# Patient Record
Sex: Female | Born: 1965
Health system: Southern US, Community
[De-identification: ages and names within clinical notes are randomized; demographics above are authoritative.]

## PROBLEM LIST (undated history)

## (undated) DIAGNOSIS — M1990 Unspecified osteoarthritis, unspecified site: Secondary | ICD-10-CM

## (undated) DIAGNOSIS — E119 Type 2 diabetes mellitus without complications: Secondary | ICD-10-CM

## (undated) DIAGNOSIS — R0789 Other chest pain: Secondary | ICD-10-CM

## (undated) DIAGNOSIS — I10 Essential (primary) hypertension: Secondary | ICD-10-CM

## (undated) HISTORY — DX: Unspecified osteoarthritis, unspecified site: M19.90

## (undated) HISTORY — PX: OTHER SURGICAL HISTORY: SHX169

---

## 2007-06-23 ENCOUNTER — Other Ambulatory Visit: Admission: RE | Admit: 2007-06-23 | Discharge: 2007-06-23 | Payer: Self-pay | Admitting: Obstetrics and Gynecology

## 2007-07-29 ENCOUNTER — Emergency Department (HOSPITAL_COMMUNITY): Admission: EM | Admit: 2007-07-29 | Discharge: 2007-07-29 | Payer: Self-pay | Admitting: Emergency Medicine

## 2007-10-17 ENCOUNTER — Emergency Department (HOSPITAL_COMMUNITY): Admission: EM | Admit: 2007-10-17 | Discharge: 2007-10-17 | Payer: Self-pay | Admitting: Emergency Medicine

## 2008-12-21 ENCOUNTER — Inpatient Hospital Stay (HOSPITAL_COMMUNITY): Admission: EM | Admit: 2008-12-21 | Discharge: 2008-12-22 | Payer: Self-pay | Admitting: Emergency Medicine

## 2009-02-07 ENCOUNTER — Emergency Department (HOSPITAL_COMMUNITY): Admission: EM | Admit: 2009-02-07 | Discharge: 2009-02-07 | Payer: Self-pay | Admitting: Emergency Medicine

## 2009-04-01 ENCOUNTER — Emergency Department (HOSPITAL_COMMUNITY): Admission: EM | Admit: 2009-04-01 | Discharge: 2009-04-01 | Payer: Self-pay | Admitting: Emergency Medicine

## 2009-06-06 ENCOUNTER — Ambulatory Visit (HOSPITAL_COMMUNITY): Admission: RE | Admit: 2009-06-06 | Discharge: 2009-06-06 | Payer: Self-pay | Admitting: General Surgery

## 2009-06-20 ENCOUNTER — Ambulatory Visit (HOSPITAL_COMMUNITY): Admission: RE | Admit: 2009-06-20 | Discharge: 2009-06-20 | Payer: Self-pay | Admitting: General Surgery

## 2009-09-28 ENCOUNTER — Encounter: Admission: RE | Admit: 2009-09-28 | Discharge: 2009-09-28 | Payer: Self-pay | Admitting: General Surgery

## 2009-11-14 ENCOUNTER — Ambulatory Visit (HOSPITAL_COMMUNITY): Admission: RE | Admit: 2009-11-14 | Discharge: 2009-11-14 | Payer: Self-pay | Admitting: General Surgery

## 2009-11-21 ENCOUNTER — Ambulatory Visit: Payer: Self-pay | Admitting: Family Medicine

## 2010-05-21 ENCOUNTER — Encounter: Payer: Self-pay | Admitting: General Surgery

## 2010-08-01 LAB — POCT I-STAT, CHEM 8
BUN: 6 mg/dL (ref 6–23)
Calcium, Ion: 1.11 mmol/L — ABNORMAL LOW (ref 1.12–1.32)
Chloride: 98 mEq/L (ref 96–112)
Creatinine, Ser: 0.6 mg/dL (ref 0.4–1.2)
Potassium: 3.2 mEq/L — ABNORMAL LOW (ref 3.5–5.1)
Sodium: 137 mEq/L (ref 135–145)

## 2010-08-01 LAB — POCT CARDIAC MARKERS
CKMB, poc: <1 ng/mL — ABNORMAL LOW (ref 1.0–8.0)
Myoglobin, poc: 43.2 ng/mL (ref 12–200)
Troponin i, poc: <0.05 ng/mL (ref 0.00–0.09)

## 2010-08-01 LAB — GLUCOSE, CAPILLARY: Glucose-Capillary: 383 mg/dL — ABNORMAL HIGH (ref 70–99)

## 2010-08-05 LAB — CARDIAC PANEL(CRET KIN+CKTOT+MB+TROPI)
CK, MB: 1 ng/mL (ref 0.3–4.0)
Relative Index: 0.7 (ref 0.0–2.5)
Total CK: 149 U/L (ref 7–177)
Total CK: 163 U/L (ref 7–177)
Troponin I: 0.02 ng/mL (ref 0.00–0.06)

## 2010-08-05 LAB — GLUCOSE, CAPILLARY
Glucose-Capillary: 314 mg/dL — ABNORMAL HIGH (ref 70–99)
Glucose-Capillary: 327 mg/dL — ABNORMAL HIGH (ref 70–99)

## 2010-08-05 LAB — CBC
HCT: 42.2 % (ref 36.0–46.0)
MCV: 84.6 fL (ref 78.0–100.0)
RBC: 4.98 MIL/uL (ref 3.87–5.11)
RDW: 12.7 % (ref 11.5–15.5)
WBC: 8.1 10*3/uL (ref 4.0–10.5)

## 2010-08-05 LAB — MAGNESIUM: Magnesium: 1.6 mg/dL (ref 1.5–2.5)

## 2010-08-05 LAB — DIFFERENTIAL
Basophils Absolute: 0.1 10*3/uL (ref 0.0–0.1)
Basophils Relative: 1 % (ref 0–1)
Eosinophils Absolute: 0.1 10*3/uL (ref 0.0–0.7)
Eosinophils Relative: 1 % (ref 0–5)
Lymphocytes Relative: 29 % (ref 12–46)
Monocytes Relative: 7 % (ref 3–12)
Neutro Abs: 4.9 10*3/uL (ref 1.7–7.7)
Neutrophils Relative %: 62 % (ref 43–77)

## 2010-08-05 LAB — BASIC METABOLIC PANEL
CO2: 28 mEq/L (ref 19–32)
Calcium: 8.7 mg/dL (ref 8.4–10.5)
GFR calc Af Amer: >60 mL/min (ref >60)
GFR calc non Af Amer: >60 mL/min (ref >60)
Potassium: 2.7 mEq/L — CL (ref 3.5–5.1)
Sodium: 137 mEq/L (ref 135–145)

## 2010-08-05 LAB — URINALYSIS, ROUTINE W REFLEX MICROSCOPIC
Bilirubin Urine: NEGATIVE
Nitrite: NEGATIVE
Protein, ur: NEGATIVE mg/dL
Specific Gravity, Urine: 1.031 — ABNORMAL HIGH (ref 1.005–1.030)
pH: 6 (ref 5.0–8.0)

## 2010-08-05 LAB — URINE CULTURE

## 2010-08-05 LAB — URINE MICROSCOPIC-ADD ON

## 2010-08-05 LAB — POCT I-STAT, CHEM 8
BUN: 12 mg/dL (ref 6–23)
Calcium, Ion: 1.14 mmol/L (ref 1.12–1.32)
Creatinine, Ser: 0.9 mg/dL (ref 0.4–1.2)
Glucose, Bld: 341 mg/dL — ABNORMAL HIGH (ref 70–99)
HCT: 44 % (ref 36.0–46.0)

## 2010-08-05 LAB — BRAIN NATRIURETIC PEPTIDE: Pro B Natriuretic peptide (BNP): <30 pg/mL (ref 0.0–100.0)

## 2010-08-05 LAB — HEMOGLOBIN A1C: Mean Plasma Glucose: 269 mg/dL

## 2010-08-05 LAB — POCT CARDIAC MARKERS: Myoglobin, poc: 52.7 ng/mL (ref 12–200)

## 2010-09-12 NOTE — H&P (Signed)
NAME:  Latoya Cox, Latoya Cox NO.:  [id number]   MEDICAL RECORD NO.:  [id number]          PATIENT TYPE:  EMS   LOCATION:  ED                           FACILITY:  [facility]   PHYSICIAN:  Hollice Espy, M.D.DATE OF BIRTH:  [birth year]   DATE OF ADMISSION:  12/21/2008  DATE OF DISCHARGE:                              HISTORY & PHYSICAL   CHIEF COMPLAINT:  Right-sided chest pain.   HISTORY:  Ms. Philbert is a 45 year old African American female with a  history of hypertension and diabetes.  She awakened this morning around  6 a.m. with right-sided chest pain that she described as a pressure.  She acknowledges nausea during this episode, but denies vomiting,  dizziness, shortness of breath, any radiating pain.  She does note that  she gets some relief while in the sitting position versus lying down.  After about 30 minutes of this continuous pain in her chest, her husband  and she came to the emergency room.  She did get some morphine which  gave her good relief from the pain.  She also received a nitro which did  not seem to alleviate the pain at all.  We are asked, [facility],  to admit for evaluation and treatment of chest pain and hyperglycemia.  Her blood glucose on arrival was 341.   ALLERGIES:  NO KNOWN DRUG ALLERGIES.   PAST MEDICAL HISTORY:  1. Hypertension.  2. Diabetes type 2.   PAST SURGICAL HISTORY:  She had aneurysm clipping in 2000.   FAMILY HISTORY:  Mother is deceased at 65 years old with a history of  hypertension, diabetes and stroke.  Father deceased at age 74 with a  history of diabetes, hypertension and peripheral vascular disease.  She  has 2 living sisters, both have hypertension.  One living brother who  has diabetes.   SOCIAL HISTORY:  She is negative for tobacco and ETOH.  She is married  and lives with her husband and one 52-year-old daughter.   HOME MEDICATIONS:  1. Glimepiride 4 mg one tab q.a.m.  2. Clonidine 0.2 mg one tab  q.h.s.  3. HCTZ 25 mg one tab daily.  4. Onglyza 5 mg p.o. one tab daily.  5. Klor-Con M20 p.o. 1.5 tabs daily.  6. Bystolic 10 mg p.o. one tab daily.  7. Exforge 10/320/25 mg one tab daily.   REVIEW OF SYSTEMS:  GENERAL:  No fever.  No chills.  ENT:  Positive for  sinus congestion.  Negative for sore throat.  CV:  Positive for  occasional lower extremity edema.  Negative for palpitations.  See the  HPI for the remaining.  RESPIRATORY:  Negative shortness of breath.  Negative cough.  NEURO:  Negative headache.  Negative dizziness.  Negative visual disturbances.  Negative numbness or tingling of  extremities.  GI:  Negative for abdominal pain.  Negative diarrhea.  Negative constipation.  Negative blood in stool.  Positive for currently  on her menstrual cycle.  GU:  Negative dysuria.  No blood in the urine.  No frequency.  No urgency.  MUSCULOSKELETAL:  Negative for joint  pain.  Negative for muscle weakness.  PSYCH:  Negative for depression.  Negative anxiety.  SKIN:  No rashes or lesions.   LABORATORY DATA:  CBC with a white count of 8.1, hemoglobin 14.1,  hematocrit 42.2, platelets large amount, sodium 139, potassium 2.7,  chloride 98, BUN 12, creatinine 0.9, glucose 341.  UA was positive for  greater than 1,000 glucose, trace ketones, negative for nitrites, WBC 3-  6, RBC 11-12, many bacteria.  Negative pregnancy.  Myoglobin 52.7, CK-MB  less than 1.0, troponin 0.05.   DIAGNOSTICS:  Chest x-ray shows cardiomegaly and pulmonary venous  hypertension.   PHYSICAL EXAMINATION:  VITAL SIGNS:  Temperature 98.1, blood pressure  119/82, heart rate 83, respirations 20, pulse ox 100% on room air.  GENERAL APPEARANCE:  Well-nourished, pleasant in no acute distress.  HEENT:  Head is normocephalic, atraumatic.  CV:  Rate regular.  No gallop.  No murmur.  No lower extremity edema.  RESPIRATORY:  Nonlabored.  Clear to auscultation bilaterally with no  rales.  No wheezes.  NEURO:  Speech clear.   Facial symmetry.  Alert and oriented x3.  GI:  Obese abdomen, soft, nontender.  Positive bowel sounds x4.  MS:  Moves all extremities.  Negative joint swelling.  Full range of  motion.  SKIN:  Warm and dry with no lesions or rashes.  PSYCH:  Cooperative, pleasant, alert and oriented x3.   ASSESSMENT/PLAN:  1. Atypical chest pain.  Chest pain atypical, but with risk factors      and increased pulmonary hypertension.  We will admit to telemetry.      We will cycle enzymes q.8 h. x3.  We will check a BMP and a D-dimer      to rule out PE.  Continue oxygen and provide morphine p.r.n. for      pain as well as nitro.  2. Uncontrolled diabetes of unknown etiology.  No fever.  Negative      white count.  Question compliance with her home management.  We      will hydrate.  We will monitor and treat using the glycemic control      order set.  We will check an A1c.  Start Lantus 5 units      subcutaneous at bedtime.  Check her CBGs t.i.d. with meals.  Carb      modified diet.  Consider a diabetic consult.  We are going to start      metformin 500 as well as the oral agents from home.  3. Hypokalemia, probably secondary to her hypertension medications and      increased glucose level.  We will replete.  We will check a basic      metabolic panel in the morning.  We will hold her antihypertensives      for now.  4. Pulmonary hypertension.  Chest x-ray reveals pulmonary venous      hypertension.  We will check a BMP as well as a D-dimer to rule out      PE.  Her systolic blood pressure range currently is 113-98.  Her      diastolic blood pressure range is 82-58.  We will hold her      antihypertensive for now.     ______________________________  Toya Smothers, NP      Hollice Espy, M.D.  Electronically Signed   KB/MEDQ  D:  12/21/2008  T:  12/21/2008  Job:  [id number]

## 2010-10-18 ENCOUNTER — Emergency Department (HOSPITAL_COMMUNITY): Payer: PRIVATE HEALTH INSURANCE

## 2010-10-18 ENCOUNTER — Observation Stay (HOSPITAL_COMMUNITY)
Admission: EM | Admit: 2010-10-18 | Discharge: 2010-10-19 | Disposition: A | Discharge status: Home / Self Care | Payer: PRIVATE HEALTH INSURANCE | Attending: Internal Medicine | Admitting: Internal Medicine

## 2010-10-18 DIAGNOSIS — IMO0001 Reserved for inherently not codable concepts without codable children: Secondary | ICD-10-CM | POA: Insufficient documentation

## 2010-10-18 DIAGNOSIS — Z79899 Other long term (current) drug therapy: Secondary | ICD-10-CM | POA: Insufficient documentation

## 2010-10-18 DIAGNOSIS — I10 Essential (primary) hypertension: Secondary | ICD-10-CM | POA: Insufficient documentation

## 2010-10-18 DIAGNOSIS — R0789 Other chest pain: Principal | ICD-10-CM | POA: Insufficient documentation

## 2010-10-18 DIAGNOSIS — J309 Allergic rhinitis, unspecified: Secondary | ICD-10-CM | POA: Insufficient documentation

## 2010-10-18 DIAGNOSIS — E876 Hypokalemia: Secondary | ICD-10-CM | POA: Insufficient documentation

## 2010-10-18 DIAGNOSIS — E669 Obesity, unspecified: Secondary | ICD-10-CM | POA: Insufficient documentation

## 2010-10-18 DIAGNOSIS — J329 Chronic sinusitis, unspecified: Secondary | ICD-10-CM | POA: Insufficient documentation

## 2010-10-18 DIAGNOSIS — E785 Hyperlipidemia, unspecified: Secondary | ICD-10-CM | POA: Insufficient documentation

## 2010-10-18 DIAGNOSIS — K219 Gastro-esophageal reflux disease without esophagitis: Secondary | ICD-10-CM | POA: Insufficient documentation

## 2010-10-18 LAB — URINALYSIS, ROUTINE W REFLEX MICROSCOPIC
Hgb urine dipstick: NEGATIVE
Protein, ur: NEGATIVE mg/dL
Urobilinogen, UA: 1 mg/dL (ref 0.0–1.0)

## 2010-10-18 LAB — BASIC METABOLIC PANEL
Calcium: 8.8 mg/dL (ref 8.4–10.5)
GFR calc non Af Amer: >60 mL/min (ref >60)
Glucose, Bld: 247 mg/dL — ABNORMAL HIGH (ref 70–99)
Sodium: 137 mEq/L (ref 135–145)

## 2010-10-18 LAB — CK TOTAL AND CKMB (NOT AT ARMC)
CK, MB: 2.3 ng/mL (ref 0.3–4.0)
Relative Index: 1.2 (ref 0.0–2.5)
Relative Index: 1.3 (ref 0.0–2.5)

## 2010-10-18 LAB — URINE MICROSCOPIC-ADD ON

## 2010-10-18 LAB — CBC
HCT: 37.6 % (ref 36.0–46.0)
Hemoglobin: 12.7 g/dL (ref 12.0–15.0)
RBC: 4.65 MIL/uL (ref 3.87–5.11)
WBC: 9.6 10*3/uL (ref 4.0–10.5)

## 2010-10-18 LAB — TROPONIN I: Troponin I: <0.3 ng/mL (ref <0.30)

## 2010-10-18 LAB — POCT PREGNANCY, URINE: Preg Test, Ur: NEGATIVE

## 2010-10-19 LAB — CARDIAC PANEL(CRET KIN+CKTOT+MB+TROPI)
CK, MB: 2.2 ng/mL (ref 0.3–4.0)
Troponin I: <0.3 ng/mL (ref <0.30)

## 2010-10-19 LAB — COMPREHENSIVE METABOLIC PANEL
ALT: 17 U/L (ref 0–35)
AST: 20 U/L (ref 0–37)
Albumin: 3.2 g/dL — ABNORMAL LOW (ref 3.5–5.2)
CO2: 27 mEq/L (ref 19–32)
Calcium: 8.5 mg/dL (ref 8.4–10.5)
Sodium: 139 mEq/L (ref 135–145)
Total Protein: 6.6 g/dL (ref 6.0–8.3)

## 2010-10-19 LAB — LIPID PANEL
HDL: 29 mg/dL — ABNORMAL LOW (ref >39)
LDL Cholesterol: 79 mg/dL (ref 0–99)
Triglycerides: 128 mg/dL (ref <150)
VLDL: 26 mg/dL (ref 0–40)

## 2010-10-19 LAB — CBC
MCH: 27.7 pg (ref 26.0–34.0)
MCHC: 34.1 g/dL (ref 30.0–36.0)
Platelets: 234 10*3/uL (ref 150–400)
RBC: 4.8 MIL/uL (ref 3.87–5.11)

## 2010-10-19 NOTE — H&P (Signed)
NAME:  JOSEPHENE, MARRONE NO.:  [id number]  MEDICAL RECORD NO.:  [id number]  LOCATION:  WLED                         FACILITY:  [facility]  PHYSICIAN:  Rosanna Randy, MDDATE OF BIRTH:  1966-01-23  DATE OF ADMISSION:  10/18/2010 DATE OF DISCHARGE:                             HISTORY & PHYSICAL   PRIMARY CARE PHYSICIAN:  [facility].  CHIEF COMPLAINT:  Chest pain.  HISTORY OF PRESENT ILLNESS:  Ms. Riede is a 45 year old female with a past medical history significant for hypertension, uncontrolled diabetes, hyperlipidemia and obesity who came into the hospital complaining of chest pain.  The patient reported that for the last 3 days she had been complaining of a sinusitis, postnasal drip and also cold symptoms associated with cough which have been going on for the same amount of time.  She has been in severe sudden midsternal chest pain early this morning around 9:00 a.m. which was radiated to the right side of her chest without any diaphoresis, nausea, vomiting but associated with a tachycardia.  The patient's pain improved after she received aspirin and morphine given in the emergency department.  Chest x-ray that was also done demonstrated no acute cardiopulmonary process. [facility] was called in order to admit the patient due to her risk factors in order to have ACS rule out.  ALLERGIES:  There is no known drug allergies.  PAST MEDICAL HISTORY:  Significant for hypertension, diabetes, hyperlipidemia, obesity, seasonal allergic rhinitis, insomnia.  MEDICATIONS: 1. Fish oil over-the-counter 1 tablet by mouth daily. 2. Aspirin 81 mg by mouth daily. 3. Multivitamin 1 tablet by mouth daily. 4. Potassium 20 mEq 3 tablets by mouth daily. 5. Clonazepam 1 mg by mouth daily at bedtime as needed to fall asleep.  MEDICATIONS: 1. Nebivolol 10 mg by mouth 1 tablet daily. 2. Amlodipine/valsartan 10/320 one tablet by mouth daily. 3.  Saxagliptin 5 mg 1 tablet by mouth daily. 4. Lantus 20 units subcutaneously twice a day.  SOCIAL HISTORY:  The patient denies any tobacco, any alcohol, and any recreational drugs.  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  FAMILY HISTORY:  Significant for hypertension and diabetes.  REVIEW OF SYSTEMS:  Positive for sinus congestion and mild erythema on her throat that could be secondary to postnasal drip, otherwise, as already mentioned on HPI.  PHYSICAL EXAMINATION:  VITAL SIGNS:  Temperature of 97.5, blood pressure 145/103, heart rate 18, respiratory rate 16, oxygen saturation 98% on room air. GENERAL:  In general, the patient was lying in bed, in no acute distress, well nourished, well hydrated, overweight. HEAD AND FACE:  Normocephalic.  No trauma. EYES:  Normal appearance.  Extraocular muscles intact.  PERRLA.  No icterus.  No nystagmus. EARS, NOSE AND THROAT:  Mouth and pharynx were normal.  Uvula midline. There was no erythema.  No exudates.  Good dentition. NECK:  Supple.  Full range of motion.  No thyromegaly. CARDIOVASCULAR:  Regular rate and rhythm.  No murmurs, gallops or rubs. Normal S1, S2. RESPIRATORY SYSTEM:  Normal with clear breath  sounds bilaterally.  No rales, rhonchi, wheezes or rubs. ABDOMEN:  Soft, nontender, nondistended.  Positive bowel sounds. EXTREMITIES:  Normal.  No abnormalities.  No deformity.  Full range of motion.  Pulses were normal bilaterally.  There was no tenderness.  No edema, cyanosis or clubbing. NEUROLOGIC EXAM:  The patient was alert and oriented x3.  Cranial nerves II through XII intact.  Muscle strength 5/5 bilaterally.  Normal sensation.  Normal gait.  Normal finger-to-nose and heel-to-shin. SKIN:  Color was normal.  No rash, no petechiae.  There was no lymphadenopathy. PSYCH:   Appropriate.  PERTINENT LABORATORY DATA:  The patient has a CBC without differential that showed white blood cells of 9.6, hemoglobin 12.7, platelets 228. Cardiac markers demonstrated a CK total of 185, CK-MB 2.3 with a relative index of 1.2, troponin less than 0.30.  Pregnancy in urine was negative. BMET showed a sodium of 137, potassium 2.9, chloride 100, bicarb 26, glucose 247, BUN 8, creatinine 0.59.  Urinalysis was normal. Urine microscopy normal.  Second set demonstrated a CK of 175 with CK-MB of 2.2, relative index of 1.3, troponin less than 0.30.  The patient had a chest x-ray that demonstrated no active disease.  ASSESSMENT AND PLAN.: 1. Chest pain, atypical, at this point most likely secondary to     costochondritis versus gastroesophageal reflux disease.  Since     after I was interrogating the patient, she also brought some reflux     type of symptoms into the history saying that she had sometimes a     morning sensation.  This is not the first time that she had this     type of discomfort and previously she was evaluated and was found     not to have any acute heart problems.  Due to her risk factors,     nonetheless, we are going to admit the patient for observation on     telemetry.  We are going to cycle cardiac enzymes and EKG and     depending on results, we might need to get in touch with cardiology     for further risk stratification.  We are going to check a fasting     lipid profile and TSH.  We will we will go ahead and repeat also     the patient's hemoglobin A1c as part of the stratification process.     We are going to continue the patient's medication for blood     pressure.  We are going to start her on Protonix 40 mg by mouth     daily.  We are going to continue aspirin and we are going to put     her on a heart healthy diet.  We are going to use morphine 1 to 2     mg by mouth q.4 h as needed to control her pain.  We will also go     ahead and start  treatment for her sinusitis so we can actually     avoid further coughing and by that also helping with the chest     pain.  Further treatment of this problem depending on the patient's     evolution. 2. Hypertension.  At this moment we are going to continue the use of     amlodipine, valsartan and nebivolol.  Further adjustment depending     response of her blood pressure to this medication. 3. Hypokalemia.  We are going to check a magnesium level and we are  going to replete her potassium most likely secondary to the use of     her medications for blood pressure at home.  There is no history of     nausea, vomiting or any other abnormalities. 4. Hyperlipidemia.  We are going to check a fasting lipid profile.     Depending on results, we are going to start the patient if needed     on statins for now.  We are going to continue just her fish oil. 5. Sinusitis.  We are going to start the patient on loratadine 10 mg     by mouth daily. 6. Obesity.  We are going to recommend a low-calorie diet and we are     going to involve dietitian in order to provide further     instructions on how to help her losing some weight. 7. The patient's diabetes.  We are going to check hemoglobin A1c.  We     are going to continue her Lantus and at this moment we are going to     hold off on the Onglyza and metformin while using regular insulin     with a sliding scale just in case that the patient will not require     any contrast study and at the moment if she does not need it, then     we will go ahead and restart her home medications at discharge.     She will probably benefit of being full dose of metformin 1000 mg     by mouth twice a day but it is something that we are going to let     the patient's primary care physician to decide on. 8. For DVT prophylaxis, we are going to start the patient on Lovenox.     Rosanna Randy, MD     CEM/MEDQ  D:  10/18/2010  T:  10/18/2010  Job:   [id number]  cc:   [facility]  Electronically Signed by Vassie Loll MD on 10/19/2010 11:14:28 AM

## 2010-10-31 NOTE — Discharge Summary (Signed)
NAMERUCHA, WISSINGER NO.:  [id number]  MEDICAL RECORD NO.:  [id number]  LOCATION:  1444                         FACILITY:  [facility]  PHYSICIAN:  Erick Blinks, MD     DATE OF BIRTH:  [birth year]  DATE OF ADMISSION:  10/18/2010 DATE OF DISCHARGE:  10/19/2010                              DISCHARGE SUMMARY   PRIMARY CARE PHYSICIAN:  Dr. Evalee Jefferson, internal medicine.  ENDOCRINOLOGIST:  Dr. Katrinka Blazing.  DISCHARGE DIAGNOSES: 1. Atypical chest pain likely secondary to upper respiratory tract     viral syndrome. 2. Gastroesophageal reflux disease. 3. Hypertension. 4. Uncontrolled diabetes. 5. Obesity. 6. Allergic rhinitis. 7. Hypokalemia.  DISCHARGE MEDICATIONS: 1. Protonix 40 mg p.o. daily. 2. Loratadine 10 mg p.o. daily. 3. Lantus 30 units subcutaneous b.i.d. 4. Saxagliptin 5 mg 1 tablet p.o. daily. 5. Exforge 10/320 mg 1 tablet p.o. daily. 6. Bystolic 10 mg 1 tablet p.o. daily. 7. Clonazepam 1 mg p.o. q.h.s. p.r.n. 8. Potassium chloride 20 mEq 3 tablets p.o. daily. 9. Fish oil over-the-counter 1 tablet p.o. daily. 10.Multivitamins 1 tablet p.o. daily. 11.Aspirin 81 mg p.o. daily.  ADMISSION HISTORY:  This is a 45 year old African-American female, who presents to the Emergency Room with complaints of chest pain.  Patient described the postnasal drip cough and sinusitis since Saturday.  She had noticed onset of chest pain, which occurred at rest.  She noticed some more while coughing.  She was brought to the ER for evaluation and due to her risk factors was admitted for rule out ACS.  For further details, please refer the history and physical by Dr. Gwenlyn Perking.  HOSPITAL COURSE: 1. Chest pain:  Patient has no further chest pain while here in the     hospital.  Her cardiac enzymes were negative x3.  Her EKG does not     show any new changes, although it did show diffuse T-wave     inversions.  These are unchanged since 2011.  She has had no     further pain  while here in the hospital.  Chest x-ray does not show     any acute disease.  It is felt that her pain is almost likely     secondary to the viral pleuritis.  Recent upper respiratory tract     infection.  The patient also reports having a stress test within     the last 1-2 years which was reported to be normal.  It is felt     appropriate to discharge the patient at this time and have a follow-     up with primary care physician with possible referral to cardiology     is felt appropriate i.e., if symptoms recur. 2. Uncontrolled diabetes:  Patient does report some noncompliance with     her Lantus.  She does see an endocrinologist as an outpatient.     Patient was strongly advised to be compliant with her medication.     She will be set up with outpatient diabetes education.  Her A1c was     found to be elevated at 9.8.  We will increase her Lantus from 20     b.i.d. to 30 b.i.d. and  continue her oral hypoglycemics.  She would     also monitor her sugars at home and follow up with her     endocrinologist within the week. 3. The remainder of the patient's medical issues have remained stable.  DIAGNOSTIC IMAGING:  Chest x-ray from October 18, 2010 shows no active disease in one-view.  DISCHARGE INSTRUCTIONS:  The patient should follow up with her primary care physician in the next 1-2 weeks.  She will also need to see her endocrinologist in the next 1-2 weeks.  She has been advised to schedule this appointment.  She should continue on heart-healthy low-calorie diet and conduct her activity as tolerated.  CONDITION AT TIME OF DISCHARGE:  Improved.     Erick Blinks, MD     JM/MEDQ  D:  10/19/2010  T:  10/19/2010  Job:  [id number]  Electronically Signed by Durward Mallard MEMON  on 10/31/2010 05:43:20 PM

## 2012-01-16 ENCOUNTER — Emergency Department (HOSPITAL_COMMUNITY)
Admission: EM | Admit: 2012-01-16 | Discharge: 2012-01-16 | Disposition: A | Discharge status: Home / Self Care | Payer: PRIVATE HEALTH INSURANCE | Attending: Emergency Medicine | Admitting: Emergency Medicine

## 2012-01-16 ENCOUNTER — Emergency Department (HOSPITAL_COMMUNITY): Payer: PRIVATE HEALTH INSURANCE

## 2012-01-16 DIAGNOSIS — Z79899 Other long term (current) drug therapy: Secondary | ICD-10-CM | POA: Insufficient documentation

## 2012-01-16 DIAGNOSIS — R071 Chest pain on breathing: Secondary | ICD-10-CM | POA: Insufficient documentation

## 2012-01-16 DIAGNOSIS — R0789 Other chest pain: Secondary | ICD-10-CM

## 2012-01-16 DIAGNOSIS — Z7982 Long term (current) use of aspirin: Secondary | ICD-10-CM | POA: Insufficient documentation

## 2012-01-16 LAB — BASIC METABOLIC PANEL
CO2: 27 mEq/L (ref 19–32)
Calcium: 9.8 mg/dL (ref 8.4–10.5)
Creatinine, Ser: 0.66 mg/dL (ref 0.50–1.10)
Glucose, Bld: 159 mg/dL — ABNORMAL HIGH (ref 70–99)

## 2012-01-16 LAB — CBC
Hemoglobin: 14.6 g/dL (ref 12.0–15.0)
MCH: 27.8 pg (ref 26.0–34.0)
MCV: 81 fL (ref 78.0–100.0)
RBC: 5.26 MIL/uL — ABNORMAL HIGH (ref 3.87–5.11)

## 2012-01-16 MED ORDER — METHOCARBAMOL 750 MG PO TABS: 750.0000 mg | ORAL_TABLET | Freq: Four times a day (QID) | ORAL | Status: DC

## 2012-01-16 MED ORDER — DIAZEPAM 5 MG PO TABS
5.0000 mg | ORAL_TABLET | Freq: Once | ORAL | Status: AC
  Administered 2012-01-16: 5 mg via ORAL
  Filled 2012-01-16: qty 1

## 2012-01-16 MED ORDER — KETOROLAC TROMETHAMINE 60 MG/2ML IM SOLN
60.0000 mg | Freq: Once | INTRAMUSCULAR | Status: AC
  Administered 2012-01-16: 60 mg via INTRAMUSCULAR
  Filled 2012-01-16: qty 2

## 2012-01-16 MED ORDER — IBUPROFEN 600 MG PO TABS: 600.0000 mg | ORAL_TABLET | Freq: Four times a day (QID) | ORAL | Status: DC | PRN

## 2012-01-16 NOTE — ED Provider Notes (Signed)
History     CSN: [id number]  Arrival date & time 01/16/12  1639   First MD Initiated Contact with Patient 01/16/12 1853      Chief Complaint  Patient presents with  . Chest Pain    (Consider location/radiation/quality/duration/timing/severity/associated sxs/prior treatment) Patient is a 46 y.o. female presenting with chest pain. The history is provided by the patient and the spouse.  Chest Pain    patient here with right-sided chest pain that began today which is described as sharp and worse with certain movements. Denies any fever or cough. Denies any history of trauma. No leg pain or swelling. Denies any history of PE. Pain is nonanginal. No medications taken for this prior to arrival. The pain is better without moving  No past medical history on file.  No past surgical history on file.  No family history on file.  History  Substance Use Topics  . Smoking status: Not on file  . Smokeless tobacco: Not on file  . Alcohol Use: Not on file    OB History    No data available      Review of Systems  Cardiovascular: Positive for chest pain.  All other systems reviewed and are negative.    Allergies  Review of patient's allergies indicates no known allergies.  Home Medications   Current Outpatient Rx  Name Route Sig Dispense Refill  . AMLODIPINE BESYLATE-VALSARTAN 10-320 MG PO TABS Oral Take 1 tablet by mouth daily.    . ASPIRIN 81 MG PO CHEW Oral Chew 81 mg by mouth daily.    Marland Kitchen CLONIDINE HCL 0.1 MG PO TABS Oral Take 0.1 mg by mouth at bedtime.    . OMEGA-3 FATTY ACIDS 1000 MG PO CAPS Oral Take 1 g by mouth daily.    . INSULIN GLARGINE 100 UNIT/ML [place] SOLN Subcutaneous Inject 35 Units into the skin at bedtime.    Marland Kitchen LIRAGLUTIDE 18 MG/3ML  SOLN Subcutaneous Inject 1.8 mg into the skin daily.    Marland Kitchen LORATADINE 10 MG PO TABS Oral Take 10 mg by mouth daily.    Marland Kitchen METFORMIN HCL ER 750 MG PO TB24 Oral Take 750 mg by mouth daily with breakfast.    . ADULT MULTIVITAMIN  W/MINERALS CH Oral Take 1 tablet by mouth daily.    . NEBIVOLOL HCL 10 MG PO TABS Oral Take 10 mg by mouth daily.    . NEBIVOLOL HCL 20 MG PO TABS Oral Take 20 mg by mouth daily.    Marland Kitchen POTASSIUM CHLORIDE 20 MEQ PO PACK Oral Take 80 mEq by mouth daily.      BP 134/102  Pulse 92  Temp 97.8 F (36.6 C) (Oral)  Resp 16  SpO2 97%  LMP 01/15/2012  Physical Exam  Nursing note and vitals reviewed. Constitutional: She is oriented to person, place, and time. She appears well-developed and well-nourished.  Non-toxic appearance. No distress.  HENT:  Head: Normocephalic and atraumatic.  Eyes: Conjunctivae normal, EOM and lids are normal. Pupils are equal, round, and reactive to light.  Neck: Normal range of motion. Neck supple. No tracheal deviation present. No mass present.  Cardiovascular: Normal rate, regular rhythm and normal heart sounds.  Exam reveals no gallop.   No murmur heard. Pulmonary/Chest: Effort normal and breath sounds normal. No stridor. No respiratory distress. She has no decreased breath sounds. She has no wheezes. She has no rhonchi. She has no rales.  Abdominal: Soft. Normal appearance and bowel sounds are normal. She exhibits no distension. There  is no tenderness. There is no rebound and no CVA tenderness.  Musculoskeletal: Normal range of motion. She exhibits no edema and no tenderness.  Neurological: She is alert and oriented to person, place, and time. She has normal strength. No cranial nerve deficit or sensory deficit. GCS eye subscore is 4. GCS verbal subscore is 5. GCS motor subscore is 6.  Skin: Skin is warm and dry. No abrasion and no rash noted.  Psychiatric: She has a normal mood and affect. Her speech is normal and behavior is normal.    ED Course  Procedures (including critical care time)   Labs Reviewed  POCT I-STAT TROPONIN I  CBC  BASIC METABOLIC PANEL   Dg Chest 2 View  01/16/2012  *RADIOLOGY REPORT*  Clinical Data: Chest pain  CHEST - 2 VIEW   Comparison: 10/18/2010  Findings: The heart size and mediastinal contours are within normal limits.  Both lungs are clear.  The visualized skeletal structures are unremarkable.  IMPRESSION: Negative examination.   Original Report Authenticated By: Rosealee Albee, M.D.      No diagnosis found.    MDM   Date: 01/16/2012  Rate: 85  Rhythm: normal sinus rhythm  QRS Axis: normal  Intervals: normal  ST/T Wave abnormalities: nonspecific T wave changes  Conduction Disutrbances:none  Narrative Interpretation:   Old EKG Reviewed: unchanged  Pt is PERC negative  8:01 PM Patient given medications here feels better. No concern for patient having ACS. Suspect chest wall pain and she is now stable for discharge to      Toy Baker, MD 01/16/12 2002

## 2012-01-16 NOTE — ED Notes (Signed)
Report sudden onset of chest pain started 1400 today, pin 9/10 non radiating with nausea, but no vomiting nor SOB. Pain is worsen at touch and exacerbate by movement. Pt in no acute distress, no diaphoresis noted. Sinus rhythm noted on Monitor. EKG obtained, will show to MD

## 2013-05-25 ENCOUNTER — Emergency Department (HOSPITAL_COMMUNITY): Payer: PRIVATE HEALTH INSURANCE

## 2013-05-25 ENCOUNTER — Emergency Department (HOSPITAL_COMMUNITY)
Admission: EM | Admit: 2013-05-25 | Discharge: 2013-05-25 | Disposition: A | Discharge status: Home / Self Care | Payer: PRIVATE HEALTH INSURANCE | Attending: Emergency Medicine | Admitting: Emergency Medicine

## 2013-05-25 ENCOUNTER — Encounter (HOSPITAL_COMMUNITY): Payer: Self-pay | Admitting: Emergency Medicine

## 2013-05-25 DIAGNOSIS — R0789 Other chest pain: Secondary | ICD-10-CM | POA: Insufficient documentation

## 2013-05-25 DIAGNOSIS — Z7982 Long term (current) use of aspirin: Secondary | ICD-10-CM | POA: Insufficient documentation

## 2013-05-25 DIAGNOSIS — IMO0002 Reserved for concepts with insufficient information to code with codable children: Secondary | ICD-10-CM | POA: Insufficient documentation

## 2013-05-25 DIAGNOSIS — I10 Essential (primary) hypertension: Secondary | ICD-10-CM | POA: Insufficient documentation

## 2013-05-25 DIAGNOSIS — E119 Type 2 diabetes mellitus without complications: Secondary | ICD-10-CM | POA: Insufficient documentation

## 2013-05-25 DIAGNOSIS — Z79899 Other long term (current) drug therapy: Secondary | ICD-10-CM | POA: Insufficient documentation

## 2013-05-25 DIAGNOSIS — Z794 Long term (current) use of insulin: Secondary | ICD-10-CM | POA: Insufficient documentation

## 2013-05-25 HISTORY — DX: Essential (primary) hypertension: I10

## 2013-05-25 HISTORY — DX: Other chest pain: R07.89

## 2013-05-25 HISTORY — DX: Type 2 diabetes mellitus without complications: E11.9

## 2013-05-25 LAB — CBC
HEMATOCRIT: 39.9 % (ref 36.0–46.0)
Hemoglobin: 13.4 g/dL (ref 12.0–15.0)
MCH: 27.6 pg (ref 26.0–34.0)
MCHC: 33.6 g/dL (ref 30.0–36.0)
MCV: 82.3 fL (ref 78.0–100.0)
Platelets: 208 10*3/uL (ref 150–400)
RBC: 4.85 MIL/uL (ref 3.87–5.11)
RDW: 13.4 % (ref 11.5–15.5)
WBC: 8.9 10*3/uL (ref 4.0–10.5)

## 2013-05-25 LAB — BASIC METABOLIC PANEL
BUN: 9 mg/dL (ref 6–23)
CO2: 22 mEq/L (ref 19–32)
CREATININE: 0.65 mg/dL (ref 0.50–1.10)
Calcium: 8.9 mg/dL (ref 8.4–10.5)
Chloride: 102 mEq/L (ref 96–112)
Glucose, Bld: 194 mg/dL — ABNORMAL HIGH (ref 70–99)
Potassium: 4.2 mEq/L (ref 3.7–5.3)
Sodium: 137 mEq/L (ref 137–147)

## 2013-05-25 LAB — POCT I-STAT TROPONIN I
Troponin i, poc: 0 ng/mL (ref 0.00–0.08)
Troponin i, poc: 0 ng/mL (ref 0.00–0.08)

## 2013-05-25 MED ORDER — KETOROLAC TROMETHAMINE 30 MG/ML IJ SOLN
60.0000 mg | Freq: Once | INTRAMUSCULAR | Status: AC
  Administered 2013-05-25: 60 mg via INTRAMUSCULAR
  Filled 2013-05-25: qty 2

## 2013-05-25 NOTE — ED Provider Notes (Signed)
TIME SEEN: 8:10 PM  CHIEF COMPLAINT: Chest pain  HPI: Patient is a 48 year old female with a hypertension, diabetes who presents emergency room with right-sided sharp chest pain that radiates into the central chest that started this morning at 11 AM. She states that her symptoms may be due to stress. She denies any lightheadedness, shortness of breath, diaphoresis, nausea or vomiting, fevers or cough.  She states she's had a prior stress test several years ago which was negative. She denies a prior history of PE or DVT, lower extremity swelling or pain, exogenous hormone use, tobacco use, recent prolonged immobilization such as long flight or hospitalization, fracture, surgery, trauma. She states her chest pain comes on for several seconds and then will resolve spontaneously.  ROS: See HPI Constitutional: no fever  Eyes: no drainage  ENT: no runny nose   Cardiovascular:  chest pain  Resp: no SOB  GI: no vomiting GU: no dysuria Integumentary: no rash  Allergy: no hives  Musculoskeletal: no leg swelling  Neurological: no slurred speech ROS otherwise negative  PAST MEDICAL HISTORY/PAST SURGICAL HISTORY:  Past Medical History  Diagnosis Date  . Chest wall pain   . Diabetes mellitus without complication   . Hypertension     MEDICATIONS:  Prior to Admission medications   Medication Sig Start Date End Date Taking? Authorizing Provider  amLODipine-valsartan (EXFORGE) 10-320 MG per tablet Take 1 tablet by mouth daily.   Yes Historical Provider, MD  aspirin 81 MG chewable tablet Chew 81 mg by mouth daily.   Yes Historical Provider, MD  Canagliflozin (INVOKANA) 100 MG TABS Take 100 mg by mouth daily.   Yes Historical Provider, MD  cloNIDine (CATAPRES) 0.1 MG tablet Take 0.1 mg by mouth at bedtime.   Yes Historical Provider, MD  fluticasone (FLONASE) 50 MCG/ACT nasal spray Place 1 spray into both nostrils daily.   Yes Historical Provider, MD  insulin glargine (LANTUS) 100 UNIT/ML injection  Inject 35 Units into the skin at bedtime.   Yes Historical Provider, MD  Liraglutide (VICTOZA) 18 MG/3ML SOLN Inject 1.8 mg into the skin daily.   Yes Historical Provider, MD  metFORMIN (GLUCOPHAGE) 500 MG tablet Take 750 mg by mouth 2 (two) times daily with a meal.   Yes Historical Provider, MD  nebivolol (BYSTOLIC) 10 MG tablet Take 30 mg by mouth daily.    Yes Historical Provider, MD  omeprazole (PRILOSEC) 20 MG capsule Take 20 mg by mouth daily.   Yes Historical Provider, MD  potassium chloride SA (K-DUR,KLOR-CON) 20 MEQ tablet Take 80 mEq by mouth daily.   Yes Historical Provider, MD    ALLERGIES:  Allergies  Allergen Reactions  . Hydrocodone Itching    SOCIAL HISTORY:  History  Substance Use Topics  . Smoking status: Never Smoker   . Smokeless tobacco: Never Used  . Alcohol Use: No    FAMILY HISTORY: History reviewed. No pertinent family history.  EXAM: BP 143/100  Pulse 78  Temp(Src) 98.2 F (36.8 C) (Oral)  Resp 11  SpO2 100% CONSTITUTIONAL: Alert and oriented and responds appropriately to questions. Well-appearing; well-nourished HEAD: Normocephalic EYES: Conjunctivae clear, PERRL ENT: normal nose; no rhinorrhea; moist mucous membranes; pharynx without lesions noted NECK: Supple, no meningismus, no LAD  CARD: RRR; S1 and S2 appreciated; no murmurs, no clicks, no rubs, no gallops RESP: Normal chest excursion without splinting or tachypnea; breath sounds clear and equal bilaterally; no wheezes, no rhonchi, no rales,  ABD/GI: Normal bowel sounds; non-distended; soft, non-tender, no rebound, no  guarding BACK:  The back appears normal and is non-tender to palpation, there is no CVA tenderness EXT: Normal ROM in all joints; non-tender to palpation; no edema; normal capillary refill; no cyanosis    SKIN: Normal color for age and race; warm NEURO: Moves all extremities equally PSYCH: The patient's mood and manner are appropriate. Grooming and personal hygiene are  appropriate.  MEDICAL DECISION MAKING: Patient here with very atypical chest pain. She does have some risk factors for ACS but given her chest pain only lasts for several seconds and then resolves, I am not concerned for any life-threatening illness. She has no risk factors for pulmonary embolus. She is hemodynamically stable. Labs are unremarkable. First troponin is negative. EKG shows some ischemic changes but is unchanged compared to her prior EKG in June 2012. Will repeat troponin and add on chest x-ray. Anticipate discharge home. Will give Toradol.  ED PROGRESS: Patient reports feeling better after Toradol. Suspect this is chest wall pain. Her repeat troponin is negative. We'll discharge him with return precautions, PCP followup. Patient verbalizes understanding is comfortable plan.    EKG Interpretation    Date/Time:  Monday May 25 2013 17:26:40 EST Ventricular Rate:  78 PR Interval:  148 QRS Duration: 91 QT Interval:  426 QTC Calculation: 485 R Axis:   -13 Text Interpretation:  Sinus rhythm Atrial premature complexes LVH with secondary repolarization abnormality Probable anterior infarct, age indeterminate No changed compared to June 2012 Confirmed by WARD  DO, KRISTEN [phone]) on 05/25/2013 5:31:45 PM             Layla MawKristen N Ward, DO 05/25/13 2204

## 2013-05-25 NOTE — Progress Notes (Signed)
   CARE MANAGEMENT ED NOTE 05/25/2013  [facility]  [facility]   Account Number:  [id number]  Date Initiated:  05/25/2013  Documentation initiated by:  Radford PaxFERRERO,Veretta Sabourin  Subjective/Objective Assessment:   Patient presents to Ed with right sided sharp chest pain     Subjective/Objective Assessment Detail:   Patient with pmhx of diabetes and HTN.     Action/Plan:   Action/Plan Detail:   Anticipated DC Date:  05/25/2013     Status Recommendation to Physician:   Result of Recommendation:    Other ED Services  Consult Working Plan    DC Planning Services  Other  PCP issues    Choice offered to / List presented to:            Status of service:  Completed, signed off  ED Comments:   ED Comments Detail:  Patient reports her pcp is Dr. Gwenlyn PerkingMargie Trent.  System updated.

## 2013-05-25 NOTE — Discharge Instructions (Signed)
Chest Pain (Nonspecific) °It is often hard to give a specific diagnosis for the cause of chest pain. There is always a chance that your pain could be related to something serious, such as a heart attack or a blood clot in the lungs. You need to follow up with your caregiver for further evaluation. °CAUSES  °· Heartburn. °· Pneumonia or bronchitis. °· Anxiety or stress. °· Inflammation around your heart (pericarditis) or lung (pleuritis or pleurisy). °· A blood clot in the lung. °· A collapsed lung (pneumothorax). It can develop suddenly on its own (spontaneous pneumothorax) or from injury (trauma) to the chest. °· Shingles infection (herpes zoster virus). °The chest wall is composed of bones, muscles, and cartilage. Any of these can be the source of the pain. °· The bones can be bruised by injury. °· The muscles or cartilage can be strained by coughing or overwork. °· The cartilage can be affected by inflammation and become sore (costochondritis). °DIAGNOSIS  °Lab tests or other studies, such as X-rays, electrocardiography, stress testing, or cardiac imaging, may be needed to find the cause of your pain.  °TREATMENT  °· Treatment depends on what may be causing your chest pain. Treatment may include: °· Acid blockers for heartburn. °· Anti-inflammatory medicine. °· Pain medicine for inflammatory conditions. °· Antibiotics if an infection is present. °· You may be advised to change lifestyle habits. This includes stopping smoking and avoiding alcohol, caffeine, and chocolate. °· You may be advised to keep your head raised (elevated) when sleeping. This reduces the chance of acid going backward from your stomach into your esophagus. °· Most of the time, nonspecific chest pain will improve within 2 to 3 days with rest and mild pain medicine. °HOME CARE INSTRUCTIONS  °· If antibiotics were prescribed, take your antibiotics as directed. Finish them even if you start to feel better. °· For the next few days, avoid physical  activities that bring on chest pain. Continue physical activities as directed. °· Do not smoke. °· Avoid drinking alcohol. °· Only take over-the-counter or prescription medicine for pain, discomfort, or fever as directed by your caregiver. °· Follow your caregiver's suggestions for further testing if your chest pain does not go away. °· Keep any follow-up appointments you made. If you do not go to an appointment, you could develop lasting (chronic) problems with pain. If there is any problem keeping an appointment, you must call to reschedule. °SEEK MEDICAL CARE IF:  °· You think you are having problems from the medicine you are taking. Read your medicine instructions carefully. °· Your chest pain does not go away, even after treatment. °· You develop a rash with blisters on your chest. °SEEK IMMEDIATE MEDICAL CARE IF:  °· You have increased chest pain or pain that spreads to your arm, neck, jaw, back, or abdomen. °· You develop shortness of breath, an increasing cough, or you are coughing up blood. °· You have severe back or abdominal pain, feel nauseous, or vomit. °· You develop severe weakness, fainting, or chills. °· You have a fever. °THIS IS AN EMERGENCY. Do not wait to see if the pain will go away. Get medical help at once. Call your local emergency services (911 in [place] Do not drive yourself to the hospital. °MAKE SURE YOU:  °· Understand these instructions. °· Will watch your condition. °· Will get help right away if you are not doing well or get worse. °Document Released: 01/24/2005 Document Revised: 07/09/2011 Document Reviewed: 11/20/2007 °ExitCare® Patient Information ©2014 [employer],   LLC. ° °Chest Wall Pain °Chest wall pain is pain in or around the bones and muscles of your chest. It may take up to 6 weeks to get better. It may take longer if you must stay physically active in your work and activities.  °CAUSES  °Chest wall pain may happen on its own. However, it may be caused by: °· A viral illness  like the flu. °· Injury. °· Coughing. °· Exercise. °· Arthritis. °· Fibromyalgia. °· Shingles. °HOME CARE INSTRUCTIONS  °· Avoid overtiring physical activity. Try not to strain or perform activities that cause pain. This includes any activities using your chest or your abdominal and side muscles, especially if heavy weights are used. °· Put ice on the sore area. °· Put ice in a plastic bag. °· Place a towel between your skin and the bag. °· Leave the ice on for 15-20 minutes per hour while awake for the first 2 days. °· Only take over-the-counter or prescription medicines for pain, discomfort, or fever as directed by your caregiver. °SEEK IMMEDIATE MEDICAL CARE IF:  °· Your pain increases, or you are very uncomfortable. °· You have a fever. °· Your chest pain becomes worse. °· You have new, unexplained symptoms. °· You have nausea or vomiting. °· You feel sweaty or lightheaded. °· You have a cough with phlegm (sputum), or you cough up blood. °MAKE SURE YOU:  °· Understand these instructions. °· Will watch your condition. °· Will get help right away if you are not doing well or get worse. °Document Released: 04/16/2005 Document Revised: 07/09/2011 Document Reviewed: 12/11/2010 °ExitCare® Patient Information ©2014 [employer] LLC. ° °

## 2013-05-25 NOTE — ED Notes (Addendum)
Pt c/o intermittent R side chest pain that has radiated into central chest.  Pt sts this episode started this morning.  Sts "I was stressed this weekend."  Pain score 6/10.  Hx of intermittent chest wall pain, HTN, and DM.

## 2013-11-02 ENCOUNTER — Encounter (HOSPITAL_COMMUNITY): Payer: Self-pay | Admitting: Emergency Medicine

## 2013-11-02 ENCOUNTER — Emergency Department (HOSPITAL_COMMUNITY)
Admission: EM | Admit: 2013-11-02 | Discharge: 2013-11-03 | Disposition: A | Discharge status: Home / Self Care | Payer: PRIVATE HEALTH INSURANCE | Attending: Emergency Medicine | Admitting: Emergency Medicine

## 2013-11-02 DIAGNOSIS — R1084 Generalized abdominal pain: Secondary | ICD-10-CM | POA: Insufficient documentation

## 2013-11-02 DIAGNOSIS — I10 Essential (primary) hypertension: Secondary | ICD-10-CM | POA: Insufficient documentation

## 2013-11-02 DIAGNOSIS — Z7982 Long term (current) use of aspirin: Secondary | ICD-10-CM | POA: Insufficient documentation

## 2013-11-02 DIAGNOSIS — Z3202 Encounter for pregnancy test, result negative: Secondary | ICD-10-CM | POA: Insufficient documentation

## 2013-11-02 DIAGNOSIS — IMO0002 Reserved for concepts with insufficient information to code with codable children: Secondary | ICD-10-CM | POA: Insufficient documentation

## 2013-11-02 DIAGNOSIS — Z79899 Other long term (current) drug therapy: Secondary | ICD-10-CM | POA: Insufficient documentation

## 2013-11-02 DIAGNOSIS — E119 Type 2 diabetes mellitus without complications: Secondary | ICD-10-CM | POA: Insufficient documentation

## 2013-11-02 DIAGNOSIS — R112 Nausea with vomiting, unspecified: Secondary | ICD-10-CM

## 2013-11-02 MED ORDER — ONDANSETRON HCL 4 MG/2ML IJ SOLN
4.0000 mg | Freq: Once | INTRAMUSCULAR | Status: AC
Start: 2013-11-03 — End: 2013-11-03
  Administered 2013-11-03: 4 mg via INTRAVENOUS
  Filled 2013-11-02: qty 2

## 2013-11-02 NOTE — ED Notes (Signed)
[facility]. States she began feeling ill after that. Patient states she is unable to tolerate POs and has had two episodes of emesis.

## 2013-11-03 LAB — URINALYSIS, ROUTINE W REFLEX MICROSCOPIC
BILIRUBIN URINE: NEGATIVE
Hgb urine dipstick: NEGATIVE
KETONES UR: 40 mg/dL — AB
Leukocytes, UA: NEGATIVE
Nitrite: NEGATIVE
PROTEIN: 30 mg/dL — AB
Specific Gravity, Urine: 1.04 — ABNORMAL HIGH (ref 1.005–1.030)
UROBILINOGEN UA: 0.2 mg/dL (ref 0.0–1.0)
pH: 7 (ref 5.0–8.0)

## 2013-11-03 LAB — CBC WITH DIFFERENTIAL/PLATELET
BASOS PCT: 1 % (ref 0–1)
Basophils Absolute: 0.1 10*3/uL (ref 0.0–0.1)
Eosinophils Absolute: 0.2 10*3/uL (ref 0.0–0.7)
Eosinophils Relative: 1 % (ref 0–5)
HEMATOCRIT: 45.3 % (ref 36.0–46.0)
HEMOGLOBIN: 16 g/dL — AB (ref 12.0–15.0)
LYMPHS ABS: 3.3 10*3/uL (ref 0.7–4.0)
Lymphocytes Relative: 29 % (ref 12–46)
MCH: 28.3 pg (ref 26.0–34.0)
MCHC: 35.3 g/dL (ref 30.0–36.0)
MCV: 80.2 fL (ref 78.0–100.0)
MONO ABS: 0.8 10*3/uL (ref 0.1–1.0)
MONOS PCT: 7 % (ref 3–12)
NEUTROS ABS: 7.1 10*3/uL (ref 1.7–7.7)
Neutrophils Relative %: 62 % (ref 43–77)
Platelets: 228 10*3/uL (ref 150–400)
RBC: 5.65 MIL/uL — ABNORMAL HIGH (ref 3.87–5.11)
RDW: 13.5 % (ref 11.5–15.5)
WBC: 11.4 10*3/uL — ABNORMAL HIGH (ref 4.0–10.5)

## 2013-11-03 LAB — URINE MICROSCOPIC-ADD ON

## 2013-11-03 LAB — COMPREHENSIVE METABOLIC PANEL
ALBUMIN: 4.3 g/dL (ref 3.5–5.2)
ALT: 13 U/L (ref 0–35)
ANION GAP: 16 — AB (ref 5–15)
AST: 17 U/L (ref 0–37)
Alkaline Phosphatase: 99 U/L (ref 39–117)
BILIRUBIN TOTAL: 0.9 mg/dL (ref 0.3–1.2)
BUN: 6 mg/dL (ref 6–23)
CHLORIDE: 100 meq/L (ref 96–112)
CO2: 26 mEq/L (ref 19–32)
CREATININE: 0.66 mg/dL (ref 0.50–1.10)
Calcium: 9.5 mg/dL (ref 8.4–10.5)
GLUCOSE: 192 mg/dL — AB (ref 70–99)
Potassium: 3.2 mEq/L — ABNORMAL LOW (ref 3.7–5.3)
Sodium: 142 mEq/L (ref 137–147)
Total Protein: 8 g/dL (ref 6.0–8.3)

## 2013-11-03 LAB — POC URINE PREG, ED: Preg Test, Ur: NEGATIVE

## 2013-11-03 LAB — LIPASE, BLOOD: LIPASE: 43 U/L (ref 11–59)

## 2013-11-03 MED ORDER — PROMETHAZINE HCL 25 MG PO TABS: 25.0000 mg | ORAL_TABLET | Freq: Four times a day (QID) | ORAL | Status: DC | PRN

## 2013-11-03 MED ORDER — POTASSIUM CHLORIDE CRYS ER 20 MEQ PO TBCR
40.0000 meq | EXTENDED_RELEASE_TABLET | Freq: Once | ORAL | Status: AC
Start: 2013-11-03 — End: 2013-11-03
  Administered 2013-11-03: 40 meq via ORAL
  Filled 2013-11-03: qty 2

## 2013-11-03 MED ORDER — SODIUM CHLORIDE 0.9 % IV BOLUS (SEPSIS)
1000.0000 mL | Freq: Once | INTRAVENOUS | Status: AC
  Administered 2013-11-03: 1000 mL via INTRAVENOUS

## 2013-11-03 MED ORDER — PROMETHAZINE HCL 25 MG/ML IJ SOLN
12.5000 mg | Freq: Once | INTRAMUSCULAR | Status: AC
  Administered 2013-11-03: 12.5 mg via INTRAVENOUS
  Filled 2013-11-03: qty 1

## 2013-11-03 NOTE — ED Provider Notes (Signed)
Medical screening examination/treatment/procedure(s) were performed by non-physician practitioner and as supervising physician I was immediately available for consultation/collaboration.   EKG Interpretation None        Ericberto Padget L Antoria Lanza, MD 11/03/13 0424 

## 2013-11-03 NOTE — Discharge Instructions (Signed)
Take Phenergan as needed for persistent symptoms. Refrain from eating spicy, greasy, or fatty foods as well as milk products until symptoms resolve. Followup with your primary doctor to ensure resolution of symptoms.  Nausea and Vomiting Nausea is a sick feeling that often comes before throwing up (vomiting). Vomiting is a reflex where stomach contents come out of your mouth. Vomiting can cause severe loss of body fluids (dehydration). Children and elderly adults can become dehydrated quickly, especially if they also have diarrhea. Nausea and vomiting are symptoms of a condition or disease. It is important to find the cause of your symptoms. CAUSES   Direct irritation of the stomach lining. This irritation can result from increased acid production (gastroesophageal reflux disease), infection, food poisoning, taking certain medicines (such as nonsteroidal anti-inflammatory drugs), alcohol use, or tobacco use.  Signals from the brain.These signals could be caused by a headache, heat exposure, an inner ear disturbance, increased pressure in the brain from injury, infection, a tumor, or a concussion, pain, emotional stimulus, or metabolic problems.  An obstruction in the gastrointestinal tract (bowel obstruction).  Illnesses such as diabetes, hepatitis, gallbladder problems, appendicitis, kidney problems, cancer, sepsis, atypical symptoms of a heart attack, or eating disorders.  Medical treatments such as chemotherapy and radiation.  Receiving medicine that makes you sleep (general anesthetic) during surgery. DIAGNOSIS Your caregiver may ask for tests to be done if the problems do not improve after a few days. Tests may also be done if symptoms are severe or if the reason for the nausea and vomiting is not clear. Tests may include:  Urine tests.  Blood tests.  Stool tests.  Cultures (to look for evidence of infection).  X-rays or other imaging studies. Test results can help your caregiver  make decisions about treatment or the need for additional tests. TREATMENT You need to stay well hydrated. Drink frequently but in small amounts.You may wish to drink water, sports drinks, clear broth, or eat frozen ice pops or gelatin dessert to help stay hydrated.When you eat, eating slowly may help prevent nausea.There are also some antinausea medicines that may help prevent nausea. HOME CARE INSTRUCTIONS   Take all medicine as directed by your caregiver.  If you do not have an appetite, do not force yourself to eat. However, you must continue to drink fluids.  If you have an appetite, eat a normal diet unless your caregiver tells you differently.  Eat a variety of complex carbohydrates (rice, wheat, potatoes, bread), lean meats, yogurt, fruits, and vegetables.  Avoid high-fat foods because they are more difficult to digest.  Drink enough water and fluids to keep your urine clear or pale yellow.  If you are dehydrated, ask your caregiver for specific rehydration instructions. Signs of dehydration may include:  Severe thirst.  Dry lips and mouth.  Dizziness.  Dark urine.  Decreasing urine frequency and amount.  Confusion.  Rapid breathing or pulse. SEEK IMMEDIATE MEDICAL CARE IF:   You have blood or brown flecks (like coffee grounds) in your vomit.  You have black or bloody stools.  You have a severe headache or stiff neck.  You are confused.  You have severe abdominal pain.  You have chest pain or trouble breathing.  You do not urinate at least once every 8 hours.  You develop cold or clammy skin.  You continue to vomit for longer than 24 to 48 hours.  You have a fever. MAKE SURE YOU:   Understand these instructions.  Will watch your condition.  Will get help right away if you are not doing well or get worse. Document Released: 04/16/2005 Document Revised: 07/09/2011 Document Reviewed: 09/13/2010 [facility] Patient Information 2015 [employer] [place].  This information is not intended to replace advice given to you by your health care provider. Make sure you discuss any questions you have with your health care provider.

## 2013-11-03 NOTE — ED Notes (Signed)
Pt given water for fluid challenge 

## 2013-11-03 NOTE — ED Provider Notes (Signed)
CSN: [id number]     Arrival date & time 11/02/13  2347 History   First MD Initiated Contact with Patient 11/03/13 0012     Chief Complaint  Patient presents with  . Nausea and Vomiting     (Consider location/radiation/quality/duration/timing/severity/associated sxs/prior Treatment) HPI Comments: Patient is a 48 year old female with a history of diabetes mellitus, hypertension, and chest wall pain who presents to the emergency department today for nausea and vomiting. Patient states that she ate a [employer] quarter pounder with cheese at 1500 and, approximately one hour later, began to feel nauseous. Patient has had 2 episodes of emesis since this time which have been nonbloody. Symptoms associated with generalized abdominal cramping. She endorses a bowel movement in the last 24 hours and denies history of abdominal surgeries. No associated fever, syncope, chest pain, shortness of breath, urinary symptoms, vaginal complaints, or numbness/weakness.  The history is provided by the patient. No language interpreter was used.    Past Medical History  Diagnosis Date  . Chest wall pain   . Diabetes mellitus without complication   . Hypertension    History reviewed. No pertinent past surgical history. No family history on file. History  Substance Use Topics  . Smoking status: Never Smoker   . Smokeless tobacco: Never Used  . Alcohol Use: No   OB History   Grav Para Term Preterm Abortions TAB SAB Ect Mult Living                  Review of Systems  Constitutional: Negative for fever.  Respiratory: Negative for shortness of breath.   Cardiovascular: Negative for chest pain.  Gastrointestinal: Positive for nausea, vomiting and abdominal pain (Generalized cramping).  Genitourinary: Negative for dysuria and hematuria.  Neurological: Negative for syncope.  All other systems reviewed and are negative.    Allergies  Hydrocodone  Home Medications   Prior to Admission medications    Medication Sig Start Date End Date Taking? Authorizing Provider  amLODipine-valsartan (EXFORGE) 10-320 MG per tablet Take 1 tablet by mouth daily.   Yes Historical Provider, MD  aspirin 81 MG chewable tablet Chew 81 mg by mouth daily.   Yes Historical Provider, MD  Canagliflozin (INVOKANA) 100 MG TABS Take 100 mg by mouth daily.   Yes Historical Provider, MD  cloNIDine (CATAPRES) 0.1 MG tablet Take 0.1 mg by mouth at bedtime.   Yes Historical Provider, MD  fluticasone (FLONASE) 50 MCG/ACT nasal spray Place 1 spray into both nostrils daily.   Yes Historical Provider, MD  insulin glargine (LANTUS) 100 UNIT/ML injection Inject 35 Units into the skin at bedtime.   Yes Historical Provider, MD  Liraglutide (VICTOZA) 18 MG/3ML SOLN Inject 1.8 mg into the skin every morning.    Yes Historical Provider, MD  metFORMIN (GLUCOPHAGE) 500 MG tablet Take 750 mg by mouth 2 (two) times daily with a meal.   Yes Historical Provider, MD  nebivolol (BYSTOLIC) 10 MG tablet Take 30 mg by mouth daily.    Yes Historical Provider, MD  omeprazole (PRILOSEC) 20 MG capsule Take 20 mg by mouth 2 (two) times daily.    Yes Historical Provider, MD  potassium chloride SA (K-DUR,KLOR-CON) 20 MEQ tablet Take 80 mEq by mouth daily.   Yes Historical Provider, MD  promethazine (PHENERGAN) 25 MG tablet Take 1 tablet (25 mg total) by mouth every 6 (six) hours as needed for nausea or vomiting. 11/03/13   Antony Madura, PA-C   BP 116/74  Pulse 106  Temp(Src) 97.9 F (  36.6 C) (Oral)  Resp 18  Ht 5\' 4"  (1.626 m)  Wt 180 lb (81.647 kg)  BMI 30.88 kg/m2  SpO2 97%  Physical Exam  Nursing note and vitals reviewed. Constitutional: She is oriented to person, place, and time. She appears well-developed and well-nourished. No distress.  Nontoxic/nonseptic appearing  HENT:  Head: Normocephalic and atraumatic.  Eyes: Conjunctivae and EOM are normal. No scleral icterus.  Neck: Normal range of motion.  Cardiovascular: Normal rate, regular  rhythm and normal heart sounds.   Pulmonary/Chest: Effort normal and breath sounds normal. No respiratory distress. She has no wheezes. She has no rales.  Chest expansion symmetric  Abdominal: Soft. There is no tenderness. There is no rebound and no guarding.  Soft and nontender. No masses. No peritoneal signs.  Musculoskeletal: Normal range of motion.  Neurological: She is alert and oriented to person, place, and time.   GCS 15. Patient moves extremities without ataxia.  Skin: Skin is warm and dry. No rash noted. She is not diaphoretic. No erythema. No pallor.  Psychiatric: She has a normal mood and affect. Her behavior is normal.    ED Course  Procedures (including critical care time) Labs Review Labs Reviewed  CBC WITH DIFFERENTIAL - Abnormal; Notable for the following:    WBC 11.4 (*)    RBC 5.65 (*)    Hemoglobin 16.0 (*)    All other components within normal limits  COMPREHENSIVE METABOLIC PANEL - Abnormal; Notable for the following:    Potassium 3.2 (*)    Glucose, Bld 192 (*)    Anion gap 16 (*)    All other components within normal limits  URINALYSIS, ROUTINE W REFLEX MICROSCOPIC - Abnormal; Notable for the following:    APPearance TURBID (*)    Specific Gravity, Urine 1.040 (*)    Glucose, UA >1000 (*)    Ketones, ur 40 (*)    Protein, ur 30 (*)    All other components within normal limits  URINE MICROSCOPIC-ADD ON - Abnormal; Notable for the following:    Squamous Epithelial / LPF FEW (*)    All other components within normal limits  LIPASE, BLOOD  POC URINE PREG, ED    Imaging Review No results found.   EKG Interpretation None      MDM   Final diagnoses:  Nausea and vomiting, vomiting of unspecified type    48 year old female presents to the emergency department for nausea and vomiting, onset after eating a quarter pounder with cheese at [employer]. Patient nontoxic and nonseptic appearing, hemodynamically stable, and afebrile on arrival. Abdomen soft  without focal tenderness or peritoneal signs. No masses appreciated.  Labs today significant for leukocytosis of 11.4 and hypokalemia of 3.2. Patient given oral potassium in ED for this. Symptoms managed with Zofran, Phenergan, and fluids. Patient has been able to tolerate medication and fluids by mouth without emesis. Abdominal reexaminations stable. Doubt pSBO or SBO given recent BM and lack of abdominal tenderness. Doubt cholecystitis given lack of RUQ TTP and elevated LFTs. Lipase WNL.  Suspect symptoms today to be secondary to food intake. Patient stable and appropriate for discharge with prescription for antiemetics and instruction to followup with her primary care provider to ensure a solution of symptoms. Return precautions provided and patient agreeable to plan with no unaddressed concerns.   Filed Vitals:   11/02/13 2353 11/03/13 0200  BP: 127/100 116/74  Pulse: 97 106  Temp: 98.3 F (36.8 C) 97.9 F (36.6 C)  TempSrc: Oral Oral  Resp: 18 18  Height: 5\' 4"  (1.626 m)   Weight: 180 lb (81.647 kg)   SpO2: 99% 97%      Antony MaduraKelly Cerinity Zynda, PA-C 11/03/13 0210

## 2014-09-04 ENCOUNTER — Emergency Department (HOSPITAL_COMMUNITY)
Admission: EM | Admit: 2014-09-04 | Discharge: 2014-09-04 | Disposition: A | Discharge status: Home / Self Care | Payer: No Typology Code available for payment source | Attending: Emergency Medicine | Admitting: Emergency Medicine

## 2014-09-04 ENCOUNTER — Encounter (HOSPITAL_COMMUNITY): Payer: Self-pay | Admitting: Emergency Medicine

## 2014-09-04 DIAGNOSIS — Z794 Long term (current) use of insulin: Secondary | ICD-10-CM | POA: Insufficient documentation

## 2014-09-04 DIAGNOSIS — Z7982 Long term (current) use of aspirin: Secondary | ICD-10-CM | POA: Insufficient documentation

## 2014-09-04 DIAGNOSIS — R1110 Vomiting, unspecified: Secondary | ICD-10-CM | POA: Diagnosis present

## 2014-09-04 DIAGNOSIS — I10 Essential (primary) hypertension: Secondary | ICD-10-CM | POA: Insufficient documentation

## 2014-09-04 DIAGNOSIS — Z79899 Other long term (current) drug therapy: Secondary | ICD-10-CM | POA: Insufficient documentation

## 2014-09-04 DIAGNOSIS — K529 Noninfective gastroenteritis and colitis, unspecified: Secondary | ICD-10-CM | POA: Diagnosis not present

## 2014-09-04 DIAGNOSIS — E119 Type 2 diabetes mellitus without complications: Secondary | ICD-10-CM | POA: Insufficient documentation

## 2014-09-04 DIAGNOSIS — Z7951 Long term (current) use of inhaled steroids: Secondary | ICD-10-CM | POA: Insufficient documentation

## 2014-09-04 LAB — COMPREHENSIVE METABOLIC PANEL
ALT: 21 U/L (ref 14–54)
AST: 23 U/L (ref 15–41)
Albumin: 4.3 g/dL (ref 3.5–5.0)
Alkaline Phosphatase: 78 U/L (ref 38–126)
Anion gap: 13 (ref 5–15)
BUN: 11 mg/dL (ref 6–20)
CO2: 25 mmol/L (ref 22–32)
Calcium: 9.3 mg/dL (ref 8.9–10.3)
Chloride: 101 mmol/L (ref 101–111)
Creatinine, Ser: 0.92 mg/dL (ref 0.44–1.00)
GFR calc Af Amer: >60 mL/min (ref >60)
GLUCOSE: 173 mg/dL — AB (ref 70–99)
Potassium: 3.1 mmol/L — ABNORMAL LOW (ref 3.5–5.1)
SODIUM: 139 mmol/L (ref 135–145)
Total Bilirubin: 1.7 mg/dL — ABNORMAL HIGH (ref 0.3–1.2)
Total Protein: 7.7 g/dL (ref 6.5–8.1)

## 2014-09-04 LAB — CBC WITH DIFFERENTIAL/PLATELET
BASOS PCT: 1 % (ref 0–1)
Basophils Absolute: 0.1 10*3/uL (ref 0.0–0.1)
EOS ABS: 0.1 10*3/uL (ref 0.0–0.7)
EOS PCT: 1 % (ref 0–5)
HEMATOCRIT: 47 % — AB (ref 36.0–46.0)
HEMOGLOBIN: 15.8 g/dL — AB (ref 12.0–15.0)
LYMPHS ABS: 3.1 10*3/uL (ref 0.7–4.0)
Lymphocytes Relative: 31 % (ref 12–46)
MCH: 27.2 pg (ref 26.0–34.0)
MCHC: 33.6 g/dL (ref 30.0–36.0)
MCV: 80.9 fL (ref 78.0–100.0)
MONO ABS: 0.5 10*3/uL (ref 0.1–1.0)
MONOS PCT: 5 % (ref 3–12)
Neutro Abs: 6.2 10*3/uL (ref 1.7–7.7)
Neutrophils Relative %: 62 % (ref 43–77)
Platelets: 204 10*3/uL (ref 150–400)
RBC: 5.81 MIL/uL — AB (ref 3.87–5.11)
RDW: 13.6 % (ref 11.5–15.5)
WBC: 10 10*3/uL (ref 4.0–10.5)

## 2014-09-04 LAB — URINALYSIS, ROUTINE W REFLEX MICROSCOPIC
BILIRUBIN URINE: NEGATIVE
Glucose, UA: >1000 mg/dL — AB
Hgb urine dipstick: NEGATIVE
KETONES UR: 40 mg/dL — AB
NITRITE: NEGATIVE
Protein, ur: NEGATIVE mg/dL
Specific Gravity, Urine: 1.014 (ref 1.005–1.030)
UROBILINOGEN UA: 0.2 mg/dL (ref 0.0–1.0)
pH: 6.5 (ref 5.0–8.0)

## 2014-09-04 LAB — URINE MICROSCOPIC-ADD ON

## 2014-09-04 LAB — LIPASE, BLOOD: Lipase: 37 U/L (ref 22–51)

## 2014-09-04 MED ORDER — SODIUM CHLORIDE 0.9 % IV BOLUS (SEPSIS)
1000.0000 mL | Freq: Once | INTRAVENOUS | Status: AC
  Administered 2014-09-04: 1000 mL via INTRAVENOUS

## 2014-09-04 MED ORDER — POTASSIUM CHLORIDE CRYS ER 20 MEQ PO TBCR
40.0000 meq | EXTENDED_RELEASE_TABLET | Freq: Once | ORAL | Status: AC
  Administered 2014-09-04: 40 meq via ORAL
  Filled 2014-09-04: qty 2

## 2014-09-04 MED ORDER — ONDANSETRON HCL 4 MG/2ML IJ SOLN
4.0000 mg | Freq: Once | INTRAMUSCULAR | Status: AC
  Administered 2014-09-04: 4 mg via INTRAVENOUS
  Filled 2014-09-04: qty 2

## 2014-09-04 NOTE — ED Provider Notes (Signed)
CSN: [id number]     Arrival date & time 09/04/14  1347 History   First MD Initiated Contact with Patient 09/04/14 1539     Chief Complaint  Patient presents with  . Emesis  . Abdominal Pain     (Consider location/radiation/quality/duration/timing/severity/associated sxs/prior Treatment) HPI..... Multiple episodes of vomiting since 8 PM last night. No fever, diarrhea, dysuria, dyspnea, chest pain. Patient has diabetes and has been taking her medication. She is ambulatory without neurological deficits. She feels slightly dehydrated. Severity is moderate. Nothing makes symptoms better or worse.  Past Medical History  Diagnosis Date  . Chest wall pain   . Diabetes mellitus without complication   . Hypertension    History reviewed. No pertinent past surgical history. No family history on file. History  Substance Use Topics  . Smoking status: Never Smoker   . Smokeless tobacco: Never Used  . Alcohol Use: No   OB History    No data available     Review of Systems  All other systems reviewed and are negative.     Allergies  Hydrocodone  Home Medications   Prior to Admission medications   Medication Sig Start Date End Date Taking? Authorizing Provider  amLODipine-valsartan (EXFORGE) 10-320 MG per tablet Take 1 tablet by mouth daily.   Yes Historical Provider, MD  aspirin 81 MG chewable tablet Chew 81 mg by mouth daily.   Yes Historical Provider, MD  Canagliflozin (INVOKANA) 100 MG TABS Take 100 mg by mouth daily.   Yes Historical Provider, MD  cloNIDine (CATAPRES) 0.1 MG tablet Take 0.1 mg by mouth at bedtime.   Yes Historical Provider, MD  Diclofenac 35 MG CAPS Take 35 mg by mouth 3 (three) times daily as needed (for pain).   Yes Historical Provider, MD  fluticasone (FLONASE) 50 MCG/ACT nasal spray Place 1 spray into both nostrils daily.   Yes Historical Provider, MD  insulin glargine (LANTUS) 100 UNIT/ML injection Inject 38 Units into the skin at bedtime.    Yes Historical  Provider, MD  Liraglutide (VICTOZA) 18 MG/3ML SOLN Inject 1.2-1.8 mg into the skin 2 (two) times daily. Takes 1.8mg  in am and 1.2 in pm   Yes Historical Provider, MD  metFORMIN (GLUCOPHAGE) 500 MG tablet Take 750 mg by mouth daily with breakfast.    Yes Historical Provider, MD  nebivolol (BYSTOLIC) 10 MG tablet Take 30 mg by mouth daily.    Yes Historical Provider, MD  omeprazole (PRILOSEC) 40 MG capsule Take 40 mg by mouth 2 (two) times daily. 08/30/14  Yes Historical Provider, MD  ondansetron (ZOFRAN) 8 MG tablet Take 8 mg by mouth daily as needed for nausea or vomiting.  08/30/14  Yes Historical Provider, MD  potassium chloride SA (K-DUR,KLOR-CON) 20 MEQ tablet Take 80 mEq by mouth daily.   Yes Historical Provider, MD  Vitamin D, Ergocalciferol, (DRISDOL) 50000 UNITS CAPS capsule Take 50,000 Units by mouth every Monday. 08/23/14  Yes Historical Provider, MD   BP 126/64 mmHg  Pulse 95  Temp(Src) 98.2 F (36.8 C) (Oral)  Resp 22  Ht 5\' 3"  (1.6 m)  Wt 195 lb (88.451 kg)  BMI 34.55 kg/m2  SpO2 99%  LMP 05/06/2014 Physical Exam  Constitutional: She is oriented to person, place, and time.  Slightly dehydrated  HENT:  Head: Normocephalic and atraumatic.  Eyes: Conjunctivae and EOM are normal. Pupils are equal, round, and reactive to light.  Neck: Normal range of motion. Neck supple.  Cardiovascular: Normal rate and regular rhythm.  Pulmonary/Chest: Effort normal and breath sounds normal.  Abdominal: Soft. Bowel sounds are normal.  Nontender abdomen  Musculoskeletal: Normal range of motion.  Neurological: She is alert and oriented to person, place, and time.  Skin: Skin is warm and dry.  Psychiatric: She has a normal mood and affect. Her behavior is normal.  Nursing note and vitals reviewed.   ED Course  Procedures (including critical care time) Labs Review Labs Reviewed  CBC WITH DIFFERENTIAL/PLATELET - Abnormal; Notable for the following:    RBC 5.81 (*)    Hemoglobin 15.8 (*)     HCT 47.0 (*)    All other components within normal limits  COMPREHENSIVE METABOLIC PANEL - Abnormal; Notable for the following:    Potassium 3.1 (*)    Glucose, Bld 173 (*)    Total Bilirubin 1.7 (*)    All other components within normal limits  URINALYSIS, ROUTINE W REFLEX MICROSCOPIC - Abnormal; Notable for the following:    Glucose, UA >1000 (*)    Ketones, ur 40 (*)    Leukocytes, UA MODERATE (*)    All other components within normal limits  LIPASE, BLOOD  URINE MICROSCOPIC-ADD ON    Imaging Review No results found.   EKG Interpretation None      MDM   Final diagnoses:  Gastroenteritis    Patient feels much better after 2 L of IV fluids. Glucose was 173. Potassium slightly low at 3.1. Urine culture pending. Patient is nontoxic-appearing at discharge with normal vital signs.    Donnetta HutchingBrian Adelita Hone, MD 09/04/14 [phone]

## 2014-09-04 NOTE — ED Notes (Signed)
Pt ate fish and has been throwing up since then.

## 2014-09-04 NOTE — Discharge Instructions (Signed)
Increase fluids. Take your nausea medicine. Check your sugar frequently.

## 2014-09-04 NOTE — ED Notes (Signed)
Pt. Stated, I've been vomiting since last night.  Vomited 7 times today.

## 2014-09-04 NOTE — ED Notes (Signed)
Pt tolerating cracker and ginger alsae ambulated to BR no assist

## 2015-05-09 ENCOUNTER — Emergency Department (HOSPITAL_COMMUNITY)
Admission: EM | Admit: 2015-05-09 | Discharge: 2015-05-09 | Disposition: A | Discharge status: Home / Self Care | Payer: BLUE CROSS/BLUE SHIELD | Attending: Emergency Medicine | Admitting: Emergency Medicine

## 2015-05-09 ENCOUNTER — Emergency Department (HOSPITAL_COMMUNITY): Payer: BLUE CROSS/BLUE SHIELD

## 2015-05-09 ENCOUNTER — Encounter (HOSPITAL_COMMUNITY): Payer: Self-pay | Admitting: Emergency Medicine

## 2015-05-09 DIAGNOSIS — Z794 Long term (current) use of insulin: Secondary | ICD-10-CM | POA: Insufficient documentation

## 2015-05-09 DIAGNOSIS — E119 Type 2 diabetes mellitus without complications: Secondary | ICD-10-CM | POA: Insufficient documentation

## 2015-05-09 DIAGNOSIS — Z7984 Long term (current) use of oral hypoglycemic drugs: Secondary | ICD-10-CM | POA: Diagnosis not present

## 2015-05-09 DIAGNOSIS — Z79899 Other long term (current) drug therapy: Secondary | ICD-10-CM | POA: Insufficient documentation

## 2015-05-09 DIAGNOSIS — I10 Essential (primary) hypertension: Secondary | ICD-10-CM | POA: Diagnosis not present

## 2015-05-09 DIAGNOSIS — R079 Chest pain, unspecified: Secondary | ICD-10-CM | POA: Diagnosis present

## 2015-05-09 DIAGNOSIS — E876 Hypokalemia: Secondary | ICD-10-CM | POA: Insufficient documentation

## 2015-05-09 DIAGNOSIS — Z7982 Long term (current) use of aspirin: Secondary | ICD-10-CM | POA: Insufficient documentation

## 2015-05-09 DIAGNOSIS — R1310 Dysphagia, unspecified: Secondary | ICD-10-CM | POA: Insufficient documentation

## 2015-05-09 DIAGNOSIS — R0789 Other chest pain: Secondary | ICD-10-CM | POA: Insufficient documentation

## 2015-05-09 LAB — I-STAT CHEM 8, ED
BUN: 12 mg/dL (ref 6–20)
CALCIUM ION: 1.14 mmol/L (ref 1.12–1.23)
Chloride: 104 mmol/L (ref 101–111)
Creatinine, Ser: 0.8 mg/dL (ref 0.44–1.00)
GLUCOSE: 210 mg/dL — AB (ref 65–99)
HCT: 46 % (ref 36.0–46.0)
HEMOGLOBIN: 15.6 g/dL — AB (ref 12.0–15.0)
Potassium: 3 mmol/L — ABNORMAL LOW (ref 3.5–5.1)
SODIUM: 145 mmol/L (ref 135–145)
TCO2: 26 mmol/L (ref 0–100)

## 2015-05-09 LAB — I-STAT TROPONIN, ED: Troponin i, poc: 0.01 ng/mL (ref 0.00–0.08)

## 2015-05-09 MED ORDER — POTASSIUM CHLORIDE CRYS ER 20 MEQ PO TBCR
40.0000 meq | EXTENDED_RELEASE_TABLET | Freq: Once | ORAL | Status: AC
  Administered 2015-05-09: 40 meq via ORAL
  Filled 2015-05-09: qty 2

## 2015-05-09 NOTE — ED Notes (Signed)
Pt states that her pain has resolved, she no longer feels the foreign body in her throat, and she feels that she is ready to go home. MD notified.

## 2015-05-09 NOTE — ED Notes (Signed)
Pt presents via EMS from home c/o 8/10 lower esophageal pain and nausea after accidentally swallowing a small piece of a bone during supper tonight at around 5:30pm.  Pt does not appear to be in distress and is breathing normally without difficulty.

## 2015-05-09 NOTE — ED Provider Notes (Signed)
CSN: [id number]     Arrival date & time 05/09/15  2034 History   First MD Initiated Contact with Patient 05/09/15 2145     Chief Complaint  Patient presents with  . Foreign Body     (Consider location/radiation/quality/duration/timing/severity/associated sxs/prior Treatment) HPI Patient states that around 5:00 she was eating a pork chop and thinks she may have swallowed a bone. She had a foreign body sensation in her chest. She then ate some bread. This resolved and she was able to drink water without any vomiting. She states roughly 30 minutes to an hour later she developed chest pain over the right parasternal border. This pain is not worse with swallowing. It is worse with palpation over the chest. She denies any shortness of breath. She's not had any esophageal dysmotility issues in the past. No history of coronary artery disease. No recent extended travel. No new extremity swelling or pain. Past Medical History  Diagnosis Date  . Chest wall pain   . Diabetes mellitus without complication (HCC)   . Hypertension    Past Surgical History  Procedure Laterality Date  . Aneurism repair     No family history on file. Social History  Substance Use Topics  . Smoking status: Never Smoker   . Smokeless tobacco: Never Used  . Alcohol Use: No   OB History    Gravida Para Term Preterm AB TAB SAB Ectopic Multiple Living   3    3  3         Review of Systems  Constitutional: Negative for fever and chills.  HENT: Positive for trouble swallowing. Negative for sore throat and voice change.   Respiratory: Negative for cough and shortness of breath.   Cardiovascular: Positive for chest pain. Negative for palpitations and leg swelling.  Gastrointestinal: Negative for nausea, vomiting, abdominal pain and diarrhea.  Musculoskeletal: Negative for back pain, neck pain and neck stiffness.  Skin: Negative for rash and wound.  Neurological: Negative for dizziness, weakness, light-headedness, numbness  and headaches.  All other systems reviewed and are negative.     Allergies  Hydrocodone  Home Medications   Prior to Admission medications   Medication Sig Start Date End Date Taking? Authorizing Provider  Albiglutide (TANZEUM) 50 MG PEN Inject 50 mg into the skin once a week.   Yes Historical Provider, MD  amLODipine-valsartan (EXFORGE) 10-320 MG per tablet Take 1 tablet by mouth daily.   Yes Historical Provider, MD  aspirin 81 MG chewable tablet Chew 81 mg by mouth daily.   Yes Historical Provider, MD  cloNIDine (CATAPRES) 0.1 MG tablet Take 0.1 mg by mouth at bedtime.   Yes Historical Provider, MD  Diclofenac 35 MG CAPS Take 35 mg by mouth 3 (three) times daily as needed (for pain).   Yes Historical Provider, MD  Doxylamine Succinate, Sleep, (SLEEP AID PO) Take 1 tablet by mouth at bedtime.   Yes Historical Provider, MD  empagliflozin (JARDIANCE) 25 MG TABS tablet Take 25 mg by mouth daily.   Yes Historical Provider, MD  fluticasone (FLONASE) 50 MCG/ACT nasal spray Place 1 spray into both nostrils daily as needed for allergies.    Yes Historical Provider, MD  insulin glargine (LANTUS) 100 UNIT/ML injection Inject 40 Units into the skin at bedtime.    Yes Historical Provider, MD  omeprazole (PRILOSEC) 40 MG capsule Take 40 mg by mouth 2 (two) times daily. 08/30/14  Yes Historical Provider, MD  ondansetron (ZOFRAN) 8 MG tablet Take 8 mg by mouth daily  as needed for nausea or vomiting.  08/30/14  Yes Historical Provider, MD  Vitamin D, Ergocalciferol, (DRISDOL) 50000 UNITS CAPS capsule Take 50,000 Units by mouth every Monday. 08/23/14  Yes Historical Provider, MD   BP 135/90 mmHg  Pulse 76  Temp(Src) 98.3 F (36.8 C) (Oral)  Resp 13  SpO2 100% Physical Exam  Constitutional: She is oriented to person, place, and time. She appears well-developed and well-nourished. No distress.  HENT:  Head: Normocephalic and atraumatic.  Mouth/Throat: Oropharynx is clear and moist. No oropharyngeal  exudate.  Eyes: EOM are normal. Pupils are equal, round, and reactive to light.  Neck: Normal range of motion. Neck supple.  Cardiovascular: Normal rate and regular rhythm.  Exam reveals no gallop and no friction rub.   No murmur heard. Pulmonary/Chest: Effort normal and breath sounds normal. No stridor. No respiratory distress. She has no wheezes. She has no rales. She exhibits tenderness (chest pain is reproduced with palpation over the right lower parasternal region. There is no crepitance or deformity.).  Abdominal: Soft. Bowel sounds are normal. She exhibits no distension and no mass. There is no tenderness. There is no rebound and no guarding.  Musculoskeletal: Normal range of motion. She exhibits no edema or tenderness.  No lower extremity swelling or pain.  Neurological: She is alert and oriented to person, place, and time.  Moves all extremities without deficit. Sensation is fully intact.  Skin: Skin is warm and dry. No rash noted. No erythema.  Psychiatric: She has a normal mood and affect. Her behavior is normal.  Nursing note and vitals reviewed.   ED Course  Procedures (including critical care time) Labs Review Labs Reviewed  I-STAT CHEM 8, ED - Abnormal; Notable for the following:    Potassium 3.0 (*)    Glucose, Bld 210 (*)    Hemoglobin 15.6 (*)    All other components within normal limits  Rosezena SensorI-STAT TROPOININ, ED    Imaging Review Dg Chest 2 View  05/09/2015  CLINICAL DATA:  Sudden onset of midsternal chest pain. EXAM: CHEST  2 VIEW COMPARISON:  05/25/2013 FINDINGS: The cardiomediastinal contours are normal. The lungs are clear. Pulmonary vasculature is normal. No consolidation, pleural effusion, or pneumothorax. No acute osseous abnormalities are seen. IMPRESSION: No acute pulmonary process. Electronically Signed   By: Rubye OaksMelanie  Ehinger M.D.   On: 05/09/2015 22:30   I have personally reviewed and evaluated these images and lab results as part of my medical  decision-making.   EKG Interpretation   Date/Time:  Monday May 09 2015 21:55:41 EST Ventricular Rate:  81 PR Interval:  155 QRS Duration: 117 QT Interval:  441 QTC Calculation: 512 R Axis:   -26 Text Interpretation:  Sinus rhythm Consider right atrial enlargement  Nonspecific intraventricular conduction delay Nonspecific T abnormalities,  diffuse leads Confirmed by Ranae PalmsYELVERTON  MD, Jamiah Homeyer ([id number]) on 05/09/2015  10:37:15 PM      MDM   Final diagnoses:  Dysphagia  Chest wall pain  Hypokalemia    Chest pain is reproduced with palpation. She has a EKG that shows nonspecific T-wave abnormalities that is unchanged from her previous EKG. Chest x-ray without any acute findings. Troponin 6 hours after onset of pain is normal. I have low suspicion for coronary artery disease. Patient is asking to be discharged home. Return precautions have been given.    Loren Raceravid Laylah Riga, MD 05/09/15 2240

## 2015-05-09 NOTE — ED Notes (Signed)
Bed: WA21 Expected date:  Expected time:  Means of arrival:  Comments: Foreign body, fishbone

## 2015-05-09 NOTE — Discharge Instructions (Signed)
Dysphagia Swallowing problems (dysphagia) occur when solids and liquids seem to stick in your throat on the way down to your stomach, or the food takes longer to get to the stomach. Other symptoms include regurgitating food, noises coming from the throat, chest discomfort with swallowing, and a feeling of fullness or the feeling of something being stuck in your throat when swallowing. When blockage in your throat is complete, it may be associated with drooling. CAUSES  Problems with swallowing may occur because of problems with the muscles. The food cannot be propelled in the usual manner into your stomach. You may have ulcers, scar tissue, or inflammation in the tube down which food travels from your mouth to your stomach (esophagus), which blocks food from passing normally into the stomach. Causes of inflammation include:  Acid reflux from your stomach into your esophagus.  Infection.  Radiation treatment for cancer.  Medicines taken without enough fluids to wash them down into your stomach. You may have nerve problems that prevent signals from being sent to the muscles of your esophagus to contract and move your food down to your stomach. Globus pharyngeus is a relatively common problem in which there is a sense of an obstruction or difficulty in swallowing, without any physical abnormalities of the swallowing passages being found. This problem usually improves over time with reassurance and testing to rule out other causes. DIAGNOSIS Dysphagia can be diagnosed and its cause can be determined by tests in which you swallow a white substance that helps illuminate the inside of your throat (contrast medium) while X-rays are taken. Sometimes a flexible telescope that is inserted down your throat (endoscopy) to look at your esophagus and stomach is used. TREATMENT   If the dysphagia is caused by acid reflux or infection, medicines may be used.  If the dysphagia is caused by problems with your  swallowing muscles, swallowing therapy may be used to help you strengthen your swallowing muscles.  If the dysphagia is caused by a blockage or mass, procedures to remove the blockage may be done. HOME CARE INSTRUCTIONS  Try to eat soft food that is easier to swallow and check your weight on a daily basis to be sure that it is not decreasing.  Be sure to drink liquids when sitting upright (not lying down). SEEK MEDICAL CARE IF:  You are losing weight because you are unable to swallow.  You are coughing when you drink liquids (aspiration).  You are coughing up partially digested food. SEEK IMMEDIATE MEDICAL CARE IF:  You are unable to swallow your own saliva .  You are having shortness of breath or a fever, or both.  You have a hoarse voice along with difficulty swallowing. MAKE SURE YOU:  Understand these instructions.  Will watch your condition.  Will get help right away if you are not doing well or get worse.   This information is not intended to replace advice given to you by your health care provider. Make sure you discuss any questions you have with your health care provider.   Document Released: 04/13/2000 Document Revised: 05/07/2014 Document Reviewed: 10/03/2012 Elsevier Interactive Patient Education 2016 Else[employer] Inc.  Chest Wall Pain Chest wall pain is pain in or around the bones and muscles of your chest. Sometimes, an injury causes this pain. Sometimes, the cause may not be known. This pain may take several weeks or longer to get better. HOME CARE INSTRUCTIONS  Pay attention to any changes in your symptoms. Take these actions to help with  your pain:   Rest as told by your health care provider.   Avoid activities that cause pain. These include any activities that use your chest muscles or your abdominal and side muscles to lift heavy items.   If directed, apply ice to the painful area:  Put ice in a plastic bag.  Place a towel between your skin and the  bag.  Leave the ice on for 20 minutes, 2-3 times per day.  Take over-the-counter and prescription medicines only as told by your health care provider.  Do not use tobacco products, including cigarettes, chewing tobacco, and e-cigarettes. If you need help quitting, ask your health care provider.  Keep all follow-up visits as told by your health care provider. This is important. SEEK MEDICAL CARE IF:  You have a fever.  Your chest pain becomes worse.  You have new symptoms. SEEK IMMEDIATE MEDICAL CARE IF:  You have nausea or vomiting.  You feel sweaty or light-headed.  You have a cough with phlegm (sputum) or you cough up blood.  You develop shortness of breath.   This information is not intended to replace advice given to you by your health care provider. Make sure you discuss any questions you have with your health care provider.   Document Released: 04/16/2005 Document Revised: 01/05/2015 Document Reviewed: 07/12/2014 Elsevier Interactive Patient Education 2016 [employer].  Hypokalemia Hypokalemia means that the amount of potassium in the blood is lower than normal.Potassium is a chemical, called an electrolyte, that helps regulate the amount of fluid in the body. It also stimulates muscle contraction and helps nerves function properly.Most of the body's potassium is inside of cells, and only a very small amount is in the blood. Because the amount in the blood is so small, minor changes can be life-threatening. CAUSES  Antibiotics.  Diarrhea or vomiting.  Using laxatives too much, which can cause diarrhea.  Chronic kidney disease.  Water pills (diuretics).  Eating disorders (bulimia).  Low magnesium level.  Sweating a lot. SIGNS AND SYMPTOMS  Weakness.  Constipation.  Fatigue.  Muscle cramps.  Mental confusion.  Skipped heartbeats or irregular heartbeat (palpitations).  Tingling or numbness. DIAGNOSIS  Your health care provider can diagnose  hypokalemia with blood tests. In addition to checking your potassium level, your health care provider may also check other lab tests. TREATMENT Hypokalemia can be treated with potassium supplements taken by mouth or adjustments in your current medicines. If your potassium level is very low, you may need to get potassium through a vein (IV) and be monitored in the hospital. A diet high in potassium is also helpful. Foods high in potassium are:  Nuts, such as peanuts and pistachios.  Seeds, such as sunflower seeds and pumpkin seeds.  Peas, lentils, and lima beans.  Whole grain and bran cereals and breads.  Fresh fruit and vegetables, such as apricots, avocado, bananas, cantaloupe, kiwi, oranges, tomatoes, asparagus, and potatoes.  Orange and tomato juices.  Red meats.  Fruit yogurt. HOME CARE INSTRUCTIONS  Take all medicines as prescribed by your health care provider.  Maintain a healthy diet by including nutritious food, such as fruits, vegetables, nuts, whole grains, and lean meats.  If you are taking a laxative, be sure to follow the directions on the label. SEEK MEDICAL CARE IF:  Your weakness gets worse.  You feel your heart pounding or racing.  You are vomiting or having diarrhea.  You are diabetic and having trouble keeping your blood glucose in the normal range. SEEK  IMMEDIATE MEDICAL CARE IF:  You have chest pain, shortness of breath, or dizziness.  You are vomiting or having diarrhea for more than 2 days.  You faint. MAKE SURE YOU:   Understand these instructions.  Will watch your condition.  Will get help right away if you are not doing well or get worse.   This information is not intended to replace advice given to you by your health care provider. Make sure you discuss any questions you have with your health care provider.   Document Released: 04/16/2005 Document Revised: 05/07/2014 Document Reviewed: 10/17/2012 Elsevier Interactive Patient Education  [employer] Else[employer] Inc.

## 2015-05-16 ENCOUNTER — Encounter: Payer: Self-pay | Admitting: Internal Medicine

## 2015-05-16 ENCOUNTER — Other Ambulatory Visit (INDEPENDENT_AMBULATORY_CARE_PROVIDER_SITE_OTHER): Payer: BLUE CROSS/BLUE SHIELD

## 2015-05-16 ENCOUNTER — Ambulatory Visit (INDEPENDENT_AMBULATORY_CARE_PROVIDER_SITE_OTHER): Payer: BLUE CROSS/BLUE SHIELD | Admitting: Internal Medicine

## 2015-05-16 VITALS — BP 136/84 | HR 86 | Temp 98.3°F | Resp 14 | Ht 63.0 in | Wt 188.8 lb

## 2015-05-16 DIAGNOSIS — E1169 Type 2 diabetes mellitus with other specified complication: Secondary | ICD-10-CM

## 2015-05-16 DIAGNOSIS — Z23 Encounter for immunization: Secondary | ICD-10-CM

## 2015-05-16 DIAGNOSIS — E119 Type 2 diabetes mellitus without complications: Secondary | ICD-10-CM

## 2015-05-16 DIAGNOSIS — E669 Obesity, unspecified: Secondary | ICD-10-CM

## 2015-05-16 DIAGNOSIS — I10 Essential (primary) hypertension: Secondary | ICD-10-CM

## 2015-05-16 DIAGNOSIS — E559 Vitamin D deficiency, unspecified: Secondary | ICD-10-CM

## 2015-05-16 LAB — COMPREHENSIVE METABOLIC PANEL
ALK PHOS: 98 U/L (ref 39–117)
ALT: 17 U/L (ref 0–35)
AST: 18 U/L (ref 0–37)
Albumin: 4.2 g/dL (ref 3.5–5.2)
BUN: 13 mg/dL (ref 6–23)
CHLORIDE: 104 meq/L (ref 96–112)
CO2: 25 mEq/L (ref 19–32)
CREATININE: 0.95 mg/dL (ref 0.40–1.20)
Calcium: 8.9 mg/dL (ref 8.4–10.5)
GFR: 66.74 mL/min (ref >60.00)
GLUCOSE: 318 mg/dL — AB (ref 70–99)
Potassium: 2.9 mEq/L — ABNORMAL LOW (ref 3.5–5.1)
SODIUM: 142 meq/L (ref 135–145)
Total Bilirubin: 0.5 mg/dL (ref 0.2–1.2)
Total Protein: 7.1 g/dL (ref 6.0–8.3)

## 2015-05-16 LAB — HEMOGLOBIN A1C: Hgb A1c MFr Bld: 8.1 % — ABNORMAL HIGH (ref 4.6–6.5)

## 2015-05-16 LAB — LDL CHOLESTEROL, DIRECT: LDL DIRECT: 112 mg/dL

## 2015-05-16 LAB — LIPID PANEL
Cholesterol: 179 mg/dL (ref 0–200)
HDL: 37.6 mg/dL — ABNORMAL LOW (ref >39.00)
Total CHOL/HDL Ratio: 5

## 2015-05-16 MED ORDER — ALBIGLUTIDE 50 MG ~~LOC~~ PEN: 50.0000 mg | PEN_INJECTOR | SUBCUTANEOUS | Status: DC

## 2015-05-16 MED ORDER — DULAGLUTIDE 1.5 MG/0.5ML ~~LOC~~ SOAJ: 1.5000 mg | SUBCUTANEOUS | Status: DC

## 2015-05-16 NOTE — Progress Notes (Signed)
Pre visit review using our clinic review tool, if applicable. No additional management support is needed unless otherwise documented below in the visit note. 

## 2015-05-16 NOTE — Progress Notes (Signed)
   Subjective:    Patient ID: Latoya Cox, female    DOB: 02/26/1966, 50 y.o.   MRN: [id number]  HPI The patient is a 50 YO new female coming in for her diabetes. She has been out of tanzeum for the last month and her previous doctor has not done anything about it which caused her to switch. She is taking jardiance, tanzeum, lantus for her sugars. Denies known complications but has rare episodes of pain in her ankle. Denies numbness in her feet. Knows about getting her eye exam done. Her sugars have been high since she has been off the tanzeum like 200s. Previously her weight was down and her appetite was improved.   [facility], [facility] reviewed and updated.   Review of Systems  Constitutional: Positive for appetite change and unexpected weight change. Negative for fever, chills, activity change and fatigue.  HENT: Negative.   Eyes: Negative.   Respiratory: Negative for cough, chest tightness, shortness of breath and wheezing.   Cardiovascular: Negative for chest pain, palpitations and leg swelling.  Gastrointestinal: Negative for nausea, abdominal pain, diarrhea, constipation and abdominal distention.  Musculoskeletal: Negative.   Skin: Negative.   Neurological: Negative.   Psychiatric/Behavioral: Negative.       Objective:   Physical Exam  Constitutional: She is oriented to person, place, and time. She appears well-developed and well-nourished.  Overweight  HENT:  Head: Normocephalic and atraumatic.  Eyes: EOM are normal.  Neck: Normal range of motion.  Cardiovascular: Normal rate and regular rhythm.   No murmur heard. Pulmonary/Chest: Effort normal and breath sounds normal. No respiratory distress. She has no wheezes. She has no rales.  Abdominal: Soft. Bowel sounds are normal. She exhibits no distension. There is no tenderness. There is no rebound.  Musculoskeletal: She exhibits no edema.  Neurological: She is alert and oriented to person, place, and time. Coordination  normal.  Skin: Skin is warm and dry.  Psychiatric: She has a normal mood and affect.   Filed Vitals:   05/16/15 1526  BP: 136/84  Pulse: 86  Temp: 98.3 F (36.8 C)  TempSrc: Oral  Resp: 14  Height: 5\' 3"  (1.6 m)  Weight: 188 lb 12.8 oz (85.639 kg)  SpO2: 99%      Assessment & Plan:  Flu shot given at visit.

## 2015-05-16 NOTE — Patient Instructions (Signed)
I have sent in tanzeum and trulicity to the pharmacy as well as cards for both of them to see what the cost will be.   We can start the prior authorization once you try to fill the trulicity to get it covered.   We are checking your blood work today and will call you back with the results.  Diabetes and Standards of Medical Care Diabetes is complicated. You may find that your diabetes team includes a dietitian, nurse, diabetes educator, eye doctor, and more. To help everyone know what is going on and to help you get the care you deserve, the following schedule of care was developed to help keep you on track. Below are the tests, exams, vaccines, medicines, education, and plans you will need. HbA1c test This test shows how well you have controlled your glucose over the past 2-3 months. It is used to see if your diabetes management plan needs to be adjusted.   It is performed at least 2 times a year if you are meeting treatment goals.  It is performed 4 times a year if therapy has changed or if you are not meeting treatment goals. Blood pressure test  This test is performed at every routine medical visit. The goal is less than 140/90 mm Hg for most people, but 130/80 mm Hg in some cases. Ask your health care provider about your goal. Dental exam  Follow up with the dentist regularly. Eye exam  If you are diagnosed with type 1 diabetes as a child, get an exam upon reaching the age of 50 years or older and having had diabetes for 3-5 years. Yearly eye exams are recommended after that initial eye exam.  If you are diagnosed with type 1 diabetes as an adult, get an exam within 5 years of diagnosis and then yearly.  If you are diagnosed with type 2 diabetes, get an exam as soon as possible after the diagnosis and then yearly. Foot care exam  Visual foot exams are performed at every routine medical visit. The exams check for cuts, injuries, or other problems with the feet.  You should have a  complete foot exam performed every year. This exam includes an inspection of the structure and skin of your feet, a check of the pulses in your feet, and a check of the sensation in your feet.  Type 1 diabetes: The first exam is performed 5 years after diagnosis.  Type 2 diabetes: The first exam is performed at the time of diagnosis.  Check your feet nightly for cuts, injuries, or other problems with your feet. Tell your health care provider if anything is not healing. Kidney function test (urine microalbumin)  This test is performed once a year.  Type 1 diabetes: The first test is performed 5 years after diagnosis.  Type 2 diabetes: The first test is performed at the time of diagnosis.  A serum creatinine and estimated glomerular filtration rate (eGFR) test is done once a year to assess the level of chronic kidney disease (CKD), if present. Lipid profile (cholesterol, HDL, LDL, triglycerides)  Performed every 5 years for most people.  The goal for LDL is less than 100 mg/dL. If you are at high risk, the goal is less than 70 mg/dL.  The goal for HDL is 40 mg/dL-50 mg/dL for men and 50 mg/dL-60 mg/dL for women. An HDL cholesterol of 60 mg/dL or higher gives some protection against heart disease.  The goal for triglycerides is less than 150 mg/dL. Immunizations  The flu (influenza) vaccine is recommended yearly for every person 60 months of age or older who has diabetes.  The pneumonia (pneumococcal) vaccine is recommended for every person 26 years of age or older who has diabetes. Adults 27 years of age or older may receive the pneumonia vaccine as a series of two separate shots.  The hepatitis B vaccine is recommended for adults shortly after they have been diagnosed with diabetes.  The Tdap (tetanus, diphtheria, and pertussis) vaccine should be given:  According to normal childhood vaccination schedules, for children.  Every 10 years, for adults who have diabetes. Diabetes  self-management education  Education is recommended at diagnosis and ongoing as needed. Treatment plan  Your treatment plan is reviewed at every medical visit.   This information is not intended to replace advice given to you by your health care provider. Make sure you discuss any questions you have with your health care provider.   Document Released: 02/11/2009 Document Revised: 05/07/2014 Document Reviewed: 09/16/2012 Elsevier Interactive Patient Education [employer].

## 2015-05-17 ENCOUNTER — Other Ambulatory Visit: Payer: Self-pay | Admitting: Internal Medicine

## 2015-05-17 DIAGNOSIS — E559 Vitamin D deficiency, unspecified: Secondary | ICD-10-CM | POA: Insufficient documentation

## 2015-05-17 DIAGNOSIS — E669 Obesity, unspecified: Principal | ICD-10-CM

## 2015-05-17 DIAGNOSIS — E1169 Type 2 diabetes mellitus with other specified complication: Secondary | ICD-10-CM | POA: Insufficient documentation

## 2015-05-17 DIAGNOSIS — I10 Essential (primary) hypertension: Secondary | ICD-10-CM | POA: Insufficient documentation

## 2015-05-17 MED ORDER — POTASSIUM CHLORIDE CRYS ER 20 MEQ PO TBCR: 20.0000 meq | EXTENDED_RELEASE_TABLET | Freq: Every day | ORAL | Status: DC

## 2015-05-17 NOTE — Assessment & Plan Note (Signed)
Has been taking high dose supplementation for some time. Will get records and address as appropriate. Did not check level at visit since she was not sure last time checked.

## 2015-05-17 NOTE — Assessment & Plan Note (Signed)
Checking HgA1c although suspect poor control off her meds and with her reported sugars of high 200s since being off tanzeum. Rx for tanzeum and trulicity given today with savings cards to see if they will work. She is also taking jardiance and lantus. She is taking 40 units lantus at night time. Denies any numbness or tingling and not known to have complication. Needs eye exam. Foot exam done today without findings.

## 2015-05-17 NOTE — Assessment & Plan Note (Signed)
Is on amlodipine, valsartan, clonidine for her BP and at goal today. Checking labs for any need for change and address as appropriate.

## 2015-05-25 ENCOUNTER — Telehealth: Payer: Self-pay | Admitting: Internal Medicine

## 2015-05-25 NOTE — Telephone Encounter (Signed)
Rec'd from [employer] forward 75 pages to Dr. Okey Dupre

## 2015-06-07 ENCOUNTER — Telehealth: Payer: Self-pay | Admitting: *Deleted

## 2015-06-07 MED ORDER — INSULIN GLARGINE 100 UNIT/ML SOLOSTAR PEN: 40.0000 [IU] | PEN_INJECTOR | Freq: Every day | SUBCUTANEOUS | Status: DC

## 2015-06-07 NOTE — Telephone Encounter (Signed)
Receive call pt is needing refill on her Lantus Solarstar. [facility]

## 2015-06-20 ENCOUNTER — Telehealth: Payer: Self-pay | Admitting: *Deleted

## 2015-06-20 MED ORDER — DICLOFENAC 35 MG PO CAPS: 35.0000 mg | ORAL_CAPSULE | Freq: Three times a day (TID) | ORAL | Status: DC | PRN

## 2015-06-20 MED ORDER — CLONIDINE HCL 0.1 MG PO TABS: 0.1000 mg | ORAL_TABLET | Freq: Every day | ORAL | Status: DC

## 2015-06-20 MED ORDER — AMLODIPINE BESYLATE-VALSARTAN 10-320 MG PO TABS: 1.0000 | ORAL_TABLET | Freq: Every day | ORAL | Status: DC

## 2015-06-20 NOTE — Telephone Encounter (Signed)
Receive call pt states she is needing refills on her BP med exforge & clonidine, and diclofenac. Verified [facility]

## 2015-08-16 ENCOUNTER — Ambulatory Visit: Payer: BLUE CROSS/BLUE SHIELD | Admitting: Internal Medicine

## 2015-08-26 ENCOUNTER — Other Ambulatory Visit: Payer: Self-pay

## 2015-08-26 MED ORDER — POTASSIUM CHLORIDE CRYS ER 20 MEQ PO TBCR: 20.0000 meq | EXTENDED_RELEASE_TABLET | Freq: Every day | ORAL | Status: DC

## 2015-08-30 ENCOUNTER — Other Ambulatory Visit (INDEPENDENT_AMBULATORY_CARE_PROVIDER_SITE_OTHER): Payer: BLUE CROSS/BLUE SHIELD

## 2015-08-30 ENCOUNTER — Ambulatory Visit (INDEPENDENT_AMBULATORY_CARE_PROVIDER_SITE_OTHER): Payer: BLUE CROSS/BLUE SHIELD | Admitting: Internal Medicine

## 2015-08-30 ENCOUNTER — Encounter: Payer: Self-pay | Admitting: Internal Medicine

## 2015-08-30 ENCOUNTER — Other Ambulatory Visit: Payer: Self-pay

## 2015-08-30 VITALS — BP 110/80 | HR 76 | Temp 97.7°F | Ht 63.0 in | Wt 190.0 lb

## 2015-08-30 DIAGNOSIS — Z794 Long term (current) use of insulin: Secondary | ICD-10-CM

## 2015-08-30 DIAGNOSIS — E1165 Type 2 diabetes mellitus with hyperglycemia: Secondary | ICD-10-CM

## 2015-08-30 DIAGNOSIS — E119 Type 2 diabetes mellitus without complications: Secondary | ICD-10-CM | POA: Diagnosis not present

## 2015-08-30 DIAGNOSIS — E559 Vitamin D deficiency, unspecified: Secondary | ICD-10-CM

## 2015-08-30 DIAGNOSIS — IMO0001 Reserved for inherently not codable concepts without codable children: Secondary | ICD-10-CM

## 2015-08-30 DIAGNOSIS — E1169 Type 2 diabetes mellitus with other specified complication: Secondary | ICD-10-CM

## 2015-08-30 DIAGNOSIS — E669 Obesity, unspecified: Secondary | ICD-10-CM

## 2015-08-30 LAB — BASIC METABOLIC PANEL
BUN: 14 mg/dL (ref 6–23)
CHLORIDE: 105 meq/L (ref 96–112)
CO2: 29 mEq/L (ref 19–32)
CREATININE: 0.72 mg/dL (ref 0.40–1.20)
Calcium: 9.3 mg/dL (ref 8.4–10.5)
GFR: 91.8 mL/min (ref >60.00)
GLUCOSE: 146 mg/dL — AB (ref 70–99)
POTASSIUM: 2.9 meq/L — AB (ref 3.5–5.1)
Sodium: 143 mEq/L (ref 135–145)

## 2015-08-30 LAB — HEMOGLOBIN A1C: Hgb A1c MFr Bld: 8.2 % — ABNORMAL HIGH (ref 4.6–6.5)

## 2015-08-30 LAB — VITAMIN D 25 HYDROXY (VIT D DEFICIENCY, FRACTURES): VITD: 46.67 ng/mL (ref 30.00–100.00)

## 2015-08-30 MED ORDER — CLEVER CHEK LANCETS MISC: Status: AC

## 2015-08-30 MED ORDER — GLUCOSE BLOOD VI STRP: ORAL_STRIP | Status: DC

## 2015-08-30 MED ORDER — NYSTATIN-TRIAMCINOLONE 100000-0.1 UNIT/GM-% EX OINT: 1.0000 "application " | TOPICAL_OINTMENT | Freq: Two times a day (BID) | CUTANEOUS | Status: DC

## 2015-08-30 MED ORDER — DICLOFENAC 35 MG PO CAPS: 35.0000 mg | ORAL_CAPSULE | Freq: Two times a day (BID) | ORAL | Status: DC | PRN

## 2015-08-30 MED ORDER — EMPAGLIFLOZIN 25 MG PO TABS: 25.0000 mg | ORAL_TABLET | Freq: Every day | ORAL | Status: DC

## 2015-08-30 NOTE — Assessment & Plan Note (Signed)
This is uncontrolled, checking HgA1c after restarting tanzeum. She is also taking jardiance and lantus 40 units qhs. She has tried metformin and does not want to take again due to diarrhea. On ARB. Not complicated that we know of. Needs eye exam and reminded. Referral placed for nutrition for teaching as her diet is not ideal.

## 2015-08-30 NOTE — Progress Notes (Signed)
   Subjective:    Patient ID: Latoya Cox, female    DOB: 09/22/1965, 50 y.o.   MRN: [id number]  HPI The patient is a 50 YO female coming in for follow up of her diabetes. She has gone back on tanzeum since last visit. This is helping with her appetite but her weight is up about 1 pound since last time. Not exercising and has not been checking her sugars. She has a brand new meter which she has never used but does not have strips. Thinks they may be running a little high. No new numbness in her feet. Has not been to the eye doctor. No low blood sugars.   Review of Systems  Constitutional: Positive for unexpected weight change. Negative for fever, chills, activity change and fatigue.  HENT: Negative.   Eyes: Negative.   Respiratory: Negative for cough, chest tightness, shortness of breath and wheezing.   Cardiovascular: Negative for chest pain, palpitations and leg swelling.  Gastrointestinal: Negative for nausea, abdominal pain, diarrhea, constipation and abdominal distention.  Musculoskeletal: Negative.   Skin: Negative.   Neurological: Negative.   Psychiatric/Behavioral: Negative.       Objective:   Physical Exam  Constitutional: She is oriented to person, place, and time. She appears well-developed and well-nourished.  Overweight  HENT:  Head: Normocephalic and atraumatic.  Eyes: EOM are normal.  Neck: Normal range of motion.  Cardiovascular: Normal rate and regular rhythm.   No murmur heard. Pulmonary/Chest: Effort normal and breath sounds normal. No respiratory distress. She has no wheezes. She has no rales.  Abdominal: Soft. Bowel sounds are normal. She exhibits no distension. There is no tenderness. There is no rebound.  Musculoskeletal: She exhibits no edema.  Neurological: She is alert and oriented to person, place, and time. Coordination normal.  Skin: Skin is warm and dry.  Psychiatric: She has a normal mood and affect.   Filed Vitals:   08/30/15 0821  BP: 110/80    Pulse: 76  Temp: 97.7 F (36.5 C)  TempSrc: Oral  Height: 5\' 3"  (1.6 m)  Weight: 190 lb (86.183 kg)  SpO2: 96%      Assessment & Plan:

## 2015-08-30 NOTE — Progress Notes (Signed)
Pre visit review using our clinic review tool, if applicable. No additional management support is needed unless otherwise documented below in the visit note. 

## 2015-08-30 NOTE — Patient Instructions (Signed)
We are checking the blood work today and will call you back with the results.   We will have you talk to the nutritionist for diabetes.   We are checking the vitamin D labs and likely will stop the high dose vitamin D as you are not supposed to be on that long term. It is used for a short time to help build up the levels.   Diabetes and Exercise Exercising regularly is important. It is not just about losing weight. It has many health benefits, such as:  Improving your overall fitness, flexibility, and endurance.  Increasing your bone density.  Helping with weight control.  Decreasing your body fat.  Increasing your muscle strength.  Reducing stress and tension.  Improving your overall health. People with diabetes who exercise gain additional benefits because exercise:  Reduces appetite.  Improves the body's use of blood sugar (glucose).  Helps lower or control blood glucose.  Decreases blood pressure.  Helps control blood lipids (such as cholesterol and triglycerides).  Improves the body's use of the hormone insulin by:  Increasing the body's insulin sensitivity.  Reducing the body's insulin needs.  Decreases the risk for heart disease because exercising:  Lowers cholesterol and triglycerides levels.  Increases the levels of good cholesterol (such as high-density lipoproteins [HDL]) in the body.  Lowers blood glucose levels. YOUR ACTIVITY PLAN  Choose an activity that you enjoy, and set realistic goals. To exercise safely, you should begin practicing any new physical activity slowly, and gradually increase the intensity of the exercise over time. Your health care provider or diabetes educator can help create an activity plan that works for you. General recommendations include:  Encouraging children to engage in at least 60 minutes of physical activity each day.  Stretching and performing strength training exercises, such as yoga or weight lifting, at least 2 times  per week.  Performing a total of at least 150 minutes of moderate-intensity exercise each week, such as brisk walking or water aerobics.  Exercising at least 3 days per week, making sure you allow no more than 2 consecutive days to pass without exercising.  Avoiding long periods of inactivity (90 minutes or more). When you have to spend an extended period of time sitting down, take frequent breaks to walk or stretch. RECOMMENDATIONS FOR EXERCISING WITH TYPE 1 OR TYPE 2 DIABETES   Check your blood glucose before exercising. If blood glucose levels are greater than 240 mg/dL, check for urine ketones. Do not exercise if ketones are present.  Avoid injecting insulin into areas of the body that are going to be exercised. For example, avoid injecting insulin into:  The arms when playing tennis.  The legs when jogging.  Keep a record of:  Food intake before and after you exercise.  Expected peak times of insulin action.  Blood glucose levels before and after you exercise.  The type and amount of exercise you have done.  Review your records with your health care provider. Your health care provider will help you to develop guidelines for adjusting food intake and insulin amounts before and after exercising.  If you take insulin or oral hypoglycemic agents, watch for signs and symptoms of hypoglycemia. They include:  Dizziness.  Shaking.  Sweating.  Chills.  Confusion.  Drink plenty of water while you exercise to prevent dehydration or heat stroke. Body water is lost during exercise and must be replaced.  Talk to your health care provider before starting an exercise program to make sure it  is safe for you. Remember, almost any type of activity is better than none.   This information is not intended to replace advice given to you by your health care provider. Make sure you discuss any questions you have with your health care provider.   Document Released: 07/07/2003 Document  Revised: 08/31/2014 Document Reviewed: 09/23/2012 Elsevier Interactive Patient Education [employer]

## 2015-08-30 NOTE — Assessment & Plan Note (Signed)
Check vitamin D level today and likely stop high dose replacement.

## 2015-09-02 ENCOUNTER — Other Ambulatory Visit: Payer: Self-pay | Admitting: Geriatric Medicine

## 2015-09-02 MED ORDER — DICLOFENAC 35 MG PO CAPS: 35.0000 mg | ORAL_CAPSULE | Freq: Two times a day (BID) | ORAL | Status: DC | PRN

## 2015-09-29 ENCOUNTER — Other Ambulatory Visit: Payer: Self-pay | Admitting: Internal Medicine

## 2015-10-10 ENCOUNTER — Encounter (HOSPITAL_COMMUNITY): Payer: Self-pay | Admitting: Emergency Medicine

## 2015-10-10 ENCOUNTER — Emergency Department (HOSPITAL_COMMUNITY): Payer: BLUE CROSS/BLUE SHIELD

## 2015-10-10 ENCOUNTER — Emergency Department (HOSPITAL_COMMUNITY)
Admission: EM | Admit: 2015-10-10 | Discharge: 2015-10-10 | Disposition: A | Discharge status: Home / Self Care | Payer: BLUE CROSS/BLUE SHIELD | Attending: Emergency Medicine | Admitting: Emergency Medicine

## 2015-10-10 DIAGNOSIS — Z79899 Other long term (current) drug therapy: Secondary | ICD-10-CM | POA: Insufficient documentation

## 2015-10-10 DIAGNOSIS — I10 Essential (primary) hypertension: Secondary | ICD-10-CM | POA: Diagnosis not present

## 2015-10-10 DIAGNOSIS — Z794 Long term (current) use of insulin: Secondary | ICD-10-CM | POA: Diagnosis not present

## 2015-10-10 DIAGNOSIS — R0789 Other chest pain: Secondary | ICD-10-CM | POA: Insufficient documentation

## 2015-10-10 DIAGNOSIS — Z7984 Long term (current) use of oral hypoglycemic drugs: Secondary | ICD-10-CM | POA: Diagnosis not present

## 2015-10-10 DIAGNOSIS — Z7982 Long term (current) use of aspirin: Secondary | ICD-10-CM | POA: Diagnosis not present

## 2015-10-10 DIAGNOSIS — E119 Type 2 diabetes mellitus without complications: Secondary | ICD-10-CM | POA: Diagnosis not present

## 2015-10-10 LAB — COMPREHENSIVE METABOLIC PANEL
ALT: 21 U/L (ref 14–54)
ANION GAP: 9 (ref 5–15)
AST: 25 U/L (ref 15–41)
Albumin: 3.8 g/dL (ref 3.5–5.0)
Alkaline Phosphatase: 93 U/L (ref 38–126)
BUN: 7 mg/dL (ref 6–20)
CHLORIDE: 107 mmol/L (ref 101–111)
CO2: 25 mmol/L (ref 22–32)
Calcium: 8.9 mg/dL (ref 8.9–10.3)
Creatinine, Ser: 0.66 mg/dL (ref 0.44–1.00)
GFR calc non Af Amer: >60 mL/min (ref >60)
Glucose, Bld: 200 mg/dL — ABNORMAL HIGH (ref 65–99)
POTASSIUM: 3.3 mmol/L — AB (ref 3.5–5.1)
SODIUM: 141 mmol/L (ref 135–145)
Total Bilirubin: 1 mg/dL (ref 0.3–1.2)
Total Protein: 6.6 g/dL (ref 6.5–8.1)

## 2015-10-10 LAB — CBC WITH DIFFERENTIAL/PLATELET
Basophils Absolute: 0.1 10*3/uL (ref 0.0–0.1)
Basophils Relative: 1 %
EOS ABS: 0.1 10*3/uL (ref 0.0–0.7)
EOS PCT: 1 %
HCT: 43.2 % (ref 36.0–46.0)
Hemoglobin: 14.1 g/dL (ref 12.0–15.0)
LYMPHS ABS: 2.7 10*3/uL (ref 0.7–4.0)
Lymphocytes Relative: 29 %
MCH: 27 pg (ref 26.0–34.0)
MCHC: 32.6 g/dL (ref 30.0–36.0)
MCV: 82.6 fL (ref 78.0–100.0)
Monocytes Absolute: 0.7 10*3/uL (ref 0.1–1.0)
Monocytes Relative: 7 %
Neutro Abs: 5.9 10*3/uL (ref 1.7–7.7)
Neutrophils Relative %: 62 %
PLATELETS: 202 10*3/uL (ref 150–400)
RBC: 5.23 MIL/uL — AB (ref 3.87–5.11)
RDW: 13.8 % (ref 11.5–15.5)
WBC: 9.4 10*3/uL (ref 4.0–10.5)

## 2015-10-10 LAB — I-STAT TROPONIN, ED: Troponin i, poc: 0.01 ng/mL (ref 0.00–0.08)

## 2015-10-10 LAB — TROPONIN I

## 2015-10-10 MED ORDER — TRAMADOL HCL 50 MG PO TABS
50.0000 mg | ORAL_TABLET | Freq: Once | ORAL | Status: AC
Start: 2015-10-10 — End: 2015-10-10
  Administered 2015-10-10: 50 mg via ORAL
  Filled 2015-10-10: qty 1

## 2015-10-10 MED ORDER — TRAMADOL HCL 50 MG PO TABS: 50.0000 mg | ORAL_TABLET | Freq: Four times a day (QID) | ORAL | Status: DC | PRN

## 2015-10-10 NOTE — ED Notes (Signed)
PA at bedside.

## 2015-10-10 NOTE — Discharge Instructions (Signed)

## 2015-10-10 NOTE — ED Notes (Signed)
Pt ambulates independently and with steady gait at time of discharge. Discharge instructions and follow up information reviewed with patient. No other questions or concerns voiced at this time.  

## 2015-10-10 NOTE — ED Provider Notes (Signed)
CSN: [id number]     Arrival date & time 10/10/15  1619 History   First MD Initiated Contact with Patient 10/10/15 2115     Chief Complaint  Patient presents with  . Chest Pain     (Consider location/radiation/quality/duration/timing/severity/associated sxs/prior Treatment) HPI    PCP: Myrlene Broker, MD Latoya Cox is a 50 y.o.  female  Patient has a PMH of chest wall pain, diabetes, hypertension, and arthritis. She also has a history of aneurism repair.  She is under and extensive amount of stress because her 17 year old daughter keeps running away, she woke up with right sided chest wall pain that has been on and off with some mild associated nausea. She can reproduce it by pushing on her chest. She has been seen for this same pain before in the past at [facility] ER and told it was chest wall pain. She has been referred to a cardiologist n the past but did not go because she was scared. Her husband gave her aspirin 324 mg prior to arrival to the ED. Exertion or rest does not make it better or worse. Denies LE swelling. Describes the pain as a pressure, does not radiate anywhere.  ROS: The patient denies diaphoresis, fever, headache, weakness (general or focal), confusion, change of vision,  dysphagia, aphagia, shortness of breath,  abdominal pains, vomiting, diarrhea, lower extremity swelling, rash, neck pain,   Past Medical History  Diagnosis Date  . Chest wall pain   . Diabetes mellitus without complication (HCC)   . Hypertension   . Arthritis    Past Surgical History  Procedure Laterality Date  . Aneurism repair     Family History  Problem Relation Age of Onset  . Stroke Mother   . Hypertension Mother   . Arthritis Mother   . Diabetes Mother   . Arthritis Father   . Hypertension Father   . Diabetes Father    Social History  Substance Use Topics  . Smoking status: Never Smoker   . Smokeless tobacco: Never Used  . Alcohol Use: No   OB History    Gravida  Para Term Preterm AB TAB SAB Ectopic Multiple Living   3    3  3         Review of Systems  Review of Systems All other systems negative except as documented in the HPI. All pertinent positives and negatives as reviewed in the HPI.   Allergies  Hydrocodone  Home Medications   Prior to Admission medications   Medication Sig Start Date End Date Taking? Authorizing Provider  amLODipine-valsartan (EXFORGE) 10-320 MG tablet Take 1 tablet by mouth daily. 06/20/15   Myrlene Broker, MD  aspirin 81 MG chewable tablet Chew 81 mg by mouth daily.    Historical Provider, MD  CLEVER CHEK LANCETS MISC Use daily to check sugars. 08/30/15   Myrlene Broker, MD  cloNIDine (CATAPRES) 0.1 MG tablet Take 1 tablet (0.1 mg total) by mouth at bedtime. 06/20/15   Myrlene Broker, MD  Diclofenac 35 MG CAPS Take 35 mg by mouth 2 (two) times daily as needed (for pain). 09/02/15   Myrlene Broker, MD  Doxylamine Succinate, Sleep, (SLEEP AID PO) Take 1 tablet by mouth at bedtime.    Historical Provider, MD  empagliflozin (JARDIANCE) 25 MG TABS tablet Take 25 mg by mouth daily. 08/30/15   Myrlene Broker, MD  fluticasone (FLONASE) 50 MCG/ACT nasal spray Place 1 spray into both nostrils daily as  needed for allergies.     Historical Provider, MD  glucose blood (CLEVER CHOICE MICRO TEST) test strip Use as instructed 08/30/15   Myrlene BrokerElizabeth A Crawford, MD  Insulin Glargine (LANTUS SOLOSTAR) 100 UNIT/ML Solostar Pen Inject 40 Units into the skin at bedtime. 06/07/15   Myrlene BrokerElizabeth A Crawford, MD  nystatin-triamcinolone ointment [facility](MYCOLOG) Apply 1 application topically 2 (two) times daily. 08/30/15   Myrlene BrokerElizabeth A Crawford, MD  omeprazole (PRILOSEC) 40 MG capsule Take 40 mg by mouth 2 (two) times daily. 08/30/14   Historical Provider, MD  ondansetron (ZOFRAN) 8 MG tablet Take 8 mg by mouth daily as needed for nausea or vomiting.  08/30/14   Historical Provider, MD  potassium chloride SA (K-DUR,KLOR-CON) 20 MEQ tablet Take 1  tablet (20 mEq total) by mouth daily. 08/26/15   Myrlene BrokerElizabeth A Crawford, MD  TANZEUM 50 MG PEN INJECT 50 MG INTO THE SKIN ONCE A WEEK. 09/30/15   Myrlene BrokerElizabeth A Crawford, MD  traMADol (ULTRAM) 50 MG tablet Take 1 tablet (50 mg total) by mouth every 6 (six) hours as needed. 10/10/15   Marlon Peliffany Keishawn Rajewski, PA-C  Vitamin D, Ergocalciferol, (DRISDOL) 50000 UNITS CAPS capsule Take 50,000 Units by mouth every Monday. 08/23/14   Historical Provider, MD   BP 111/91 mmHg  Pulse 88  Temp(Src) 98.1 F (36.7 C) (Oral)  Resp 12  Ht 5\' 3"  (1.6 m)  Wt 89.359 kg  BMI 34.91 kg/m2  SpO2 100% Physical Exam  Constitutional: She appears well-developed and well-nourished. No distress.  HENT:  Head: Normocephalic and atraumatic.  Right Ear: Tympanic membrane and ear canal normal.  Left Ear: Tympanic membrane and ear canal normal.  Nose: Nose normal.  Mouth/Throat: Uvula is midline, oropharynx is clear and moist and mucous membranes are normal.  Eyes: Pupils are equal, round, and reactive to light.  Neck: Normal range of motion. Neck supple.  Cardiovascular: Normal rate and regular rhythm.   Pulmonary/Chest: Effort normal and breath sounds normal. No respiratory distress. She has no decreased breath sounds. She has no wheezes. She has no rhonchi. She exhibits tenderness and bony tenderness.    Abdominal: Soft.  No signs of abdominal distention  Musculoskeletal:  No LE swelling  Neurological: She is alert.  Acting at baseline  Skin: Skin is warm and dry. No rash noted.  Nursing note and vitals reviewed.   ED Course  Procedures (including critical care time) Labs Review Labs Reviewed  CBC WITH DIFFERENTIAL/PLATELET - Abnormal; Notable for the following:    RBC 5.23 (*)    All other components within normal limits  COMPREHENSIVE METABOLIC PANEL - Abnormal; Notable for the following:    Potassium 3.3 (*)    Glucose, Bld 200 (*)    All other components within normal limits  TROPONIN I  Rosezena SensorI-STAT TROPOININ, ED     Imaging Review Dg Chest 2 View  10/10/2015  CLINICAL DATA:  Patient with right-sided chest pain since this morning. EXAM: CHEST  2 VIEW COMPARISON:  Chest radiographs 04/01/2009; 10/18/2010 and 05/09/2015. FINDINGS: Normal cardiac and mediastinal contours. No consolidative pulmonary opacities. No pleural effusion or pneumothorax. Regional skeleton is unremarkable. IMPRESSION: No active cardiopulmonary disease. Electronically Signed   By: Annia Beltrew  Davis M.D.   On: 10/10/2015 17:17   I have personally reviewed and evaluated these images and lab results as part of my medical decision-making.   EKG Interpretation   Date/Time:  Monday October 10 2015 16:25:04 EDT Ventricular Rate:  94 PR Interval:  142 QRS Duration: 88 QT Interval:  388 QTC Calculation: 485 R Axis:   -25 Text Interpretation:  Sinus rhythm with occasional Premature ventricular  complexes Possible Left atrial enlargement Left ventricular hypertrophy  Cannot rule out Septal infarct , age undetermined Abnormal ECG Confirmed  by COOK  MD, BRIAN ([id number]) on 10/10/2015 11:10:19 PM      MDM   Final diagnoses:  Chest wall pain    Patient is to be discharged with recommendation to follow up with PCP in regards to today's hospital visit. Chest pain is not likely of cardiac or pulmonary etiology d/t presentation, perc negative, VSS, no tracheal deviation, no JVD or new murmur, RRR, breath sounds equal bilaterally, EKG without acute abnormalities, negative troponin x 2, and negative CXR. Pt has been advised  return to the ED is CP becomes exertional, associated with diaphoresis or nausea, radiates to left jaw/arm, worsens or becomes concerning in any way. Pt appears reliable for follow up and is agreeable to discharge.   Case has been discussed with and seen by Dr. Adriana Simas who agrees with the above plan to discharge.    Marlon Pel, PA-C 10/10/15 2318  Donnetta Hutching, MD 10/11/15 [phone]

## 2015-10-10 NOTE — ED Notes (Signed)
PT c/o left chest pain off and on since this am.  St's has vomited x's 1.  C/O nausea.  Pt st's has been under a lot of stress lately

## 2015-11-21 ENCOUNTER — Other Ambulatory Visit: Payer: Self-pay | Admitting: Internal Medicine

## 2015-12-19 ENCOUNTER — Other Ambulatory Visit: Payer: Self-pay | Admitting: Internal Medicine

## 2016-01-04 ENCOUNTER — Telehealth: Payer: Self-pay | Admitting: *Deleted

## 2016-01-04 MED ORDER — GLUCOSE BLOOD VI STRP: 1.0000 | ORAL_STRIP | Freq: Two times a day (BID) | 3 refills | Status: DC

## 2016-01-04 MED ORDER — ONETOUCH DELICA LANCETS 33G MISC: 3 refills | Status: DC

## 2016-01-04 NOTE — Telephone Encounter (Signed)
Left msg on triage stating she is needing to pick up blood sugars monitor so she can check her BS. Called pt back no answer LMOM will leave BS monitor for pick-up [facility].../[facility]

## 2016-01-16 ENCOUNTER — Other Ambulatory Visit: Payer: Self-pay | Admitting: Internal Medicine

## 2016-01-24 ENCOUNTER — Ambulatory Visit (HOSPITAL_COMMUNITY)
Admission: EM | Admit: 2016-01-24 | Discharge: 2016-01-24 | Disposition: A | Discharge status: Home / Self Care | Payer: BLUE CROSS/BLUE SHIELD | Attending: Family Medicine | Admitting: Family Medicine

## 2016-01-24 ENCOUNTER — Ambulatory Visit (INDEPENDENT_AMBULATORY_CARE_PROVIDER_SITE_OTHER): Payer: BLUE CROSS/BLUE SHIELD

## 2016-01-24 ENCOUNTER — Encounter (HOSPITAL_COMMUNITY): Payer: Self-pay | Admitting: Emergency Medicine

## 2016-01-24 DIAGNOSIS — G44219 Episodic tension-type headache, not intractable: Secondary | ICD-10-CM

## 2016-01-24 DIAGNOSIS — R229 Localized swelling, mass and lump, unspecified: Secondary | ICD-10-CM

## 2016-01-24 MED ORDER — IBUPROFEN 800 MG PO TABS
800.0000 mg | ORAL_TABLET | Freq: Once | ORAL | Status: AC
  Administered 2016-01-24: 800 mg via ORAL

## 2016-01-24 MED ORDER — IBUPROFEN 800 MG PO TABS
ORAL_TABLET | ORAL | Status: AC
  Filled 2016-01-24: qty 1

## 2016-01-24 NOTE — Discharge Instructions (Signed)
The soft tissue neck xray does not find any acute problems  I would suggest symptomatic treatment at this time and follow up with your doctor tomorrow, for possible ultrasound.  Return to work Friday.

## 2016-01-24 NOTE — ED Triage Notes (Signed)
[facility] a [facility] with nausea and dizziness. The patient also reported swelling around her neck that started around the same time as the headache.

## 2016-01-24 NOTE — ED Provider Notes (Signed)
CSN: [id number]     Arrival date & time 01/24/16  1738 History   None    Chief Complaint  Patient presents with  . Headache   (Consider location/radiation/quality/duration/timing/severity/associated sxs/prior Treatment) HPI 50 y/o female with onset of throbbing HA this morning. Unable to work. Took tylenol without relief. Awoke this afternoon with swelling above clavicle. No pain. Sugar over 300, did not take meds yesterday. Hist of aneurysm repair. Not assoc with nausea, vision changes, weakness or other neurology. Past Medical History:  Diagnosis Date  . Arthritis   . Chest wall pain   . Diabetes mellitus without complication (HCC)   . Hypertension    Past Surgical History:  Procedure Laterality Date  . aneurism repair     Family History  Problem Relation Age of Onset  . Stroke Mother   . Hypertension Mother   . Arthritis Mother   . Diabetes Mother   . Arthritis Father   . Hypertension Father   . Diabetes Father    Social History  Substance Use Topics  . Smoking status: Never Smoker  . Smokeless tobacco: Never Used  . Alcohol use No   OB History    Gravida Para Term Preterm AB Living   3       3     SAB TAB Ectopic Multiple Live Births   3             Review of Systems  Denies:  NAUSEA, ABDOMINAL PAIN, CHEST PAIN, CONGESTION, DYSURIA, SHORTNESS OF BREATH  Allergies  Hydrocodone  Home Medications   Prior to Admission medications   Medication Sig Start Date End Date Taking? Authorizing Provider  amLODipine-valsartan (EXFORGE) 10-320 MG tablet TAKE ONE TABLET BY MOUTH EVERY DAY 01/16/16   Myrlene BrokerElizabeth A Crawford, MD  aspirin 81 MG chewable tablet Chew 81 mg by mouth daily.    Historical Provider, MD  CLEVER CHEK LANCETS MISC Use daily to check sugars. 08/30/15   Myrlene BrokerElizabeth A Crawford, MD  cloNIDine (CATAPRES) 0.1 MG tablet TAKE ONE TABLET BY MOUTH AT BEDTIME 01/16/16   Myrlene BrokerElizabeth A Crawford, MD  Diclofenac 35 MG CAPS Take 35 mg by mouth 2 (two) times daily as needed  (for pain). 09/02/15   Myrlene BrokerElizabeth A Crawford, MD  Doxylamine Succinate, Sleep, (SLEEP AID PO) Take 1 tablet by mouth at bedtime.    Historical Provider, MD  empagliflozin (JARDIANCE) 25 MG TABS tablet Take 25 mg by mouth daily. 08/30/15   Myrlene BrokerElizabeth A Crawford, MD  fluticasone (FLONASE) 50 MCG/ACT nasal spray Place 1 spray into both nostrils daily as needed for allergies.     Historical Provider, MD  glucose blood (CLEVER CHOICE MICRO TEST) test strip Use as instructed 08/30/15   Myrlene BrokerElizabeth A Crawford, MD  glucose blood (ONETOUCH VERIO) test strip 1 each by Other route 2 (two) times daily. Use to check blood sugars twice a day Dx E11.9 01/04/16   Myrlene BrokerElizabeth A Crawford, MD  Insulin Glargine (LANTUS SOLOSTAR) 100 UNIT/ML Solostar Pen Inject 40 Units into the skin at bedtime. 06/07/15   Myrlene BrokerElizabeth A Crawford, MD  nystatin-triamcinolone ointment (MYCOLOG) APPLY TO THE AFFECTED AREA TWICE DAILY 01/16/16   Myrlene BrokerElizabeth A Crawford, MD  omeprazole (PRILOSEC) 40 MG capsule Take 40 mg by mouth 2 (two) times daily. 08/30/14   Historical Provider, MD  ondansetron (ZOFRAN) 8 MG tablet Take 8 mg by mouth daily as needed for nausea or vomiting.  08/30/14   Historical Provider, MD  Dola ArgyleNETOUCH DELICA LANCETS 33G MISC Use to help  check blood sugars twice a day Dx E11.9 01/04/16   Myrlene Broker, MD  potassium chloride SA (K-DUR,KLOR-CON) 20 MEQ tablet TAKE ONE TABLET BY MOUTH EVERY DAY 01/16/16   Myrlene Broker, MD  TANZEUM 50 MG PEN INJECT 50 MG INTO THE SKIN ONCE A WEEK. 09/30/15   Myrlene Broker, MD  traMADol (ULTRAM) 50 MG tablet Take 1 tablet (50 mg total) by mouth every 6 (six) hours as needed. 10/10/15   Marlon Pel, PA-C  Vitamin D, Ergocalciferol, (DRISDOL) 50000 UNITS CAPS capsule Take 50,000 Units by mouth every Monday. 08/23/14   Historical Provider, MD   Meds Ordered and Administered this Visit   Medications  ibuprofen (ADVIL,MOTRIN) tablet 800 mg (not administered)    BP 121/82 (BP Location: Left Arm)    Pulse 95   Temp 98.4 F (36.9 C) (Oral)   Resp 14   SpO2 100%  No data found.   Physical Exam NURSES NOTES AND VITAL SIGNS REVIEWED. CONSTITUTIONAL: Well developed, well nourished, no acute distress HEENT: normocephalic, atraumatic EYES: Conjunctiva normal, fund exam visible pulsations  NECK:normal ROM, supple, no adenopathy PULMONARY:No respiratory distress, normal effort ABDOMINAL: Soft, ND, NT BS+, No CVAT MUSCULOSKELETAL: Normal ROM of all extremities,  SKIN: warm and dry without rash PSYCHIATRIC: Mood and affect, behavior are normal NEUROLOGICAL SCREENING EXAM: Constitutional:  oriented to person, place, and time.  Neurological:  .  normal strength and normal reflexes. No cranial nerve deficit or sensory deficit. negative Romberg sign. GCS eye subscore is 4. GCS verbal subscore is 5. GCS motor subscore is 6.    Urgent Care Course   Clinical Course   Soft tissue neck. tx with ibuprofen.  Suggest follow up with PCP.  Procedures (including critical care time)  Labs Review Labs Reviewed - No data to display  Imaging Review No results found.   Visual Acuity Review  Right Eye Distance:   Left Eye Distance:   Bilateral Distance:    Right Eye Near:   Left Eye Near:    Bilateral Near:        Return to work note provided.  MDM   1. Episodic tension-type headache, not intractable   2. Localized soft tissue swelling     Patient is reassured that there are no issues that require transfer to higher level of care at this time or additional tests. Patient is advised to continue home symptomatic treatment. Patient is advised that if there are new or worsening symptoms to attend the emergency department, contact primary care provider, or return to UC. Instructions of care provided discharged home in stable condition.    THIS NOTE WAS GENERATED USING A VOICE RECOGNITION SOFTWARE PROGRAM. ALL REASONABLE EFFORTS  WERE MADE TO PROOFREAD THIS DOCUMENT FOR ACCURACY.  I  have verbally reviewed the discharge instructions with the patient. A printed AVS was given to the patient.  All questions were answered prior to discharge.      Tharon Aquas, PA 01/25/16 1018

## 2016-01-26 ENCOUNTER — Ambulatory Visit (INDEPENDENT_AMBULATORY_CARE_PROVIDER_SITE_OTHER): Payer: BLUE CROSS/BLUE SHIELD | Admitting: Internal Medicine

## 2016-01-26 ENCOUNTER — Encounter: Payer: Self-pay | Admitting: Internal Medicine

## 2016-01-26 VITALS — BP 126/80 | HR 94 | Temp 98.3°F | Wt 202.0 lb

## 2016-01-26 DIAGNOSIS — R221 Localized swelling, mass and lump, neck: Secondary | ICD-10-CM

## 2016-01-26 MED ORDER — FLUCONAZOLE 150 MG PO TABS: 150.0000 mg | ORAL_TABLET | ORAL | 0 refills | Status: DC

## 2016-01-26 NOTE — Patient Instructions (Signed)
We will get the ultrasound of the neck to look at that area.   We have sent in diflucan to help the skin infection. Take 1 pill today and then take 1 pill Sunday and then take 1 pill Wednesday to get rid of it. You can also use the cream if needed too.

## 2016-01-26 NOTE — Progress Notes (Signed)
   Subjective:    Patient ID: Latoya Cox, female    DOB: 08/03/1965, 50 y.o.   MRN: [id number]  HPI The patient is a 50 YO female coming in for urgent care follow up (seen for lightheadedness and dizziness as well as left neck fullness). She is not having the dizziness anymore but still is having the fullness left neck. She was not given any medication. 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 No fevers or chills. No chest pains or SOB. No weight change.   Review of Systems  Constitutional: Negative for activity change, chills, fatigue, fever and unexpected weight change.  HENT: Positive for sinus pressure. Negative for congestion, ear discharge, ear pain, postnasal drip, rhinorrhea, sore throat, trouble swallowing and voice change.   Respiratory: Negative for cough, chest tightness, shortness of breath and wheezing.   Cardiovascular: Negative for chest pain, palpitations and leg swelling.  Gastrointestinal: Negative for abdominal distention, abdominal pain, constipation, diarrhea and nausea.  Musculoskeletal: Negative.        Neck fullness  Skin: Negative.   Neurological: Negative.   Psychiatric/Behavioral: Negative.       Objective:   Physical Exam  Constitutional: She is oriented to person, place, and time. She appears well-developed and well-nourished.  Overweight  HENT:  Head: Normocephalic and atraumatic.  Eyes: EOM are normal.  Neck: Normal range of motion.  Cardiovascular: Normal rate and regular rhythm.   No murmur heard. Pulmonary/Chest: Effort normal and breath sounds normal. No respiratory distress. She has no wheezes. She has no rales.  Some fullness to the left clavicular fossae  Abdominal: Soft. Bowel sounds are normal. She exhibits no distension. There is no tenderness. There is no rebound.  Musculoskeletal: She exhibits no  edema.  Neurological: She is alert and oriented to person, place, and time. Coordination normal.  Skin: Skin is warm and dry.  Psychiatric: She has a normal mood and affect.   Vitals:   01/26/16 1555  BP: 126/80  Pulse: 94  Temp: 98.3 F (36.8 C)  TempSrc: Oral  SpO2: 98%  Weight: 202 lb (91.6 kg)      Assessment & Plan:

## 2016-01-26 NOTE — Progress Notes (Signed)
Pre visit review using our clinic review tool, if applicable. No additional management support is needed unless otherwise documented below in the visit note. 

## 2016-01-27 DIAGNOSIS — R221 Localized swelling, mass and lump, neck: Secondary | ICD-10-CM | POA: Insufficient documentation

## 2016-01-27 NOTE — Assessment & Plan Note (Signed)
Ordered US soft tissue neck and head due to the history of the vocal cord nodules.

## 2016-02-02 ENCOUNTER — Ambulatory Visit: Payer: BLUE CROSS/BLUE SHIELD | Admitting: Internal Medicine

## 2016-02-03 ENCOUNTER — Other Ambulatory Visit: Payer: BLUE CROSS/BLUE SHIELD

## 2016-02-09 ENCOUNTER — Other Ambulatory Visit: Payer: BLUE CROSS/BLUE SHIELD

## 2016-02-13 ENCOUNTER — Other Ambulatory Visit: Payer: Self-pay | Admitting: Internal Medicine

## 2016-02-16 ENCOUNTER — Ambulatory Visit
Admission: RE | Admit: 2016-02-16 | Discharge: 2016-02-16 | Disposition: A | Discharge status: Home / Self Care | Payer: BLUE CROSS/BLUE SHIELD | Source: Ambulatory Visit | Attending: Internal Medicine | Admitting: Internal Medicine

## 2016-02-16 DIAGNOSIS — R221 Localized swelling, mass and lump, neck: Secondary | ICD-10-CM

## 2016-02-18 ENCOUNTER — Ambulatory Visit: Payer: BLUE CROSS/BLUE SHIELD

## 2016-02-20 ENCOUNTER — Other Ambulatory Visit: Payer: Self-pay | Admitting: *Deleted

## 2016-02-20 MED ORDER — VITAMIN D (ERGOCALCIFEROL) 1.25 MG (50000 UNIT) PO CAPS: 50000.0000 [IU] | ORAL_CAPSULE | ORAL | 0 refills | Status: DC

## 2016-03-12 ENCOUNTER — Other Ambulatory Visit: Payer: Self-pay | Admitting: Internal Medicine

## 2016-03-20 ENCOUNTER — Ambulatory Visit: Payer: BLUE CROSS/BLUE SHIELD

## 2016-03-21 ENCOUNTER — Other Ambulatory Visit: Payer: Self-pay | Admitting: Internal Medicine

## 2016-04-09 ENCOUNTER — Other Ambulatory Visit: Payer: Self-pay | Admitting: Internal Medicine

## 2016-04-10 ENCOUNTER — Other Ambulatory Visit: Payer: Self-pay | Admitting: General Practice

## 2016-04-27 ENCOUNTER — Encounter: Payer: Self-pay | Admitting: Internal Medicine

## 2016-04-27 ENCOUNTER — Ambulatory Visit (INDEPENDENT_AMBULATORY_CARE_PROVIDER_SITE_OTHER): Payer: BLUE CROSS/BLUE SHIELD | Admitting: Internal Medicine

## 2016-04-27 DIAGNOSIS — J069 Acute upper respiratory infection, unspecified: Secondary | ICD-10-CM | POA: Insufficient documentation

## 2016-04-27 DIAGNOSIS — B9789 Other viral agents as the cause of diseases classified elsewhere: Secondary | ICD-10-CM

## 2016-04-27 MED ORDER — HYDROCODONE-HOMATROPINE 5-1.5 MG/5ML PO SYRP: 5.0000 mL | ORAL_SOLUTION | Freq: Four times a day (QID) | ORAL | 0 refills | Status: DC | PRN

## 2016-04-27 NOTE — Progress Notes (Signed)
Pre visit review using our clinic review tool, if applicable. No additional management support is needed unless otherwise documented below in the visit note. 

## 2016-04-27 NOTE — Progress Notes (Signed)
   Subjective:    Patient ID: Latoya Cox, female    DOB: 12/28/1965, 50 y.o.   MRN: [id number]  HPI The patient is a 50 YO female coming in for symptoms of upper respiratory infection. Started a few days ago (3-4). Overall worsening. No fevers or chills. No flu shot this year. Taking mucinex sinus which is helping some. Also taking flonase which is helping some. No ear pain or discharge. Mild sinus tenderness. Husband was sick as well and she thinks she got it from him.   Review of Systems  Constitutional: Positive for activity change and appetite change. Negative for chills, fatigue, fever and unexpected weight change.  HENT: Positive for congestion, rhinorrhea and sinus pressure. Negative for ear discharge, ear pain, postnasal drip, sinus pain, sore throat and trouble swallowing.   Eyes: Negative.   Respiratory: Positive for cough. Negative for chest tightness, shortness of breath and wheezing.   Cardiovascular: Negative.   Gastrointestinal: Negative.      Objective:   Physical Exam  Constitutional: She appears well-developed and well-nourished.  HENT:  Head: Normocephalic and atraumatic.  TMs normal, oropharynx with mild erythema, nose without crusting.   Eyes: EOM are normal.  Cardiovascular: Normal rate and regular rhythm.   Pulmonary/Chest: Effort normal and breath sounds normal. No respiratory distress. She has no wheezes. She has no rales.  Abdominal: Soft. She exhibits no distension. There is no tenderness. There is no rebound.  Lymphadenopathy:    She has no cervical adenopathy.  Skin: Skin is warm and dry.   Vitals:   04/27/16 1610  BP: 120/90  Pulse: (!) 103  Resp: 12  Temp: 98 F (36.7 C)  TempSrc: Oral  SpO2: 98%  Weight: 204 lb (92.5 kg)  Height: 5\' 3"  (1.6 m)      Assessment & Plan:

## 2016-04-27 NOTE — Patient Instructions (Signed)
We have given you the cough syrup today that you can take in the evening. Do not drive after taking.   It is okay to keep using over the counter cold medicine if it helps.   Keep taking the flonase and it would help to add zyrtec (also called cetirizine) you can get the generic of this to add for help.    Upper Respiratory Infection, Adult Most upper respiratory infections (URIs) are a viral infection of the air passages leading to the lungs. A URI affects the nose, throat, and upper air passages. The most common type of URI is nasopharyngitis and is typically referred to as "the common cold." URIs run their course and usually go away on their own. Most of the time, a URI does not require medical attention, but sometimes a bacterial infection in the upper airways can follow a viral infection. This is called a secondary infection. Sinus and middle ear infections are common types of secondary upper respiratory infections. Bacterial pneumonia can also complicate a URI. A URI can worsen asthma and chronic obstructive pulmonary disease (COPD). Sometimes, these complications can require emergency medical care and may be life threatening. What are the causes? Almost all URIs are caused by viruses. A virus is a type of germ and can spread from one person to another. What increases the risk? You may be at risk for a URI if:  You smoke.  You have chronic heart or lung disease.  You have a weakened defense (immune) system.  You are very young or very old.  You have nasal allergies or asthma.  You work in crowded or poorly ventilated areas.  You work in health care facilities or schools. What are the signs or symptoms? Symptoms typically develop 2-3 days after you come in contact with a cold virus. Most viral URIs last 7-10 days. However, viral URIs from the influenza virus (flu virus) can last 14-18 days and are typically more severe. Symptoms may include:  Runny or stuffy (congested)  nose.  Sneezing.  Cough.  Sore throat.  Headache.  Fatigue.  Fever.  Loss of appetite.  Pain in your forehead, behind your eyes, and over your cheekbones (sinus pain).  Muscle aches. How is this diagnosed? Your health care provider may diagnose a URI by:  Physical exam.  Tests to check that your symptoms are not due to another condition such as:  Strep throat.  Sinusitis.  Pneumonia.  Asthma. How is this treated? A URI goes away on its own with time. It cannot be cured with medicines, but medicines may be prescribed or recommended to relieve symptoms. Medicines may help:  Reduce your fever.  Reduce your cough.  Relieve nasal congestion. Follow these instructions at home:  Take medicines only as directed by your health care provider.  Gargle warm saltwater or take cough drops to comfort your throat as directed by your health care provider.  Use a warm mist humidifier or inhale steam from a shower to increase air moisture. This may make it easier to breathe.  Drink enough fluid to keep your urine clear or pale yellow.  Eat soups and other clear broths and maintain good nutrition.  Rest as needed.  Return to work when your temperature has returned to normal or as your health care provider advises. You may need to stay home longer to avoid infecting others. You can also use a face mask and careful hand washing to prevent spread of the virus.  Increase the usage of your  inhaler if you have asthma.  Do not use any tobacco products, including cigarettes, chewing tobacco, or electronic cigarettes. If you need help quitting, ask your health care provider. How is this prevented? The best way to protect yourself from getting a cold is to practice good hygiene.  Avoid oral or hand contact with people with cold symptoms.  Wash your hands often if contact occurs. There is no clear evidence that vitamin C, vitamin E, echinacea, or exercise reduces the chance of  developing a cold. However, it is always recommended to get plenty of rest, exercise, and practice good nutrition. Contact a health care provider if:  You are getting worse rather than better.  Your symptoms are not controlled by medicine.  You have chills.  You have worsening shortness of breath.  You have brown or red mucus.  You have yellow or brown nasal discharge.  You have pain in your face, especially when you bend forward.  You have a fever.  You have swollen neck glands.  You have pain while swallowing.  You have white areas in the back of your throat. Get help right away if:  You have severe or persistent:  Headache.  Ear pain.  Sinus pain.  Chest pain.  You have chronic lung disease and any of the following:  Wheezing.  Prolonged cough.  Coughing up blood.  A change in your usual mucus.  You have a stiff neck.  You have changes in your:  Vision.  Hearing.  Thinking.  Mood. This information is not intended to replace advice given to you by your health care provider. Make sure you discuss any questions you have with your health care provider. Document Released: 10/10/2000 Document Revised: 12/18/2015 Document Reviewed: 07/22/2013 Elsevier Interactive Patient Education  2017 [employer].

## 2016-04-27 NOTE — Assessment & Plan Note (Signed)
Rx for hycodan. Confirmed with her that she has taken in the past with no side effects (with 2 oral hydrocodone at once gets itching). Reassurance given and expected course discussed. Continue flonase and add zyrtec. Can use otc cold medicine if needed.

## 2016-05-07 ENCOUNTER — Other Ambulatory Visit: Payer: Self-pay | Admitting: Internal Medicine

## 2016-05-07 NOTE — Telephone Encounter (Signed)
Sent in 1 month refills, needs visit to address blood pressure and diabetes.

## 2016-06-04 ENCOUNTER — Other Ambulatory Visit: Payer: Self-pay | Admitting: Internal Medicine

## 2016-06-06 ENCOUNTER — Telehealth: Payer: Self-pay | Admitting: Internal Medicine

## 2016-06-06 NOTE — Telephone Encounter (Signed)
Patient Name: Latoya Cox DOB: 11/06/1965 Initial Comment Caller states she's having blood in her stools. Nurse Assessment Nurse: Willeen CassBennett, RN, Lelon MastSamantha Date/Time (Eastern Time): 06/06/2016 11:34:00 AM Confirm and document reason for call. If symptomatic, describe symptoms. ---Caller states she noticed today yesterday and the other day. Since Saturday. Caller states toilet water turns light red, passes blood with bms. Denies fever. Has been having chest pains and saw cardiologist. Denies cp now. Caller has been taking aspirin 1-2 every day. When she has chest pain she takes 3. Does the patient have any new or worsening symptoms? ---Yes Will a triage be completed? ---Yes Related visit to physician within the last 2 weeks? ---No Does the PT have any chronic conditions? (i.e. diabetes, asthma, etc.) ---Yes List chronic conditions. ---htn, diabetes Is the patient pregnant or possibly pregnant? (Ask all females between the ages of 6612-55) ---No Is this a behavioral health or substance abuse call? ---No Guidelines Guideline Title Affirmed Question Affirmed Notes Rectal Bleeding MODERATE rectal bleeding (small blood clots, passing blood without stool, or toilet water turns red) Rectal Bleeding Patient sounds very sick or weak to the triager Final Disposition User Go to ED Now (or PCP triage) Willeen CassBennett, RN, Samantha Comments Caller c/o feeling tired and states bp has been low at cardiologists. Caller is going to Urgent Care near her house, does not know the name of it. Referrals GO TO FACILITY OTHER - SPECIFY Disagree/Comply: Comply Disagree/Comply: Comply

## 2016-08-02 ENCOUNTER — Other Ambulatory Visit: Payer: Self-pay | Admitting: Internal Medicine

## 2016-08-02 ENCOUNTER — Other Ambulatory Visit (INDEPENDENT_AMBULATORY_CARE_PROVIDER_SITE_OTHER): Payer: BLUE CROSS/BLUE SHIELD

## 2016-08-02 ENCOUNTER — Ambulatory Visit (INDEPENDENT_AMBULATORY_CARE_PROVIDER_SITE_OTHER): Payer: BLUE CROSS/BLUE SHIELD | Admitting: Internal Medicine

## 2016-08-02 ENCOUNTER — Encounter: Payer: Self-pay | Admitting: Internal Medicine

## 2016-08-02 VITALS — BP 130/90 | HR 94 | Temp 98.2°F | Resp 12 | Ht 63.0 in | Wt 202.0 lb

## 2016-08-02 DIAGNOSIS — Z0000 Encounter for general adult medical examination without abnormal findings: Secondary | ICD-10-CM | POA: Diagnosis not present

## 2016-08-02 DIAGNOSIS — E669 Obesity, unspecified: Secondary | ICD-10-CM

## 2016-08-02 DIAGNOSIS — E1169 Type 2 diabetes mellitus with other specified complication: Secondary | ICD-10-CM

## 2016-08-02 DIAGNOSIS — I10 Essential (primary) hypertension: Secondary | ICD-10-CM | POA: Diagnosis not present

## 2016-08-02 LAB — LIPID PANEL
CHOLESTEROL: 182 mg/dL (ref 0–200)
HDL: 43.8 mg/dL (ref >39.00)
LDL Cholesterol: 110 mg/dL — ABNORMAL HIGH (ref 0–99)
NONHDL: 138.35
Total CHOL/HDL Ratio: 4
Triglycerides: 141 mg/dL (ref 0.0–149.0)
VLDL: 28.2 mg/dL (ref 0.0–40.0)

## 2016-08-02 LAB — HEMOGLOBIN A1C: HEMOGLOBIN A1C: 9.4 % — AB (ref 4.6–6.5)

## 2016-08-02 MED ORDER — NYSTATIN-TRIAMCINOLONE 100000-0.1 UNIT/GM-% EX OINT: TOPICAL_OINTMENT | CUTANEOUS | 6 refills | Status: DC

## 2016-08-02 MED ORDER — FLUCONAZOLE 150 MG PO TABS: 150.0000 mg | ORAL_TABLET | ORAL | 0 refills | Status: DC

## 2016-08-02 NOTE — Progress Notes (Signed)
Pre visit review using our clinic review tool, if applicable. No additional management support is needed unless otherwise documented below in the visit note. 

## 2016-08-02 NOTE — Progress Notes (Signed)
   Subjective:    Patient ID: Latoya Cox, female    DOB: 09-08-1965, 51 y.o.   MRN: [id number]  HPI The patient is a 51 YO female coming in for wellness. No new concerns. Recent cath at outside facility without blockages.  PMH, [facility], social history reviewed and updated.   Review of Systems  Constitutional: Negative.   HENT: Negative.   Eyes: Negative.   Respiratory: Negative for cough, chest tightness and shortness of breath.   Cardiovascular: Negative for chest pain, palpitations and leg swelling.  Gastrointestinal: Negative for abdominal distention, abdominal pain, constipation, diarrhea, nausea and vomiting.  Musculoskeletal: Positive for arthralgias. Negative for back pain, gait problem, joint swelling and myalgias.  Skin: Negative.   Neurological: Negative.   Psychiatric/Behavioral: Negative.       Objective:   Physical Exam  Constitutional: She is oriented to person, place, and time. She appears well-developed and well-nourished.  HENT:  Head: Normocephalic and atraumatic.  Eyes: EOM are normal.  Neck: Normal range of motion.  Cardiovascular: Normal rate and regular rhythm.   Pulmonary/Chest: Effort normal and breath sounds normal. No respiratory distress. She has no wheezes. She has no rales.  Abdominal: Soft. Bowel sounds are normal. She exhibits no distension. There is no tenderness. There is no rebound.  Musculoskeletal: She exhibits no edema.  Neurological: She is alert and oriented to person, place, and time. Coordination normal.  Skin: Skin is warm and dry.  Psychiatric: She has a normal mood and affect.   Vitals:   08/02/16 0828  BP: 130/90  Pulse: 94  Resp: 12  Temp: 98.2 F (36.8 C)  TempSrc: Oral  SpO2: 98%  Weight: 202 lb (91.6 kg)  Height:  (1.6 m)      Assessment & Plan:

## 2016-08-02 NOTE — Assessment & Plan Note (Signed)
Uncontrolled and prior HgA1c about 1 year ago. Checking HgA1c, foot exam done today, reminded about being overdue for eye exam. She is currently taking lantus, jardiance, tanzeum and admits diet is less than ideal, not exercising. Encouraged her to make some dietary changes prior to gastric sleeve to help with this transition as well as to start some exercise for her health. Not complicated as of yet. On ARB, pneumonia done. Not on statin and checking lipid panel for goal LDL <100.

## 2016-08-02 NOTE — Assessment & Plan Note (Signed)
Pneumonia and flu up to date. Tetanus up to date. Pap smear and mammogram up to date. Reminded about eye exam. Counseled about diet and exercise. Given screening recommendations.

## 2016-08-02 NOTE — Assessment & Plan Note (Signed)
BP at goal on coreg, amlodipine and valsartan.

## 2016-08-02 NOTE — Patient Instructions (Signed)
Don't forget to get the eyes checked soon!  Work on [employer] and start walking to help make the gastric sleeve smooth.   Health Maintenance, Female Adopting a healthy lifestyle and getting preventive care can go a long way to promote health and wellness. Talk with your health care provider about what schedule of regular examinations is right for you. This is a good chance for you to check in with your provider about disease prevention and staying healthy. In between checkups, there are plenty of things you can do on your own. Experts have done a lot of research about which lifestyle changes and preventive measures are most likely to keep you healthy. Ask your health care provider for more information. Weight and diet Eat a healthy diet  Be sure to include plenty of vegetables, fruits, low-fat dairy products, and lean protein.  Do not eat a lot of foods high in solid fats, added sugars, or salt.  Get regular exercise. This is one of the most important things you can do for your health.  Most adults should exercise for at least 150 minutes each week. The exercise should increase your heart rate and make you sweat (moderate-intensity exercise).  Most adults should also do strengthening exercises at least twice a week. This is in addition to the moderate-intensity exercise. Maintain a healthy weight  Body mass index (BMI) is a measurement that can be used to identify possible weight problems. It estimates body fat based on height and weight. Your health care provider can help determine your BMI and help you achieve or maintain a healthy weight.  For females 82 years of age and older:  A BMI below 18.5 is considered underweight.  A BMI of 18.5 to 24.9 is normal.  A BMI of 25 to 29.9 is considered overweight.  A BMI of 30 and above is considered obese. Watch levels of cholesterol and blood lipids  You should start having your blood tested for lipids and cholesterol at 49 years of age,  then have this test every 5 years.  You may need to have your cholesterol levels checked more often if:  Your lipid or cholesterol levels are high.  You are older than 49 years of age.  You are at high risk for heart disease. Cancer screening Lung Cancer  Lung cancer screening is recommended for adults 22-44 years old who are at high risk for lung cancer because of a history of smoking.  A yearly low-dose CT scan of the lungs is recommended for people who:  Currently smoke.  Have quit within the past 15 years.  Have at least a 30-pack-year history of smoking. A pack year is smoking an average of one pack of cigarettes a day for 1 year.  Yearly screening should continue until it has been 15 years since you quit.  Yearly screening should stop if you develop a health problem that would prevent you from having lung cancer treatment. Breast Cancer  Practice breast self-awareness. This means understanding how your breasts normally appear and feel.  It also means doing regular breast self-exams. Let your health care provider know about any changes, no matter how small.  If you are in your 20s or 30s, you should have a clinical breast exam (CBE) by a health care provider every 1-3 years as part of a regular health exam.  If you are 46 or older, have a CBE every year. Also consider having a breast X-ray (mammogram) every year.  If you have a  family history of breast cancer, talk to your health care provider about genetic screening.  If you are at high risk for breast cancer, talk to your health care provider about having an MRI and a mammogram every year.  Breast cancer gene (BRCA) assessment is recommended for women who have family members with BRCA-related cancers. BRCA-related cancers include:  Breast.  Ovarian.  Tubal.  Peritoneal cancers.  Results of the assessment will determine the need for genetic counseling and BRCA1 and BRCA2 testing. Cervical Cancer  Your health  care provider may recommend that you be screened regularly for cancer of the pelvic organs (ovaries, uterus, and vagina). This screening involves a pelvic examination, including checking for microscopic changes to the surface of your cervix (Pap test). You may be encouraged to have this screening done every 3 years, beginning at age 29.  For women ages 49-65, health care providers may recommend pelvic exams and Pap testing every 3 years, or they may recommend the Pap and pelvic exam, combined with testing for human papilloma virus (HPV), every 5 years. Some types of HPV increase your risk of cervical cancer. Testing for HPV may also be done on women of any age with unclear Pap test results.  Other health care providers may not recommend any screening for nonpregnant women who are considered low risk for pelvic cancer and who do not have symptoms. Ask your health care provider if a screening pelvic exam is right for you.  If you have had past treatment for cervical cancer or a condition that could lead to cancer, you need Pap tests and screening for cancer for at least 20 years after your treatment. If Pap tests have been discontinued, your risk factors (such as having a new sexual partner) need to be reassessed to determine if screening should resume. Some women have medical problems that increase the chance of getting cervical cancer. In these cases, your health care provider may recommend more frequent screening and Pap tests. Colorectal Cancer  This type of cancer can be detected and often prevented.  Routine colorectal cancer screening usually begins at 49 years of age and continues through 49 years of age.  Your health care provider may recommend screening at an earlier age if you have risk factors for colon cancer.  Your health care provider may also recommend using home test kits to check for hidden blood in the stool.  A small camera at the end of a tube can be used to examine your colon  directly (sigmoidoscopy or colonoscopy). This is done to check for the earliest forms of colorectal cancer.  Routine screening usually begins at age 75.  Direct examination of the colon should be repeated every 5-10 years through 49 years of age. However, you may need to be screened more often if early forms of precancerous polyps or small growths are found. Skin Cancer  Check your skin from head to toe regularly.  Tell your health care provider about any new moles or changes in moles, especially if there is a change in a mole's shape or color.  Also tell your health care provider if you have a mole that is larger than the size of a pencil eraser.  Always use sunscreen. Apply sunscreen liberally and repeatedly throughout the day.  Protect yourself by wearing long sleeves, pants, a wide-brimmed hat, and sunglasses whenever you are outside. Heart disease, diabetes, and high blood pressure  High blood pressure causes heart disease and increases the risk of stroke. High blood  pressure is more likely to develop in:  People who have blood pressure in the high end of the normal range (130-139/85-89 mm Hg).  People who are overweight or obese.  People who are African American.  If you are 33-37 years of age, have your blood pressure checked every 3-5 years. If you are 63 years of age or older, have your blood pressure checked every year. You should have your blood pressure measured twice-once when you are at a hospital or clinic, and once when you are not at a hospital or clinic. Record the average of the two measurements. To check your blood pressure when you are not at a hospital or clinic, you can use:  An automated blood pressure machine at a pharmacy.  A home blood pressure monitor.  If you are between 54 years and 24 years old, ask your health care provider if you should take aspirin to prevent strokes.  Have regular diabetes screenings. This involves taking a blood sample to check  your fasting blood sugar level.  If you are at a normal weight and have a low risk for diabetes, have this test once every three years after 49 years of age.  If you are overweight and have a high risk for diabetes, consider being tested at a younger age or more often. Preventing infection Hepatitis B  If you have a higher risk for hepatitis B, you should be screened for this virus. You are considered at high risk for hepatitis B if:  You were born in a country where hepatitis B is common. Ask your health care provider which countries are considered high risk.  Your parents were born in a high-risk country, and you have not been immunized against hepatitis B (hepatitis B vaccine).  You have HIV or AIDS.  You use needles to inject street drugs.  You live with someone who has hepatitis B.  You have had sex with someone who has hepatitis B.  You get hemodialysis treatment.  You take certain medicines for conditions, including cancer, organ transplantation, and autoimmune conditions. Hepatitis C  Blood testing is recommended for:  Everyone born from [birth year] through 1963.  Anyone with known risk factors for hepatitis C. Sexually transmitted infections (STIs)  You should be screened for sexually transmitted infections (STIs) including gonorrhea and chlamydia if:  You are sexually active and are younger than 49 years of age.  You are older than 49 years of age and your health care provider tells you that you are at risk for this type of infection.  Your sexual activity has changed since you were last screened and you are at an increased risk for chlamydia or gonorrhea. Ask your health care provider if you are at risk.  If you do not have HIV, but are at risk, it may be recommended that you take a prescription medicine daily to prevent HIV infection. This is called pre-exposure prophylaxis (PrEP). You are considered at risk if:  You are sexually active and do not regularly use  condoms or know the HIV status of your partner(s).  You take drugs by injection.  You are sexually active with a partner who has HIV. Talk with your health care provider about whether you are at high risk of being infected with HIV. If you choose to begin PrEP, you should first be tested for HIV. You should then be tested every 3 months for as long as you are taking PrEP. Pregnancy  If you are premenopausal and you may  become pregnant, ask your health care provider about preconception counseling.  If you may become pregnant, take 400 to 800 micrograms (mcg) of folic acid every day.  If you want to prevent pregnancy, talk to your health care provider about birth control (contraception). Osteoporosis and menopause  Osteoporosis is a disease in which the bones lose minerals and strength with aging. This can result in serious bone fractures. Your risk for osteoporosis can be identified using a bone density scan.  If you are 65 years of age or older, or if you are at risk for osteoporosis and fractures, ask your health care provider if you should be screened.  Ask your health care provider whether you should take a calcium or vitamin D supplement to lower your risk for osteoporosis.  Menopause may have certain physical symptoms and risks.  Hormone replacement therapy may reduce some of these symptoms and risks. Talk to your health care provider about whether hormone replacement therapy is right for you. Follow these instructions at home:  Schedule regular health, dental, and eye exams.  Stay current with your immunizations.  Do not use any tobacco products including cigarettes, chewing tobacco, or electronic cigarettes.  If you are pregnant, do not drink alcohol.  If you are breastfeeding, limit how much and how often you drink alcohol.  Limit alcohol intake to no more than 1 drink per day for nonpregnant women. One drink equals 12 ounces of beer, 5 ounces of wine, or 1 ounces of  hard liquor.  Do not use street drugs.  Do not share needles.  Ask your health care provider for help if you need support or information about quitting drugs.  Tell your health care provider if you often feel depressed.  Tell your health care provider if you have ever been abused or do not feel safe at home. This information is not intended to replace advice given to you by your health care provider. Make sure you discuss any questions you have with your health care provider. Document Released: 10/30/2010 Document Revised: 09/22/2015 Document Reviewed: 01/18/2015 Elsevier Interactive Patient Education  2017 [employer].

## 2016-08-03 ENCOUNTER — Telehealth: Payer: Self-pay | Admitting: Internal Medicine

## 2016-08-03 MED ORDER — FLUCONAZOLE 150 MG PO TABS: 150.0000 mg | ORAL_TABLET | ORAL | 0 refills | Status: DC

## 2016-08-03 NOTE — Telephone Encounter (Signed)
Pt called checking on Fluconazole (DIFLUCAN) 150 MG tablet prescription that was sent in yesterday. It needs to go to [facility] on [address]. Please advise.

## 2016-08-03 NOTE — Telephone Encounter (Signed)
sent 

## 2016-08-06 ENCOUNTER — Telehealth: Payer: Self-pay

## 2016-08-06 ENCOUNTER — Ambulatory Visit (INDEPENDENT_AMBULATORY_CARE_PROVIDER_SITE_OTHER): Payer: BLUE CROSS/BLUE SHIELD

## 2016-08-06 DIAGNOSIS — Z111 Encounter for screening for respiratory tuberculosis: Secondary | ICD-10-CM | POA: Diagnosis not present

## 2016-08-06 NOTE — Telephone Encounter (Signed)
She should have medication change if she is willing.

## 2016-08-06 NOTE — Telephone Encounter (Signed)
Routing to dr crawford ---lab results given to patient---patient is going to try making diet changes---do you want patient to continue on same diabetic medications with no changes for the moment?---please advise, I will call patient back, thanks

## 2016-08-07 NOTE — Telephone Encounter (Signed)
Left message advising that dr Okey Dupre can make medication changes if patient would like to do that---patient should call back to request med changes and we can get dr crawfords advisement about meds if patient would like to try that---can talk with Elizebeth Kluesner if any questions

## 2016-08-08 LAB — TB SKIN TEST
Induration: 0 mm
TB Skin Test: NEGATIVE

## 2016-08-29 ENCOUNTER — Other Ambulatory Visit: Payer: Self-pay | Admitting: Internal Medicine

## 2016-09-10 ENCOUNTER — Encounter: Payer: Self-pay | Admitting: Internal Medicine

## 2016-09-10 ENCOUNTER — Ambulatory Visit (INDEPENDENT_AMBULATORY_CARE_PROVIDER_SITE_OTHER): Payer: BLUE CROSS/BLUE SHIELD | Admitting: Internal Medicine

## 2016-09-10 ENCOUNTER — Ambulatory Visit (INDEPENDENT_AMBULATORY_CARE_PROVIDER_SITE_OTHER)
Admission: RE | Admit: 2016-09-10 | Discharge: 2016-09-10 | Disposition: A | Discharge status: Home / Self Care | Payer: BLUE CROSS/BLUE SHIELD | Source: Ambulatory Visit | Attending: Internal Medicine | Admitting: Internal Medicine

## 2016-09-10 VITALS — BP 140/72 | HR 94 | Temp 97.6°F | Resp 14 | Ht 63.0 in | Wt 204.0 lb

## 2016-09-10 DIAGNOSIS — M25561 Pain in right knee: Secondary | ICD-10-CM

## 2016-09-10 MED ORDER — DICLOFENAC SODIUM 75 MG PO TBEC: 75.0000 mg | DELAYED_RELEASE_TABLET | Freq: Two times a day (BID) | ORAL | 0 refills | Status: DC

## 2016-09-10 NOTE — Patient Instructions (Addendum)
We will get the x-ray of the knee today and call you back with the results.   We have filled out the paper for you today.   We have sent in voltaren pills to take 1 pill twice a day as needed for pain.

## 2016-09-11 DIAGNOSIS — M25561 Pain in right knee: Secondary | ICD-10-CM | POA: Insufficient documentation

## 2016-09-11 NOTE — Assessment & Plan Note (Addendum)
Checking x-ray for progression of arthritis since last done about 8 years ago. Rx for voltaren PO for the pain. Likely strain from shoes.

## 2016-09-11 NOTE — Progress Notes (Signed)
   Subjective:    Patient ID: Latoya Cox, female    DOB: 06/05/1965, 51 y.o.   MRN: [id number]  HPI The patient is a 51 YO female coming in for right knee pain for about 1 day. Started Sunday afternoon after wearing new shoes to church. She had to take them off right after due to uncomfort. She denies injury or overuse. She has had some problems with this knee in the past. She has tried ibuprofen for the pain which helped a little initially but today has not helped. She denies instability or collapse of the knee while walking. No swelling of the knee. No rash.   Review of Systems  Constitutional: Positive for activity change. Negative for appetite change, chills, fatigue, fever and unexpected weight change.  Respiratory: Negative.   Cardiovascular: Negative.   Gastrointestinal: Negative.   Musculoskeletal: Positive for arthralgias and myalgias. Negative for back pain, gait problem, joint swelling, neck pain and neck stiffness.  Skin: Negative.   Neurological: Negative.       Objective:   Physical Exam  Constitutional: She is oriented to person, place, and time. She appears well-developed and well-nourished.  HENT:  Head: Normocephalic and atraumatic.  Eyes: EOM are normal.  Cardiovascular: Normal rate and regular rhythm.   Pulmonary/Chest: Effort normal.  Abdominal: Soft.  Musculoskeletal: She exhibits tenderness.  Some medial tenderness right knee, ACL/PCL intact, no joint effusion noted on exam, no pain on standing.   Neurological: She is alert and oriented to person, place, and time. Coordination normal.  Skin: Skin is warm and dry.   Vitals:   09/10/16 1610  BP: 140/72  Pulse: 94  Resp: 14  Temp: 97.6 F (36.4 C)  TempSrc: Oral  SpO2: 98%  Weight: 204 lb (92.5 kg)  Height: 5\' 3"  (1.6 m)      Assessment & Plan:

## 2016-10-02 ENCOUNTER — Other Ambulatory Visit: Payer: Self-pay | Admitting: Internal Medicine

## 2016-10-22 ENCOUNTER — Other Ambulatory Visit: Payer: Self-pay | Admitting: Internal Medicine

## 2016-10-31 ENCOUNTER — Other Ambulatory Visit: Payer: Self-pay | Admitting: Internal Medicine

## 2016-12-27 ENCOUNTER — Other Ambulatory Visit: Payer: Self-pay | Admitting: Family

## 2017-01-10 ENCOUNTER — Ambulatory Visit: Payer: BLUE CROSS/BLUE SHIELD | Admitting: Internal Medicine

## 2017-01-10 ENCOUNTER — Ambulatory Visit (INDEPENDENT_AMBULATORY_CARE_PROVIDER_SITE_OTHER): Payer: BLUE CROSS/BLUE SHIELD | Admitting: Internal Medicine

## 2017-01-10 ENCOUNTER — Other Ambulatory Visit (INDEPENDENT_AMBULATORY_CARE_PROVIDER_SITE_OTHER): Payer: BLUE CROSS/BLUE SHIELD

## 2017-01-10 ENCOUNTER — Encounter: Payer: Self-pay | Admitting: Internal Medicine

## 2017-01-10 ENCOUNTER — Telehealth: Payer: Self-pay

## 2017-01-10 VITALS — BP 150/92 | HR 74 | Temp 97.8°F | Ht 63.0 in | Wt 169.0 lb

## 2017-01-10 DIAGNOSIS — E669 Obesity, unspecified: Secondary | ICD-10-CM | POA: Diagnosis not present

## 2017-01-10 DIAGNOSIS — M25561 Pain in right knee: Secondary | ICD-10-CM

## 2017-01-10 DIAGNOSIS — E1169 Type 2 diabetes mellitus with other specified complication: Secondary | ICD-10-CM

## 2017-01-10 DIAGNOSIS — I10 Essential (primary) hypertension: Secondary | ICD-10-CM | POA: Diagnosis not present

## 2017-01-10 DIAGNOSIS — Z23 Encounter for immunization: Secondary | ICD-10-CM | POA: Diagnosis not present

## 2017-01-10 LAB — LIPID PANEL
CHOL/HDL RATIO: 3
CHOLESTEROL: 183 mg/dL (ref 0–200)
HDL: 52.8 mg/dL (ref >39.00)
LDL CALC: 104 mg/dL — AB (ref 0–99)
NONHDL: 130.1
TRIGLYCERIDES: 133 mg/dL (ref 0.0–149.0)
VLDL: 26.6 mg/dL (ref 0.0–40.0)

## 2017-01-10 LAB — CBC
HCT: 42.3 % (ref 36.0–46.0)
HEMOGLOBIN: 13.8 g/dL (ref 12.0–15.0)
MCHC: 32.7 g/dL (ref 30.0–36.0)
MCV: 83.7 fl (ref 78.0–100.0)
PLATELETS: 196 10*3/uL (ref 150.0–400.0)
RBC: 5.05 Mil/uL (ref 3.87–5.11)
RDW: 14 % (ref 11.5–15.5)
WBC: 7.1 10*3/uL (ref 4.0–10.5)

## 2017-01-10 LAB — HEMOGLOBIN A1C: Hgb A1c MFr Bld: 8.4 % — ABNORMAL HIGH (ref 4.6–6.5)

## 2017-01-10 LAB — COMPREHENSIVE METABOLIC PANEL
ALT: 24 U/L (ref 0–35)
AST: 26 U/L (ref 0–37)
Albumin: 4.1 g/dL (ref 3.5–5.2)
Alkaline Phosphatase: 82 U/L (ref 39–117)
BILIRUBIN TOTAL: 1.3 mg/dL — AB (ref 0.2–1.2)
BUN: 13 mg/dL (ref 6–23)
CALCIUM: 9.2 mg/dL (ref 8.4–10.5)
CHLORIDE: 100 meq/L (ref 96–112)
CO2: 32 meq/L (ref 19–32)
Creatinine, Ser: 0.83 mg/dL (ref 0.40–1.20)
GFR: 93.73 mL/min (ref >60.00)
Glucose, Bld: 185 mg/dL — ABNORMAL HIGH (ref 70–99)
POTASSIUM: 2.5 meq/L — AB (ref 3.5–5.1)
Sodium: 142 mEq/L (ref 135–145)
Total Protein: 6.9 g/dL (ref 6.0–8.3)

## 2017-01-10 MED ORDER — EMPAGLIFLOZIN 25 MG PO TABS: 25.0000 mg | ORAL_TABLET | Freq: Every day | ORAL | 11 refills | Status: DC

## 2017-01-10 NOTE — Assessment & Plan Note (Signed)
Still using diclofenac for pain and not much improvement with weight loss.

## 2017-01-10 NOTE — Assessment & Plan Note (Signed)
BP is high and has been up at other visits, she is taking coreg BID and has taken already today. Will likely add back jardiance which can help lower BP some while she is still losing weight as well.

## 2017-01-10 NOTE — Telephone Encounter (Signed)
Advised patient of dr crawfords note/instruction, patient repeated back for understanding

## 2017-01-10 NOTE — Telephone Encounter (Signed)
Patient has not been taking potassium and needs to resume. 2 pills today, tomorrow and Saturday. Then resume normal dosing.

## 2017-01-10 NOTE — Progress Notes (Signed)
   Subjective:    Patient ID: Latoya Cox, female    DOB: 08/04/1965, 51 y.o.   MRN: [id number]  HPI The patient is a 51 YO female coming in for follow up of her medical problems including her blood pressure (she has stopped exforge after weight loss surgery and was switched to coreg BID, some high BP at home recently, down about 30-40 pounds since surgery and expecting to lose about 50 pounds total, denies headaches or feeling sick) and her diabetes (was taking lantus, jardiance and tanzeum, not taking anything now, having some sugars in the 200s which is better for her, last known HgA1c >9, denies hypoglycemia, diet is okay some potatoes and liquid diet for some time) and also her knee pain (still taking diclofenac, some improvement with weight loss but still with pain, no worsening, no new falls or injury).   Review of Systems  Constitutional: Negative.   HENT: Negative.   Eyes: Negative.   Respiratory: Negative for cough, chest tightness and shortness of breath.   Cardiovascular: Negative for chest pain, palpitations and leg swelling.  Gastrointestinal: Negative for abdominal distention, abdominal pain, constipation, diarrhea, nausea and vomiting.  Musculoskeletal: Negative.   Skin: Negative.   Neurological: Negative.   Psychiatric/Behavioral: Negative.       Objective:   Physical Exam  Constitutional: She is oriented to person, place, and time. She appears well-developed and well-nourished.  HENT:  Head: Normocephalic and atraumatic.  Eyes: EOM are normal.  Neck: Normal range of motion.  Cardiovascular: Normal rate and regular rhythm.   Pulmonary/Chest: Effort normal and breath sounds normal. No respiratory distress. She has no wheezes. She has no rales.  Abdominal: Soft. Bowel sounds are normal. She exhibits no distension. There is no tenderness. There is no rebound.  Musculoskeletal: She exhibits no edema.  Neurological: She is alert and oriented to person, place, and time.  Coordination normal.  Skin: Skin is warm and dry.  Psychiatric: She has a normal mood and affect.   Vitals:   01/10/17 0816  BP: (!) 150/92  Pulse: 74  Temp: 97.8 F (36.6 C)  TempSrc: Oral  SpO2: 100%  Weight: 169 lb (76.7 kg)  Height: 5\' 3"  (1.6 m)      Assessment & Plan:  Flu shot given at visit

## 2017-01-10 NOTE — Telephone Encounter (Signed)
CRITICAL VALUE STICKER  CRITICAL VALUE:potassium 2.5  RECEIVER (on-site recipient of call):tamara/RN  DATE & TIME NOTIFIED: 9/13 10:44am  MESSENGER (representative from lab):hope/lab  MD NOTIFIED: crawford  TIME OF NOTIFICATION:9/13 10:45  RESPONSE: pending

## 2017-01-10 NOTE — Patient Instructions (Signed)
We will check the labs today and call you back with the results.    

## 2017-01-10 NOTE — Assessment & Plan Note (Signed)
Checking HgA1c and likely restart some of her meds for goal 7.5 HgA1c, cholesterol meds as needed for goal LDL <100. Not complicated at this time.

## 2017-01-28 ENCOUNTER — Other Ambulatory Visit: Payer: Self-pay | Admitting: Internal Medicine

## 2017-02-01 ENCOUNTER — Telehealth: Payer: Self-pay | Admitting: Emergency Medicine

## 2017-02-01 NOTE — Telephone Encounter (Signed)
Pharmacy called and stated she needs some clarification on patients nystatin-triamcinolone ointment (MYCOLOG). Please advise thanks.

## 2017-02-01 NOTE — Telephone Encounter (Signed)
Tried calling pharmacy and it just kept me on hold will call back monday

## 2017-02-06 NOTE — Telephone Encounter (Signed)
I have tried to call pharmacy back multiple times and have been on wait for five plus minutes each time

## 2017-02-06 NOTE — Telephone Encounter (Signed)
Pharmacy called back and informed that it is a 30 day supply 11 refills

## 2017-02-11 ENCOUNTER — Other Ambulatory Visit: Payer: Self-pay | Admitting: Family

## 2017-03-01 ENCOUNTER — Other Ambulatory Visit: Payer: Self-pay | Admitting: Internal Medicine

## 2017-03-04 MED ORDER — FLUTICASONE PROPIONATE 50 MCG/ACT NA SUSP: 1.0000 | Freq: Every day | NASAL | 11 refills | Status: AC | PRN

## 2017-03-04 MED ORDER — OMEPRAZOLE 40 MG PO CPDR: 40.0000 mg | DELAYED_RELEASE_CAPSULE | Freq: Every day | ORAL | 1 refills | Status: DC

## 2017-03-26 ENCOUNTER — Other Ambulatory Visit: Payer: Self-pay | Admitting: Internal Medicine

## 2017-04-27 ENCOUNTER — Other Ambulatory Visit: Payer: Self-pay | Admitting: Internal Medicine

## 2017-05-10 ENCOUNTER — Ambulatory Visit: Payer: BLUE CROSS/BLUE SHIELD | Admitting: Internal Medicine

## 2017-05-14 ENCOUNTER — Encounter: Payer: Self-pay | Admitting: Internal Medicine

## 2017-05-14 ENCOUNTER — Other Ambulatory Visit (INDEPENDENT_AMBULATORY_CARE_PROVIDER_SITE_OTHER): Payer: BLUE CROSS/BLUE SHIELD

## 2017-05-14 ENCOUNTER — Ambulatory Visit (INDEPENDENT_AMBULATORY_CARE_PROVIDER_SITE_OTHER): Payer: BLUE CROSS/BLUE SHIELD | Admitting: Internal Medicine

## 2017-05-14 VITALS — BP 130/98 | HR 84 | Temp 98.0°F | Ht 63.0 in | Wt 168.0 lb

## 2017-05-14 DIAGNOSIS — E1169 Type 2 diabetes mellitus with other specified complication: Secondary | ICD-10-CM

## 2017-05-14 DIAGNOSIS — I10 Essential (primary) hypertension: Secondary | ICD-10-CM | POA: Diagnosis not present

## 2017-05-14 DIAGNOSIS — E663 Overweight: Secondary | ICD-10-CM | POA: Diagnosis not present

## 2017-05-14 DIAGNOSIS — E669 Obesity, unspecified: Secondary | ICD-10-CM | POA: Diagnosis not present

## 2017-05-14 LAB — COMPREHENSIVE METABOLIC PANEL
ALT: 13 U/L (ref 0–35)
AST: 14 U/L (ref 0–37)
Albumin: 3.9 g/dL (ref 3.5–5.2)
Alkaline Phosphatase: 78 U/L (ref 39–117)
BILIRUBIN TOTAL: 1 mg/dL (ref 0.2–1.2)
BUN: 15 mg/dL (ref 6–23)
CALCIUM: 8.5 mg/dL (ref 8.4–10.5)
CO2: 30 meq/L (ref 19–32)
CREATININE: 0.75 mg/dL (ref 0.40–1.20)
Chloride: 103 mEq/L (ref 96–112)
GFR: 105.22 mL/min (ref >60.00)
GLUCOSE: 189 mg/dL — AB (ref 70–99)
Potassium: 2.9 mEq/L — ABNORMAL LOW (ref 3.5–5.1)
Sodium: 141 mEq/L (ref 135–145)
Total Protein: 6.3 g/dL (ref 6.0–8.3)

## 2017-05-14 LAB — HEMOGLOBIN A1C: Hgb A1c MFr Bld: 7.6 % — ABNORMAL HIGH (ref 4.6–6.5)

## 2017-05-14 NOTE — Assessment & Plan Note (Signed)
Weight down dramatically after her weight loss procedure. She has plateaued at this visit due to the holidays. Referral to nutrition today to help her re-motivate.

## 2017-05-14 NOTE — Patient Instructions (Signed)
We will get you in with the nutritionist to talk about the foods.    Diabetes Mellitus and Standards of Medical Care Managing diabetes (diabetes mellitus) can be complicated. Your diabetes treatment may be managed by a team of health care providers, including:  A diet and nutrition specialist (registered dietitian).  A nurse.  A certified diabetes educator (CDE).  A diabetes specialist (endocrinologist).  An eye doctor.  A primary care provider.  A dentist.  Your health care providers follow a schedule in order to help you get the best quality of care. The following schedule is a general guideline for your diabetes management plan. Your health care providers may also give you more specific instructions. HbA1c ( hemoglobin A1c) test This test provides information about blood sugar (glucose) control over the previous 2-3 months. It is used to check whether your diabetes management plan needs to be adjusted.  If you are meeting your treatment goals, this test is done at least 2 times a year.  If you are not meeting treatment goals or if your treatment goals have changed, this test is done 4 times a year.  Blood pressure test  This test is done at every routine medical visit. For most people, the goal is less than 130/80. Ask your health care provider what your goal blood pressure should be. Dental and eye exams  Visit your dentist two times a year.  If you have type 1 diabetes, get an eye exam 3-5 years after you are diagnosed, and then once a year after your first exam. ? If you were diagnosed with type 1 diabetes as a child, get an eye exam when you are age 45 or older and have had diabetes for 3-5 years. After the first exam, you should get an eye exam once a year.  If you have type 2 diabetes, have an eye exam as soon as you are diagnosed, and then once a year after your first exam. Foot care exam  Visual foot exams are done at every routine medical visit. The exams check  for cuts, bruises, redness, blisters, sores, or other problems with the feet.  A complete foot exam is done by your health care provider once a year. This exam includes an inspection of the structure and skin of your feet, and a check of the pulses and sensation in your feet. ? Type 1 diabetes: Get your first exam 3-5 years after diagnosis. ? Type 2 diabetes: Get your first exam as soon as you are diagnosed.  Check your feet every day for cuts, bruises, redness, blisters, or sores. If you have any of these or other problems that are not healing, contact your health care provider. Kidney function test ( urine microalbumin)  This test is done once a year. ? Type 1 diabetes: Get your first test 5 years after diagnosis. ? Type 2 diabetes: Get your first test as soon as you are diagnosed.  If you have chronic kidney disease (CKD), get a serum creatinine and estimated glomerular filtration rate (eGFR) test once a year. Lipid profile (cholesterol, HDL, LDL, triglycerides)  This test should be done when you are diagnosed with diabetes, and every 5 years after the first test. If you are on medicines to lower your cholesterol, you may need to get this test done every year. ? The goal for LDL is less than 100 mg/dL (5.5 mmol/L). If you are at high risk, the goal is less than 70 mg/dL (3.9 mmol/L). ? The goal for  HDL is 40 mg/dL (2.2 mmol/L) for men and 50 mg/dL(2.8 mmol/L) for women. An HDL cholesterol of 60 mg/dL (3.3 mmol/L) or higher gives some protection against heart disease. ? The goal for triglycerides is less than 150 mg/dL (8.3 mmol/L). Immunizations  The yearly flu (influenza) vaccine is recommended for everyone 6 months or older who has diabetes.  The pneumonia (pneumococcal) vaccine is recommended for everyone 2 years or older who has diabetes. If you are 91 or older, you may get the pneumonia vaccine as a series of two separate shots.  The hepatitis B vaccine is recommended for adults  shortly after they have been diagnosed with diabetes.  The Tdap (tetanus, diphtheria, and pertussis) vaccine should be given: ? According to normal childhood vaccination schedules, for children. ? Every 10 years, for adults who have diabetes.  The shingles vaccine is recommended for people who have had chicken pox and are 50 years or older. Mental and emotional health  Screening for symptoms of eating disorders, anxiety, and depression is recommended at the time of diagnosis and afterward as needed. If your screening shows that you have symptoms (you have a positive screening result), you may need further evaluation and be referred to a mental health care provider. Diabetes self-management education  Education about how to manage your diabetes is recommended at diagnosis and ongoing as needed. Treatment plan  Your treatment plan will be reviewed at every medical visit. Summary  Managing diabetes (diabetes mellitus) can be complicated. Your diabetes treatment may be managed by a team of health care providers.  Your health care providers follow a schedule in order to help you get the best quality of care.  Standards of care including having regular physical exams, blood tests, blood pressure monitoring, immunizations, screening tests, and education about how to manage your diabetes.  Your health care providers may also give you more specific instructions based on your individual health. This information is not intended to replace advice given to you by your health care provider. Make sure you discuss any questions you have with your health care provider. Document Released: 02/11/2009 Document Revised: 01/13/2016 Document Reviewed: 01/13/2016 Elsevier Interactive Patient Education  [employer].

## 2017-05-14 NOTE — Progress Notes (Signed)
   Subjective:    Patient ID: Latoya Cox, female    DOB: 01/05/1966, 52 y.o.   MRN: [id number]  HPI The patient is a 52 YO female coming in for follow up of her weight (still working on weight loss after procedure, she is having a problem with night time snacking, goal is still to lose 15 more pounds) and her blood pressure (taking coreg BID, added jardiance for blood sugar control last visit to help BP as well, checks BP at home and normal) and her blood sugars (previously uncontrolled and she has been taking jardiance daily, denies numbness or tingling pains in her feet or hands, no low sugars).   Review of Systems  Constitutional: Negative.   HENT: Negative.   Eyes: Negative.   Respiratory: Negative for cough, chest tightness and shortness of breath.   Cardiovascular: Negative for chest pain, palpitations and leg swelling.  Gastrointestinal: Negative for abdominal distention, abdominal pain, constipation, diarrhea, nausea and vomiting.  Musculoskeletal: Negative.   Skin: Negative.   Neurological: Negative.   Psychiatric/Behavioral: Negative.       Objective:   Physical Exam  Constitutional: She is oriented to person, place, and time. She appears well-developed and well-nourished.  HENT:  Head: Normocephalic and atraumatic.  Eyes: EOM are normal.  Neck: Normal range of motion.  Cardiovascular: Normal rate and regular rhythm.  Pulmonary/Chest: Effort normal and breath sounds normal. No respiratory distress. She has no wheezes. She has no rales.  Abdominal: Soft. Bowel sounds are normal. She exhibits no distension. There is no tenderness. There is no rebound.  Musculoskeletal: She exhibits no edema.  Neurological: She is alert and oriented to person, place, and time. Coordination normal.  Skin: Skin is warm and dry.  Psychiatric: She has a normal mood and affect.   Vitals:   05/14/17 1304 05/14/17 1325  BP: (!) 160/100 (!) 130/98  Pulse: 84   Temp: 98 F (36.7 C)   TempSrc:  Oral   SpO2: 100%   Weight: 168 lb (76.2 kg)   Height: 5\' 3"  (1.6 m)       Assessment & Plan:

## 2017-05-14 NOTE — Assessment & Plan Note (Signed)
Checking HgA1c as previously uncontrolled, on jardiance. Adjust as needed. She is not currently on ACE-I or statin.

## 2017-05-14 NOTE — Assessment & Plan Note (Addendum)
Taking coreg 12.5 mg bid and checking CMP. May need to add ACE-I if BP still uncontrolled, she declines today.

## 2017-05-17 ENCOUNTER — Other Ambulatory Visit: Payer: Self-pay | Admitting: Internal Medicine

## 2017-05-17 MED ORDER — POTASSIUM CHLORIDE CRYS ER 20 MEQ PO TBCR: 40.0000 meq | EXTENDED_RELEASE_TABLET | Freq: Every day | ORAL | 11 refills | Status: DC

## 2017-06-24 ENCOUNTER — Other Ambulatory Visit: Payer: Self-pay | Admitting: Internal Medicine

## 2017-07-21 ENCOUNTER — Emergency Department (HOSPITAL_COMMUNITY): Payer: BLUE CROSS/BLUE SHIELD

## 2017-07-21 ENCOUNTER — Emergency Department (HOSPITAL_COMMUNITY)
Admission: EM | Admit: 2017-07-21 | Discharge: 2017-07-21 | Disposition: A | Discharge status: Home / Self Care | Payer: BLUE CROSS/BLUE SHIELD | Attending: Emergency Medicine | Admitting: Emergency Medicine

## 2017-07-21 ENCOUNTER — Encounter (HOSPITAL_COMMUNITY): Payer: Self-pay

## 2017-07-21 DIAGNOSIS — R1030 Lower abdominal pain, unspecified: Secondary | ICD-10-CM

## 2017-07-21 DIAGNOSIS — Z7982 Long term (current) use of aspirin: Secondary | ICD-10-CM | POA: Insufficient documentation

## 2017-07-21 DIAGNOSIS — E876 Hypokalemia: Secondary | ICD-10-CM

## 2017-07-21 DIAGNOSIS — E119 Type 2 diabetes mellitus without complications: Secondary | ICD-10-CM | POA: Insufficient documentation

## 2017-07-21 DIAGNOSIS — R112 Nausea with vomiting, unspecified: Secondary | ICD-10-CM

## 2017-07-21 DIAGNOSIS — Z79899 Other long term (current) drug therapy: Secondary | ICD-10-CM | POA: Insufficient documentation

## 2017-07-21 DIAGNOSIS — Z7984 Long term (current) use of oral hypoglycemic drugs: Secondary | ICD-10-CM | POA: Insufficient documentation

## 2017-07-21 DIAGNOSIS — I10 Essential (primary) hypertension: Secondary | ICD-10-CM | POA: Insufficient documentation

## 2017-07-21 DIAGNOSIS — R9431 Abnormal electrocardiogram [ECG] [EKG]: Secondary | ICD-10-CM

## 2017-07-21 LAB — COMPREHENSIVE METABOLIC PANEL
ALBUMIN: 4.5 g/dL (ref 3.5–5.0)
ALT: 20 U/L (ref 14–54)
ANION GAP: 13 (ref 5–15)
AST: 22 U/L (ref 15–41)
Alkaline Phosphatase: 89 U/L (ref 38–126)
BILIRUBIN TOTAL: 1.7 mg/dL — AB (ref 0.3–1.2)
BUN: 23 mg/dL — ABNORMAL HIGH (ref 6–20)
CO2: 26 mmol/L (ref 22–32)
Calcium: 9.4 mg/dL (ref 8.9–10.3)
Chloride: 105 mmol/L (ref 101–111)
Creatinine, Ser: 0.72 mg/dL (ref 0.44–1.00)
GFR calc non Af Amer: >60 mL/min (ref >60)
GLUCOSE: 226 mg/dL — AB (ref 65–99)
POTASSIUM: 3.3 mmol/L — AB (ref 3.5–5.1)
SODIUM: 144 mmol/L (ref 135–145)
Total Protein: 7.9 g/dL (ref 6.5–8.1)

## 2017-07-21 LAB — CBC WITH DIFFERENTIAL/PLATELET
Basophils Absolute: 0 10*3/uL (ref 0.0–0.1)
Basophils Relative: 0 %
EOS ABS: 0.1 10*3/uL (ref 0.0–0.7)
Eosinophils Relative: 1 %
HEMATOCRIT: 48.7 % — AB (ref 36.0–46.0)
Hemoglobin: 16 g/dL — ABNORMAL HIGH (ref 12.0–15.0)
Lymphocytes Relative: 5 %
Lymphs Abs: 0.8 10*3/uL (ref 0.7–4.0)
MCH: 28.5 pg (ref 26.0–34.0)
MCHC: 32.9 g/dL (ref 30.0–36.0)
MCV: 86.8 fL (ref 78.0–100.0)
MONO ABS: 0.6 10*3/uL (ref 0.1–1.0)
MONOS PCT: 4 %
Neutro Abs: 13.4 10*3/uL — ABNORMAL HIGH (ref 1.7–7.7)
Neutrophils Relative %: 90 %
Platelets: 194 10*3/uL (ref 150–400)
RBC: 5.61 MIL/uL — ABNORMAL HIGH (ref 3.87–5.11)
RDW: 13.4 % (ref 11.5–15.5)
WBC: 14.9 10*3/uL — ABNORMAL HIGH (ref 4.0–10.5)

## 2017-07-21 LAB — URINALYSIS, ROUTINE W REFLEX MICROSCOPIC
BILIRUBIN URINE: NEGATIVE
Glucose, UA: >=500 mg/dL — AB
HGB URINE DIPSTICK: NEGATIVE
KETONES UR: 80 mg/dL — AB
LEUKOCYTES UA: NEGATIVE
NITRITE: NEGATIVE
PROTEIN: 30 mg/dL — AB
Specific Gravity, Urine: 1.032 — ABNORMAL HIGH (ref 1.005–1.030)
pH: 5 (ref 5.0–8.0)

## 2017-07-21 LAB — I-STAT TROPONIN, ED: Troponin i, poc: 0 ng/mL (ref 0.00–0.08)

## 2017-07-21 LAB — TROPONIN I: Troponin I: <0.03 ng/mL (ref <0.03)

## 2017-07-21 LAB — CBG MONITORING, ED: Glucose-Capillary: 207 mg/dL — ABNORMAL HIGH (ref 65–99)

## 2017-07-21 LAB — PREGNANCY, URINE: Preg Test, Ur: NEGATIVE

## 2017-07-21 MED ORDER — IOPAMIDOL (ISOVUE-300) INJECTION 61%
INTRAVENOUS | Status: AC
  Filled 2017-07-21: qty 100

## 2017-07-21 MED ORDER — SODIUM CHLORIDE 0.9 % IJ SOLN
INTRAMUSCULAR | Status: AC
  Filled 2017-07-21: qty 50

## 2017-07-21 MED ORDER — METOCLOPRAMIDE HCL 5 MG/ML IJ SOLN
10.0000 mg | Freq: Once | INTRAMUSCULAR | Status: AC
  Administered 2017-07-21: 10 mg via INTRAVENOUS
  Filled 2017-07-21: qty 2

## 2017-07-21 MED ORDER — DIPHENHYDRAMINE HCL 50 MG/ML IJ SOLN
25.0000 mg | Freq: Once | INTRAMUSCULAR | Status: AC
  Administered 2017-07-21: 25 mg via INTRAVENOUS
  Filled 2017-07-21: qty 1

## 2017-07-21 MED ORDER — SODIUM CHLORIDE 0.9 % IV BOLUS (SEPSIS)
1000.0000 mL | Freq: Once | INTRAVENOUS | Status: AC
  Administered 2017-07-21: 1000 mL via INTRAVENOUS

## 2017-07-21 MED ORDER — IOPAMIDOL (ISOVUE-300) INJECTION 61%
100.0000 mL | Freq: Once | INTRAVENOUS | Status: AC | PRN
  Administered 2017-07-21: 100 mL via INTRAVENOUS

## 2017-07-21 MED ORDER — MORPHINE SULFATE (PF) 4 MG/ML IV SOLN
4.0000 mg | Freq: Once | INTRAVENOUS | Status: AC
Start: 2017-07-21 — End: 2017-07-21
  Administered 2017-07-21: 4 mg via INTRAVENOUS
  Filled 2017-07-21: qty 1

## 2017-07-21 MED ORDER — POTASSIUM CHLORIDE CRYS ER 20 MEQ PO TBCR
40.0000 meq | EXTENDED_RELEASE_TABLET | Freq: Once | ORAL | Status: AC
  Administered 2017-07-21: 40 meq via ORAL
  Filled 2017-07-21: qty 2

## 2017-07-21 MED ORDER — ONDANSETRON 4 MG PO TBDP
4.0000 mg | ORAL_TABLET | Freq: Once | ORAL | Status: AC | PRN
  Administered 2017-07-21: 4 mg via ORAL
  Filled 2017-07-21: qty 1

## 2017-07-21 MED ORDER — METOCLOPRAMIDE HCL 10 MG PO TABS: 10.0000 mg | ORAL_TABLET | Freq: Four times a day (QID) | ORAL | 0 refills | Status: DC

## 2017-07-21 NOTE — ED Triage Notes (Signed)
Pt complains of N,V and abd pain that woke her up from her sleep CBG 217 Pt had lapband surgery last year

## 2017-07-21 NOTE — Discharge Instructions (Addendum)
Please read and follow all provided instructions.  Your diagnoses today include:  1. Non-intractable vomiting with nausea, unspecified vomiting type   2. Abnormal EKG   3. Lower abdominal pain   4. Hypokalemia     Tests performed today include: Blood counts and electrolytes Blood tests to check liver and kidney function Blood tests to check pancreas function Urine test to look for infection and pregnancy (in women) Vital signs. See below for your results today.   Medications prescribed:   Take any prescribed medications only as directed.  Your prescribed Reglan for your nausea.  You may take this every 6 hours as needed.  Please combine this with 25 mg of Benadryl, to avoid any anxious side effects of Reglan.  Home care instructions:  Follow any educational materials contained in this packet.  Your abdominal pain, nausea, vomiting, and diarrhea may be caused by a viral gastroenteritis also called 'stomach flu'. You should rest for the next several days. Keep drinking plenty of fluids and use the medicine for nausea as directed.   Drink clear liquids for the next 24 hours and introduce solid foods slowly after 24 hours using the b.r.a.t. diet (Bananas, Rice, Applesauce, Toast, Yogurt).    Follow-up instructions: Please follow-up with your primary care provider in the next 2 days for further evaluation of your symptoms. If you are not feeling better in 48 hours you may have a condition that is more serious and you need re-evaluation.   Return instructions:  SEEK IMMEDIATE MEDICAL ATTENTION IF: If you have pain that does not go away or becomes severe  A temperature above 101F develops  Repeated vomiting occurs (multiple episodes)  If you have pain that becomes localized to portions of the abdomen. The right side could possibly be appendicitis. In an adult, the left lower portion of the abdomen could be colitis or diverticulitis.  Blood is being passed in stools or vomit (bright red  or black tarry stools)  You develop chest pain, difficulty breathing, dizziness or fainting, or become confused, poorly responsive, or inconsolable (young children) If you have any other emergent concerns regarding your health  Additional Information: Abdominal (belly) pain can be caused by many things. Your caregiver performed an examination and possibly ordered blood/urine tests and imaging (CT scan, x-rays, ultrasound). Many cases can be observed and treated at home after initial evaluation in the emergency department. Even though you are being discharged home, abdominal pain can be unpredictable. Therefore, you need a repeated exam if your pain does not resolve, returns, or worsens. Most patients with abdominal pain don't have to be admitted to the hospital or have surgery, but serious problems like appendicitis and gallbladder attacks can start out as nonspecific pain. Many abdominal conditions cannot be diagnosed in one visit, so follow-up evaluations are very important.  Your vital signs today were: BP (!) 181/111    Pulse 93    Temp 98.5 F (36.9 C)    Resp 18    SpO2 98%  If your blood pressure (bp) was elevated above 135/85 this visit, please have this repeated by your doctor within one month. --------------

## 2017-07-21 NOTE — ED Provider Notes (Signed)
[facility]-EMERGENCY DEPT Provider Note   CSN: [id number] Arrival date & time: 07/21/17  0405     History   Chief Complaint Chief Complaint  Patient presents with  . Emesis  . Hyperglycemia    HPI Latoya Cox is a 52 y.o. female.  HPI  Patient is a 52 year old female with a history of type 2 diabetes mellitus protheses on Jardiance close parentheses, arthritis, hypertension, and abdominal surgical history significant for gastric sleeve in July 2018 presenting for nausea, vomiting, and diffuse abdominal pain.  Patient reports that her emesis began last night after she went to sleep.  Patient reports she had eaten out at [employer] at 8 PM, and felt that it had unsettled her stomach.  Patient does not know how many times she has vomited over the past 12 hours, but reports multiple times an hour.  Patient reports that the lower abdominal pain started in the setting of her emesis.  Patient denies focality of the pain, and reports that it is sharp and crampy in a bandlike pattern across her lower abdomen.  Patient reports that emesis is nonbilious and nonbloody.  Last bowel movement yesterday and normal for patient without melena or hematochezia.  Patient denies passing flatus recently.  Patient denies fever or chills.  Patient denies dysuria, urgency, frequency, vaginal discharge, or vaginal bleeding.  Patient reports LMP unknown she is perimenopausal.  Patient denies chest pain or shortness of breath.  Past Medical History:  Diagnosis Date  . Arthritis   . Chest wall pain   . Diabetes mellitus without complication (HCC)   . Hypertension     Patient Active Problem List   Diagnosis Date Noted  . Overweight (BMI 25.0-29.9) 05/14/2017  . Acute pain of right knee 09/11/2016  . Routine general medical examination at a health care facility 08/02/2016  . Neck fullness 01/27/2016  . Diabetes mellitus type 2 in obese (HCC) 05/17/2015  . Essential  hypertension 05/17/2015  . Vitamin D deficiency 05/17/2015    Past Surgical History:  Procedure Laterality Date  . aneurism repair    . lapband       OB History    Gravida  3   Para      Term      Preterm      AB  3   Living        SAB  3   TAB      Ectopic      Multiple      Live Births               Home Medications    Prior to Admission medications   Medication Sig Start Date End Date Taking? Authorizing Provider  aspirin 81 MG chewable tablet Chew 81 mg by mouth daily.    [provider]  carvedilol (COREG) 12.5 MG tablet TAKE 1 TABLET BY MOUTH 2 TIMES A DAY WITH MEALS. 07/25/16   [provider]  CLEVER CHEK LANCETS MISC Use daily to check sugars. 08/30/15   Myrlene Brokerrawford, Elizabeth A, MD  diclofenac (VOLTAREN) 75 MG EC tablet TAKE 1 TABLET BY MOUTH TWICE A DAY 06/24/17   Myrlene Brokerrawford, Elizabeth A, MD  Doxylamine Succinate, Sleep, (SLEEP AID PO) Take 1 tablet by mouth at bedtime.    [provider]  empagliflozin (JARDIANCE) 25 MG TABS tablet Take 25 mg by mouth daily. 01/10/17   Myrlene Brokerrawford, Elizabeth A, MD  fluconazole (DIFLUCAN) 150 MG tablet Take 1 tablet (150 mg  total) by mouth every 3 (three) days. 08/03/16   Myrlene Broker, MD  fluticasone Aleda Grana) 50 MCG/ACT nasal spray Place 1 spray daily as needed into both nostrils for allergies. 03/04/17   Myrlene Broker, MD  glucose blood (CLEVER CHOICE MICRO TEST) test strip Use as instructed 08/30/15   Myrlene Broker, MD  glucose blood (ONETOUCH VERIO) test strip 1 each by Other route 2 (two) times daily. Use to check blood sugars twice a day Dx E11.9 01/04/16   Myrlene Broker, MD  nystatin-triamcinolone ointment (MYCOLOG) APPLY TO AFFECTED AREA TWICE DAILY 01/29/17   Myrlene Broker, MD  omeprazole (PRILOSEC) 40 MG capsule Take 1 capsule (40 mg total) daily by mouth. 03/04/17   Myrlene Broker, MD  ondansetron (ZOFRAN) 8 MG tablet Take 8 mg by mouth daily as needed  for nausea or vomiting.  08/30/14   [provider]  Dola Argyle LANCETS 33G MISC Use to help check blood sugars twice a day Dx E11.9 01/04/16   Myrlene Broker, MD  potassium chloride SA (K-DUR,KLOR-CON) 20 MEQ tablet Take 2 tablets (40 mEq total) by mouth daily. 05/17/17   Myrlene Broker, MD    Family History Family History  Problem Relation Age of Onset  . Stroke Mother   . Hypertension Mother   . Arthritis Mother   . Diabetes Mother   . Arthritis Father   . Hypertension Father   . Diabetes Father     Social History Social History   Tobacco Use  . Smoking status: Never Smoker  . Smokeless tobacco: Never Used  Substance Use Topics  . Alcohol use: No  . Drug use: No     Allergies   Hydrocodone   Review of Systems Review of Systems  Constitutional: Negative for chills and fever.  HENT: Negative for congestion and rhinorrhea.   Eyes: Negative for visual disturbance.  Respiratory: Negative for cough, chest tightness and shortness of breath.   Cardiovascular: Negative for chest pain.  Gastrointestinal: Positive for abdominal pain, nausea and vomiting. Negative for blood in stool and diarrhea.  Genitourinary: Negative for dysuria and flank pain.  Musculoskeletal: Negative for back pain and myalgias.  Skin: Negative for rash.  Neurological: Negative for dizziness, syncope, light-headedness and headaches.     Physical Exam Updated Vital Signs BP (!) 161/108 (BP Location: Right Arm)   Pulse (!) 107   Temp 98.5 F (36.9 C)   Resp 14   SpO2 99%   Physical Exam  Constitutional: She appears well-developed and well-nourished. No distress.  HENT:  Head: Normocephalic and atraumatic.  Mouth/Throat: Oropharynx is clear and moist.  Eyes: Pupils are equal, round, and reactive to light. Conjunctivae and EOM are normal.  Neck: Normal range of motion. Neck supple.  Cardiovascular: Normal rate, regular rhythm, S1 normal and S2 normal.  No murmur  heard. Pulmonary/Chest: Effort normal and breath sounds normal. She has no wheezes. She has no rales.  Abdominal: Soft. She exhibits no distension. There is tenderness. There is no guarding.  Diffuse tenderness of the lower abdomen, slightly worse on left than right.  No guarding or rebound.  Musculoskeletal: Normal range of motion. She exhibits no edema or deformity.  Neurological: She is alert.  Cranial nerves grossly intact. Patient moves extremities symmetrically and with good coordination.  Skin: Skin is warm and dry. No rash noted. No erythema.  Psychiatric: She has a normal mood and affect. Her behavior is normal. Judgment and thought content normal.  Nursing note and vitals reviewed.    ED Treatments / Results  Labs (all labs ordered are listed, but only abnormal results are displayed) Labs Reviewed  CBC WITH DIFFERENTIAL/PLATELET - Abnormal; Notable for the following components:      Result Value   WBC 14.9 (*)    RBC 5.61 (*)    Hemoglobin 16.0 (*)    HCT 48.7 (*)    Neutro Abs 13.4 (*)    All other components within normal limits  COMPREHENSIVE METABOLIC PANEL - Abnormal; Notable for the following components:   Potassium 3.3 (*)    Glucose, Bld 226 (*)    BUN 23 (*)    Total Bilirubin 1.7 (*)    All other components within normal limits  URINALYSIS, ROUTINE W REFLEX MICROSCOPIC - Abnormal; Notable for the following components:   Specific Gravity, Urine 1.032 (*)    Glucose, UA >=500 (*)    Ketones, ur 80 (*)    Protein, ur 30 (*)    Bacteria, UA RARE (*)    Squamous Epithelial / LPF 0-5 (*)    All other components within normal limits  CBG MONITORING, ED - Abnormal; Notable for the following components:   Glucose-Capillary 207 (*)    All other components within normal limits  PREGNANCY, URINE  TROPONIN I  I-STAT TROPONIN, ED    EKG EKG Interpretation  Date/Time:  Sunday July 21 2017 06:42:22 EDT Ventricular Rate:  107 PR Interval:    QRS  Duration: 144 QT Interval:  399 QTC Calculation: 533 R Axis:   -19 Text Interpretation:  Sinus tachycardia Right atrial enlargement Left ventricular hypertrophy Probable anterior infarct, age indeterminate Lateral leads are also involved Prolonged QT interval LVH changes more pronounced Confirmed by Glynn Octave [phone]) on 07/21/2017 6:46:33 AM   Radiology No results found.  Procedures Procedures (including critical care time)  Medications Ordered in ED Medications  iopamidol (ISOVUE-300) 61 % injection (has no administration in time range)  sodium chloride 0.9 % injection (has no administration in time range)  morphine 4 MG/ML injection 4 mg (has no administration in time range)  ondansetron (ZOFRAN-ODT) disintegrating tablet 4 mg (4 mg Oral Given 07/21/17 0500)  sodium chloride 0.9 % bolus 1,000 mL (0 mLs Intravenous Stopped 07/21/17 0651)  iopamidol (ISOVUE-300) 61 % injection 100 mL (100 mLs Intravenous Contrast Given 07/21/17 0700)     Initial Impression / Assessment and Plan / ED Course  I have reviewed the triage vital signs and the nursing notes.  Pertinent labs & imaging results that were available during my care of the patient were reviewed by me and considered in my medical decision making (see chart for details).  Clinical Course as of Jul 22 1134  Sun Jul 21, 2017  0645 Spoke with [facility] in lab regarding add on of Troponin.    [AM]  [phone] Patient reassessed. Patient reporting pain improved and initially declining any analgesia but reports that she would like analgesia at this time.    [AM]  [phone] Patient reassessed.  Patient feeling well without any further emesis.  Patient reports that her abdominal pain is lessening.   [AM]  1030 Patient reassessed.  Abdominal pain is gone.  Patient does report that she feels morphine made her nauseous, and had an episode of emesis. Patient is not tachycardic at this time.   [AM]  1131 Patient reassessed.  Patient reports she is  asymptomatic with nausea or abdominal pain at this time.  Patient became hypertensive, but  did not take her antihypertensives.  They were taken in room by patient.  Patient without chest pain, shortness of breath, headache at this time.   [AM]    Clinical Course User Index [AM] Elisha Ponder, PA-C   Laboratory workup thus far significant for hyperglycemia of 226.  Patient's was leukocytosis of 14.9 with associated hemoglobin of 16.  Suspect hemoconcentration versus reactive.  Potassium 3.3.  Will replete.  Patient is on chronic potassium supplementation.  Creatinine normal, BUN slightly elevated at 23, likely secondary to dehydration.  Urinalysis shows no evidence of infection.  Of note, EKG demonstrates findings consistent with LVH, and T wave inversions in anterolateral leads.  Appears more pronounced than prior.  Patient not having active chest pain or shortness of breath.  Patient reports that this is been noted to her, and she has a cardiologist, who she is due to follow up with.  No ischemic cardiac history.  Patient ambulatory, non-tachycardic, asymptomatic of nausea or abdominal pain and tolerating p.o. at time of discharge.  I personally checked patient's pulse by hand, as monitored readings were reading tachycardic.  Patient's pulse was 96 at discharge.  Patient given return precautions for any chest pain, shortness of breath, trouble nausea or vomiting, dizziness, lightheadedness, or focal abdominal pain.  Patient to follow-up with cardiology and her primary care provider.  Patient is understanding agrees with plan of care.  Final Clinical Impressions(s) / ED Diagnoses   Final diagnoses:  Abnormal EKG  Lower abdominal pain  Hypokalemia  Non-intractable vomiting with nausea, unspecified vomiting type    ED Discharge Orders    None       Delia Chimes 07/21/17 1137    Tilden Fossa, MD 07/22/17 [phone]

## 2017-08-08 ENCOUNTER — Other Ambulatory Visit: Payer: Self-pay | Admitting: Internal Medicine

## 2017-09-03 ENCOUNTER — Telehealth: Payer: Self-pay | Admitting: Internal Medicine

## 2017-09-03 NOTE — Telephone Encounter (Unsigned)
Copied from CRM [id number]. Topic: Quick Communication - Rx Refill/Question >> Sep 03, 2017  2:59 PM Raquel Sarna wrote: empagliflozin (JARDIANCE) 25 MG TABS tablet omeprazole (PRILOSEC) 40 MG capsule  Needing refills  [id number] [place], [place] - [address] [place] [address] Phone: [phone] Fax: [phone]

## 2017-09-03 NOTE — Telephone Encounter (Signed)
Pt called in to follow up on refill request. Pt says that she is completely out of both medications.   Please assist further.

## 2017-09-04 ENCOUNTER — Other Ambulatory Visit: Payer: Self-pay

## 2017-09-04 MED ORDER — EMPAGLIFLOZIN 25 MG PO TABS: 25.0000 mg | ORAL_TABLET | Freq: Every day | ORAL | 0 refills | Status: DC

## 2017-09-04 MED ORDER — OMEPRAZOLE 40 MG PO CPDR: 40.0000 mg | DELAYED_RELEASE_CAPSULE | Freq: Every day | ORAL | 0 refills | Status: DC

## 2017-09-04 NOTE — Telephone Encounter (Signed)
Patient checking status, call back [phone]

## 2017-09-28 ENCOUNTER — Other Ambulatory Visit: Payer: Self-pay | Admitting: Internal Medicine

## 2017-09-30 NOTE — Telephone Encounter (Signed)
Please advise regarding refill request for Diclofenac Sod 75 mg in Dr. Frutoso Chaserawford's absence.   Thanks

## 2017-10-01 ENCOUNTER — Telehealth: Payer: Self-pay | Admitting: Internal Medicine

## 2017-10-03 ENCOUNTER — Other Ambulatory Visit: Payer: Self-pay

## 2017-10-03 MED ORDER — EMPAGLIFLOZIN 25 MG PO TABS: 25.0000 mg | ORAL_TABLET | Freq: Every day | ORAL | 0 refills | Status: DC

## 2017-10-03 MED ORDER — OMEPRAZOLE 40 MG PO CPDR: 40.0000 mg | DELAYED_RELEASE_CAPSULE | Freq: Every day | ORAL | 0 refills | Status: DC

## 2017-10-03 NOTE — Telephone Encounter (Signed)
Patient needs refill and made appt for next Thursday the 13 with Dr. Okey Duprerawford and would like enough to last her until her appt.

## 2017-10-10 ENCOUNTER — Ambulatory Visit: Payer: Self-pay | Admitting: Internal Medicine

## 2017-10-14 ENCOUNTER — Ambulatory Visit: Payer: Self-pay | Admitting: Internal Medicine

## 2017-10-14 DIAGNOSIS — Z0289 Encounter for other administrative examinations: Secondary | ICD-10-CM

## 2017-10-18 ENCOUNTER — Ambulatory Visit (INDEPENDENT_AMBULATORY_CARE_PROVIDER_SITE_OTHER): Payer: BLUE CROSS/BLUE SHIELD | Admitting: Internal Medicine

## 2017-10-18 ENCOUNTER — Encounter: Payer: Self-pay | Admitting: Internal Medicine

## 2017-10-18 ENCOUNTER — Other Ambulatory Visit (INDEPENDENT_AMBULATORY_CARE_PROVIDER_SITE_OTHER): Payer: BLUE CROSS/BLUE SHIELD

## 2017-10-18 VITALS — BP 130/90 | HR 93 | Temp 98.8°F | Ht 63.0 in | Wt 163.0 lb

## 2017-10-18 DIAGNOSIS — E1169 Type 2 diabetes mellitus with other specified complication: Secondary | ICD-10-CM

## 2017-10-18 DIAGNOSIS — I10 Essential (primary) hypertension: Secondary | ICD-10-CM

## 2017-10-18 DIAGNOSIS — E669 Obesity, unspecified: Secondary | ICD-10-CM | POA: Diagnosis not present

## 2017-10-18 DIAGNOSIS — E663 Overweight: Secondary | ICD-10-CM

## 2017-10-18 DIAGNOSIS — E876 Hypokalemia: Secondary | ICD-10-CM

## 2017-10-18 LAB — COMPREHENSIVE METABOLIC PANEL
ALK PHOS: 81 U/L (ref 39–117)
ALT: 14 U/L (ref 0–35)
AST: 14 U/L (ref 0–37)
Albumin: 4.4 g/dL (ref 3.5–5.2)
BUN: 13 mg/dL (ref 6–23)
CHLORIDE: 103 meq/L (ref 96–112)
CO2: 30 mEq/L (ref 19–32)
CREATININE: 0.8 mg/dL (ref 0.40–1.20)
Calcium: 9.3 mg/dL (ref 8.4–10.5)
GFR: 97.49 mL/min (ref >60.00)
Glucose, Bld: 209 mg/dL — ABNORMAL HIGH (ref 70–99)
Potassium: 3 mEq/L — ABNORMAL LOW (ref 3.5–5.1)
SODIUM: 143 meq/L (ref 135–145)
TOTAL PROTEIN: 7.2 g/dL (ref 6.0–8.3)
Total Bilirubin: 1.3 mg/dL — ABNORMAL HIGH (ref 0.2–1.2)

## 2017-10-18 LAB — HEMOGLOBIN A1C: HEMOGLOBIN A1C: 8.6 % — AB (ref 4.6–6.5)

## 2017-10-18 MED ORDER — OMEPRAZOLE 40 MG PO CPDR: 40.0000 mg | DELAYED_RELEASE_CAPSULE | Freq: Every day | ORAL | 3 refills | Status: DC

## 2017-10-18 MED ORDER — EMPAGLIFLOZIN 25 MG PO TABS: 25.0000 mg | ORAL_TABLET | Freq: Every day | ORAL | 3 refills | Status: DC

## 2017-10-18 MED ORDER — DICLOFENAC SODIUM 75 MG PO TBEC: 75.0000 mg | DELAYED_RELEASE_TABLET | Freq: Two times a day (BID) | ORAL | 11 refills | Status: DC

## 2017-10-18 NOTE — Assessment & Plan Note (Signed)
BP borderline today on her coreg, she does not want change today but agrees to monitor at home. Checking CMP.

## 2017-10-18 NOTE — Assessment & Plan Note (Signed)
Checking HgA1c, foot exam done. Taking jardiance at this time. Adjust if needed. She is still working on weight loss. Last HgA1c slightly above goal of 7.5.

## 2017-10-18 NOTE — Progress Notes (Signed)
   Subjective:    Patient ID: Latoya Cox, female    DOB: 02/27/1966, 52 y.o.   MRN: [id number]  HPI The patient is a 52 YO female coming in for follow up of her weight (down about 5 pounds since last visit, goal is still about 10-15 pounds down from current, weight loss procedure, still struggling with night time snacking and bariatric clinic gave her some medicine to take if needed in the evening, not exercising currently, doing well with diet) and diabetes (taking jardiance, denies low sugars, denies UTI or yeast infection, no complications currently) and her blood pressure (BP borderline today, taking her coreg and jardiance, chronic hypokalemia).   Review of Systems  Constitutional: Negative.   HENT: Negative.   Eyes: Negative.   Respiratory: Negative for cough, chest tightness and shortness of breath.   Cardiovascular: Negative for chest pain, palpitations and leg swelling.  Gastrointestinal: Negative for abdominal distention, abdominal pain, constipation, diarrhea, nausea and vomiting.  Musculoskeletal: Negative.   Skin: Negative.   Neurological: Negative.   Psychiatric/Behavioral: Negative.       Objective:   Physical Exam  Constitutional: She is oriented to person, place, and time. She appears well-developed and well-nourished.  HENT:  Head: Normocephalic and atraumatic.  Eyes: EOM are normal.  Neck: Normal range of motion.  Cardiovascular: Normal rate and regular rhythm.  Pulmonary/Chest: Effort normal and breath sounds normal. No respiratory distress. She has no wheezes. She has no rales.  Abdominal: Soft. Bowel sounds are normal. She exhibits no distension. There is no tenderness. There is no rebound.  Musculoskeletal: She exhibits no edema.  Neurological: She is alert and oriented to person, place, and time. Coordination normal.  Skin: Skin is warm and dry.  Psychiatric: She has a normal mood and affect.   Vitals:   10/18/17 1051  BP: 130/90  Pulse: 93  Temp: 98.8  F (37.1 C)  TempSrc: Oral  SpO2: 99%  Weight: 163 lb (73.9 kg)  Height: 5\' 3"  (1.6 m)      Assessment & Plan:

## 2017-10-18 NOTE — Assessment & Plan Note (Signed)
Chronic and she takes potassium daily. Checking CMP and adjust as needed. Not on any medications which should contribute.

## 2017-10-18 NOTE — Patient Instructions (Signed)
We will check the labs today. Keep up the good work! You are down 5-6 pounds since last time.

## 2017-10-18 NOTE — Assessment & Plan Note (Signed)
Weight is down about 5 pounds since last visit and she is still working on weight loss of about another 10 pounds. Working with bariatric clinic currently.

## 2017-10-23 ENCOUNTER — Telehealth: Payer: Self-pay | Admitting: Internal Medicine

## 2017-10-23 NOTE — Telephone Encounter (Signed)
Left message for pt to return call to office for lab results  

## 2017-10-23 NOTE — Telephone Encounter (Signed)
Left VM for patient to return call; additional questions from Dr. Okey Duprerawford.

## 2017-10-23 NOTE — Telephone Encounter (Signed)
Pt is calling back . Please advise

## 2017-10-23 NOTE — Telephone Encounter (Signed)
Patient returning call Copied from CRM [id number]. Topic: Quick Communication - Lab Results >> Oct 22, 2017  3:27 PM Josetta HuddleGulch, Briana R, CMA wrote: Notified patient regarding lab results.  LVM informing patient of lab results and to call back and let us know if she is wiling to make changes to her diabetes medications and to her potassium dosage. Also need to know how much potassium patient is currently taking

## 2017-11-01 NOTE — Telephone Encounter (Signed)
Pt called back and stated she is taking 2 Klor-cons or total of 40 MEq potassium per day.  Pt stated she is willing to try other diabetes medications.  Pt is requesting to call her on her cell phone [phone]) if something is called in to her pharmacy.    Notes recorded by Myrlene Brokerrawford, Elizabeth A, MD on 10/22/2017 at 3:10 PM EDT Please call and let her know that the sugars are doing worse and we need to make some changes to her medicines. The potassium is low and we should make some changes to her dose. How much potassium is she taking right now?

## 2017-11-01 NOTE — Telephone Encounter (Signed)
Routed to Dr. Okey Duprerawford through result notes

## 2017-11-05 ENCOUNTER — Other Ambulatory Visit: Payer: Self-pay | Admitting: Internal Medicine

## 2017-11-05 DIAGNOSIS — E876 Hypokalemia: Secondary | ICD-10-CM

## 2017-11-05 MED ORDER — POTASSIUM CHLORIDE CRYS ER 20 MEQ PO TBCR: 60.0000 meq | EXTENDED_RELEASE_TABLET | Freq: Every day | ORAL | 11 refills | Status: DC

## 2017-11-05 MED ORDER — PIOGLITAZONE HCL 30 MG PO TABS: 30.0000 mg | ORAL_TABLET | Freq: Every day | ORAL | 1 refills | Status: DC

## 2018-01-01 ENCOUNTER — Ambulatory Visit (INDEPENDENT_AMBULATORY_CARE_PROVIDER_SITE_OTHER): Payer: BLUE CROSS/BLUE SHIELD | Admitting: Family

## 2018-01-01 ENCOUNTER — Encounter: Payer: Self-pay | Admitting: Family

## 2018-01-01 VITALS — BP 142/88 | HR 96 | Temp 98.4°F | Ht 63.0 in | Wt 172.0 lb

## 2018-01-01 DIAGNOSIS — J019 Acute sinusitis, unspecified: Secondary | ICD-10-CM | POA: Diagnosis not present

## 2018-01-01 MED ORDER — AMOXICILLIN-POT CLAVULANATE 875-125 MG PO TABS: 1.0000 | ORAL_TABLET | Freq: Two times a day (BID) | ORAL | 0 refills | Status: DC

## 2018-01-01 NOTE — Progress Notes (Signed)
Latoya Cox is a 52 y.o. female with the following history as recorded in [employer]:  Patient Active Problem List   Diagnosis Date Noted  . Hypokalemia 10/18/2017  . Overweight (BMI 25.0-29.9) 05/14/2017  . Acute pain of right knee 09/11/2016  . Routine general medical examination at a health care facility 08/02/2016  . Neck fullness 01/27/2016  . Diabetes mellitus type 2 in obese (HCC) 05/17/2015  . Essential hypertension 05/17/2015  . Vitamin D deficiency 05/17/2015    Current Outpatient Medications  Medication Sig Dispense Refill  . Biotin 65681 MCG TABS Take 1 tablet by mouth daily.    Marland Kitchen CLEVER CHEK LANCETS MISC Use daily to check sugars. 100 each 11  . diclofenac (VOLTAREN) 75 MG EC tablet Take 1 tablet (75 mg total) by mouth 2 (two) times daily. 60 tablet 11  . Diethylpropion HCl 25 MG TABS Take 1 tablet by mouth every evening.    . empagliflozin (JARDIANCE) 25 MG TABS tablet Take 25 mg by mouth daily. 90 tablet 3  . fluconazole (DIFLUCAN) 150 MG tablet Take 1 tablet (150 mg total) by mouth every 3 (three) days. 3 tablet 0  . fluticasone (FLONASE) 50 MCG/ACT nasal spray Place 1 spray daily as needed into both nostrils for allergies. 16 g 11  . glucose blood (CLEVER CHOICE MICRO TEST) test strip Use as instructed 100 each 12  . glucose blood (ONETOUCH VERIO) test strip 1 each by Other route 2 (two) times daily. Use to check blood sugars twice a day Dx E11.9 100 each 3  . Multiple Vitamins-Minerals (MULTIVITAMIN ADULT) TABS Take 1 tablet by mouth daily.    Marland Kitchen nystatin-triamcinolone ointment (MYCOLOG) APPLY TO AFFECTED AREA TWICE DAILY 120 g 11  . omeprazole (PRILOSEC) 40 MG capsule Take 1 capsule (40 mg total) by mouth daily. 90 capsule 3  . ONETOUCH DELICA LANCETS 33G MISC Use to help check blood sugars twice a day Dx E11.9 100 each 3  . PHENDIMETRAZINE TARTRATE PO Take 105 mg by mouth daily.    . pioglitazone (ACTOS) 30 MG tablet Take 1 tablet (30 mg total) by mouth daily. 90  tablet 1  . potassium chloride SA (K-DUR,KLOR-CON) 20 MEQ tablet Take 3 tablets (60 mEq total) by mouth daily. 90 tablet 11  . amoxicillin-clavulanate (AUGMENTIN) 875-125 MG tablet Take 1 tablet by mouth 2 (two) times daily. 20 tablet 0  . carvedilol (COREG) 12.5 MG tablet TAKE 1 TABLET BY MOUTH 2 TIMES A DAY WITH MEALS.  6  . Doxylamine Succinate, Sleep, (SLEEP AID PO) Take 1 tablet by mouth at bedtime.     No current facility-administered medications for this visit.     Allergies: Hydrocodone  Past Medical History:  Diagnosis Date  . Arthritis   . Chest wall pain   . Diabetes mellitus without complication (HCC)   . Hypertension     Past Surgical History:  Procedure Laterality Date  . aneurism repair    . lapband      Family History  Problem Relation Age of Onset  . Stroke Mother   . Hypertension Mother   . Arthritis Mother   . Diabetes Mother   . Arthritis Father   . Hypertension Father   . Diabetes Father     Social History   Tobacco Use  . Smoking status: Never Smoker  . Smokeless tobacco: Never Used  Substance Use Topics  . Alcohol use: No    Subjective:  Patient presents with concerns for possible sinus infection; symptoms  x 1 week; + post-nasal drip; + cough; has taken Zyrtec and Flonase; +  Hoarseness; + sinus pressure;   Objective:  Vitals:   01/01/18 1531  BP: (!) 142/88  Pulse: 96  Temp: 98.4 F (36.9 C)  TempSrc: Oral  SpO2: 96%  Weight: 172 lb 0.6 oz (78 kg)  Height: 5\' 3"  (1.6 m)    General: Well developed, well nourished, in no acute distress  Skin : Warm and dry.  Head: Normocephalic and atraumatic  Eyes: Sclera and conjunctiva clear; pupils round and reactive to light; extraocular movements intact  Ears: External normal; canals clear; tympanic membranes congested/ mildly erythematous Oropharynx: Pink, supple. No suspicious lesions  Neck: Supple without thyromegaly, adenopathy  Lungs: Respirations unlabored; clear to auscultation  bilaterally without wheeze, rales, rhonchi  CVS exam: normal rate and regular rhythm.  Neurologic: Alert and oriented; speech intact; face symmetrical; moves all extremities well; CNII-XII intact without focal deficit   Assessment:  1. Acute sinusitis, recurrence not specified, unspecified location     Plan:  Rx for Augmentin 875 mg bid x 10 days; continue Zyrtec and Flonase; follow-up worse, no better; She will return in 4 weeks with her PCP for diabetes follow-up- will get flu shot at that time.    Return in about 4 weeks (around 01/29/2018) for with Dr. Okey Dupre.  No orders of the defined types were placed in this encounter.   Requested Prescriptions   Signed Prescriptions Disp Refills  . amoxicillin-clavulanate (AUGMENTIN) 875-125 MG tablet 20 tablet 0    Sig: Take 1 tablet by mouth 2 (two) times daily.

## 2018-01-29 ENCOUNTER — Ambulatory Visit: Payer: BLUE CROSS/BLUE SHIELD | Admitting: Nurse Practitioner

## 2018-01-30 ENCOUNTER — Ambulatory Visit: Payer: BLUE CROSS/BLUE SHIELD | Admitting: Nurse Practitioner

## 2018-01-30 DIAGNOSIS — Z0289 Encounter for other administrative examinations: Secondary | ICD-10-CM

## 2018-01-31 ENCOUNTER — Ambulatory Visit (INDEPENDENT_AMBULATORY_CARE_PROVIDER_SITE_OTHER): Payer: BLUE CROSS/BLUE SHIELD | Admitting: Nurse Practitioner

## 2018-01-31 ENCOUNTER — Encounter: Payer: Self-pay | Admitting: Nurse Practitioner

## 2018-01-31 VITALS — BP 130/94 | HR 96 | Temp 98.6°F | Ht 63.0 in | Wt 167.0 lb

## 2018-01-31 DIAGNOSIS — J019 Acute sinusitis, unspecified: Secondary | ICD-10-CM

## 2018-01-31 MED ORDER — AMOXICILLIN-POT CLAVULANATE 875-125 MG PO TABS: 1.0000 | ORAL_TABLET | Freq: Two times a day (BID) | ORAL | 0 refills | Status: DC

## 2018-01-31 NOTE — Patient Instructions (Signed)
Please complete antibiotic course as prescribed Continue flonase, zyrtec.  Follow up with ENT   Sinusitis, Adult Sinusitis is soreness and inflammation of your sinuses. Sinuses are hollow spaces in the bones around your face. They are located:  Around your eyes.  In the middle of your forehead.  Behind your nose.  In your cheekbones.  Your sinuses and nasal passages are lined with a stringy fluid (mucus). Mucus normally drains out of your sinuses. When your nasal tissues get inflamed or swollen, the mucus can get trapped or blocked so air cannot flow through your sinuses. This lets bacteria, viruses, and funguses grow, and that leads to infection. Follow these instructions at home: Medicines  Take, use, or apply over-the-counter and prescription medicines only as told by your doctor. These may include nasal sprays.  If you were prescribed an antibiotic medicine, take it as told by your doctor. Do not stop taking the antibiotic even if you start to feel better. Hydrate and Humidify  Drink enough water to keep your pee (urine) clear or pale yellow.  Use a cool mist humidifier to keep the humidity level in your home above 50%.  Breathe in steam for 10-15 minutes, 3-4 times a day or as told by your doctor. You can do this in the bathroom while a hot shower is running.  Try not to spend time in cool or dry air. Rest  Rest as much as possible.  Sleep with your head raised (elevated).  Make sure to get enough sleep each night. General instructions  Put a warm, moist washcloth on your face 3-4 times a day or as told by your doctor. This will help with discomfort.  Wash your hands often with soap and water. If there is no soap and water, use hand sanitizer.  Do not smoke. Avoid being around people who are smoking (secondhand smoke).  Keep all follow-up visits as told by your doctor. This is important. Contact a doctor if:  You have a fever.  Your symptoms get  worse.  Your symptoms do not get better within 10 days. Get help right away if:  You have a very bad headache.  You cannot stop throwing up (vomiting).  You have pain or swelling around your face or eyes.  You have trouble seeing.  You feel confused.  Your neck is stiff.  You have trouble breathing. This information is not intended to replace advice given to you by your health care provider. Make sure you discuss any questions you have with your health care provider. Document Released: 10/03/2007 Document Revised: 12/11/2015 Document Reviewed: 02/09/2015 Elsevier Interactive Patient Education  [employer].

## 2018-01-31 NOTE — Progress Notes (Signed)
Latoya Cox is a 52 y.o. female with the following history as recorded in Epic[facility]:  Patient Active Problem List   Diagnosis Date Noted  . Hypokalemia 10/18/2017  . Overweight (BMI 25.0-29.9) 05/14/2017  . Acute pain of right knee 09/11/2016  . Routine general medical examination at a health care facility 08/02/2016  . Neck fullness 01/27/2016  . Diabetes mellitus type 2 in obese (HCC) 05/17/2015  . Essential hypertension 05/17/2015  . Vitamin D deficiency 05/17/2015    Current Outpatient Medications  Medication Sig Dispense Refill  . Biotin 16109 MCG TABS Take 1 tablet by mouth daily.    . carvedilol (COREG) 12.5 MG tablet TAKE 1 TABLET BY MOUTH 2 TIMES A DAY WITH MEALS.  6  . CLEVER CHEK LANCETS MISC Use daily to check sugars. 100 each 11  . diclofenac (VOLTAREN) 75 MG EC tablet Take 1 tablet (75 mg total) by mouth 2 (two) times daily. 60 tablet 11  . Diethylpropion HCl 25 MG TABS Take 1 tablet by mouth every evening.    . Doxylamine Succinate, Sleep, (SLEEP AID PO) Take 1 tablet by mouth at bedtime.    . empagliflozin (JARDIANCE) 25 MG TABS tablet Take 25 mg by mouth daily. 90 tablet 3  . fluconazole (DIFLUCAN) 150 MG tablet Take 1 tablet (150 mg total) by mouth every 3 (three) days. 3 tablet 0  . fluticasone (FLONASE) 50 MCG/ACT nasal spray Place 1 spray daily as needed into both nostrils for allergies. 16 g 11  . glucose blood (CLEVER CHOICE MICRO TEST) test strip Use as instructed 100 each 12  . glucose blood (ONETOUCH VERIO) test strip 1 each by Other route 2 (two) times daily. Use to check blood sugars twice a day Dx E11.9 100 each 3  . Multiple Vitamins-Minerals (MULTIVITAMIN ADULT) TABS Take 1 tablet by mouth daily.    Marland Kitchen nystatin-triamcinolone ointment (MYCOLOG) APPLY TO AFFECTED AREA TWICE DAILY 120 g 11  . omeprazole (PRILOSEC) 40 MG capsule Take 1 capsule (40 mg total) by mouth daily. 90 capsule 3  . ONETOUCH DELICA LANCETS 33G MISC Use to help check blood sugars twice a  day Dx E11.9 100 each 3  . PHENDIMETRAZINE TARTRATE PO Take 105 mg by mouth daily.    . pioglitazone (ACTOS) 30 MG tablet Take 1 tablet (30 mg total) by mouth daily. 90 tablet 1  . potassium chloride SA (K-DUR,KLOR-CON) 20 MEQ tablet Take 3 tablets (60 mEq total) by mouth daily. 90 tablet 11  . amoxicillin-clavulanate (AUGMENTIN) 875-125 MG tablet Take 1 tablet by mouth 2 (two) times daily. 20 tablet 0   No current facility-administered medications for this visit.     Allergies: Hydrocodone  Past Medical History:  Diagnosis Date  . Arthritis   . Chest wall pain   . Diabetes mellitus without complication (HCC)   . Hypertension     Past Surgical History:  Procedure Laterality Date  . aneurism repair    . lapband      Family History  Problem Relation Age of Onset  . Stroke Mother   . Hypertension Mother   . Arthritis Mother   . Diabetes Mother   . Arthritis Father   . Hypertension Father   . Diabetes Father     Social History   Tobacco Use  . Smoking status: Never Smoker  . Smokeless tobacco: Never Used  Substance Use Topics  . Alcohol use: No     Subjective:  Latoya Cox is here today for evaluation of  acute complaint of headaches, sinus pressure, congestion, hoarse voice, increasingly worse for the past week. Was seen here about 1 month ago for similar symptoms and given augmentin course which she completed with full relief of symptoms, now same symptoms are back. She denies fevers, chills, rhinorrhea, cp, sob, cough. Taking flonase and zyrtec at home.  ROS- See HPI  Objective:  Vitals:   01/31/18 0908  BP: (!) 130/94  Pulse: 96  Temp: 98.6 F (37 C)  TempSrc: Oral  SpO2: 99%  Weight: 167 lb (75.8 kg)  Height: 5\' 3"  (1.6 m)    General: Well developed, well nourished, in no acute distress  Skin : Warm and dry.  Head: Normocephalic and atraumatic  Eyes: Sclera and conjunctiva clear; pupils round and reactive to light; extraocular movements intact  Nose:  maxillary tenderness. Ears: External normal; canals clear; tympanic membranes dull, cloudy Oropharynx: Pink, supple. No suspicious lesions  Neck: Supple without adenopathy  Lungs: Respirations unlabored; clear to auscultation bilaterally without wheeze, rales, rhonchi  CVS exam: normal rate, regular rhythm, normal S1, S2, distal pulses intact Neurologic: Alert and oriented; speech intact; face symmetrical; moves all extremities well; CNII-XII intact without focal deficit   Assessment:  1. Acute sinusitis, recurrence not specified, unspecified location     Plan:   Rx for augmentin course-dosing and side effects discussed Continue flonase and zyrtec Due to recurrent infections, will refer to ENT for further evaluation Home management, red flags and return precautions including when to seek immediate care discussed and printed on AVS   No follow-ups on file.  Orders Placed This Encounter  Procedures  . Ambulatory referral to ENT    Referral Priority:   Routine    Referral Type:   Consultation    Referral Reason:   Specialty Services Required    Requested Specialty:   Otolaryngology    Number of Visits Requested:   1    Requested Prescriptions   Signed Prescriptions Disp Refills  . amoxicillin-clavulanate (AUGMENTIN) 875-125 MG tablet 20 tablet 0    Sig: Take 1 tablet by mouth 2 (two) times daily.

## 2018-03-13 ENCOUNTER — Ambulatory Visit: Payer: BLUE CROSS/BLUE SHIELD | Admitting: Internal Medicine

## 2018-03-14 ENCOUNTER — Ambulatory Visit: Payer: BLUE CROSS/BLUE SHIELD | Admitting: Internal Medicine

## 2018-03-14 DIAGNOSIS — Z0289 Encounter for other administrative examinations: Secondary | ICD-10-CM

## 2018-04-08 ENCOUNTER — Encounter: Payer: Self-pay | Admitting: Internal Medicine

## 2018-04-08 ENCOUNTER — Other Ambulatory Visit (INDEPENDENT_AMBULATORY_CARE_PROVIDER_SITE_OTHER): Payer: BLUE CROSS/BLUE SHIELD

## 2018-04-08 ENCOUNTER — Ambulatory Visit (INDEPENDENT_AMBULATORY_CARE_PROVIDER_SITE_OTHER): Payer: BLUE CROSS/BLUE SHIELD | Admitting: Internal Medicine

## 2018-04-08 VITALS — BP 140/100 | HR 96 | Temp 98.5°F | Ht 63.0 in | Wt 172.0 lb

## 2018-04-08 DIAGNOSIS — Z23 Encounter for immunization: Secondary | ICD-10-CM

## 2018-04-08 DIAGNOSIS — E1169 Type 2 diabetes mellitus with other specified complication: Secondary | ICD-10-CM

## 2018-04-08 DIAGNOSIS — I10 Essential (primary) hypertension: Secondary | ICD-10-CM

## 2018-04-08 DIAGNOSIS — Z0000 Encounter for general adult medical examination without abnormal findings: Secondary | ICD-10-CM

## 2018-04-08 DIAGNOSIS — E669 Obesity, unspecified: Secondary | ICD-10-CM

## 2018-04-08 LAB — COMPREHENSIVE METABOLIC PANEL
ALT: 13 U/L (ref 0–35)
AST: 16 U/L (ref 0–37)
Albumin: 4.2 g/dL (ref 3.5–5.2)
Alkaline Phosphatase: 79 U/L (ref 39–117)
BUN: 16 mg/dL (ref 6–23)
CO2: 28 mEq/L (ref 19–32)
Calcium: 8.9 mg/dL (ref 8.4–10.5)
Chloride: 106 mEq/L (ref 96–112)
Creatinine, Ser: 0.88 mg/dL (ref 0.40–1.20)
GFR: 87.18 mL/min (ref >60.00)
Glucose, Bld: 146 mg/dL — ABNORMAL HIGH (ref 70–99)
Potassium: 3.1 mEq/L — ABNORMAL LOW (ref 3.5–5.1)
Sodium: 141 mEq/L (ref 135–145)
Total Bilirubin: 0.7 mg/dL (ref 0.2–1.2)
Total Protein: 6.9 g/dL (ref 6.0–8.3)

## 2018-04-08 LAB — CBC
HCT: 42 % (ref 36.0–46.0)
Hemoglobin: 13.8 g/dL (ref 12.0–15.0)
MCHC: 32.9 g/dL (ref 30.0–36.0)
MCV: 84.9 fl (ref 78.0–100.0)
Platelets: 169 10*3/uL (ref 150.0–400.0)
RBC: 4.95 Mil/uL (ref 3.87–5.11)
RDW: 13.7 % (ref 11.5–15.5)
WBC: 7.7 10*3/uL (ref 4.0–10.5)

## 2018-04-08 LAB — HEMOGLOBIN A1C: Hgb A1c MFr Bld: 6.8 % — ABNORMAL HIGH (ref 4.6–6.5)

## 2018-04-08 MED ORDER — AMOXICILLIN-POT CLAVULANATE 875-125 MG PO TABS: 1.0000 | ORAL_TABLET | Freq: Two times a day (BID) | ORAL | 0 refills | Status: DC

## 2018-04-08 NOTE — Assessment & Plan Note (Signed)
Eye exam done per patient getting records. Checking HgA1c and lipid panel. Taking jardiance and actos. Adjust as needed.

## 2018-04-08 NOTE — Patient Instructions (Signed)
We have sent in the augmentin for the sinuses to take 1 pill twice a day for 10 days.   Health Maintenance, Female Adopting a healthy lifestyle and getting preventive care can go a long way to promote health and wellness. Talk with your health care provider about what schedule of regular examinations is right for you. This is a good chance for you to check in with your provider about disease prevention and staying healthy. In between checkups, there are plenty of things you can do on your own. Experts have done a lot of research about which lifestyle changes and preventive measures are most likely to keep you healthy. Ask your health care provider for more information. Weight and diet Eat a healthy diet  Be sure to include plenty of vegetables, fruits, low-fat dairy products, and lean protein.  Do not eat a lot of foods high in solid fats, added sugars, or salt.  Get regular exercise. This is one of the most important things you can do for your health. ? Most adults should exercise for at least 150 minutes each week. The exercise should increase your heart rate and make you sweat (moderate-intensity exercise). ? Most adults should also do strengthening exercises at least twice a week. This is in addition to the moderate-intensity exercise.  Maintain a healthy weight  Body mass index (BMI) is a measurement that can be used to identify possible weight problems. It estimates body fat based on height and weight. Your health care provider can help determine your BMI and help you achieve or maintain a healthy weight.  For females 11 years of age and older: ? A BMI below 18.5 is considered underweight. ? A BMI of 18.5 to 24.9 is normal. ? A BMI of 25 to 29.9 is considered overweight. ? A BMI of 30 and above is considered obese.  Watch levels of cholesterol and blood lipids  You should start having your blood tested for lipids and cholesterol at 51 years of age, then have this test every 5  years.  You may need to have your cholesterol levels checked more often if: ? Your lipid or cholesterol levels are high. ? You are older than 50 years of age. ? You are at high risk for heart disease.  Cancer screening Lung Cancer  Lung cancer screening is recommended for adults 28-38 years old who are at high risk for lung cancer because of a history of smoking.  A yearly low-dose CT scan of the lungs is recommended for people who: ? Currently smoke. ? Have quit within the past 15 years. ? Have at least a 30-pack-year history of smoking. A pack year is smoking an average of one pack of cigarettes a day for 1 year.  Yearly screening should continue until it has been 15 years since you quit.  Yearly screening should stop if you develop a health problem that would prevent you from having lung cancer treatment.  Breast Cancer  Practice breast self-awareness. This means understanding how your breasts normally appear and feel.  It also means doing regular breast self-exams. Let your health care provider know about any changes, no matter how small.  If you are in your 20s or 30s, you should have a clinical breast exam (CBE) by a health care provider every 1-3 years as part of a regular health exam.  If you are 67 or older, have a CBE every year. Also consider having a breast X-ray (mammogram) every year.  If you have a  family history of breast cancer, talk to your health care provider about genetic screening.  If you are at high risk for breast cancer, talk to your health care provider about having an MRI and a mammogram every year.  Breast cancer gene (BRCA) assessment is recommended for women who have family members with BRCA-related cancers. BRCA-related cancers include: ? Breast. ? Ovarian. ? Tubal. ? Peritoneal cancers.  Results of the assessment will determine the need for genetic counseling and BRCA1 and BRCA2 testing.  Cervical Cancer Your health care provider may  recommend that you be screened regularly for cancer of the pelvic organs (ovaries, uterus, and vagina). This screening involves a pelvic examination, including checking for microscopic changes to the surface of your cervix (Pap test). You may be encouraged to have this screening done every 3 years, beginning at age 47.  For women ages 52-65, health care providers may recommend pelvic exams and Pap testing every 3 years, or they may recommend the Pap and pelvic exam, combined with testing for human papilloma virus (HPV), every 5 years. Some types of HPV increase your risk of cervical cancer. Testing for HPV may also be done on women of any age with unclear Pap test results.  Other health care providers may not recommend any screening for nonpregnant women who are considered low risk for pelvic cancer and who do not have symptoms. Ask your health care provider if a screening pelvic exam is right for you.  If you have had past treatment for cervical cancer or a condition that could lead to cancer, you need Pap tests and screening for cancer for at least 20 years after your treatment. If Pap tests have been discontinued, your risk factors (such as having a new sexual partner) need to be reassessed to determine if screening should resume. Some women have medical problems that increase the chance of getting cervical cancer. In these cases, your health care provider may recommend more frequent screening and Pap tests.  Colorectal Cancer  This type of cancer can be detected and often prevented.  Routine colorectal cancer screening usually begins at 50 years of age and continues through 50 years of age.  Your health care provider may recommend screening at an earlier age if you have risk factors for colon cancer.  Your health care provider may also recommend using home test kits to check for hidden blood in the stool.  A small camera at the end of a tube can be used to examine your colon directly  (sigmoidoscopy or colonoscopy). This is done to check for the earliest forms of colorectal cancer.  Routine screening usually begins at age 66.  Direct examination of the colon should be repeated every 5-10 years through 50 years of age. However, you may need to be screened more often if early forms of precancerous polyps or small growths are found.  Skin Cancer  Check your skin from head to toe regularly.  Tell your health care provider about any new moles or changes in moles, especially if there is a change in a mole's shape or color.  Also tell your health care provider if you have a mole that is larger than the size of a pencil eraser.  Always use sunscreen. Apply sunscreen liberally and repeatedly throughout the day.  Protect yourself by wearing long sleeves, pants, a wide-brimmed hat, and sunglasses whenever you are outside.  Heart disease, diabetes, and high blood pressure  High blood pressure causes heart disease and increases the risk of  stroke. High blood pressure is more likely to develop in: ? People who have blood pressure in the high end of the normal range (130-139/85-89 mm Hg). ? People who are overweight or obese. ? People who are African American.  If you are 32-32 years of age, have your blood pressure checked every 3-5 years. If you are 79 years of age or older, have your blood pressure checked every year. You should have your blood pressure measured twice-once when you are at a hospital or clinic, and once when you are not at a hospital or clinic. Record the average of the two measurements. To check your blood pressure when you are not at a hospital or clinic, you can use: ? An automated blood pressure machine at a pharmacy. ? A home blood pressure monitor.  If you are between 33 years and 15 years old, ask your health care provider if you should take aspirin to prevent strokes.  Have regular diabetes screenings. This involves taking a blood sample to check your  fasting blood sugar level. ? If you are at a normal weight and have a low risk for diabetes, have this test once every three years after 50 years of age. ? If you are overweight and have a high risk for diabetes, consider being tested at a younger age or more often. Preventing infection Hepatitis B  If you have a higher risk for hepatitis B, you should be screened for this virus. You are considered at high risk for hepatitis B if: ? You were born in a country where hepatitis B is common. Ask your health care provider which countries are considered high risk. ? Your parents were born in a high-risk country, and you have not been immunized against hepatitis B (hepatitis B vaccine). ? You have HIV or AIDS. ? You use needles to inject street drugs. ? You live with someone who has hepatitis B. ? You have had sex with someone who has hepatitis B. ? You get hemodialysis treatment. ? You take certain medicines for conditions, including cancer, organ transplantation, and autoimmune conditions.  Hepatitis C  Blood testing is recommended for: ? Everyone born from [birth year] through 1963. ? Anyone with known risk factors for hepatitis C.  Sexually transmitted infections (STIs)  You should be screened for sexually transmitted infections (STIs) including gonorrhea and chlamydia if: ? You are sexually active and are younger than 50 years of age. ? You are older than 50 years of age and your health care provider tells you that you are at risk for this type of infection. ? Your sexual activity has changed since you were last screened and you are at an increased risk for chlamydia or gonorrhea. Ask your health care provider if you are at risk.  If you do not have HIV, but are at risk, it may be recommended that you take a prescription medicine daily to prevent HIV infection. This is called pre-exposure prophylaxis (PrEP). You are considered at risk if: ? You are sexually active and do not regularly use condoms  or know the HIV status of your partner(s). ? You take drugs by injection. ? You are sexually active with a partner who has HIV.  Talk with your health care provider about whether you are at high risk of being infected with HIV. If you choose to begin PrEP, you should first be tested for HIV. You should then be tested every 3 months for as long as you are taking PrEP. Pregnancy  If  you are premenopausal and you may become pregnant, ask your health care provider about preconception counseling.  If you may become pregnant, take 400 to 800 micrograms (mcg) of folic acid every day.  If you want to prevent pregnancy, talk to your health care provider about birth control (contraception). Osteoporosis and menopause  Osteoporosis is a disease in which the bones lose minerals and strength with aging. This can result in serious bone fractures. Your risk for osteoporosis can be identified using a bone density scan.  If you are 46 years of age or older, or if you are at risk for osteoporosis and fractures, ask your health care provider if you should be screened.  Ask your health care provider whether you should take a calcium or vitamin D supplement to lower your risk for osteoporosis.  Menopause may have certain physical symptoms and risks.  Hormone replacement therapy may reduce some of these symptoms and risks. Talk to your health care provider about whether hormone replacement therapy is right for you. Follow these instructions at home:  Schedule regular health, dental, and eye exams.  Stay current with your immunizations.  Do not use any tobacco products including cigarettes, chewing tobacco, or electronic cigarettes.  If you are pregnant, do not drink alcohol.  If you are breastfeeding, limit how much and how often you drink alcohol.  Limit alcohol intake to no more than 1 drink per day for nonpregnant women. One drink equals 12 ounces of beer, 5 ounces of wine, or 1 ounces of hard  liquor.  Do not use street drugs.  Do not share needles.  Ask your health care provider for help if you need support or information about quitting drugs.  Tell your health care provider if you often feel depressed.  Tell your health care provider if you have ever been abused or do not feel safe at home. This information is not intended to replace advice given to you by your health care provider. Make sure you discuss any questions you have with your health care provider. Document Released: 10/30/2010 Document Revised: 09/22/2015 Document Reviewed: 01/18/2015 Elsevier Interactive Patient Education  [employer].

## 2018-04-08 NOTE — Assessment & Plan Note (Signed)
Flu shot given. Pneumonia up to date. Shingrix counseled. Tetanus up to date. Colonoscopy GI referral placed. Mammogram ordered, pap smear reminded to go to gyn for this. Counseled about sun safety and mole surveillance. Counseled about the dangers of distracted driving. Given 10 year screening recommendations.

## 2018-04-08 NOTE — Assessment & Plan Note (Signed)
BP above goal and is taking some otc cold products. Will adjust as next visit if still above goal. Taking coreg currently. If needs additional ACE-I or ARB appropriate given diabetes.

## 2018-04-08 NOTE — Progress Notes (Signed)
   Subjective:    Patient ID: Latoya NottinghamEureka Cox, female    DOB: 08/27/1965, 52 y.o.   MRN: [id number]  HPI The patient is a 52 YO female coming in for physical.   PMH, FMH, social history reviewed and updated  Review of Systems  Constitutional: Negative.   HENT: Positive for congestion, sinus pressure, sinus pain and sore throat.   Eyes: Negative.   Respiratory: Negative for cough, chest tightness and shortness of breath.   Cardiovascular: Negative for chest pain, palpitations and leg swelling.  Gastrointestinal: Negative for abdominal distention, abdominal pain, constipation, diarrhea, nausea and vomiting.  Musculoskeletal: Negative.   Skin: Negative.   Neurological: Negative.   Psychiatric/Behavioral: Negative.       Objective:   Physical Exam  Constitutional: She is oriented to person, place, and time. She appears well-developed and well-nourished.  HENT:  Head: Normocephalic and atraumatic.  Oropharynx with redness and clear drainage, nose with swollen turbinates, TMs normal bilaterally  Eyes: EOM are normal.  Neck: Normal range of motion. No thyromegaly present.  Cardiovascular: Normal rate and regular rhythm.  Pulmonary/Chest: Effort normal and breath sounds normal. No respiratory distress. She has no wheezes. She has no rales.  Abdominal: Soft. Bowel sounds are normal. She exhibits no distension. There is no tenderness. There is no rebound.  Musculoskeletal: She exhibits tenderness. She exhibits no edema.  Lymphadenopathy:    She has no cervical adenopathy.  Neurological: She is alert and oriented to person, place, and time. Coordination normal.  Skin: Skin is warm and dry.  Psychiatric: She has a normal mood and affect.   Vitals:   04/08/18 1545 04/08/18 1616  BP: (!) 160/110 (!) 140/100  Pulse: 96   Temp: 98.5 F (36.9 C)   TempSrc: Oral   SpO2: 99%   Weight: 172 lb (78 kg)   Height: 5\' 3"  (1.6 m)       Assessment & Plan:  Flu shot given at visit

## 2018-04-09 LAB — LIPID PANEL
Cholesterol: 156 mg/dL (ref 0–200)
HDL: 51.1 mg/dL (ref >39.00)
LDL CALC: 85 mg/dL (ref 0–99)
NonHDL: 104.47
Total CHOL/HDL Ratio: 3
Triglycerides: 96 mg/dL (ref 0.0–149.0)
VLDL: 19.2 mg/dL (ref 0.0–40.0)

## 2018-05-09 ENCOUNTER — Other Ambulatory Visit: Payer: Self-pay | Admitting: Internal Medicine

## 2018-05-22 ENCOUNTER — Ambulatory Visit: Payer: BLUE CROSS/BLUE SHIELD

## 2018-06-05 ENCOUNTER — Ambulatory Visit: Payer: Self-pay | Admitting: Family Medicine

## 2018-06-06 ENCOUNTER — Encounter: Payer: Self-pay | Admitting: Family

## 2018-06-06 ENCOUNTER — Ambulatory Visit (INDEPENDENT_AMBULATORY_CARE_PROVIDER_SITE_OTHER): Payer: BLUE CROSS/BLUE SHIELD | Admitting: Family

## 2018-06-06 ENCOUNTER — Encounter: Payer: Self-pay | Admitting: Internal Medicine

## 2018-06-06 VITALS — BP 132/88 | HR 82 | Temp 98.6°F | Ht 63.0 in | Wt 169.0 lb

## 2018-06-06 DIAGNOSIS — J019 Acute sinusitis, unspecified: Secondary | ICD-10-CM | POA: Diagnosis not present

## 2018-06-06 MED ORDER — HYDROCODONE-HOMATROPINE 5-1.5 MG/5ML PO SYRP: 5.0000 mL | ORAL_SOLUTION | Freq: Four times a day (QID) | ORAL | 0 refills | Status: DC | PRN

## 2018-06-06 MED ORDER — DOXYCYCLINE HYCLATE 100 MG PO TABS: 100.0000 mg | ORAL_TABLET | Freq: Two times a day (BID) | ORAL | 0 refills | Status: DC

## 2018-06-06 MED ORDER — PREDNISONE 20 MG PO TABS: 20.0000 mg | ORAL_TABLET | Freq: Every day | ORAL | 0 refills | Status: DC

## 2018-06-06 NOTE — Progress Notes (Signed)
Latoya Cox is a 53 y.o. female with the following history as recorded in [employer]:  Patient Active Problem List   Diagnosis Date Noted  . Hypokalemia 10/18/2017  . Overweight (BMI 25.0-29.9) 05/14/2017  . Routine general medical examination at a health care facility 08/02/2016  . Neck fullness 01/27/2016  . Diabetes mellitus type 2 in obese (HCC) 05/17/2015  . Essential hypertension 05/17/2015  . Vitamin D deficiency 05/17/2015    Current Outpatient Medications  Medication Sig Dispense Refill  . Biotin 88416 MCG TABS Take 1 tablet by mouth daily.    Marland Kitchen CLEVER CHEK LANCETS MISC Use daily to check sugars. 100 each 11  . diclofenac (VOLTAREN) 75 MG EC tablet Take 1 tablet (75 mg total) by mouth 2 (two) times daily. 60 tablet 11  . Diethylpropion HCl 25 MG TABS Take 1 tablet by mouth every evening.    . empagliflozin (JARDIANCE) 25 MG TABS tablet Take 25 mg by mouth daily. 90 tablet 3  . fluticasone (FLONASE) 50 MCG/ACT nasal spray Place 1 spray daily as needed into both nostrils for allergies. 16 g 11  . glucose blood (CLEVER CHOICE MICRO TEST) test strip Use as instructed 100 each 12  . glucose blood (ONETOUCH VERIO) test strip 1 each by Other route 2 (two) times daily. Use to check blood sugars twice a day Dx E11.9 100 each 3  . Multiple Vitamins-Minerals (MULTIVITAMIN ADULT) TABS Take 1 tablet by mouth daily.    Marland Kitchen nystatin-triamcinolone ointment (MYCOLOG) APPLY TO AFFECTED AREA TWICE DAILY 120 g 11  . omeprazole (PRILOSEC) 40 MG capsule Take 1 capsule (40 mg total) by mouth daily. 90 capsule 3  . ONETOUCH DELICA LANCETS 33G MISC Use to help check blood sugars twice a day Dx E11.9 100 each 3  . PHENDIMETRAZINE TARTRATE PO Take 105 mg by mouth daily.    . pioglitazone (ACTOS) 30 MG tablet TAKE 1 TABLET BY MOUTH EVERY DAY 90 tablet 1  . potassium chloride SA (K-DUR,KLOR-CON) 20 MEQ tablet Take 3 tablets (60 mEq total) by mouth daily. 90 tablet 11  . carvedilol (COREG) 12.5 MG tablet  TAKE 1 TABLET BY MOUTH 2 TIMES A DAY WITH MEALS.  6  . doxycycline (VIBRA-TABS) 100 MG tablet Take 1 tablet (100 mg total) by mouth 2 (two) times daily. 20 tablet 0  . Doxylamine Succinate, Sleep, (SLEEP AID PO) Take 1 tablet by mouth at bedtime.    Marland Kitchen HYDROcodone-homatropine (HYCODAN) 5-1.5 MG/5ML syrup Take 5 mLs by mouth every 6 (six) hours as needed for cough. 120 mL 0  . predniSONE (DELTASONE) 20 MG tablet Take 1 tablet (20 mg total) by mouth daily with breakfast. 5 tablet 0   No current facility-administered medications for this visit.     Allergies: Hydrocodone  Past Medical History:  Diagnosis Date  . Arthritis   . Chest wall pain   . Diabetes mellitus without complication (HCC)   . Hypertension     Past Surgical History:  Procedure Laterality Date  . aneurism repair    . lapband      Family History  Problem Relation Age of Onset  . Stroke Mother   . Hypertension Mother   . Arthritis Mother   . Diabetes Mother   . Arthritis Father   . Hypertension Father   . Diabetes Father     Social History   Tobacco Use  . Smoking status: Never Smoker  . Smokeless tobacco: Never Used  Substance Use Topics  . Alcohol use:  No    Subjective:  4 day history of cough, cold; + deep, barking cough; no chest pain, no fever; not sleeping well at night due to cough; using OTC Tussin; + sinus pain/ pressure; ears feel full; has taken 3 rounds of Augmentin in the past 3 months; ENT referral was discussed but defers at this time unless absolutely necessary;     Objective:  Vitals:   06/06/18 1015  BP: 132/88  Pulse: 82  Temp: 98.6 F (37 C)  TempSrc: Oral  SpO2: 98%  Weight: 169 lb 0.6 oz (76.7 kg)  Height: 5\' 3"  (1.6 m)    General: Well developed, well nourished, in no acute distress  Skin : Warm and dry.  Head: Normocephalic and atraumatic  Eyes: Sclera and conjunctiva clear; pupils round and reactive to light; extraocular movements intact  Ears: External normal; canals  clear; tympanic membranes congested bilaterally Oropharynx: Pink, supple. No suspicious lesions  Neck: Supple without thyromegaly, adenopathy  Lungs: Respirations unlabored; clear to auscultation bilaterally without wheeze, rales, rhonchi  [facility] exam: normal rate and regular rhythm.  Neurologic: Alert and oriented; speech intact; face symmetrical; moves all extremities well; CNII-XII intact without focal deficit   Assessment:  1. Acute sinusitis, recurrence not specified, unspecified location     Plan:  ? If this is chronic sinusitis or original infection from September never cleared- has been treated with Augmentin on 3 occasions this fall; will try a different antibiotic today as well as short course of Prednisone; if another infection occurs quickly, will then need to discuss ENT referral; patient in agreement; she also asks for Rx for Hycodan- notes she has taken in the past with no complications. Follow up worse, no better.    No follow-ups on file.  No orders of the defined types were placed in this encounter.   Requested Prescriptions   Signed Prescriptions Disp Refills  . HYDROcodone-homatropine (HYCODAN) 5-1.5 MG/5ML syrup 120 mL 0    Sig: Take 5 mLs by mouth every 6 (six) hours as needed for cough.  . doxycycline (VIBRA-TABS) 100 MG tablet 20 tablet 0    Sig: Take 1 tablet (100 mg total) by mouth 2 (two) times daily.  . predniSONE (DELTASONE) 20 MG tablet 5 tablet 0    Sig: Take 1 tablet (20 mg total) by mouth daily with breakfast.

## 2018-06-30 ENCOUNTER — Ambulatory Visit: Payer: Self-pay

## 2018-06-30 MED ORDER — FLUCONAZOLE 150 MG PO TABS: 150.0000 mg | ORAL_TABLET | ORAL | 0 refills | Status: AC

## 2018-06-30 NOTE — Telephone Encounter (Signed)
Incoming  Call from Patient  With complaint of  White discharge.  Denies odor.   Onset was Friday.  Denies  A  Rash. Patient states that her  Blood  Sugars have  Been  Elevated.   Patient  Request [facility]  [facility]  [facility] into  [facility]  [address]     1  Pump Back Female, [place] y.o., [birth year] MRN:  [id number] Phone:  [phone] (H) ... PCP:  Myrlene Broker, MD Coverage:  BLUE CROSS BLUE SHIELD/BCBS OTHER Message from Francesca Jewett sent at 06/30/2018 10:35 AM EST   Summary: yeast infection    Pt called in and stated that she thinks she has a yeast infection. She was taking antibiotics last month for sinus infection. She said it just started the past Friday. It has been going on for about 4 days. She wanted to know if meds could be called in to clear it up?   [facility] [address]  Best number for pt -[phone]         Call History    Type Contact  06/30/2018 10:33 AM Phone (Incoming) Latoya Cox (Self)  Phone: [phone] Judie Petit)  User: Floria Raveling A    Reason for Disposition . [1] Symptoms of a "yeast infection" (i.e., itchy, white discharge, not bad smelling) AND [2] not improved > 3 days following CARE ADVICE  Answer Assessment - Initial Assessment Questions 1. DISCHARGE: "Describe the discharge." (e.g., white, yellow, green, gray, foamy, cottage cheese-like)     white 2. ODOR: "Is there a bad odor?"    no 3. ONSET: "When did the discharge begin?"     Friday 4. RASH: "Is there a rash in that area?" If so, ask: "Describe it." (e.g., redness, blisters, sores, bumps)     denies 5. ABDOMINAL PAIN: "Are you having any abdominal pain?" If yes: "What does it feel like? " (e.g., crampy, dull, intermittent, constant)      denies 6. ABDOMINAL PAIN SEVERITY: If present, ask: "How bad is it?"  (e.g., mild, moderate, severe)  - MILD - doesn't interfere with normal activities   - MODERATE - interferes with normal activities or awakens from sleep   - SEVERE -  patient doesn't want to move (R/O peritonitis)      denies 7. CAUSE: "What do you think is causing the discharge?" "Have you had the same problem before? What happened then?"     Blood  sugars 8. OTHER SYMPTOMS: "Do you have any other symptoms?" (e.g., fever, itching, vaginal bleeding, pain with urination, injury to genital area, vaginal foreign body)     denies 9. PREGNANCY: "Is there any chance you are pregnant?" "When was your last menstrual period?"     denies  Protocols used: VAGINAL DISCHARGE-A-AH

## 2018-06-30 NOTE — Telephone Encounter (Signed)
Sent in diflucan, can take today and again on Thursday if still having symptoms.

## 2018-06-30 NOTE — Telephone Encounter (Signed)
Patient informed MD response.   

## 2018-06-30 NOTE — Addendum Note (Signed)
Addended by: Hillard Danker A on: 06/30/2018 04:06 PM   Modules accepted: Orders

## 2018-08-26 ENCOUNTER — Encounter (HOSPITAL_COMMUNITY): Payer: Self-pay | Admitting: Student

## 2018-08-26 ENCOUNTER — Emergency Department (HOSPITAL_COMMUNITY)
Admission: EM | Admit: 2018-08-26 | Discharge: 2018-08-26 | Disposition: A | Discharge status: Home / Self Care | Payer: BLUE CROSS/BLUE SHIELD | Attending: Emergency Medicine | Admitting: Emergency Medicine

## 2018-08-26 ENCOUNTER — Ambulatory Visit: Payer: Self-pay

## 2018-08-26 ENCOUNTER — Emergency Department (HOSPITAL_COMMUNITY): Payer: BLUE CROSS/BLUE SHIELD

## 2018-08-26 ENCOUNTER — Other Ambulatory Visit: Payer: Self-pay

## 2018-08-26 DIAGNOSIS — Z79899 Other long term (current) drug therapy: Secondary | ICD-10-CM | POA: Diagnosis not present

## 2018-08-26 DIAGNOSIS — I10 Essential (primary) hypertension: Secondary | ICD-10-CM | POA: Diagnosis not present

## 2018-08-26 DIAGNOSIS — R519 Headache, unspecified: Secondary | ICD-10-CM

## 2018-08-26 DIAGNOSIS — R51 Headache: Secondary | ICD-10-CM | POA: Insufficient documentation

## 2018-08-26 DIAGNOSIS — E119 Type 2 diabetes mellitus without complications: Secondary | ICD-10-CM | POA: Insufficient documentation

## 2018-08-26 MED ORDER — SODIUM CHLORIDE 0.9 % IV BOLUS
500.0000 mL | Freq: Once | INTRAVENOUS | Status: AC
  Administered 2018-08-26: 500 mL via INTRAVENOUS

## 2018-08-26 MED ORDER — PROCHLORPERAZINE EDISYLATE 10 MG/2ML IJ SOLN
10.0000 mg | Freq: Once | INTRAMUSCULAR | Status: AC
  Administered 2018-08-26: 10 mg via INTRAVENOUS
  Filled 2018-08-26: qty 2

## 2018-08-26 MED ORDER — DIPHENHYDRAMINE HCL 50 MG/ML IJ SOLN
12.5000 mg | Freq: Once | INTRAMUSCULAR | Status: AC
  Administered 2018-08-26: 12.5 mg via INTRAVENOUS
  Filled 2018-08-26: qty 1

## 2018-08-26 NOTE — ED Triage Notes (Signed)
Pt to ED with c/o HTN and headache.  St's she has had a constant headache x's 2 days.  Has taken tylenol without relief.

## 2018-08-26 NOTE — ED Provider Notes (Signed)
[facility] EMERGENCY DEPARTMENT Provider Note   CSN: [id number] Arrival date & time: 08/26/18  1814    History   Chief Complaint Chief Complaint  Patient presents with  . Hypertension  . Headache    HPI Latoya Cox is a 53 y.o. female with a hx of HTN, T2DM, s/p gastric sleeve, and prior brain aneurysm clipping who presents to the ED via EMS with complaints of HTN & headache which began last evening. Patient states that headache had gradual onset w/ steady progression located in the frontal area. It has been constant since onset, improved w/ sleeping, worsened w/ stress. States that last night her daughter was brought home by police around same time of onset of headache. W/ her headache she has noted elevated Bps w/ systolic in the 180s. Her blood pressure normally runs 120s-160s systolic. Has had some nausea & felt tired. She denies change in vision, focal weakness, numbness, dizziness, fever, chills, or neck stiffness. She has hx of similar headaches, but it has been awhile.      HPI  Past Medical History:  Diagnosis Date  . Arthritis   . Chest wall pain   . Diabetes mellitus without complication (HCC)   . Hypertension     Patient Active Problem List   Diagnosis Date Noted  . Hypokalemia 10/18/2017  . Overweight (BMI 25.0-29.9) 05/14/2017  . Routine general medical examination at a health care facility 08/02/2016  . Neck fullness 01/27/2016  . Diabetes mellitus type 2 in obese (HCC) 05/17/2015  . Essential hypertension 05/17/2015  . Vitamin D deficiency 05/17/2015    Past Surgical History:  Procedure Laterality Date  . aneurism repair    . lapband       OB History    Gravida  3   Para      Term      Preterm      AB  3   Living        SAB  3   TAB      Ectopic      Multiple      Live Births               Home Medications    Prior to Admission medications   Medication Sig Start Date End Date Taking? Authorizing  Provider  Biotin 81448 MCG TABS Take 1 tablet by mouth daily.    [provider]  carvedilol (COREG) 12.5 MG tablet TAKE 1 TABLET BY MOUTH 2 TIMES A DAY WITH MEALS. 07/25/16   [provider]  CLEVER CHEK LANCETS MISC Use daily to check sugars. 08/30/15   Myrlene Broker, MD  diclofenac (VOLTAREN) 75 MG EC tablet Take 1 tablet (75 mg total) by mouth 2 (two) times daily. 10/18/17   Myrlene Broker, MD  Diethylpropion HCl 25 MG TABS Take 1 tablet by mouth every evening.    [provider]  doxycycline (VIBRA-TABS) 100 MG tablet Take 1 tablet (100 mg total) by mouth 2 (two) times daily. 06/06/18   Olive Bass, FNP  Doxylamine Succinate, Sleep, (SLEEP AID PO) Take 1 tablet by mouth at bedtime.    [provider]  empagliflozin (JARDIANCE) 25 MG TABS tablet Take 25 mg by mouth daily. 10/18/17   Myrlene Broker, MD  fluticasone Aleda Grana) 50 MCG/ACT nasal spray Place 1 spray daily as needed into both nostrils for allergies. 03/04/17   Myrlene Broker, MD  glucose blood (CLEVER CHOICE MICRO TEST) test strip  Use as instructed 08/30/15   Myrlene Brokerrawford, Elizabeth A, MD  glucose blood [facility](ONETOUCH VERIO) test strip 1 each by Other route 2 (two) times daily. Use to check blood sugars twice a day Dx E11.9 01/04/16   Myrlene Brokerrawford, Elizabeth A, MD  HYDROcodone-homatropine [facility](HYCODAN) 5-1.5 MG/5ML syrup Take 5 mLs by mouth every 6 (six) hours as needed for cough. 06/06/18   Olive BassMurray, Laura Woodruff, FNP  Multiple Vitamins-Minerals (MULTIVITAMIN ADULT) TABS Take 1 tablet by mouth daily.    [provider]  nystatin-triamcinolone ointment (MYCOLOG) APPLY TO AFFECTED AREA TWICE DAILY 01/29/17   Myrlene Brokerrawford, Elizabeth A, MD  omeprazole (PRILOSEC) 40 MG capsule Take 1 capsule (40 mg total) by mouth daily. 10/18/17   Myrlene Brokerrawford, Elizabeth A, MD  [facility] DEL[facility] LANCETS 33G MISC Use to help check blood sugars twice a day Dx E11.9 01/04/16   Myrlene Brokerrawford, Elizabeth A, MD   PHENDIMETRAZINE TARTRATE PO Take 105 mg by mouth daily.    [provider]  pioglitazone (ACTOS) 30 MG tablet TAKE 1 TABLET BY MOUTH EVERY DAY 05/09/18   Myrlene Brokerrawford, Elizabeth A, MD  potassium chloride SA (K-DUR,KLOR-CON) 20 MEQ tablet Take 3 tablets (60 mEq total) by mouth daily. 11/05/17   Myrlene Brokerrawford, Elizabeth A, MD  predniSONE (DELTASONE) 20 MG tablet Take 1 tablet (20 mg total) by mouth daily with breakfast. 06/06/18   Olive BassMurray, Laura Woodruff, FNP    Family History Family History  Problem Relation Age of Onset  . Stroke Mother   . Hypertension Mother   . Arthritis Mother   . Diabetes Mother   . Arthritis Father   . Hypertension Father   . Diabetes Father     Social History Social History   Tobacco Use  . Smoking status: Never Smoker  . Smokeless tobacco: Never Used  Substance Use Topics  . Alcohol use: No  . Drug use: No     Allergies   Hydrocodone   Review of Systems Review of Systems  Constitutional: Negative for chills and fever.  Respiratory: Negative for shortness of breath.   Cardiovascular: Negative for chest pain.  Gastrointestinal: Positive for nausea. Negative for abdominal pain and vomiting.  Musculoskeletal: Negative for neck stiffness.  Neurological: Positive for headaches. Negative for dizziness, tremors, seizures, speech difficulty, weakness, light-headedness and numbness.  All other systems reviewed and are negative.    Physical Exam Updated Vital Signs BP (!) 155/103   Pulse 86   Temp 98.4 F (36.9 C) (Oral)   Resp 14   Ht 5\' 2"  (1.575 m)   Wt 76.2 kg   SpO2 97%   BMI 30.73 kg/m   Physical Exam Vitals signs and nursing note reviewed.  Constitutional:      General: She is not in acute distress.    Appearance: She is well-developed. She is not toxic-appearing.  HENT:     Head: Normocephalic and atraumatic.  Eyes:     General:        Right eye: No discharge.        Left eye: No discharge.     Extraocular Movements: Extraocular  movements intact.     Conjunctiva/sclera: Conjunctivae normal.     Pupils: Pupils are equal, round, and reactive to light.     Comments: No proptosis.   Neck:     Musculoskeletal: Neck supple. No neck rigidity.  Cardiovascular:     Rate and Rhythm: Normal rate and regular rhythm.  Pulmonary:     Effort: Pulmonary effort is normal. No respiratory distress.  Breath sounds: Normal breath sounds. No wheezing, rhonchi or rales.  Abdominal:     General: There is no distension.     Palpations: Abdomen is soft.     Tenderness: There is no abdominal tenderness.  Skin:    General: Skin is warm and dry.     Findings: No rash.  Neurological:     Mental Status: She is alert.     Comments: Alert. Clear speech. No facial droop. CNIII-XII grossly intact. Bilateral upper and lower extremities' sensation grossly intact. 5/5 symmetric strength with grip strength and with plantar and dorsi flexion bilaterally . Normal finger to nose bilaterally. Negative pronator drift. Negative Romberg sign. Gait is steady and intact.   Psychiatric:        Behavior: Behavior normal.    ED Treatments / Results  Labs (all labs ordered are listed, but only abnormal results are displayed) Labs Reviewed - No data to display  EKG None  Radiology Ct Head Wo Contrast  Result Date: 08/26/2018 CLINICAL DATA:  Headache.  History of craniotomy for aneurysm EXAM: CT HEAD WITHOUT CONTRAST TECHNIQUE: Contiguous axial images were obtained from the base of the skull through the vertex without intravenous contrast. COMPARISON:  CT head 04/28/2007 FINDINGS: Brain: Chronic infarct in the frontal white matter bilaterally left greater than right. Chronic infarct in the head of the caudate on the left unchanged. Small areas of infarction in the right basal ganglia are new and indeterminate for age. Negative for acute hemorrhage or mass. Vascular: Aneurysm clip left carotid region unchanged. Negative for hyperdense vessel Skull: Left  frontal craniotomy.  No acute skeletal abnormality. Sinuses/Orbits: Negative Other: None IMPRESSION: Chronic ischemic changes in the white matter and left caudate. Small infarcts in the right basal ganglia of indeterminate age. No acute hemorrhage. Postop aneurysm clipping left cavernous carotid. Negative for acute hemorrhage. Electronically Signed   By: Marlan Palau M.D.   On: 08/26/2018 19:53    Procedures Procedures (including critical care time)  Medications Ordered in ED Medications  diphenhydrAMINE (BENADRYL) injection 12.5 mg (12.5 mg Intravenous Given 08/26/18 1903)  prochlorperazine (COMPAZINE) injection 10 mg (10 mg Intravenous Given 08/26/18 1902)  sodium chloride 0.9 % bolus 500 mL (500 mLs Intravenous New Bag/Given 08/26/18 1912)     Initial Impression / Assessment and Plan / ED Course  I have reviewed the triage vital signs and the nursing notes.  Pertinent labs & imaging results that were available during my care of the patient were reviewed by me and considered in my medical decision making (see chart for details).    Patient presents with complaint of headache. Patient is nontoxic appearing, no apparent distress, vitals WNL with the exception of elevated BP. Patient has hx of similar headaches, gradual onset with steady progression in severity however w/ elevated BP & hx of surgical clipping for aneurysm CT head ordered and notable for chronic ischemic changes in the white matter and left caudate. Small infarcts in the right basal ganglia of indeterminate age. No acute hemorrhage. Postop aneurysm clipping left cavernous carotid. Negative for acute hemorrhage. Regarding small infarcts of indeterminate age- patient without focal neuro deficits, sxs resolved following migraine cocktail, does not seem consistent with acute ischemic CVA. Overall H&P w/ CT head non concerning for [facility], ICH, dural venous sinus thrombosis, acute glaucoma, giant cell arteritis, mass, or meningitis. Pt is  afebrile with no focal neuro deficits, dizziness, change in vision, proptosis, or nuchal rigidity. Patient treated for headache with migraine cocktail with resolution of  sxs, states she is ready to go home. I discussed treatment plan, need for PCP follow-up for sxs & BP, and return precautions with the patient. Provided opportunity for questions, patient confirmed understanding and is in agreement with plan.    Findings and plan of care discussed with supervising physician Dr. Lynelle Doctor who is in agreement.   Final Clinical Impressions(s) / ED Diagnoses   Final diagnoses:  Acute nonintractable headache, unspecified headache type  Hypertension, unspecified type    ED Discharge Orders    None       Desmond Lope 08/26/18 2125    Linwood Dibbles, MD 09/01/18 [phone]

## 2018-08-26 NOTE — ED Notes (Signed)
Pt has been taken to CT.

## 2018-08-26 NOTE — Telephone Encounter (Signed)
Pt c/o severe headache and weakness. BP 184/130 and today BP 180/115. Pt complained that her headache was so severe last night she could fall asleep until 3 am. Pt has missed any does of her BP medication. Pt has been feeling more stress lately but does not know what could be causing her BP to be elevated. Care advice given and pt verbalized understanding. Pt became tearful after advised to call 911. Her husband in not able to transport her to hospital.            Reason for Disposition . [1] Systolic BP  >= 160 OR Diastolic >= 100 AND [2] cardiac or neurologic symptoms (e.g., chest pain, difficulty breathing, unsteady gait, blurred vision)  Answer Assessment - Initial Assessment Questions 1. BLOOD PRESSURE: "What is the blood pressure?" "Did you take at least two measurements 5 minutes apart?"     184/130 yesterday 136/113   180/115 2. ONSET: "When did you take your blood pressure?"    yesterday 3. HOW: "How did you obtain the blood pressure?" (e.g., visiting nurse, automatic home BP monitor)     Automatic home BP machine (wrist) 4. HISTORY: "Do you have a history of high blood pressure?"     yes 5. MEDICATIONS: "Are you taking any medications for blood pressure?" "Have you missed any doses recently?"    Yes Coreg- no missed doses 6. OTHER SYMPTOMS: "Do you have any symptoms?" (e.g., headache, chest pain, blurred vision, difficulty breathing, weakness)     Headache-weakness- 9/10 7. PREGNANCY: "Is there any chance you are pregnant?" "When was your last menstrual period?"     No-  Protocols used: HIGH BLOOD PRESSURE-A-AH

## 2018-08-26 NOTE — Discharge Instructions (Signed)
You were seen in the emergency department today for a headache with elevated blood pressure.  Your CT scan showed some areas of decreased blood flow, it is difficult to determine when this occurred. We would like you to follow-up with your primary care provider regarding your somewhat abnormal CT, for a recheck of your symptoms, and for recheck of your blood pressure.  Return to the ER for new or worsening symptoms including but not limited to confusion, slurred speech, facial droop, numbness, weakness, change in headache quality/severity, or any other concerns.

## 2018-08-28 ENCOUNTER — Ambulatory Visit (INDEPENDENT_AMBULATORY_CARE_PROVIDER_SITE_OTHER): Payer: BLUE CROSS/BLUE SHIELD | Admitting: Internal Medicine

## 2018-08-28 ENCOUNTER — Encounter: Payer: Self-pay | Admitting: Internal Medicine

## 2018-08-28 DIAGNOSIS — Z8673 Personal history of transient ischemic attack (TIA), and cerebral infarction without residual deficits: Secondary | ICD-10-CM | POA: Diagnosis not present

## 2018-08-28 DIAGNOSIS — I10 Essential (primary) hypertension: Secondary | ICD-10-CM

## 2018-08-28 DIAGNOSIS — E669 Obesity, unspecified: Secondary | ICD-10-CM | POA: Diagnosis not present

## 2018-08-28 DIAGNOSIS — E1169 Type 2 diabetes mellitus with other specified complication: Secondary | ICD-10-CM

## 2018-08-28 MED ORDER — FLUCONAZOLE 150 MG PO TABS: 150.0000 mg | ORAL_TABLET | ORAL | 0 refills | Status: DC

## 2018-08-28 MED ORDER — CARVEDILOL 25 MG PO TABS: 25.0000 mg | ORAL_TABLET | Freq: Two times a day (BID) | ORAL | 3 refills | Status: DC

## 2018-08-28 NOTE — Assessment & Plan Note (Signed)
Rx for diflucan for yeast infection related to jardiance. She does not want to stop jardiance at this time.

## 2018-08-28 NOTE — Progress Notes (Signed)
Virtual Visit via Video Note  I connected with Latoya Cox on 08/28/18 at 10:40 AM EDT by a video enabled telemedicine application and verified that I am speaking with the correct person using two identifiers.   I discussed the limitations of evaluation and management by telemedicine and the availability of in person appointments. The patient expressed understanding and agreed to proceed.  History of Present Illness: The patient is a 53 y.o. female with visit for ER follow up of headache and HTN. She had been having headache for a couple of days with high BP before going to ER. She went to er and was given pain medications. She had CT head which showed chronic ischemic changes and age indeterminate stroke. She was not started on any stroke prevention medication or workup for stroke. She did not have adjustment of her BP regimen. She is still having some high BP at home and has monitor to check. She is taking coreg 12.5 mg BID and has not missed doses. Normally pulse is high 80-90s. Has mild headaches this week but less than when she went to the ER. Denies chest pains or numbness or weakness. Denies speech changes or memory changes. Overall it is stable and headache improving. Has tried tylenol for headache. Is also concerned she is getting yeast infection. Some itching and white discharge. With her jardiance she has had these in the past and they can get severe. Is mild at this time. Some slight elevation in sugar to 200 this morning which is unusual for her.   Observations/Objective: Appearance: normal, breathing appears normal, casual grooming, abdomen does not appear distended, throat normal, memory normal, mental status is A and O times 3  Assessment and Plan: See problem oriented charting  Follow Up Instructions: increase coreg to 25 mg BID, add aspirin 81 mg daily, rx for diflucan  I discussed the assessment and treatment plan with the patient. The patient was provided an opportunity to ask  questions and all were answered. The patient agreed with the plan and demonstrated an understanding of the instructions.   The patient was advised to call back or seek an in-person evaluation if the symptoms worsen or if the condition fails to improve as anticipated.  Myrlene Broker, MD

## 2018-08-28 NOTE — Assessment & Plan Note (Signed)
Add aspirin 81 mg daily, needs tight BP control and sugar control.

## 2018-08-28 NOTE — Assessment & Plan Note (Signed)
Increase coreg to 25 mg BID and monitor BP and pulse. Prior to pandemic she was reasonably controlled. With finding of old stroke on CT scan needs <120/80 if possible.

## 2018-09-01 ENCOUNTER — Telehealth: Payer: Self-pay | Admitting: Internal Medicine

## 2018-09-01 NOTE — Telephone Encounter (Signed)
Patient states she is feeling much much better yesterday and today---big improvement for systolic---had reached 200's over the weekend, today it is 150---patient has been asymptomatic yesterday and today--no dizziness, no headache, no shortness of breath---I have confirmed that she is taking increased dosage of Coreg and aspirin as discussed with dr Okey Dupre at 08/28/18 office visit--patient advised that dr Okey Dupre would prefer she eventually be at 120's systolic according to their office visit note---patient advised to call back at end of this week if her bp has not reached close to 120's systolic by Friday 09/05/18 or call back sooner if she develops symptoms again--if symptoms are critical, ok to go to [place]---can talk with tamara,RN at [facility] office if any further questions

## 2018-09-01 NOTE — Telephone Encounter (Signed)
Is she taking coreg 25 mg BID and aspirin 81 mg daily? If so when did she start this? When did all these changes start? If possible have triage done for possible stroke and if needed send to ER.

## 2018-09-01 NOTE — Telephone Encounter (Signed)
Can you triage patient please

## 2018-09-01 NOTE — Telephone Encounter (Signed)
Would you like patient to have an in office visit?

## 2018-09-01 NOTE — Telephone Encounter (Signed)
Agree 

## 2018-09-01 NOTE — Telephone Encounter (Signed)
[facility] 08/30/2018 at 11:55am  Patient called and spoke to the after hour call center stating that her BP was 161/136, pulse of 80, off balance, and a slight headache.  Patient was advised by [facility] to see PCP within 4 hours. Does not appear to have been seen [facility].   Please advise.

## 2018-10-13 ENCOUNTER — Other Ambulatory Visit: Payer: Self-pay | Admitting: Internal Medicine

## 2018-10-30 ENCOUNTER — Other Ambulatory Visit: Payer: Self-pay | Admitting: Internal Medicine

## 2018-11-07 ENCOUNTER — Other Ambulatory Visit: Payer: Self-pay | Admitting: Internal Medicine

## 2018-11-23 ENCOUNTER — Other Ambulatory Visit: Payer: Self-pay | Admitting: Internal Medicine

## 2018-12-08 ENCOUNTER — Telehealth: Payer: Self-pay | Admitting: Internal Medicine

## 2018-12-08 NOTE — Telephone Encounter (Signed)
Patient would like an in office appt for medication management.  Please advise.

## 2018-12-08 NOTE — Telephone Encounter (Signed)
As long as symptom free can make a visit  

## 2018-12-09 NOTE — Telephone Encounter (Signed)
Pt scheduled  

## 2018-12-10 ENCOUNTER — Ambulatory Visit: Payer: BLUE CROSS/BLUE SHIELD | Admitting: Internal Medicine

## 2018-12-10 ENCOUNTER — Ambulatory Visit (INDEPENDENT_AMBULATORY_CARE_PROVIDER_SITE_OTHER): Payer: BLUE CROSS/BLUE SHIELD | Admitting: Internal Medicine

## 2018-12-10 ENCOUNTER — Encounter: Payer: Self-pay | Admitting: Internal Medicine

## 2018-12-10 DIAGNOSIS — R05 Cough: Secondary | ICD-10-CM | POA: Diagnosis not present

## 2018-12-10 DIAGNOSIS — E669 Obesity, unspecified: Secondary | ICD-10-CM

## 2018-12-10 DIAGNOSIS — I10 Essential (primary) hypertension: Secondary | ICD-10-CM

## 2018-12-10 DIAGNOSIS — Z20822 Contact with and (suspected) exposure to covid-19: Secondary | ICD-10-CM

## 2018-12-10 DIAGNOSIS — E1169 Type 2 diabetes mellitus with other specified complication: Secondary | ICD-10-CM | POA: Diagnosis not present

## 2018-12-10 NOTE — Progress Notes (Signed)
Virtual Visit via Video Note  I connected with Latoya Cox on 12/10/18 at 11:00 AM EDT by a video enabled telemedicine application and verified that I am speaking with the correct person using two identifiers.  The patient and the provider were at separate locations throughout the entire encounter.   I discussed the limitations of evaluation and management by telemedicine and the availability of in person appointments. The patient expressed understanding and agreed to proceed.  History of Present Illness: The patient is a 53 y.o. female with visit for cough (started about 1 week ago, denies fevers or chills, having sinus pressure/drainage, denies headaches, denies body aches, no fevers and they check her daily for job, some mild SOB but she does not feel like this is affecting her life, overall not improving, she is taking some hydrocodone cough syrup she had leftover which she took last night, helped her sleep but she was tired and sleepy today) and follow up blood pressure (aking her coreg and denies checking blood pressure at home, denies headaches or feeling like it is high, previously she was feeling this way) and diabetes (her jardiance is now too costly to afford, she liked the way it was working, denies side effects with it, also taking actos, denies low sugars or high sugars feeling, not checking sugars).   Observations/Objective: Appearance: normal, some coughing during visit, non-productive, breathing appears normal, casual grooming, abdomen does not appear distended, throat some drainage, memory normal, mental status is A and O times 3, sleepy  Assessment and Plan: See problem oriented charting  Follow Up Instructions: covid-19 testing, mail savings card for jardiance, if she still cannot afford will need HgA1c prior to switch if covid-19 negative  I discussed the assessment and treatment plan with the patient. The patient was provided an opportunity to ask questions and all were  answered. The patient agreed with the plan and demonstrated an understanding of the instructions.   The patient was advised to call back or seek an in-person evaluation if the symptoms worsen or if the condition fails to improve as anticipated.  Hoyt Koch, MD

## 2018-12-11 DIAGNOSIS — Z20822 Contact with and (suspected) exposure to covid-19: Secondary | ICD-10-CM | POA: Insufficient documentation

## 2018-12-11 NOTE — Assessment & Plan Note (Signed)
Given covid-19 symptoms we cannot check labs today. Mailed jardiance savings card to see if she can afford. She is due for HgA1c and as soon as safe we will monitor this. Taking jardiance and actos without side effects currently.

## 2018-12-11 NOTE — Assessment & Plan Note (Signed)
Given new cough and sinus symptoms needs to be tested for covid-19. Advised to quarantine until results return and testing location and course of symptoms. Can continue using her hydrocodone cough syrup at night time.

## 2018-12-11 NOTE — Assessment & Plan Note (Signed)
Needs in person visit for BP check when able. Given cough she was not able to come in person today. Checking for covid-19 and if negative will need follow up. Given past stroke tight control of BP is very important and goal is <120/80. Taking coreg currently without symptoms of high BP but this does not mean she is at goal.

## 2018-12-22 ENCOUNTER — Ambulatory Visit: Payer: Self-pay | Admitting: *Deleted

## 2018-12-22 NOTE — Telephone Encounter (Signed)
Calling with left arm numbness that started yesterday and continues today. No other areas of numbness/no speech changes/no vision changes/no one sided weakness. Denies headaches. No fever No pain reported. No travels no known exposures to covid19.  History of a silent stroke in February 2020. Husband will take to [facility] ED at this time. Routing to provider.   Reason for Disposition . [1] Numbness (i.e., loss of sensation) of the face, arm / hand, or leg / foot on one side of the body AND [2] sudden onset AND [3] brief (now gone)  Answer Assessment - Initial Assessment Questions 1. SYMPTOM: "What is the main symptom you are concerned about?" (e.g., weakness, numbness)     Left arm and hand feels numb  2. ONSET: "When did this start?" (minutes, hours, days; while sleeping)     Yesterday 3. LAST NORMAL: "When was the last time you were normal (no symptoms)?"     Saturday 4. PATTERN "Does this come and go, or has it been constant since it started?"  "Is it present now?"     constant 5. CARDIAC SYMPTOMS: "Have you had any of the following symptoms: chest pain, difficulty breathing, palpitations?"      6. NEUROLOGIC SYMPTOMS: "Have you had any of the following symptoms: headache, dizziness, vision loss, double vision, changes in speech, unsteady on your feet?"     None of these. 7. OTHER SYMPTOMS: "Do you have any other symptoms?"     no 8. PREGNANCY: "Is there any chance you are pregnant?" "When was your last menstrual period?"    no  Protocols used: NEUROLOGIC DEFICIT-A-AH

## 2018-12-22 NOTE — Telephone Encounter (Signed)
Noted  

## 2019-01-14 ENCOUNTER — Encounter (HOSPITAL_COMMUNITY): Payer: Self-pay

## 2019-01-14 ENCOUNTER — Emergency Department (HOSPITAL_COMMUNITY): Payer: BLUE CROSS/BLUE SHIELD

## 2019-01-14 ENCOUNTER — Inpatient Hospital Stay (HOSPITAL_COMMUNITY)
Admission: EM | Admit: 2019-01-14 | Discharge: 2019-01-17 | DRG: 074 | Disposition: A | Discharge status: Home Health | Payer: BLUE CROSS/BLUE SHIELD | Attending: Internal Medicine | Admitting: Internal Medicine

## 2019-01-14 ENCOUNTER — Other Ambulatory Visit: Payer: Self-pay

## 2019-01-14 DIAGNOSIS — Z20828 Contact with and (suspected) exposure to other viral communicable diseases: Secondary | ICD-10-CM | POA: Diagnosis not present

## 2019-01-14 DIAGNOSIS — Z833 Family history of diabetes mellitus: Secondary | ICD-10-CM | POA: Diagnosis not present

## 2019-01-14 DIAGNOSIS — Z823 Family history of stroke: Secondary | ICD-10-CM | POA: Diagnosis not present

## 2019-01-14 DIAGNOSIS — E1165 Type 2 diabetes mellitus with hyperglycemia: Secondary | ICD-10-CM | POA: Diagnosis present

## 2019-01-14 DIAGNOSIS — Z79899 Other long term (current) drug therapy: Secondary | ICD-10-CM | POA: Diagnosis not present

## 2019-01-14 DIAGNOSIS — E1143 Type 2 diabetes mellitus with diabetic autonomic (poly)neuropathy: Secondary | ICD-10-CM | POA: Diagnosis present

## 2019-01-14 DIAGNOSIS — K3184 Gastroparesis: Secondary | ICD-10-CM | POA: Diagnosis not present

## 2019-01-14 DIAGNOSIS — E1169 Type 2 diabetes mellitus with other specified complication: Secondary | ICD-10-CM | POA: Diagnosis not present

## 2019-01-14 DIAGNOSIS — Z7984 Long term (current) use of oral hypoglycemic drugs: Secondary | ICD-10-CM

## 2019-01-14 DIAGNOSIS — E869 Volume depletion, unspecified: Secondary | ICD-10-CM | POA: Diagnosis not present

## 2019-01-14 DIAGNOSIS — Z9114 Patient's other noncompliance with medication regimen: Secondary | ICD-10-CM

## 2019-01-14 DIAGNOSIS — E669 Obesity, unspecified: Secondary | ICD-10-CM | POA: Diagnosis present

## 2019-01-14 DIAGNOSIS — Z8261 Family history of arthritis: Secondary | ICD-10-CM | POA: Diagnosis not present

## 2019-01-14 DIAGNOSIS — E876 Hypokalemia: Secondary | ICD-10-CM | POA: Diagnosis present

## 2019-01-14 DIAGNOSIS — Z885 Allergy status to narcotic agent status: Secondary | ICD-10-CM

## 2019-01-14 DIAGNOSIS — Z7951 Long term (current) use of inhaled steroids: Secondary | ICD-10-CM | POA: Diagnosis not present

## 2019-01-14 DIAGNOSIS — I10 Essential (primary) hypertension: Secondary | ICD-10-CM | POA: Diagnosis present

## 2019-01-14 DIAGNOSIS — E872 Acidosis, unspecified: Secondary | ICD-10-CM

## 2019-01-14 DIAGNOSIS — Z8249 Family history of ischemic heart disease and other diseases of the circulatory system: Secondary | ICD-10-CM | POA: Diagnosis not present

## 2019-01-14 DIAGNOSIS — R5381 Other malaise: Secondary | ICD-10-CM | POA: Diagnosis present

## 2019-01-14 DIAGNOSIS — Z9884 Bariatric surgery status: Secondary | ICD-10-CM | POA: Diagnosis not present

## 2019-01-14 DIAGNOSIS — R112 Nausea with vomiting, unspecified: Secondary | ICD-10-CM

## 2019-01-14 DIAGNOSIS — R9431 Abnormal electrocardiogram [ECG] [EKG]: Secondary | ICD-10-CM

## 2019-01-14 DIAGNOSIS — Z6832 Body mass index (BMI) 32.0-32.9, adult: Secondary | ICD-10-CM

## 2019-01-14 LAB — URINALYSIS, ROUTINE W REFLEX MICROSCOPIC
Bilirubin Urine: NEGATIVE
Glucose, UA: >=500 mg/dL — AB
Hgb urine dipstick: NEGATIVE
Leukocytes,Ua: NEGATIVE
Nitrite: NEGATIVE
Protein, ur: 30 mg/dL — AB
Specific Gravity, Urine: 1.015 (ref 1.005–1.030)
pH: 7.5 (ref 5.0–8.0)

## 2019-01-14 LAB — COMPREHENSIVE METABOLIC PANEL
ALT: 21 U/L (ref 0–44)
AST: 23 U/L (ref 15–41)
Albumin: 4.5 g/dL (ref 3.5–5.0)
Alkaline Phosphatase: 78 U/L (ref 38–126)
Anion gap: 14 (ref 5–15)
BUN: 15 mg/dL (ref 6–20)
CO2: 25 mmol/L (ref 22–32)
Calcium: 9.2 mg/dL (ref 8.9–10.3)
Chloride: 101 mmol/L (ref 98–111)
Creatinine, Ser: 0.77 mg/dL (ref 0.44–1.00)
GFR calc Af Amer: >60 mL/min (ref >60)
GFR calc non Af Amer: >60 mL/min (ref >60)
Glucose, Bld: 286 mg/dL — ABNORMAL HIGH (ref 70–99)
Potassium: 2.5 mmol/L — CL (ref 3.5–5.1)
Sodium: 140 mmol/L (ref 135–145)
Total Bilirubin: 1.3 mg/dL — ABNORMAL HIGH (ref 0.3–1.2)
Total Protein: 7.9 g/dL (ref 6.5–8.1)

## 2019-01-14 LAB — BASIC METABOLIC PANEL
Anion gap: 17 — ABNORMAL HIGH (ref 5–15)
BUN: 10 mg/dL (ref 6–20)
CO2: 23 mmol/L (ref 22–32)
Calcium: 8.9 mg/dL (ref 8.9–10.3)
Chloride: 102 mmol/L (ref 98–111)
Creatinine, Ser: 0.63 mg/dL (ref 0.44–1.00)
GFR calc Af Amer: >60 mL/min (ref >60)
GFR calc non Af Amer: >60 mL/min (ref >60)
Glucose, Bld: 208 mg/dL — ABNORMAL HIGH (ref 70–99)
Potassium: 2.6 mmol/L — CL (ref 3.5–5.1)
Sodium: 142 mmol/L (ref 135–145)

## 2019-01-14 LAB — LACTIC ACID, PLASMA
Lactic Acid, Venous: 2.1 mmol/L (ref 0.5–1.9)
Lactic Acid, Venous: 2.4 mmol/L (ref 0.5–1.9)
Lactic Acid, Venous: 2.3 mmol/L (ref 0.5–1.9)
Lactic Acid, Venous: 2.4 mmol/L (ref 0.5–1.9)

## 2019-01-14 LAB — CBC
HCT: 45.7 % (ref 36.0–46.0)
HCT: 45.3 % (ref 36.0–46.0)
Hemoglobin: 13.8 g/dL (ref 12.0–15.0)
Hemoglobin: 14.1 g/dL (ref 12.0–15.0)
MCH: 26.1 pg (ref 26.0–34.0)
MCH: 26.3 pg (ref 26.0–34.0)
MCHC: 30.2 g/dL (ref 30.0–36.0)
MCHC: 31.1 g/dL (ref 30.0–36.0)
MCV: 86.4 fL (ref 80.0–100.0)
MCV: 84.4 fL (ref 80.0–100.0)
Platelets: 192 10*3/uL (ref 150–400)
Platelets: 203 10*3/uL (ref 150–400)
RBC: 5.29 MIL/uL — ABNORMAL HIGH (ref 3.87–5.11)
RBC: 5.37 MIL/uL — ABNORMAL HIGH (ref 3.87–5.11)
RDW: 13.4 % (ref 11.5–15.5)
RDW: 13.5 % (ref 11.5–15.5)
WBC: 11.2 10*3/uL — ABNORMAL HIGH (ref 4.0–10.5)
WBC: 14 10*3/uL — ABNORMAL HIGH (ref 4.0–10.5)
nRBC: 0 % (ref 0.0–0.2)
nRBC: 0 % (ref 0.0–0.2)

## 2019-01-14 LAB — I-STAT BETA HCG BLOOD, ED (MC, WL, AP ONLY): I-stat hCG, quantitative: <5 m[IU]/mL (ref <5)

## 2019-01-14 LAB — CBG MONITORING, ED: Glucose-Capillary: 221 mg/dL — ABNORMAL HIGH (ref 70–99)

## 2019-01-14 LAB — MAGNESIUM
Magnesium: 2 mg/dL (ref 1.7–2.4)
Magnesium: 1.7 mg/dL (ref 1.7–2.4)

## 2019-01-14 LAB — APTT: aPTT: 26 seconds (ref 24–36)

## 2019-01-14 LAB — PROTIME-INR
INR: 1 (ref 0.8–1.2)
Prothrombin Time: 13.3 seconds (ref 11.4–15.2)

## 2019-01-14 LAB — URINALYSIS, MICROSCOPIC (REFLEX)
Bacteria, UA: NONE SEEN
RBC / HPF: NONE SEEN RBC/hpf (ref 0–5)
Squamous Epithelial / HPF: NONE SEEN (ref 0–5)
WBC, UA: NONE SEEN WBC/hpf (ref 0–5)

## 2019-01-14 LAB — SARS CORONAVIRUS 2 BY RT PCR (HOSPITAL ORDER, PERFORMED IN ~~LOC~~ HOSPITAL LAB): SARS Coronavirus 2: NEGATIVE

## 2019-01-14 LAB — LIPASE, BLOOD: Lipase: 42 U/L (ref 11–51)

## 2019-01-14 MED ORDER — POTASSIUM CHLORIDE CRYS ER 20 MEQ PO TBCR
40.0000 meq | EXTENDED_RELEASE_TABLET | Freq: Once | ORAL | Status: DC
  Filled 2019-01-14 (×2): qty 2

## 2019-01-14 MED ORDER — ADULT MULTIVITAMIN W/MINERALS CH
1.0000 | ORAL_TABLET | Freq: Every day | ORAL | Status: DC
  Administered 2019-01-15 – 2019-01-17 (×3): 1 via ORAL
  Filled 2019-01-14 (×3): qty 1

## 2019-01-14 MED ORDER — SODIUM CHLORIDE (PF) 0.9 % IJ SOLN
INTRAMUSCULAR | Status: AC
  Filled 2019-01-14: qty 50

## 2019-01-14 MED ORDER — HYDRALAZINE HCL 20 MG/ML IJ SOLN
10.0000 mg | Freq: Once | INTRAMUSCULAR | Status: AC
  Administered 2019-01-14: 10 mg via INTRAVENOUS
  Filled 2019-01-14: qty 1

## 2019-01-14 MED ORDER — ZOLPIDEM TARTRATE 5 MG PO TABS
5.0000 mg | ORAL_TABLET | Freq: Every evening | ORAL | Status: DC | PRN
  Administered 2019-01-15 – 2019-01-16 (×3): 5 mg via ORAL
  Filled 2019-01-14 (×3): qty 1

## 2019-01-14 MED ORDER — ONDANSETRON HCL 4 MG/2ML IJ SOLN
4.0000 mg | Freq: Four times a day (QID) | INTRAMUSCULAR | Status: DC | PRN
  Administered 2019-01-14 – 2019-01-17 (×2): 4 mg via INTRAVENOUS
  Filled 2019-01-14 (×2): qty 2

## 2019-01-14 MED ORDER — PROMETHAZINE HCL 25 MG/ML IJ SOLN
12.5000 mg | Freq: Once | INTRAMUSCULAR | Status: AC
  Administered 2019-01-14: 12.5 mg via INTRAVENOUS
  Filled 2019-01-14: qty 1

## 2019-01-14 MED ORDER — METOPROLOL TARTRATE 5 MG/5ML IV SOLN
2.5000 mg | Freq: Once | INTRAVENOUS | Status: AC
  Administered 2019-01-14: 22:00:00 2.5 mg via INTRAVENOUS
  Filled 2019-01-14: qty 5

## 2019-01-14 MED ORDER — METOPROLOL TARTRATE 5 MG/5ML IV SOLN
5.0000 mg | Freq: Four times a day (QID) | INTRAVENOUS | Status: DC
  Administered 2019-01-14 – 2019-01-16 (×8): 5 mg via INTRAVENOUS
  Filled 2019-01-14 (×8): qty 5

## 2019-01-14 MED ORDER — METOPROLOL TARTRATE 5 MG/5ML IV SOLN
5.0000 mg | Freq: Once | INTRAVENOUS | Status: AC
  Administered 2019-01-14: 5 mg via INTRAVENOUS
  Filled 2019-01-14: qty 5

## 2019-01-14 MED ORDER — IOHEXOL 300 MG/ML  SOLN
100.0000 mL | Freq: Once | INTRAMUSCULAR | Status: AC | PRN
  Administered 2019-01-14: 100 mL via INTRAVENOUS

## 2019-01-14 MED ORDER — VITAMIN C 250 MG PO TABS
125.0000 mg | ORAL_TABLET | Freq: Every day | ORAL | Status: DC
  Administered 2019-01-15 – 2019-01-17 (×3): 125 mg via ORAL
  Filled 2019-01-14 (×3): qty 1

## 2019-01-14 MED ORDER — FLUTICASONE PROPIONATE 50 MCG/ACT NA SUSP: 1.0000 | Freq: Every day | NASAL | Status: DC | PRN

## 2019-01-14 MED ORDER — ENOXAPARIN SODIUM 40 MG/0.4ML ~~LOC~~ SOLN
40.0000 mg | SUBCUTANEOUS | Status: DC
  Administered 2019-01-14 – 2019-01-16 (×3): 40 mg via SUBCUTANEOUS
  Filled 2019-01-14 (×3): qty 0.4

## 2019-01-14 MED ORDER — ONDANSETRON HCL 4 MG PO TABS: 4.0000 mg | ORAL_TABLET | Freq: Four times a day (QID) | ORAL | Status: DC | PRN

## 2019-01-14 MED ORDER — POTASSIUM CHLORIDE IN NACL 20-0.9 MEQ/L-% IV SOLN
INTRAVENOUS | Status: DC
  Administered 2019-01-14: 18:00:00 via INTRAVENOUS
  Filled 2019-01-14 (×2): qty 1000

## 2019-01-14 MED ORDER — ONDANSETRON HCL 4 MG/2ML IJ SOLN
4.0000 mg | Freq: Once | INTRAMUSCULAR | Status: AC
  Administered 2019-01-14: 4 mg via INTRAVENOUS
  Filled 2019-01-14: qty 2

## 2019-01-14 MED ORDER — POTASSIUM CHLORIDE 10 MEQ/100ML IV SOLN
10.0000 meq | INTRAVENOUS | Status: AC
  Administered 2019-01-14 (×4): 10 meq via INTRAVENOUS
  Filled 2019-01-14 (×4): qty 100

## 2019-01-14 MED ORDER — SODIUM CHLORIDE 0.9 % IV BOLUS (SEPSIS)
1000.0000 mL | Freq: Once | INTRAVENOUS | Status: AC
  Administered 2019-01-14: 1000 mL via INTRAVENOUS

## 2019-01-14 MED ORDER — POTASSIUM CHLORIDE 10 MEQ/100ML IV SOLN
10.0000 meq | INTRAVENOUS | Status: AC
  Administered 2019-01-14 – 2019-01-15 (×4): 10 meq via INTRAVENOUS
  Filled 2019-01-14 (×4): qty 100

## 2019-01-14 NOTE — ED Notes (Signed)
ED TO INPATIENT HANDOFF REPORT  Name/Age/Gender Latoya Cox 53 y.o. female  Code Status   Home/SNF/Other Home  Chief Complaint nausea emesis  Level of Care/Admitting Diagnosis ED Disposition    ED Disposition Condition [place]: [place] [[id number]]  Level of Care: Telemetry [5]  Admit to tele based on following criteria: Monitor QTC interval  Covid Evaluation: Asymptomatic Screening Protocol (No Symptoms)  Diagnosis: Intractable nausea and vomiting [[id number]]  Admitting Physician: Jonnie Finner [[id number]]  Attending Physician: Jonnie Finner [[id number]]  Estimated length of stay: past midnight tomorrow  Certification:: I certify this patient will need inpatient services for at least 2 midnights  PT Class (Do Not Modify): Inpatient [101]  PT Acc Code (Do Not Modify): Private [1]       Medical History Past Medical History:  Diagnosis Date  . Arthritis   . Chest wall pain   . Diabetes mellitus without complication ([place])   . Hypertension     Allergies Allergies  Allergen Reactions  . Hydrocodone Itching    Pt states "if i take 2, it makes me itch"    IV Location/Drains/Wounds Patient Lines/Drains/Airways Status   Active Line/Drains/Airways    Name:   Placement date:   Placement time:   Site:   Days:   Peripheral IV 01/14/19 Left Antecubital   01/14/19    0801    Antecubital   less than 1          Labs/Imaging Results for orders placed or performed during the hospital encounter of 01/14/19 (from the past 48 hour(s))  Lipase, blood     Status: None   Collection Time: 01/14/19  8:00 AM  Result Value Ref Range   Lipase 42 11 - 51 U/L    Comment: Performed at [facility], [place] [address], [place], [place] [address]  Comprehensive metabolic panel     Status: Abnormal   Collection Time: 01/14/19  8:00 AM  Result Value Ref Range   Sodium 140 135 - 145 mmol/L   Potassium 2.5 (LL) 3.5 - 5.1 mmol/L     Comment: CRITICAL RESULT CALLED TO, READ BACK BY AND VERIFIED WITH: ZULETA,C. RN AT 7846 01/14/19 MULLINS,T    Chloride 101 98 - 111 mmol/L   CO2 25 22 - 32 mmol/L   Glucose, Bld 286 (H) 70 - 99 mg/dL   BUN 15 6 - 20 mg/dL   Creatinine, Ser 0.77 0.44 - 1.00 mg/dL   Calcium 9.2 8.9 - 10.3 mg/dL   Total Protein 7.9 6.5 - 8.1 g/dL   Albumin 4.5 3.5 - 5.0 g/dL   AST 23 15 - 41 U/L   ALT 21 0 - 44 U/L   Alkaline Phosphatase 78 38 - 126 U/L   Total Bilirubin 1.3 (H) 0.3 - 1.2 mg/dL   GFR calc non Af Amer >60 >60 mL/min   GFR calc Af Amer >60 >60 mL/min   Anion gap 14 5 - 15    Comment: Performed at [facility], [place] [address], [place],  [address]  CBC     Status: Abnormal   Collection Time: 01/14/19  8:00 AM  Result Value Ref Range   WBC 11.2 (H) 4.0 - 10.5 K/uL   RBC 5.29 (H) 3.87 - 5.11 MIL/uL   Hemoglobin 13.8 12.0 - 15.0 g/dL   HCT 45.7 36.0 - 46.0 %   MCV 86.4 80.0 - 100.0 fL   MCH 26.1 26.0 -  34.0 pg   MCHC 30.2 30.0 - 36.0 g/dL   RDW 65.7 90.3 - 83.3 %   Platelets 192 150 - 400 K/uL   nRBC 0.0 0.0 - 0.2 %    Comment: Performed at [facility], [address], [place], [place] [address]  Magnesium     Status: None   Collection Time: 01/14/19  8:00 AM  Result Value Ref Range   Magnesium 2.0 1.7 - 2.4 mg/dL    Comment: Performed at [facility], [address], [place], [place] [address]  Urinalysis, Routine w reflex microscopic     Status: Abnormal   Collection Time: 01/14/19  8:13 AM  Result Value Ref Range   Color, Urine YELLOW YELLOW   APPearance CLEAR CLEAR   Specific Gravity, Urine 1.015 1.005 - 1.030   pH 7.5 5.0 - 8.0   Glucose, UA >=500 (A) NEGATIVE mg/dL   Hgb urine dipstick NEGATIVE NEGATIVE   Bilirubin Urine NEGATIVE NEGATIVE   Ketones, ur TRACE (A) NEGATIVE mg/dL   Protein, ur 30 (A) NEGATIVE mg/dL   Nitrite NEGATIVE NEGATIVE   Leukocytes,Ua NEGATIVE NEGATIVE    Comment: Performed at [facility], [address], [place], [place] [address]  Lactic acid, plasma     Status: Abnormal   Collection Time: 01/14/19  8:13 AM  Result Value Ref Range   Lactic Acid, Venous 2.1 (HH) 0.5 - 1.9 mmol/L    Comment: CRITICAL RESULT CALLED TO, READ BACK BY AND VERIFIED WITHNelda Marseille RN AT [phone] 01/14/19 MULLINS,T Performed at [facility], [address] [place], [place] [address]   APTT     Status: None   Collection Time: 01/14/19  8:13 AM  Result Value Ref Range   aPTT 26 24 - 36 seconds    Comment: Performed at [facility], [address], [place], [place] [address]  Protime-INR     Status: None   Collection Time: 01/14/19  8:13 AM  Result Value Ref Range   Prothrombin Time 13.3 11.4 - 15.2 seconds   INR 1.0 0.8 - 1.2    Comment: (NOTE) INR goal varies based on device and disease states. Performed at [facility], [address] [place], [place] [address]   Blood Culture (routine x 2)     Status: None (Preliminary result)   Collection Time: 01/14/19  8:13 AM   Specimen: BLOOD RIGHT ARM  Result Value Ref Range   Specimen Description BLOOD RIGHT ARM    Special Requests      BOTTLES DRAWN AEROBIC AND ANAEROBIC Blood Culture adequate volume Performed at [facility], [address] [place], [place] [address]    Culture PENDING    Report Status PENDING   Urinalysis, Microscopic (reflex)     Status: None   Collection Time: 01/14/19  8:13 AM  Result Value Ref Range   RBC / HPF NONE SEEN 0 - 5 RBC/hpf   WBC, UA NONE SEEN 0 - 5 WBC/hpf   Bacteria, UA NONE SEEN NONE SEEN   Squamous Epithelial / LPF NONE SEEN 0 - 5    Comment: Performed at [facility], [address], [place], [place] [address]  I-Stat beta hCG blood, ED     Status: None   Collection Time: 01/14/19  8:34 AM  Result Value Ref Range   I-stat hCG, quantitative <5.0 <5 mIU/mL   Comment 3  Comment:    GEST. AGE      CONC.  (mIU/mL)   <=1 WEEK        5 - 50     2 WEEKS       50 - 500     3 WEEKS       100 - 10,000     4 WEEKS     1,000 - 30,000        FEMALE AND NON-PREGNANT FEMALE:     LESS THAN 5 mIU/mL   POC CBG, ED     Status: Abnormal   Collection Time: 01/14/19  9:42 AM  Result Value Ref Range   Glucose-Capillary 221 (H) 70 - 99 mg/dL  Lactic acid, plasma     Status: Abnormal   Collection Time: 01/14/19 10:13 AM  Result Value Ref Range   Lactic Acid, Venous 2.4 (HH) 0.5 - 1.9 mmol/L    Comment: CRITICAL VALUE NOTED.  VALUE IS CONSISTENT WITH PREVIOUSLY REPORTED AND CALLED VALUE. Performed at [facility], [address] [place], [place] [id number]   SARS Coronavirus 2 [facility](Hospital order, Performed in [facility] hospital lab) Nasopharyngeal Nasopharyngeal Swab     Status: None   Collection Time: 01/14/19  1:55 PM   Specimen: Nasopharyngeal Swab  Result Value Ref Range   SARS Coronavirus 2 NEGATIVE NEGATIVE    Comment: (NOTE) If result is NEGATIVE SARS-CoV-2 target nucleic acids are NOT DETECTED. The SARS-CoV-2 RNA is generally detectable in upper and lower  respiratory specimens during the acute phase of infection. The lowest  concentration of SARS-CoV-2 viral copies this assay can detect is 250  copies / mL. A negative result does not preclude SARS-CoV-2 infection  and should not be used as the sole basis for treatment or other  patient management decisions.  A negative result may occur with  improper specimen collection / handling, submission of specimen other  than nasopharyngeal swab, presence of viral mutation(s) within the  areas targeted by this assay, and inadequate number of viral copies  (<250 copies / mL). A negative result must be combined with clinical  observations, patient history, and epidemiological information. If result is POSITIVE SARS-CoV-2 target nucleic acids are DETECTED. The SARS-CoV-2 RNA is generally detectable in upper and  lower  respiratory specimens dur ing the acute phase of infection.  Positive  results are indicative of active infection with SARS-CoV-2.  Clinical  correlation with patient history and other diagnostic information is  necessary to determine patient infection status.  Positive results do  not rule out bacterial infection or co-infection with other viruses. If result is PRESUMPTIVE POSTIVE SARS-CoV-2 nucleic acids MAY BE PRESENT.   A presumptive positive result was obtained on the submitted specimen  and confirmed on repeat testing.  While 2019 novel coronavirus  (SARS-CoV-2) nucleic acids may be present in the submitted sample  additional confirmatory testing may be necessary for epidemiological  and / or clinical management purposes  to differentiate between  SARS-CoV-2 and other Sarbecovirus currently known to infect humans.  If clinically indicated additional testing with an alternate test  methodology [phone]([id number]) is advised. The SARS-CoV-2 RNA is generally  detectable in upper and lower respiratory sp ecimens during the acute  phase of infection. The expected result is Negative. Fact Sheet for Patients:  BoilerBrush.com.cyhttps://www.fda.gov/media/136312/download Fact Sheet for Healthcare Providers: https://pope.com/https://www.fda.gov/media/136313/download This test is not yet approved or cleared by the [place] FDA and has been authorized for detection and/or diagnosis of SARS-CoV-2 by FDA  under an Emergency Use Authorization (EUA).  This EUA will remain in effect (meaning this test can be used) for the duration of the COVID-19 declaration under Section 564(b)(1) of the Act, 21 U.S.C. section 360bbb-3(b)(1), unless the authorization is terminated or revoked sooner. Performed at [facility], [address] [place], [place] [address]    Ct Abdomen Pelvis W Contrast  Result Date: 01/14/2019 CLINICAL DATA:  Abdominal distension. EXAM: CT ABDOMEN AND PELVIS WITH CONTRAST TECHNIQUE:  Multidetector CT imaging of the abdomen and pelvis was performed using the standard protocol following bolus administration of intravenous contrast. CONTRAST:  OMNIPAQUE IOHEXOL 300 MG/ML  SOLN COMPARISON:  CT scan of July 21, 2017. FINDINGS: Lower chest: No acute abnormality. Hepatobiliary: No focal liver abnormality is seen. No gallstones, gallbladder wall thickening, or biliary dilatation. Pancreas: Unremarkable. No pancreatic ductal dilatation or surrounding inflammatory changes. Spleen: Normal in size without focal abnormality. Adrenals/Urinary Tract: Adrenal glands are unremarkable. Kidneys are normal, without renal calculi, focal lesion, or hydronephrosis. Mild urinary bladder distention is noted. Stomach/Bowel: Status post gastric surgery. There is no evidence of bowel obstruction or inflammation. The appendix is not visualized. Vascular/Lymphatic: Aortic atherosclerosis. No enlarged abdominal or pelvic lymph nodes. Reproductive: Uterus and bilateral adnexa are unremarkable. Other: No abdominal wall hernia or abnormality. No abdominopelvic ascites. Musculoskeletal: No acute or significant osseous findings. IMPRESSION: Mild urinary bladder distention is noted. No other acute abnormality seen in the abdomen or pelvis. Aortic Atherosclerosis (ICD10-I70.0). Electronically Signed   By: Lupita Raider M.D.   On: 01/14/2019 13:09   Dg Abdomen Acute W/chest  Result Date: 01/14/2019 CLINICAL DATA:  Vomiting, nausea. EXAM: DG ABDOMEN ACUTE W/ 1V CHEST COMPARISON:  Radiographs of May 09, 2015. FINDINGS: There is no evidence of dilated bowel loops or free intraperitoneal air. Postsurgical changes are noted in the left upper quadrant. No radiopaque calculi or other significant radiographic abnormality is seen. Heart size and mediastinal contours are within normal limits. Both lungs are clear. IMPRESSION: No definite evidence of bowel obstruction or ileus. No acute cardiopulmonary disease. Electronically  Signed   By: Lupita Raider M.D.   On: 01/14/2019 09:43    Pending Labs Unresulted Labs (From admission, onward)    Start     Ordered   01/14/19 0813  Blood Culture (routine x 2)  BLOOD CULTURE X 2,   STAT     01/14/19 0813   01/14/19 0813  Urine culture  ONCE - STAT,   STAT     01/14/19 0813   Signed and Held  HIV antibody (Routine Testing)  Once,   R     Signed and Held   Signed and Held  CBC  (enoxaparin (LOVENOX)    CrCl >/= 30 ml/min)  Once,   R    Comments: Baseline for enoxaparin therapy IF NOT ALREADY DRAWN.  Notify MD if PLT < 100 K.    Signed and Held   Signed and Held  Creatinine, serum  (enoxaparin (LOVENOX)    CrCl >/= 30 ml/min)  Once,   R    Comments: Baseline for enoxaparin therapy IF NOT ALREADY DRAWN.    Signed and Held   Signed and Held  Creatinine, serum  (enoxaparin (LOVENOX)    CrCl >/= 30 ml/min)  Weekly,   R    Comments: while on enoxaparin therapy    Signed and Held   Signed and Held  Hemoglobin A1c  Once,   R     Signed  and Held   Signed and Held  Comprehensive metabolic panel  Tomorrow morning,   R     Signed and Held   Signed and Held  CBC  Tomorrow morning,   R     Signed and Held   Signed and Held  Basic metabolic panel  Once,   R     Signed and Held   Signed and Held  Magnesium  Once,   R     Signed and Held   Signed and Held  Lactic acid, plasma  STAT Now then every 3 hours,   R     Signed and Held          Vitals/Pain Today's Vitals   01/14/19 1430 01/14/19 1530 01/14/19 1600 01/14/19 1700  BP: (!) 174/104 (!) 166/102 (!) 174/107 (!) 169/108  Pulse: (!) 125 (!) 123 (!) 122 (!) 124  Resp: (!) 21 19 10 17   Temp:      TempSrc:      SpO2: 99% 98% 98% 100%  Weight:      Height:      PainSc:        Isolation Precautions No active isolations  Medications Medications  potassium chloride SA (K-DUR) CR tablet 40 mEq (0 mEq Oral Hold 01/14/19 1026)  sodium chloride (PF) 0.9 % injection (has no administration in time range)   ondansetron (ZOFRAN) injection 4 mg (4 mg Intravenous Given 01/14/19 0839)  sodium chloride 0.9 % bolus 1,000 mL (0 mLs Intravenous Stopped 01/14/19 1120)  potassium chloride 10 mEq in 100 mL IVPB (0 mEq Intravenous Stopped 01/14/19 1655)  promethazine (PHENERGAN) injection 12.5 mg (12.5 mg Intravenous Given 01/14/19 1017)  iohexol (OMNIPAQUE) 300 MG/ML solution 100 mL (100 mLs Intravenous Contrast Given 01/14/19 1231)  sodium chloride 0.9 % bolus 1,000 mL (0 mLs Intravenous Stopped 01/14/19 1704)  metoprolol tartrate (LOPRESSOR) injection 5 mg (5 mg Intravenous Given 01/14/19 1252)  hydrALAZINE (APRESOLINE) injection 10 mg (10 mg Intravenous Given 01/14/19 1351)    Mobility walks

## 2019-01-14 NOTE — ED Provider Notes (Addendum)
[facility]-EMERGENCY DEPT Provider Note   CSN: [id number] Arrival date & time: 01/14/19  0109     History   Chief Complaint Chief Complaint  Patient presents with  . Nausea  . Emesis    HPI Rondia Charley is a 53 y.o. female.     Patient is a 53 year old female with past medical history of diabetes, hypertension, arthritis presenting to the emergency department for vomiting and dizziness.  Patient reports that this began all of a sudden at 4 AM.  Denies any history of the same in the past.  She reports that she felt dizzy like the room is spinning when she gets up and walks.  She supposedly had bilious vomiting.  Was given Zofran by EMS.  Still feeling nauseated and actively vomiting.  Denies any dysuria, diarrhea, hematuria, fever, chills, chest pain, shortness of breath, abdominal pain.  She admits to being noncompliant with her medications.     Past Medical History:  Diagnosis Date  . Arthritis   . Chest wall pain   . Diabetes mellitus without complication (HCC)   . Hypertension     Patient Active Problem List   Diagnosis Date Noted  . Suspected Covid-19 Virus Infection 12/11/2018  . Hx of completed stroke 08/28/2018  . Hypokalemia 10/18/2017  . Overweight (BMI 25.0-29.9) 05/14/2017  . Routine general medical examination at a health care facility 08/02/2016  . Neck fullness 01/27/2016  . Diabetes mellitus type 2 in obese (HCC) 05/17/2015  . Essential hypertension 05/17/2015  . Vitamin D deficiency 05/17/2015    Past Surgical History:  Procedure Laterality Date  . aneurism repair    . lapband       OB History    Gravida  3   Para      Term      Preterm      AB  3   Living        SAB  3   TAB      Ectopic      Multiple      Live Births               Home Medications    Prior to Admission medications   Medication Sig Start Date End Date Taking? Authorizing Provider  Ascorbic Acid (VITAMIN C) 100 MG tablet Take  100 mg by mouth daily.   Yes [provider]  carvedilol (COREG) 25 MG tablet Take 1 tablet (25 mg total) by mouth 2 (two) times daily with a meal. 08/28/18  Yes Myrlene Broker, MD  diclofenac (VOLTAREN) 75 MG EC tablet TAKE 1 TABLET BY MOUTH TWICE A DAY Patient taking differently: Take 75 mg by mouth 2 (two) times daily.  10/30/18  Yes Myrlene Broker, MD  Diethylpropion HCl 25 MG TABS Take 1 tablet by mouth every evening.   Yes [provider]  Doxylamine Succinate, Sleep, (SLEEP AID PO) Take 1 tablet by mouth at bedtime.   Yes [provider]  fluconazole (DIFLUCAN) 150 MG tablet Take 1 tablet (150 mg total) by mouth every 3 (three) days. Patient taking differently: Take 150 mg by mouth every 3 (three) days. As needed if symptoms have not improved. 08/28/18  Yes Myrlene Broker, MD  fluticasone [facility]) 50 MCG/ACT nasal spray Place 1 spray daily as needed into both nostrils for allergies. 03/04/17  Yes Myrlene Broker, MD  JARDIANCE 25 MG TABS tablet TAKE 1 TABLET BY MOUTH EVERY DAY Patient taking differently: Take  25 mg by mouth daily.  11/24/18  Yes Hoyt Koch, MD  Multiple Vitamins-Minerals (MULTIVITAMIN ADULT) TABS Take 1 tablet by mouth daily.   Yes [provider]  nystatin-triamcinolone ointment (MYCOLOG) APPLY TO AFFECTED AREA TWICE DAILY Patient taking differently: Apply 1 application topically 2 (two) times daily.  01/29/17  Yes Hoyt Koch, MD  omeprazole (PRILOSEC) 40 MG capsule TAKE 1 CAPSULE BY MOUTH EVERY DAY Patient taking differently: Take 40 mg by mouth daily.  10/13/18  Yes Hoyt Koch, MD  PHENDIMETRAZINE TARTRATE PO Take 105 mg by mouth daily.   Yes [provider]  pioglitazone (ACTOS) 30 MG tablet TAKE 1 TABLET BY MOUTH EVERY DAY Patient taking differently: Take 30 mg by mouth daily.  11/07/18  Yes Hoyt Koch, MD  potassium chloride SA (K-DUR,KLOR-CON) 20 MEQ tablet  Take 3 tablets (60 mEq total) by mouth daily. 11/05/17  Yes Hoyt Koch, MD  CLEVER CHEK LANCETS MISC Use daily to check sugars. 08/30/15   Hoyt Koch, MD  glucose blood (CLEVER CHOICE MICRO TEST) test strip Use as instructed 08/30/15   Hoyt Koch, MD  glucose blood (ONETOUCH VERIO) test strip 1 each by Other route 2 (two) times daily. Use to check blood sugars twice a day Dx E11.9 01/04/16   Hoyt Koch, MD  [facility] DELICA LANCETS 25K MISC Use to help check blood sugars twice a day Dx E11.9 01/04/16   Hoyt Koch, MD    Family History Family History  Problem Relation Age of Onset  . Stroke Mother   . Hypertension Mother   . Arthritis Mother   . Diabetes Mother   . Arthritis Father   . Hypertension Father   . Diabetes Father     Social History Social History   Tobacco Use  . Smoking status: Never Smoker  . Smokeless tobacco: Never Used  Substance Use Topics  . Alcohol use: No  . Drug use: No     Allergies   Hydrocodone   Review of Systems Review of Systems  Constitutional: Negative for appetite change, chills and fever.  HENT: Negative for ear pain and sore throat.   Eyes: Negative for pain and visual disturbance.  Respiratory: Negative for cough, chest tightness, shortness of breath and wheezing.   Cardiovascular: Negative for chest pain and palpitations.  Gastrointestinal: Positive for nausea and vomiting. Negative for abdominal pain.  Genitourinary: Negative for dysuria and hematuria.  Musculoskeletal: Negative for arthralgias and back pain.  Skin: Negative for color change and rash.  Neurological: Negative for seizures, syncope, weakness, light-headedness and headaches.  All other systems reviewed and are negative.    Physical Exam Updated Vital Signs BP (!) 182/114   Pulse 91   Temp 98.5 F (36.9 C) (Rectal)   Resp (!) 21   Ht 5\' 2"  (1.575 m)   Wt 80.3 kg   SpO2 97%   BMI 32.37 kg/m   Physical Exam    ED Treatments / Results  Labs (all labs ordered are listed, but only abnormal results are displayed) Labs Reviewed  COMPREHENSIVE METABOLIC PANEL - Abnormal; Notable for the following components:      Result Value   Potassium 2.5 (*)    Glucose, Bld 286 (*)    Total Bilirubin 1.3 (*)    All other components within normal limits  CBC - Abnormal; Notable for the following components:   WBC 11.2 (*)    RBC 5.29 (*)    All  other components within normal limits  URINALYSIS, ROUTINE W REFLEX MICROSCOPIC - Abnormal; Notable for the following components:   Glucose, UA >=500 (*)    Ketones, ur TRACE (*)    Protein, ur 30 (*)    All other components within normal limits  LACTIC ACID, PLASMA - Abnormal; Notable for the following components:   Lactic Acid, Venous 2.1 (*)    All other components within normal limits  LACTIC ACID, PLASMA - Abnormal; Notable for the following components:   Lactic Acid, Venous 2.4 (*)    All other components within normal limits  CBG MONITORING, ED - Abnormal; Notable for the following components:   Glucose-Capillary 221 (*)    All other components within normal limits  CULTURE, BLOOD (ROUTINE X 2)  CULTURE, BLOOD (ROUTINE X 2)  URINE CULTURE  SARS CORONAVIRUS 2 (HOSPITAL ORDER, PERFORMED IN [facility])  LIPASE, BLOOD  APTT  PROTIME-INR  URINALYSIS, MICROSCOPIC (REFLEX)  MAGNESIUM  I-STAT BETA HCG BLOOD, ED ([facility], [facility], [facility] ONLY)    EKG EKG Interpretation  Date/Time:  Wednesday January 14 2019 08:18:20 EDT Ventricular Rate:  86 PR Interval:    QRS Duration: 116 QT Interval:  476 QTC Calculation: 570 R Axis:   0 Text Interpretation:  Sinus rhythm Prolonged PR interval Biatrial enlargement LVH with secondary repolarization abnormality Anterior Q waves, possibly due to LVH Prolonged QT interval Prolonged QT worsened since previous  Confirmed by Richardean CanalYao, David H [phone]([id number]) on 01/14/2019 10:05:28 AM   Radiology Ct Abdomen Pelvis W Contrast   Result Date: 01/14/2019 CLINICAL DATA:  Abdominal distension. EXAM: CT ABDOMEN AND PELVIS WITH CONTRAST TECHNIQUE: Multidetector CT imaging of the abdomen and pelvis was performed using the standard protocol following bolus administration of intravenous contrast. CONTRAST:  100mL OMNIPAQUE IOHEXOL 300 MG/ML  SOLN COMPARISON:  CT scan of July 21, 2017. FINDINGS: Lower chest: No acute abnormality. Hepatobiliary: No focal liver abnormality is seen. No gallstones, gallbladder wall thickening, or biliary dilatation. Pancreas: Unremarkable. No pancreatic ductal dilatation or surrounding inflammatory changes. Spleen: Normal in size without focal abnormality. Adrenals/Urinary Tract: Adrenal glands are unremarkable. Kidneys are normal, without renal calculi, focal lesion, or hydronephrosis. Mild urinary bladder distention is noted. Stomach/Bowel: Status post gastric surgery. There is no evidence of bowel obstruction or inflammation. The appendix is not visualized. Vascular/Lymphatic: Aortic atherosclerosis. No enlarged abdominal or pelvic lymph nodes. Reproductive: Uterus and bilateral adnexa are unremarkable. Other: No abdominal wall hernia or abnormality. No abdominopelvic ascites. Musculoskeletal: No acute or significant osseous findings. IMPRESSION: Mild urinary bladder distention is noted. No other acute abnormality seen in the abdomen or pelvis. Aortic Atherosclerosis (ICD10-I70.0). Electronically Signed   By: Lupita RaiderJames  Green Jr M.D.   On: 01/14/2019 13:09   Dg Abdomen Acute W/chest  Result Date: 01/14/2019 CLINICAL DATA:  Vomiting, nausea. EXAM: DG ABDOMEN ACUTE W/ 1V CHEST COMPARISON:  Radiographs of May 09, 2015. FINDINGS: There is no evidence of dilated bowel loops or free intraperitoneal air. Postsurgical changes are noted in the left upper quadrant. No radiopaque calculi or other significant radiographic abnormality is seen. Heart size and mediastinal contours are within normal limits. Both lungs are clear.  IMPRESSION: No definite evidence of bowel obstruction or ileus. No acute cardiopulmonary disease. Electronically Signed   By: Lupita RaiderJames  Green Jr M.D.   On: 01/14/2019 09:43    Procedures Procedures (including critical care time)  Medications Ordered in ED Medications  potassium chloride SA (K-DUR) CR tablet 40 mEq (0 mEq Oral Hold 01/14/19 1026)  potassium chloride 10 mEq in 100 mL IVPB (10 mEq Intravenous New Bag/Given 01/14/19 1425)  sodium chloride (PF) 0.9 % injection (has no administration in time range)  ondansetron (ZOFRAN) injection 4 mg (4 mg Intravenous Given 01/14/19 0839)  sodium chloride 0.9 % bolus 1,000 mL (0 mLs Intravenous Stopped 01/14/19 1120)  promethazine (PHENERGAN) injection 12.5 mg (12.5 mg Intravenous Given 01/14/19 1017)  iohexol (OMNIPAQUE) 300 MG/ML solution 100 mL (100 mLs Intravenous Contrast Given 01/14/19 1231)  sodium chloride 0.9 % bolus 1,000 mL (1,000 mLs Intravenous New Bag/Given 01/14/19 1217)  metoprolol tartrate (LOPRESSOR) injection 5 mg (5 mg Intravenous Given 01/14/19 1252)  hydrALAZINE (APRESOLINE) injection 10 mg (10 mg Intravenous Given 01/14/19 1351)     Initial Impression / Assessment and Plan / ED Course  I have reviewed the triage vital signs and the nursing notes.  Pertinent labs & imaging results that were available during my care of the patient were reviewed by me and considered in my medical decision making (see chart for details).  Clinical Course as of Jan 14 1440  Wed Jan 14, 2019  [id number] 53 year old female presenting with nausea vomiting, abdominal pain and dizziness since 4 AM this morning.  Bilious vomiting on exam.  Potassium is 2.5 with a prolonged QT was worsened from previous.  Patient given oral and IV potassium and fluids.  White count is 11.2 and mildly elevated bilirubin at 1.3.  Will obtain CT scan of her belly.   [KM]  1329 Patient continues to have n/v. Ct negative. Consulting hospitalist for admission.    [KM]  1337 Patient  accepted to be admitted by Dr. Ronaldo MiyamotoKyle for severe hypokalemia and intractable n/v. No etiology for vomiting. Covid test pending.    [KM]    Clinical Course User Index [KM] Arlyn DunningMcLean, Khairi Garman A, PA-C        Final Clinical Impressions(s) / ED Diagnoses   Final diagnoses:  Intractable vomiting with nausea, unspecified vomiting type  Hypokalemia    ED Discharge Orders    None       Arlyn DunningMcLean, Peyten Weare A, PA-C 01/14/19 1404    Arlyn DunningMcLean, Jannie Doyle A, PA-C 01/14/19 1441    Charlynne PanderYao, David Hsienta, MD 01/18/19 2106

## 2019-01-14 NOTE — ED Notes (Addendum)
Patient refused to be transported to CT at this time. Pt states "I feel too sick, come back later".

## 2019-01-14 NOTE — H&P (Signed)
.  History and Physical    Latoya Cox GMW:[id number] DOB: 03/13/1966 DOA: 01/14/2019  PCP: Myrlene Brokerrawford, Elizabeth A, MD  Patient coming from: Home   Chief Complaint: Nausea, vomitting  HPI: Latoya Nottinghamureka Stricker is a 53 y.o. female with medical history significant of DM2, gastric sleeve. Presents with nausea starting at 4am. She reports no heralding symptoms. She just started vomitting and hasn't been able to stop. She denies any sick contacts. She denies any aggravating or alleviating factors. She denies any recent illness. She became concerned when she couldn't control the vomitting and came to the ED.     ED Course: Evaluated by ED. Found to be hypokalemic, hyperglycemic, and hypertensive. Imaging was negative. TR[facility] was called for admission.   Review of Systems: Denies CP, dyspnea, HA, ab pain. Reports N, V, some dizziness when standing. Remainder of 10 point review of systems is otherwise negative for all not mentioned in HPI.    Past Medical History:  Diagnosis Date  . Arthritis   . Chest wall pain   . Diabetes mellitus without complication (HCC)   . Hypertension     Past Surgical History:  Procedure Laterality Date  . aneurism repair    . lapband       reports that she has never smoked. She has never used smokeless tobacco. She reports that she does not drink alcohol or use drugs.  Allergies  Allergen Reactions  . Hydrocodone Itching    Pt states "if i take 2, it makes me itch"    Family History  Problem Relation Age of Onset  . Stroke Mother   . Hypertension Mother   . Arthritis Mother   . Diabetes Mother   . Arthritis Father   . Hypertension Father   . Diabetes Father     Prior to Admission medications   Medication Sig Start Date End Date Taking? Authorizing Provider  Ascorbic Acid (VITAMIN C) 100 MG tablet Take 100 mg by mouth daily.   Yes [provider]  carvedilol (COREG) 25 MG tablet Take 1 tablet (25 mg total) by mouth 2 (two) times daily with a meal.  08/28/18  Yes Myrlene Brokerrawford, Elizabeth A, MD  diclofenac (VOLTAREN) 75 MG EC tablet TAKE 1 TABLET BY MOUTH TWICE A DAY Patient taking differently: Take 75 mg by mouth 2 (two) times daily.  10/30/18  Yes Myrlene Brokerrawford, Elizabeth A, MD  Diethylpropion HCl 25 MG TABS Take 1 tablet by mouth every evening.   Yes [provider]  Doxylamine Succinate, Sleep, (SLEEP AID PO) Take 1 tablet by mouth at bedtime.   Yes [provider]  fluconazole (DIFLUCAN) 150 MG tablet Take 1 tablet (150 mg total) by mouth every 3 (three) days. Patient taking differently: Take 150 mg by mouth every 3 (three) days. As needed if symptoms have not improved. 08/28/18  Yes Myrlene Brokerrawford, Elizabeth A, MD  fluticasone [facility](FLONASE) 50 MCG/ACT nasal spray Place 1 spray daily as needed into both nostrils for allergies. 03/04/17  Yes Myrlene Brokerrawford, Elizabeth A, MD  JARDIANCE 25 MG TABS tablet TAKE 1 TABLET BY MOUTH EVERY DAY Patient taking differently: Take 25 mg by mouth daily.  11/24/18  Yes Myrlene Brokerrawford, Elizabeth A, MD  Multiple Vitamins-Minerals (MULTIVITAMIN ADULT) TABS Take 1 tablet by mouth daily.   Yes [provider]  nystatin-triamcinolone ointment (MYCOLOG) APPLY TO AFFECTED AREA TWICE DAILY Patient taking differently: Apply 1 application topically 2 (two) times daily.  01/29/17  Yes Myrlene Brokerrawford, Elizabeth A, MD  omeprazole (PRILOSEC) 40 MG capsule TAKE  [place] DAY Patient taking differently: Take 40 mg by mouth daily.  10/13/18  Yes Hoyt Koch, MD  PHENDIMETRAZINE TARTRATE PO Take 105 mg by mouth daily.   Yes [provider]  pioglitazone (ACTOS) 30 MG tablet TAKE 1 TABLET BY MOUTH EVERY DAY Patient taking differently: Take 30 mg by mouth daily.  11/07/18  Yes Hoyt Koch, MD  potassium chloride SA (K-DUR,KLOR-CON) 20 MEQ tablet Take 3 tablets (60 mEq total) by mouth daily. 11/05/17  Yes Hoyt Koch, MD  CLEVER CHEK LANCETS MISC Use daily to check sugars. 08/30/15   Hoyt Koch, MD  glucose blood (CLEVER CHOICE MICRO TEST) test strip Use as instructed 08/30/15   Hoyt Koch, MD  glucose blood (ONETOUCH VERIO) test strip 1 each by Other route 2 (two) times daily. Use to check blood sugars twice a day Dx E11.9 01/04/16   Hoyt Koch, MD  [facility] DELICA LANCETS 51O MISC Use to help check blood sugars twice a day Dx E11.9 01/04/16   Hoyt Koch, MD    Physical Exam: Vitals:   01/14/19 1349 01/14/19 1400 01/14/19 1430 01/14/19 1530  BP:  (!) 180/111 (!) 174/104 (!) 166/102  Pulse:  98 (!) 125 (!) 123  Resp:  16 (!) 21 19  Temp: 98.5 F (36.9 C)     TempSrc: Rectal     SpO2:  98% 99% 98%  Weight:      Height:        Constitutional: 53 y.o. female NAD, calm, comfortable Vitals:   01/14/19 1349 01/14/19 1400 01/14/19 1430 01/14/19 1530  BP:  (!) 180/111 (!) 174/104 (!) 166/102  Pulse:  98 (!) 125 (!) 123  Resp:  16 (!) 21 19  Temp: 98.5 F (36.9 C)     TempSrc: Rectal     SpO2:  98% 99% 98%  Weight:      Height:       Eyes: PERRL, lids and conjunctivae normal ENMT: Mucous membranes are moist. Posterior pharynx clear of any exudate or lesions.Normal dentition.  Neck: normal, supple, no masses, no thyromegaly Respiratory: clear to auscultation bilaterally, no wheezing, no crackles. Normal respiratory effort. No accessory muscle use.  Cardiovascular: Regular rate and rhythm, no murmurs / rubs / gallops. No extremity edema. 2+ pedal pulses. No carotid bruits.  Abdomen: no tenderness, no masses palpated. No hepatosplenomegaly. Bowel sounds positive.  Musculoskeletal: no clubbing / cyanosis. No joint deformity upper and lower extremities. Good ROM, no contractures. Normal muscle tone.  Skin: no rashes, lesions, ulcers. No induration Neurologic: CN 2-12 grossly intact. Strength 5/5 in all 4.  Psychiatric: Normal judgment and insight. Alert and oriented x 3. Normal mood.    Labs on Admission: I have personally reviewed  following labs and imaging studies  CBC: Recent Labs  Lab 01/14/19 0800  WBC 11.2*  HGB 13.8  HCT 45.7  MCV 86.4  PLT 841   Basic Metabolic Panel: Recent Labs  Lab 01/14/19 0800  NA 140  K 2.5*  CL 101  CO2 25  GLUCOSE 286*  BUN 15  CREATININE 0.77  CALCIUM 9.2  MG 2.0   GFR: Estimated Creatinine Clearance: 81.7 mL/min (by C-G formula based on SCr of 0.77 mg/dL). Liver Function Tests: Recent Labs  Lab 01/14/19 0800  AST 23  ALT 21  ALKPHOS 78  BILITOT 1.3*  PROT 7.9  ALBUMIN 4.5   Recent Labs  Lab 01/14/19 0800  LIPASE 42  No results for input(s): AMMONIA in the last 168 hours. Coagulation Profile: Recent Labs  Lab 01/14/19 0813  INR 1.0   Cardiac Enzymes: No results for input(s): CKTOTAL, CKMB, CKMBINDEX, TROPONINI in the last 168 hours. BNP (last 3 results) No results for input(s): PROBNP in the last 8760 hours. HbA1C: No results for input(s): HGBA1C in the last 72 hours. CBG: Recent Labs  Lab 01/14/19 0942  GLUCAP 221*   Lipid Profile: No results for input(s): CHOL, HDL, LDLCALC, TRIG, CHOLHDL, LDLDIRECT in the last 72 hours. Thyroid Function Tests: No results for input(s): TSH, T4TOTAL, FREET4, T3FREE, THYROIDAB in the last 72 hours. Anemia Panel: No results for input(s): VITAMINB12, FOLATE, FERRITIN, TIBC, IRON, RETICCTPCT in the last 72 hours. Urine analysis:    Component Value Date/Time   COLORURINE YELLOW 01/14/2019 0813   APPEARANCEUR CLEAR 01/14/2019 0813   LABSPEC 1.015 01/14/2019 0813   PHURINE 7.5 01/14/2019 0813   GLUCOSEU >=500 (A) 01/14/2019 0813   HGBUR NEGATIVE 01/14/2019 0813   BILIRUBINUR NEGATIVE 01/14/2019 0813   KETONESUR TRACE (A) 01/14/2019 0813   PROTEINUR 30 (A) 01/14/2019 0813   UROBILINOGEN 0.2 09/04/2014 1850   NITRITE NEGATIVE 01/14/2019 0813   LEUKOCYTESUR NEGATIVE 01/14/2019 0813    Radiological Exams on Admission: Ct Abdomen Pelvis W Contrast  Result Date: 01/14/2019 CLINICAL DATA:  Abdominal  distension. EXAM: CT ABDOMEN AND PELVIS WITH CONTRAST TECHNIQUE: Multidetector CT imaging of the abdomen and pelvis was performed using the standard protocol following bolus administration of intravenous contrast. CONTRAST:  OMNIPAQUE IOHEXOL 300 MG/ML  SOLN COMPARISON:  CT scan of July 21, 2017. FINDINGS: Lower chest: No acute abnormality. Hepatobiliary: No focal liver abnormality is seen. No gallstones, gallbladder wall thickening, or biliary dilatation. Pancreas: Unremarkable. No pancreatic ductal dilatation or surrounding inflammatory changes. Spleen: Normal in size without focal abnormality. Adrenals/Urinary Tract: Adrenal glands are unremarkable. Kidneys are normal, without renal calculi, focal lesion, or hydronephrosis. Mild urinary bladder distention is noted. Stomach/Bowel: Status post gastric surgery. There is no evidence of bowel obstruction or inflammation. The appendix is not visualized. Vascular/Lymphatic: Aortic atherosclerosis. No enlarged abdominal or pelvic lymph nodes. Reproductive: Uterus and bilateral adnexa are unremarkable. Other: No abdominal wall hernia or abnormality. No abdominopelvic ascites. Musculoskeletal: No acute or significant osseous findings. IMPRESSION: Mild urinary bladder distention is noted. No other acute abnormality seen in the abdomen or pelvis. Aortic Atherosclerosis (ICD10-I70.0). Electronically Signed   By: Lupita Raider M.D.   On: 01/14/2019 13:09   Dg Abdomen Acute W/chest  Result Date: 01/14/2019 CLINICAL DATA:  Vomiting, nausea. EXAM: DG ABDOMEN ACUTE W/ 1V CHEST COMPARISON:  Radiographs of May 09, 2015. FINDINGS: There is no evidence of dilated bowel loops or free intraperitoneal air. Postsurgical changes are noted in the left upper quadrant. No radiopaque calculi or other significant radiographic abnormality is seen. Heart size and mediastinal contours are within normal limits. Both lungs are clear. IMPRESSION: No definite evidence of bowel  obstruction or ileus. No acute cardiopulmonary disease. Electronically Signed   By: Lupita Raider M.D.   On: 01/14/2019 09:43    EKG: Independently reviewed.  Assessment/Plan Principal Problem:   Intractable nausea and vomiting Active Problems:   Diabetes mellitus type 2 in obese [facility])   Essential hypertension   Hypokalemia   Lactic acidosis   QT prolongation  N/V Lactic acidosis     - admit to inpt, med-tele     - started at 4 am     - zofran     -  fluids: NS w/ K+     - CT ab/pelvis is normal     - lactic acid is up     - gastroparesis? Check A1c     - liver numbers look ok     - check lipase     - issue with sleeve? If not improved by morning, consider gen surg consult  Essential HTN     - unable to keep down PO     - takes coreg at home; can schedule metoprolol IV here until able to take PO again  DM2     - SSI     - CLD as tolerated, carb-control     - check A1c  Hypokalemia Qt prolongation     - replete K+; now s/p 40mEq, repeat BNP     - check Mg2+     - spoke with pharmacy, stuck on the antiemetic front, limited options  DVT prophylaxis: lovenox  Code Status: FULL  Family Communication: None at bedside  Disposition Plan: TBD  Consults called: None  Admission status: Inpatient. Concern for acute decompensation without in-house treatment.    Teddy Spikeyrone A Evany Schecter DO [facility] Pager [phone]  If 7PM-7AM, please contact night-coverage [email or web address] Password [email or web address]  01/14/2019, 4:09 PM

## 2019-01-14 NOTE — ED Notes (Signed)
Lactic 2.8 K+ 2.5  PA made aware

## 2019-01-14 NOTE — Progress Notes (Signed)
   01/14/19 2109  MEWS Assessment  Is this an acute change? Yes  MEWS guidelines implemented *See [place]  Provider Notification  Provider Name/Title Schorr  Date Provider Notified 01/14/19  Time Provider Notified 2110  Notification Type Page  Notification Reason Other (Comment) (no change from preivous intervention)  Provider aware!

## 2019-01-14 NOTE — Plan of Care (Signed)
  Problem: Safety: Goal: Ability to remain free from injury will improve Outcome: Progressing   

## 2019-01-14 NOTE — ED Notes (Signed)
Patient transported to CT 

## 2019-01-14 NOTE — ED Triage Notes (Signed)
Per EMS: Pt is coming from home with N/V since 0400 denies any abdominal pain. EMS reports that pt is very "unsteady on her feet" but pt refuses to open eyes when ambulating. 18G L AC with 4 mg of zofran that only provided relief momentarily. CBG was 332 with a history of type 2 diabetes but is non compliant with monitoring sugar.

## 2019-01-14 NOTE — Progress Notes (Addendum)
Schorr notified of BP 167/108, HR 130 after lopressor 2.5mg  given, and pts request for sleep aide.

## 2019-01-14 NOTE — ED Notes (Addendum)
Pt has frequent occurrences of dark green colored emesis. Each occurrence is of small volume.

## 2019-01-14 NOTE — Progress Notes (Signed)
CRITICAL VALUE STICKER  CRITICAL VALUE:K+2.6  RECEIVER (on-site recipient of call):Julieth Tugman  DATE & TIME NOTIFIED:   MESSENGER (representative from lab):  MD NOTIFIED:Schorr  TIME OF NOTIFICATION:0735  RESPONSE: waiting

## 2019-01-15 DIAGNOSIS — E872 Acidosis: Secondary | ICD-10-CM

## 2019-01-15 DIAGNOSIS — E669 Obesity, unspecified: Secondary | ICD-10-CM

## 2019-01-15 DIAGNOSIS — E876 Hypokalemia: Secondary | ICD-10-CM

## 2019-01-15 DIAGNOSIS — R9431 Abnormal electrocardiogram [ECG] [EKG]: Secondary | ICD-10-CM

## 2019-01-15 DIAGNOSIS — I10 Essential (primary) hypertension: Secondary | ICD-10-CM

## 2019-01-15 DIAGNOSIS — E1169 Type 2 diabetes mellitus with other specified complication: Secondary | ICD-10-CM

## 2019-01-15 LAB — URINE CULTURE

## 2019-01-15 LAB — CBC
HCT: 44 % (ref 36.0–46.0)
Hemoglobin: 13.8 g/dL (ref 12.0–15.0)
MCH: 26.4 pg (ref 26.0–34.0)
MCHC: 31.4 g/dL (ref 30.0–36.0)
MCV: 84.3 fL (ref 80.0–100.0)
Platelets: 188 10*3/uL (ref 150–400)
RBC: 5.22 MIL/uL — ABNORMAL HIGH (ref 3.87–5.11)
RDW: 13.5 % (ref 11.5–15.5)
WBC: 16.3 10*3/uL — ABNORMAL HIGH (ref 4.0–10.5)
nRBC: 0 % (ref 0.0–0.2)

## 2019-01-15 LAB — COMPREHENSIVE METABOLIC PANEL
ALT: 17 U/L (ref 0–44)
AST: 20 U/L (ref 15–41)
Albumin: 3.7 g/dL (ref 3.5–5.0)
Alkaline Phosphatase: 62 U/L (ref 38–126)
Anion gap: 11 (ref 5–15)
BUN: 16 mg/dL (ref 6–20)
CO2: 21 mmol/L — ABNORMAL LOW (ref 22–32)
Calcium: 8.5 mg/dL — ABNORMAL LOW (ref 8.9–10.3)
Chloride: 107 mmol/L (ref 98–111)
Creatinine, Ser: 0.8 mg/dL (ref 0.44–1.00)
GFR calc Af Amer: >60 mL/min (ref >60)
GFR calc non Af Amer: >60 mL/min (ref >60)
Glucose, Bld: 200 mg/dL — ABNORMAL HIGH (ref 70–99)
Potassium: 2.7 mmol/L — CL (ref 3.5–5.1)
Sodium: 139 mmol/L (ref 135–145)
Total Bilirubin: 1.7 mg/dL — ABNORMAL HIGH (ref 0.3–1.2)
Total Protein: 6.6 g/dL (ref 6.5–8.1)

## 2019-01-15 LAB — HEMOGLOBIN A1C
Hgb A1c MFr Bld: 7.7 % — ABNORMAL HIGH (ref 4.8–5.6)
Mean Plasma Glucose: 174 mg/dL

## 2019-01-15 LAB — LACTIC ACID, PLASMA
Lactic Acid, Venous: 4.8 mmol/L (ref 0.5–1.9)
Lactic Acid, Venous: 2.5 mmol/L (ref 0.5–1.9)

## 2019-01-15 LAB — HIV ANTIBODY (ROUTINE TESTING W REFLEX): HIV Screen 4th Generation wRfx: NONREACTIVE

## 2019-01-15 MED ORDER — CLONIDINE HCL 0.2 MG PO TABS
0.2000 mg | ORAL_TABLET | Freq: Once | ORAL | Status: AC
  Administered 2019-01-15: 0.2 mg via ORAL
  Filled 2019-01-15: qty 1

## 2019-01-15 MED ORDER — POTASSIUM CHLORIDE 10 MEQ/100ML IV SOLN
10.0000 meq | INTRAVENOUS | Status: AC
  Administered 2019-01-15 (×6): 10 meq via INTRAVENOUS
  Filled 2019-01-15 (×6): qty 100

## 2019-01-15 MED ORDER — SODIUM CHLORIDE 0.9 % IV SOLN
INTRAVENOUS | Status: AC
  Administered 2019-01-15 (×2): via INTRAVENOUS

## 2019-01-15 MED ORDER — POTASSIUM CHLORIDE 10 MEQ/100ML IV SOLN
10.0000 meq | INTRAVENOUS | Status: AC
  Administered 2019-01-15 (×3): 10 meq via INTRAVENOUS
  Filled 2019-01-15 (×3): qty 100

## 2019-01-15 MED ORDER — SODIUM CHLORIDE 0.9 % IV BOLUS
1000.0000 mL | Freq: Once | INTRAVENOUS | Status: AC
  Administered 2019-01-15: 1000 mL via INTRAVENOUS

## 2019-01-15 NOTE — Plan of Care (Signed)
Patient able to tolerate full liquid diet without nausea/vomiting.  Patient up to bathroom, very unsteady on feet, did feel nauseous after exertion however once she rested in bed felt back to normal and was able to eat her lunch.  Husband at bedside.

## 2019-01-15 NOTE — Progress Notes (Signed)
PROGRESS NOTE  Latoya Nottinghamureka Cox ZOX:[id number] DOB: 08/17/1965 DOA: 01/14/2019 PCP: Myrlene Brokerrawford, Elizabeth A, MD   LOS: 1 day   Brief narrative:  Latoya Nottinghamureka Blankley is a 53 y.o. female with medical history significant of DM2, gastric sleeve. Presents with nausea starting at 4am. She reports no heralding symptoms. She just started vomitting and hasn't been able to stop. She denies any sick contacts. She denies any aggravating or alleviating factors. She denies any recent illness. She became concerned when she couldn't control the vomitting and came to the ED.     ED Course: Evaluated by ED. Found to be hypokalemic, hyperglycemic, and hypertensive. Imaging was negative. [facility] was called for admission.   Assessment/Plan:  Principal Problem:   Intractable nausea and vomiting Active Problems:   Diabetes mellitus type 2 in obese [facility](HCC)   Essential hypertension   Hypokalemia   Lactic acidosis   QT prolongation  Intractable nausea and vomiting Improved.  Will advance diet as tolerated.  Continue on IV fluids.  Lactic acid was persistently elevated.  Will check a lactate in the afternoon.  Possibility of viral gastritis/diabetic gastroparesis.  Hemoglobin A1c of 7.7.  Urine culture  negative so far.  LFTs within normal limit.  Lipase within normal limits.  Severe hypokalemia.  Continue replacement through IV route.  Will continue IV fluids and IV potassium throughout the day.  Magnesium level was within normal limits at 2.0. QT was prolonged. Will repeat EKG  Elevated lactic acid.  Likely secondary to volume depletion.  Check lactate in the afternoon.  Continue supportive care IV fluids.  Essential HTN    on coreg at home, on metoprolol iv .  Change to Coreg when oral is adequate.   VTE Prophylaxis: Lovenox  Code Status: Full code  Family Communication: None at bedside  Disposition Plan: Likely home in 1 to 2 days, aggressively replenish electrolytes.  Continue IV  fluids.   Consultants:  None  Procedures:  None  Antibiotics: Anti-infectives (From admission, onward)   None      Subjective: Patient complains of vomiting yesterday.  Patient denies any shortness of breath, chest pain, palpitation, fever or chills.  Has been tolerating some clears.  No diarrhea.  Denies abdominal pain.  Objective: Vitals:   01/15/19 0505 01/15/19 0844  BP: 129/85 128/87  Pulse: (!) 119 88  Resp: 15 18  Temp: 99.2 F (37.3 C) 97.9 F (36.6 C)  SpO2: 100% 100%    Intake/Output Summary (Last 24 hours) at 01/15/2019 1029 Last data filed at 01/15/2019 0553 Gross per 24 hour  Intake 3005.19 ml  Output 300 ml  Net 2705.19 ml   Filed Weights   01/14/19 0753  Weight: 80.3 kg   Body mass index is 32.37 kg/m.   Physical Exam: GENERAL: Patient is alert awake and oriented. Not in obvious distress.  Obese HENT: No scleral pallor or icterus. Pupils equally reactive to light. Oral mucosa is dry. NECK: is supple, no palpable thyroid enlargement. CHEST: Clear to auscultation. No crackles or wheezes. Non tender on palpation. Diminished breath sounds bilaterally. CVS: S1 and S2 heard, no murmur. Regular rate and rhythm. No pericardial rub. ABDOMEN: Soft, non-tender, bowel sounds are present. No palpable hepato-splenomegaly. EXTREMITIES: No edema. CNS: Cranial nerves are intact. No focal motor or sensory deficits. SKIN: warm and dry without rashes.  Diminished skin turgor noted  Data Review: I have personally reviewed the following laboratory data and studies,  CBC: Recent Labs  Lab 01/14/19 0800 01/14/19 1843 01/15/19 0427  WBC 11.2* 14.0* 16.3*  HGB 13.8 14.1 13.8  HCT 45.7 45.3 44.0  MCV 86.4 84.4 84.3  PLT 192 203 188   Basic Metabolic Panel: Recent Labs  Lab 01/14/19 0800 01/14/19 1843 01/15/19 0427  NA 140 142 139  K 2.5* 2.6* 2.7*  CL 101 102 107  CO2 25 23 21*  GLUCOSE 286* 208* 200*  BUN 15 10 16   CREATININE 0.77 0.63 0.80   CALCIUM 9.2 8.9 8.5*  MG 2.0 1.7  --    Liver Function Tests: Recent Labs  Lab 01/14/19 0800 01/15/19 0427  AST 23 20  ALT 21 17  ALKPHOS 78 62  BILITOT 1.3* 1.7*  PROT 7.9 6.6  ALBUMIN 4.5 3.7   Recent Labs  Lab 01/14/19 0800  LIPASE 42   No results for input(s): AMMONIA in the last 168 hours. Cardiac Enzymes: No results for input(s): CKTOTAL, CKMB, CKMBINDEX, TROPONINI in the last 168 hours. BNP (last 3 results) No results for input(s): BNP in the last 8760 hours.  ProBNP (last 3 results) No results for input(s): PROBNP in the last 8760 hours.  CBG: Recent Labs  Lab 01/14/19 0942  GLUCAP 221*   Recent Results (from the past 240 hour(s))  Blood Culture (routine x 2)     Status: None (Preliminary result)   Collection Time: 01/14/19  8:13 AM   Specimen: BLOOD RIGHT ARM  Result Value Ref Range Status   Specimen Description BLOOD RIGHT ARM  Final   Special Requests   Final    BOTTLES DRAWN AEROBIC AND ANAEROBIC Blood Culture adequate volume Performed at [facility], [address], [place], [place] [address]    Culture PENDING  Incomplete   Report Status PENDING  Incomplete  Urine culture     Status: None   Collection Time: 01/14/19  8:13 AM   Specimen: In/Out Cath Urine  Result Value Ref Range Status   Specimen Description   Final    IN/OUT CATH URINE Performed at [facility], [address] [place], [place] [address]    Special Requests   Final    NONE Performed at [facility], [address], [place], [place] [address]    Culture   Final    Multiple bacterial morphotypes present, none predominant. Suggest appropriate recollection if clinically indicated.   Report Status 01/15/2019 FINAL  Final  SARS Coronavirus 2 [facility] order, Performed in [facility] hospital lab) Nasopharyngeal Nasopharyngeal Swab     Status: None   Collection Time: 01/14/19  1:55 PM   Specimen: Nasopharyngeal Swab  Result Value  Ref Range Status   SARS Coronavirus 2 NEGATIVE NEGATIVE Final    Comment: (NOTE) If result is NEGATIVE SARS-CoV-2 target nucleic acids are NOT DETECTED. The SARS-CoV-2 RNA is generally detectable in upper and lower  respiratory specimens during the acute phase of infection. The lowest  concentration of SARS-CoV-2 viral copies this assay can detect is 250  copies / mL. A negative result does not preclude SARS-CoV-2 infection  and should not be used as the sole basis for treatment or other  patient management decisions.  A negative result may occur with  improper specimen collection / handling, submission of specimen other  than nasopharyngeal swab, presence of viral mutation(s) within the  areas targeted by this assay, and inadequate number of viral copies  (<250 copies / mL). A negative result must be combined with clinical  observations, patient history, and epidemiological information. If result is  POSITIVE SARS-CoV-2 target nucleic acids are DETECTED. The SARS-CoV-2 RNA is generally detectable in upper and lower  respiratory specimens dur ing the acute phase of infection.  Positive  results are indicative of active infection with SARS-CoV-2.  Clinical  correlation with patient history and other diagnostic information is  necessary to determine patient infection status.  Positive results do  not rule out bacterial infection or co-infection with other viruses. If result is PRESUMPTIVE POSTIVE SARS-CoV-2 nucleic acids MAY BE PRESENT.   A presumptive positive result was obtained on the submitted specimen  and confirmed on repeat testing.  While 2019 novel coronavirus  (SARS-CoV-2) nucleic acids may be present in the submitted sample  additional confirmatory testing may be necessary for epidemiological  and / or clinical management purposes  to differentiate between  SARS-CoV-2 and other Sarbecovirus currently known to infect humans.  If clinically indicated additional testing with an  alternate test  methodology [phone]([id number]) is advised. The SARS-CoV-2 RNA is generally  detectable in upper and lower respiratory sp ecimens during the acute  phase of infection. The expected result is Negative. Fact Sheet for Patients:  BoilerBrush.com.cyhttps://www.fda.gov/media/136312/download Fact Sheet for Healthcare Providers: https://pope.com/https://www.fda.gov/media/136313/download This test is not yet approved or cleared by the [place] FDA and has been authorized for detection and/or diagnosis of SARS-CoV-2 by FDA under an Emergency Use Authorization (EUA).  This EUA will remain in effect (meaning this test can be used) for the duration of the COVID-19 declaration under Section 564(b)(1) of the Act, 21 U.S.C. section 360bbb-3(b)(1), unless the authorization is terminated or revoked sooner. Performed at [facility], [address] [place], [place] [address]      Studies: Ct Abdomen Pelvis W Contrast  Result Date: 01/14/2019 CLINICAL DATA:  Abdominal distension. EXAM: CT ABDOMEN AND PELVIS WITH CONTRAST TECHNIQUE: Multidetector CT imaging of the abdomen and pelvis was performed using the standard protocol following bolus administration of intravenous contrast. CONTRAST:  100mL OMNIPAQUE IOHEXOL 300 MG/ML  SOLN COMPARISON:  CT scan of July 21, 2017. FINDINGS: Lower chest: No acute abnormality. Hepatobiliary: No focal liver abnormality is seen. No gallstones, gallbladder wall thickening, or biliary dilatation. Pancreas: Unremarkable. No pancreatic ductal dilatation or surrounding inflammatory changes. Spleen: Normal in size without focal abnormality. Adrenals/Urinary Tract: Adrenal glands are unremarkable. Kidneys are normal, without renal calculi, focal lesion, or hydronephrosis. Mild urinary bladder distention is noted. Stomach/Bowel: Status post gastric surgery. There is no evidence of bowel obstruction or inflammation. The appendix is not visualized. Vascular/Lymphatic: Aortic atherosclerosis.  No enlarged abdominal or pelvic lymph nodes. Reproductive: Uterus and bilateral adnexa are unremarkable. Other: No abdominal wall hernia or abnormality. No abdominopelvic ascites. Musculoskeletal: No acute or significant osseous findings. IMPRESSION: Mild urinary bladder distention is noted. No other acute abnormality seen in the abdomen or pelvis. Aortic Atherosclerosis (ICD10-I70.0). Electronically Signed   By: Lupita RaiderJames  Green Jr M.D.   On: 01/14/2019 13:09   Dg Abdomen Acute W/chest  Result Date: 01/14/2019 CLINICAL DATA:  Vomiting, nausea. EXAM: DG ABDOMEN ACUTE W/ 1V CHEST COMPARISON:  Radiographs of May 09, 2015. FINDINGS: There is no evidence of dilated bowel loops or free intraperitoneal air. Postsurgical changes are noted in the left upper quadrant. No radiopaque calculi or other significant radiographic abnormality is seen. Heart size and mediastinal contours are within normal limits. Both lungs are clear. IMPRESSION: No definite evidence of bowel obstruction or ileus. No acute cardiopulmonary disease. Electronically Signed   By: Lupita RaiderJames  Green Jr M.D.   On: 01/14/2019 09:43  Scheduled Meds:  enoxaparin (LOVENOX) injection  40 mg Subcutaneous Q24H   metoprolol tartrate  5 mg Intravenous Q6H   multivitamin with minerals  1 tablet Oral Daily   potassium chloride  40 mEq Oral Once   vitamin C  125 mg Oral Daily    Continuous Infusions:  sodium chloride 125 mL/hr at 01/15/19 0739   potassium chloride       Flora Lipps, MD  [facility] 01/15/2019

## 2019-01-15 NOTE — Progress Notes (Signed)
CRITICAL VALUE STICKER  CRITICAL VALUE: K+2.7  RECEIVER:Marlo Goodrich, RN  DATE & TIME NOTIFIED: 01/15/19@0520   MESSENGER: Vanessa Ralphs, A  MD NOTIFIED: Schorr  TIME OF NOTIFICATION:0520  RESPONSE: paged

## 2019-01-15 NOTE — Progress Notes (Signed)
Schorr, notified of pts BP 158/101, HR 128 after having a total of 17.5mg  of lopressor and 10mg  of hydralazine.

## 2019-01-15 NOTE — Discharge Summary (Deleted)
PROGRESS NOTE  Latoya Nottinghamureka Cox JXB:[id number] DOB: 09/13/1965 DOA: 01/14/2019 PCP: Myrlene Brokerrawford, Elizabeth A, MD   LOS: 1 day   Brief narrative:  Latoya Nottinghamureka Trimble is a 53 y.o. female with medical history significant of DM2, gastric sleeve. Presents with nausea starting at 4am. She reports no heralding symptoms. She just started vomitting and hasn't been able to stop. She denies any sick contacts. She denies any aggravating or alleviating factors. She denies any recent illness. She became concerned when she couldn't control the vomitting and came to the ED.     ED Course: Evaluated by ED. Found to be hypokalemic, hyperglycemic, and hypertensive. Imaging was negative. TRH was called for admission.   Assessment/Plan:  Principal Problem:   Intractable nausea and vomiting Active Problems:   Diabetes mellitus type 2 in obese [facility](HCC)   Essential hypertension   Hypokalemia   Lactic acidosis   QT prolongation  Intractable nausea and vomiting Improved.  Will advance diet as tolerated.  Continue on IV fluids.  Lactic acid was persistently elevated.  Will check a lactate in the afternoon.  Possibility of viral gastritis/diabetic gastroparesis.  Hemoglobin A1c of 7.7.  Urine culture  negative so far.  LFTs within normal limit.  Lipase within normal limits.  Severe hypokalemia.  Continue replacement through IV route.  Will continue IV fluids and IV potassium throughout the day.  Magnesium level was within normal limits at 2.0. QT was prolonged. Will repeat EKG  Elevated lactic acid.  Likely secondary to volume depletion.  Check lactate in the afternoon.  Continue supportive care IV fluids.  Essential HTN    on coreg at home, on metoprolol iv .  Change to Coreg when oral is adequate.   VTE Prophylaxis: Lovenox  Code Status: Full code  Family Communication: None at bedside  Disposition Plan: Likely home in 1 to 2 days, aggressively replenish electrolytes.  Continue IV  fluids.   Consultants:  None  Procedures:  None  Antibiotics: Anti-infectives (From admission, onward)   None      Subjective: Patient complains of vomiting yesterday.  Patient denies any shortness of breath, chest pain, palpitation, fever or chills.  Has been tolerating some clears.  No diarrhea.  Denies abdominal pain.  Objective: Vitals:   01/15/19 0505 01/15/19 0844  BP: 129/85 128/87  Pulse: (!) 119 88  Resp: 15 18  Temp: 99.2 F (37.3 C) 97.9 F (36.6 C)  SpO2: 100% 100%    Intake/Output Summary (Last 24 hours) at 01/15/2019 1019 Last data filed at 01/15/2019 0553 Gross per 24 hour  Intake 3005.19 ml  Output 300 ml  Net 2705.19 ml   Filed Weights   01/14/19 0753  Weight: 80.3 kg   Body mass index is 32.37 kg/m.   Physical Exam: GENERAL: Patient is alert awake and oriented. Not in obvious distress.  Obese HENT: No scleral pallor or icterus. Pupils equally reactive to light. Oral mucosa is dry. NECK: is supple, no palpable thyroid enlargement. CHEST: Clear to auscultation. No crackles or wheezes. Non tender on palpation. Diminished breath sounds bilaterally. CVS: S1 and S2 heard, no murmur. Regular rate and rhythm. No pericardial rub. ABDOMEN: Soft, non-tender, bowel sounds are present. No palpable hepato-splenomegaly. EXTREMITIES: No edema. CNS: Cranial nerves are intact. No focal motor or sensory deficits. SKIN: warm and dry without rashes.  Diminished skin turgor noted  Data Review: I have personally reviewed the following laboratory data and studies,  CBC: Recent Labs  Lab 01/14/19 0800 01/14/19 1843 01/15/19 0427  WBC 11.2* 14.0* 16.3*  HGB 13.8 14.1 13.8  HCT 45.7 45.3 44.0  MCV 86.4 84.4 84.3  PLT 192 203 188   Basic Metabolic Panel: Recent Labs  Lab 01/14/19 0800 01/14/19 1843 01/15/19 0427  NA 140 142 139  K 2.5* 2.6* 2.7*  CL 101 102 107  CO2 25 23 21*  GLUCOSE 286* 208* 200*  BUN 15 10 16   CREATININE 0.77 0.63 0.80   CALCIUM 9.2 8.9 8.5*  MG 2.0 1.7  --    Liver Function Tests: Recent Labs  Lab 01/14/19 0800 01/15/19 0427  AST 23 20  ALT 21 17  ALKPHOS 78 62  BILITOT 1.3* 1.7*  PROT 7.9 6.6  ALBUMIN 4.5 3.7   Recent Labs  Lab 01/14/19 0800  LIPASE 42   No results for input(s): AMMONIA in the last 168 hours. Cardiac Enzymes: No results for input(s): CKTOTAL, CKMB, CKMBINDEX, TROPONINI in the last 168 hours. BNP (last 3 results) No results for input(s): BNP in the last 8760 hours.  ProBNP (last 3 results) No results for input(s): PROBNP in the last 8760 hours.  CBG: Recent Labs  Lab 01/14/19 0942  GLUCAP 221*   Recent Results (from the past 240 hour(s))  Blood Culture (routine x 2)     Status: None (Preliminary result)   Collection Time: 01/14/19  8:13 AM   Specimen: BLOOD RIGHT ARM  Result Value Ref Range Status   Specimen Description BLOOD RIGHT ARM  Final   Special Requests   Final    BOTTLES DRAWN AEROBIC AND ANAEROBIC Blood Culture adequate volume Performed at [facility], [address], [place], [place] [address]    Culture PENDING  Incomplete   Report Status PENDING  Incomplete  Urine culture     Status: None   Collection Time: 01/14/19  8:13 AM   Specimen: In/Out Cath Urine  Result Value Ref Range Status   Specimen Description   Final    IN/OUT CATH URINE Performed at [facility], [address], [place], [place] [address]    Special Requests   Final    NONE Performed at [facility], [address] [place], [place] [address]    Culture   Final    Multiple bacterial morphotypes present, none predominant. Suggest appropriate recollection if clinically indicated.   Report Status 01/15/2019 FINAL  Final  SARS Coronavirus 2 [facility] order, Performed in [facility] hospital lab) Nasopharyngeal Nasopharyngeal Swab     Status: None   Collection Time: 01/14/19  1:55 PM   Specimen: Nasopharyngeal Swab  Result Value  Ref Range Status   SARS Coronavirus 2 NEGATIVE NEGATIVE Final    Comment: (NOTE) If result is NEGATIVE SARS-CoV-2 target nucleic acids are NOT DETECTED. The SARS-CoV-2 RNA is generally detectable in upper and lower  respiratory specimens during the acute phase of infection. The lowest  concentration of SARS-CoV-2 viral copies this assay can detect is 250  copies / mL. A negative result does not preclude SARS-CoV-2 infection  and should not be used as the sole basis for treatment or other  patient management decisions.  A negative result may occur with  improper specimen collection / handling, submission of specimen other  than nasopharyngeal swab, presence of viral mutation(s) within the  areas targeted by this assay, and inadequate number of viral copies  (<250 copies / mL). A negative result must be combined with clinical  observations, patient history, and epidemiological information. If result is  POSITIVE SARS-CoV-2 target nucleic acids are DETECTED. The SARS-CoV-2 RNA is generally detectable in upper and lower  respiratory specimens dur ing the acute phase of infection.  Positive  results are indicative of active infection with SARS-CoV-2.  Clinical  correlation with patient history and other diagnostic information is  necessary to determine patient infection status.  Positive results do  not rule out bacterial infection or co-infection with other viruses. If result is PRESUMPTIVE POSTIVE SARS-CoV-2 nucleic acids MAY BE PRESENT.   A presumptive positive result was obtained on the submitted specimen  and confirmed on repeat testing.  While 2019 novel coronavirus  (SARS-CoV-2) nucleic acids may be present in the submitted sample  additional confirmatory testing may be necessary for epidemiological  and / or clinical management purposes  to differentiate between  SARS-CoV-2 and other Sarbecovirus currently known to infect humans.  If clinically indicated additional testing with an  alternate test  methodology [phone]([id number]) is advised. The SARS-CoV-2 RNA is generally  detectable in upper and lower respiratory sp ecimens during the acute  phase of infection. The expected result is Negative. Fact Sheet for Patients:  BoilerBrush.com.cyhttps://www.fda.gov/media/136312/download Fact Sheet for Healthcare Providers: https://pope.com/https://www.fda.gov/media/136313/download This test is not yet approved or cleared by the [place] FDA and has been authorized for detection and/or diagnosis of SARS-CoV-2 by FDA under an Emergency Use Authorization (EUA).  This EUA will remain in effect (meaning this test can be used) for the duration of the COVID-19 declaration under Section 564(b)(1) of the Act, 21 U.S.C. section 360bbb-3(b)(1), unless the authorization is terminated or revoked sooner. Performed at [facility], [address] [place], [place] [address]      Studies: Ct Abdomen Pelvis W Contrast  Result Date: 01/14/2019 CLINICAL DATA:  Abdominal distension. EXAM: CT ABDOMEN AND PELVIS WITH CONTRAST TECHNIQUE: Multidetector CT imaging of the abdomen and pelvis was performed using the standard protocol following bolus administration of intravenous contrast. CONTRAST:  100mL OMNIPAQUE IOHEXOL 300 MG/ML  SOLN COMPARISON:  CT scan of July 21, 2017. FINDINGS: Lower chest: No acute abnormality. Hepatobiliary: No focal liver abnormality is seen. No gallstones, gallbladder wall thickening, or biliary dilatation. Pancreas: Unremarkable. No pancreatic ductal dilatation or surrounding inflammatory changes. Spleen: Normal in size without focal abnormality. Adrenals/Urinary Tract: Adrenal glands are unremarkable. Kidneys are normal, without renal calculi, focal lesion, or hydronephrosis. Mild urinary bladder distention is noted. Stomach/Bowel: Status post gastric surgery. There is no evidence of bowel obstruction or inflammation. The appendix is not visualized. Vascular/Lymphatic: Aortic atherosclerosis.  No enlarged abdominal or pelvic lymph nodes. Reproductive: Uterus and bilateral adnexa are unremarkable. Other: No abdominal wall hernia or abnormality. No abdominopelvic ascites. Musculoskeletal: No acute or significant osseous findings. IMPRESSION: Mild urinary bladder distention is noted. No other acute abnormality seen in the abdomen or pelvis. Aortic Atherosclerosis (ICD10-I70.0). Electronically Signed   By: Lupita RaiderJames  Green Jr M.D.   On: 01/14/2019 13:09   Dg Abdomen Acute W/chest  Result Date: 01/14/2019 CLINICAL DATA:  Vomiting, nausea. EXAM: DG ABDOMEN ACUTE W/ 1V CHEST COMPARISON:  Radiographs of May 09, 2015. FINDINGS: There is no evidence of dilated bowel loops or free intraperitoneal air. Postsurgical changes are noted in the left upper quadrant. No radiopaque calculi or other significant radiographic abnormality is seen. Heart size and mediastinal contours are within normal limits. Both lungs are clear. IMPRESSION: No definite evidence of bowel obstruction or ileus. No acute cardiopulmonary disease. Electronically Signed   By: Lupita RaiderJames  Green Jr M.D.   On: 01/14/2019 09:43  Scheduled Meds:  enoxaparin (LOVENOX) injection  40 mg Subcutaneous Q24H   metoprolol tartrate  5 mg Intravenous Q6H   multivitamin with minerals  1 tablet Oral Daily   potassium chloride  40 mEq Oral Once   vitamin C  125 mg Oral Daily    Continuous Infusions:  sodium chloride 125 mL/hr at 01/15/19 0739   potassium chloride       Flora Lipps, MD  [facility] 01/15/2019

## 2019-01-16 LAB — BASIC METABOLIC PANEL
Anion gap: 7 (ref 5–15)
BUN: 20 mg/dL (ref 6–20)
CO2: 20 mmol/L — ABNORMAL LOW (ref 22–32)
Calcium: 8.5 mg/dL — ABNORMAL LOW (ref 8.9–10.3)
Chloride: 112 mmol/L — ABNORMAL HIGH (ref 98–111)
Creatinine, Ser: 0.86 mg/dL (ref 0.44–1.00)
GFR calc Af Amer: >60 mL/min (ref >60)
GFR calc non Af Amer: >60 mL/min (ref >60)
Glucose, Bld: 161 mg/dL — ABNORMAL HIGH (ref 70–99)
Potassium: 3.2 mmol/L — ABNORMAL LOW (ref 3.5–5.1)
Sodium: 139 mmol/L (ref 135–145)

## 2019-01-16 LAB — CBC
HCT: 41.5 % (ref 36.0–46.0)
Hemoglobin: 12.8 g/dL (ref 12.0–15.0)
MCH: 26.5 pg (ref 26.0–34.0)
MCHC: 30.8 g/dL (ref 30.0–36.0)
MCV: 85.9 fL (ref 80.0–100.0)
Platelets: 170 10*3/uL (ref 150–400)
RBC: 4.83 MIL/uL (ref 3.87–5.11)
RDW: 13.7 % (ref 11.5–15.5)
WBC: 11.7 10*3/uL — ABNORMAL HIGH (ref 4.0–10.5)
nRBC: 0 % (ref 0.0–0.2)

## 2019-01-16 LAB — PHOSPHORUS: Phosphorus: 2.6 mg/dL (ref 2.5–4.6)

## 2019-01-16 LAB — MAGNESIUM: Magnesium: 2.1 mg/dL (ref 1.7–2.4)

## 2019-01-16 LAB — LACTIC ACID, PLASMA: Lactic Acid, Venous: 1.2 mmol/L (ref 0.5–1.9)

## 2019-01-16 MED ORDER — MAGNESIUM OXIDE 400 (241.3 MG) MG PO TABS
400.0000 mg | ORAL_TABLET | Freq: Two times a day (BID) | ORAL | Status: DC
  Administered 2019-01-16 – 2019-01-17 (×2): 400 mg via ORAL
  Filled 2019-01-16 (×2): qty 1

## 2019-01-16 MED ORDER — DILTIAZEM HCL 25 MG/5ML IV SOLN
10.0000 mg | Freq: Once | INTRAVENOUS | Status: AC
  Administered 2019-01-16: 10 mg via INTRAVENOUS
  Filled 2019-01-16: qty 5

## 2019-01-16 MED ORDER — POTASSIUM CHLORIDE CRYS ER 20 MEQ PO TBCR
40.0000 meq | EXTENDED_RELEASE_TABLET | Freq: Two times a day (BID) | ORAL | Status: DC
  Administered 2019-01-16 – 2019-01-17 (×2): 40 meq via ORAL
  Filled 2019-01-16 (×3): qty 2

## 2019-01-16 MED ORDER — POTASSIUM CHLORIDE 10 MEQ/100ML IV SOLN
10.0000 meq | INTRAVENOUS | Status: AC
  Administered 2019-01-16 (×4): 10 meq via INTRAVENOUS
  Filled 2019-01-16 (×4): qty 100

## 2019-01-16 MED ORDER — SODIUM CHLORIDE 0.9 % IV SOLN
INTRAVENOUS | Status: AC
  Administered 2019-01-16 – 2019-01-17 (×3): via INTRAVENOUS

## 2019-01-16 MED ORDER — SODIUM CHLORIDE 0.9 % IV BOLUS
1000.0000 mL | Freq: Once | INTRAVENOUS | Status: AC
  Administered 2019-01-16: 1000 mL via INTRAVENOUS

## 2019-01-16 MED ORDER — POTASSIUM CHLORIDE CRYS ER 20 MEQ PO TBCR
40.0000 meq | EXTENDED_RELEASE_TABLET | Freq: Once | ORAL | Status: AC
  Administered 2019-01-16: 40 meq via ORAL
  Filled 2019-01-16: qty 2

## 2019-01-16 MED ORDER — METOPROLOL TARTRATE 5 MG/5ML IV SOLN
5.0000 mg | INTRAVENOUS | Status: AC | PRN
  Administered 2019-01-16 (×2): 5 mg via INTRAVENOUS
  Filled 2019-01-16 (×2): qty 5

## 2019-01-16 MED ORDER — DOXYLAMINE SUCCINATE (SLEEP) 25 MG PO TABS
25.0000 mg | ORAL_TABLET | Freq: Every evening | ORAL | Status: DC | PRN
  Filled 2019-01-16: qty 1

## 2019-01-16 MED ORDER — HYDRALAZINE HCL 20 MG/ML IJ SOLN
10.0000 mg | Freq: Four times a day (QID) | INTRAMUSCULAR | Status: DC | PRN
  Administered 2019-01-16 – 2019-01-17 (×2): 10 mg via INTRAVENOUS
  Filled 2019-01-16 (×2): qty 1

## 2019-01-16 MED ORDER — CARVEDILOL 25 MG PO TABS
25.0000 mg | ORAL_TABLET | Freq: Two times a day (BID) | ORAL | Status: DC
  Administered 2019-01-16 – 2019-01-17 (×2): 25 mg via ORAL
  Filled 2019-01-16 (×2): qty 1

## 2019-01-16 NOTE — Progress Notes (Addendum)
PROGRESS NOTE  Latoya Nottinghamureka Kosch OZH:[id number] DOB: 05/09/1965 DOA: 01/14/2019 PCP: Myrlene Brokerrawford, Elizabeth A, MD   LOS: 2 days   Brief narrative:  Latoya Cox is a 53 y.o. female with medical history significant of DM2, gastric sleeve surgery presented with nausea and vomitting and wasn't able to stop. She denied any sick contacts. She denied any aggravating or alleviating factors. She denied any recent illness. Patient became concerned when she couldn't control the vomitting and came to the ED.   ED Course:  Found to be hypokalemic, hyperglycemic, and hypertensive. Imaging was negative. TRH was called for admission.   Assessment/Plan:  Principal Problem:   Intractable nausea and vomiting Active Problems:   Diabetes mellitus type 2 in obese [facility](HCC)   Essential hypertension   Hypokalemia   Lactic acidosis   QT prolongation  Intractable nausea and vomiting Improved.  Will advance diet as tolerated to soft diet today.  Continue on IV fluids.  Lactic acid was persistently elevated.  Received 1 L of IV fluid bolus with a slightly decreased lactate.  Will check lactate at this morning.  Possibility of viral gastritis/diabetic gastroparesis.  Hemoglobin A1c of 7.7.  Urine culture  negative so far.  LFTs within normal limit.  Lipase within normal limits.  Severe hypokalemia.  Patient received 100 mEq of potassium through IV yesterday.  Potassium is still 3.2 today.  We will continue oral and IV potassium throughout the day today.  Magnesium level was within normal limits at 2.0. QT was prolonged but repeat EKG showed mildly improved QTc interval.  Add p.o. potassium twice daily from tonight.  Elevated lactic acid.  Likely secondary to volume depletion.  Improved after IV fluid hydration.  Essential HTN now accelerated.    on coreg at home, on metoprolol iv .  Will initiate Coreg today.  PRN hydralazine will be added.  Weakness debility deconditioning.  Nursing staff reported concerns with weakness.   Will get PT evaluation and ambulation.   VTE Prophylaxis: Lovenox  Code Status: Full code  Family Communication: None at bedside  Disposition Plan: Likely home tomorrow if she continues to improve.  Will monitor electrolytes closely.  Decrease IV fluids.  Encourage oral fluids and advance diet.. Check PT for ambulation.   Consultants:  None  Procedures:  None  Antibiotics: Anti-infectives (From admission, onward)   None      Subjective: Patient complains of feeling better today.  Has generalized weakness.  Denies vomiting or abdominal pain.  Denies urinary urgency, frequency or dysuria.  Objective: Vitals:   01/15/19 2046 01/16/19 0416  BP: (!) 140/105 (!) 142/105  Pulse: 95 84  Resp: 16 16  Temp: 98.6 F (37 C) 98.6 F (37 C)  SpO2: 100% 100%    Intake/Output Summary (Last 24 hours) at 01/16/2019 1127 Last data filed at 01/16/2019 0847 Gross per 24 hour  Intake 1992.73 ml  Output 300 ml  Net 1692.73 ml   Filed Weights   01/14/19 0753  Weight: 80.3 kg   Body mass index is 32.37 kg/m.   Physical Exam: GENERAL: Patient is alert awake and oriented. Not in obvious distress.  Obese HENT: No scleral pallor or icterus. Pupils equally reactive to light. Oral mucosa is dry. NECK: is supple, no palpable thyroid enlargement. CHEST: Clear to auscultation. No crackles or wheezes. Non tender on palpation. Diminished breath sounds bilaterally. CVS: S1 and S2 heard, no murmur. Regular rate and rhythm. No pericardial rub. ABDOMEN: Soft, non-tender, bowel sounds are present. No palpable hepato-splenomegaly. EXTREMITIES:  No edema. CNS: Cranial nerves are intact. No focal motor or sensory deficits. SKIN: warm and dry without rashes.  Diminished skin turgor noted  Data Review: I have personally reviewed the following laboratory data and studies,  CBC: Recent Labs  Lab 01/14/19 0800 01/14/19 1843 01/15/19 0427 01/16/19 0352  WBC 11.2* 14.0* 16.3* 11.7*  HGB 13.8  14.1 13.8 12.8  HCT 45.7 45.3 44.0 41.5  MCV 86.4 84.4 84.3 85.9  PLT 192 203 188 170   Basic Metabolic Panel: Recent Labs  Lab 01/14/19 0800 01/14/19 1843 01/15/19 0427 01/16/19 0352  NA 140 142 139 139  K 2.5* 2.6* 2.7* 3.2*  CL 101 102 107 112*  CO2 25 23 21* 20*  GLUCOSE 286* 208* 200* 161*  BUN 15 10 16 20   CREATININE 0.77 0.63 0.80 0.86  CALCIUM 9.2 8.9 8.5* 8.5*  MG 2.0 1.7  --  2.1  PHOS  --   --   --  2.6   Liver Function Tests: Recent Labs  Lab 01/14/19 0800 01/15/19 0427  AST 23 20  ALT 21 17  ALKPHOS 78 62  BILITOT 1.3* 1.7*  PROT 7.9 6.6  ALBUMIN 4.5 3.7   Recent Labs  Lab 01/14/19 0800  LIPASE 42   No results for input(s): AMMONIA in the last 168 hours. Cardiac Enzymes: No results for input(s): CKTOTAL, CKMB, CKMBINDEX, TROPONINI in the last 168 hours. BNP (last 3 results) No results for input(s): BNP in the last 8760 hours.  ProBNP (last 3 results) No results for input(s): PROBNP in the last 8760 hours.  CBG: Recent Labs  Lab 01/14/19 0942  GLUCAP 221*   Recent Results (from the past 240 hour(s))  Blood Culture (routine x 2)     Status: None (Preliminary result)   Collection Time: 01/14/19  8:13 AM   Specimen: BLOOD RIGHT ARM  Result Value Ref Range Status   Specimen Description BLOOD RIGHT ARM  Final   Special Requests   Final    BOTTLES DRAWN AEROBIC AND ANAEROBIC Blood Culture adequate volume   Culture   Final    NO GROWTH 1 DAY Performed at [facility], [address], [place], [place] [address]    Report Status PENDING  Incomplete  Urine culture     Status: None   Collection Time: 01/14/19  8:13 AM   Specimen: In/Out Cath Urine  Result Value Ref Range Status   Specimen Description   Final    IN/OUT CATH URINE Performed at [facility], [address] [place], [place] [address]    Special Requests   Final    NONE Performed at [facility], [address] [place], [place]  [address]    Culture   Final    Multiple bacterial morphotypes present, none predominant. Suggest appropriate recollection if clinically indicated.   Report Status 01/15/2019 FINAL  Final  Blood Culture (routine x 2)     Status: None (Preliminary result)   Collection Time: 01/14/19  8:18 AM   Specimen: BLOOD  Result Value Ref Range Status   Specimen Description   Final    BLOOD RIGHT ANTECUBITAL Performed at [facility], [address], [place], [place] [address]    Special Requests   Final    BOTTLES DRAWN AEROBIC AND ANAEROBIC Blood Culture adequate volume Performed at [facility], [address] [place], [place] [address]    Culture   Final    NO  GROWTH 1 DAY Performed at [facility], [place] [address], [place], [place] [address]    Report Status PENDING  Incomplete  SARS Coronavirus 2 [facility] order, Performed in [facility] hospital lab) Nasopharyngeal Nasopharyngeal Swab     Status: None   Collection Time: 01/14/19  1:55 PM   Specimen: Nasopharyngeal Swab  Result Value Ref Range Status   SARS Coronavirus 2 NEGATIVE NEGATIVE Final    Comment: (NOTE) If result is NEGATIVE SARS-CoV-2 target nucleic acids are NOT DETECTED. The SARS-CoV-2 RNA is generally detectable in upper and lower  respiratory specimens during the acute phase of infection. The lowest  concentration of SARS-CoV-2 viral copies this assay can detect is 250  copies / mL. A negative result does not preclude SARS-CoV-2 infection  and should not be used as the sole basis for treatment or other  patient management decisions.  A negative result may occur with  improper specimen collection / handling, submission of specimen other  than nasopharyngeal swab, presence of viral mutation(s) within the  areas targeted by this assay, and inadequate number of viral copies  (<250 copies / mL). A negative result must be combined with clinical  observations, patient history, and  epidemiological information. If result is POSITIVE SARS-CoV-2 target nucleic acids are DETECTED. The SARS-CoV-2 RNA is generally detectable in upper and lower  respiratory specimens dur ing the acute phase of infection.  Positive  results are indicative of active infection with SARS-CoV-2.  Clinical  correlation with patient history and other diagnostic information is  necessary to determine patient infection status.  Positive results do  not rule out bacterial infection or co-infection with other viruses. If result is PRESUMPTIVE POSTIVE SARS-CoV-2 nucleic acids MAY BE PRESENT.   A presumptive positive result was obtained on the submitted specimen  and confirmed on repeat testing.  While 2019 novel coronavirus  (SARS-CoV-2) nucleic acids may be present in the submitted sample  additional confirmatory testing may be necessary for epidemiological  and / or clinical management purposes  to differentiate between  SARS-CoV-2 and other Sarbecovirus currently known to infect humans.  If clinically indicated additional testing with an alternate test  methodology [phone]) is advised. The SARS-CoV-2 RNA is generally  detectable in upper and lower respiratory sp ecimens during the acute  phase of infection. The expected result is Negative. Fact Sheet for Patients:  StrictlyIdeas.no Fact Sheet for Healthcare Providers: BankingDealers.co.za This test is not yet approved or cleared by the [place] FDA and has been authorized for detection and/or diagnosis of SARS-CoV-2 by FDA under an Emergency Use Authorization (EUA).  This EUA will remain in effect (meaning this test can be used) for the duration of the COVID-19 declaration under Section 564(b)(1) of the Act, 21 U.S.C. section 360bbb-3(b)(1), unless the authorization is terminated or revoked sooner. Performed at [facility], [place] [address] [place], [place] [address]       Studies: Ct Abdomen Pelvis W Contrast  Result Date: 01/14/2019 CLINICAL DATA:  Abdominal distension. EXAM: CT ABDOMEN AND PELVIS WITH CONTRAST TECHNIQUE: Multidetector CT imaging of the abdomen and pelvis was performed using the standard protocol following bolus administration of intravenous contrast. CONTRAST:  129mL OMNIPAQUE IOHEXOL 300 MG/ML  SOLN COMPARISON:  CT scan of July 21, 2017. FINDINGS: Lower chest: No acute abnormality. Hepatobiliary: No focal liver abnormality is seen. No gallstones, gallbladder wall thickening, or biliary dilatation. Pancreas: Unremarkable. No pancreatic ductal dilatation or surrounding inflammatory changes. Spleen: Normal in size without focal abnormality.  Adrenals/Urinary Tract: Adrenal glands are unremarkable. Kidneys are normal, without renal calculi, focal lesion, or hydronephrosis. Mild urinary bladder distention is noted. Stomach/Bowel: Status post gastric surgery. There is no evidence of bowel obstruction or inflammation. The appendix is not visualized. Vascular/Lymphatic: Aortic atherosclerosis. No enlarged abdominal or pelvic lymph nodes. Reproductive: Uterus and bilateral adnexa are unremarkable. Other: No abdominal wall hernia or abnormality. No abdominopelvic ascites. Musculoskeletal: No acute or significant osseous findings. IMPRESSION: Mild urinary bladder distention is noted. No other acute abnormality seen in the abdomen or pelvis. Aortic Atherosclerosis (ICD10-I70.0). Electronically Signed   By: Lupita RaiderJames  Green Jr M.D.   On: 01/14/2019 13:09    Scheduled Meds: . enoxaparin (LOVENOX) injection  40 mg Subcutaneous Q24H  . metoprolol tartrate  5 mg Intravenous Q6H  . multivitamin with minerals  1 tablet Oral Daily  . potassium chloride  40 mEq Oral Once  . vitamin C  125 mg Oral Daily    Continuous Infusions: . sodium chloride 125 mL/hr at 01/16/19 1052  . potassium chloride 10 mEq (01/16/19 1053)     Joycelyn DasLaxman Meryem Haertel, MD  [facility]  01/16/2019

## 2019-01-16 NOTE — Progress Notes (Signed)
K 3.2 paged night coverage

## 2019-01-16 NOTE — Evaluation (Signed)
Physical Therapy Evaluation Patient Details Name: Latoya Cox MRN: [id number] DOB: 12/21/1965 Today's Date: 01/16/2019   History of Present Illness  53 y.o. female with medical history significant of DM2, gastric sleeve surgery and presented intractable nausea and vomiting and severe hypokalemia  Clinical Impression  Pt admitted with above diagnosis.  Pt currently with functional limitations due to the deficits listed below (see PT Problem List). Pt will benefit from skilled PT to increase their independence and safety with mobility to allow discharge to the venue listed below.  Pt assisted to bathroom and then ambulated in hallway.  Pt very unsteady and had LOB x4 requiring assist to correct/prevent fall.  Pt reports poor balance began on Wednesday prior to admission.  Pt not safe to return home without assist at this time.  Pt reports her spouse is on oxygen and not able to assist.  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     Follow Up Recommendations Home health PT;Supervision/Assistance - 24 hour    Equipment Recommendations  Rolling walker with 5" wheels    Recommendations for Other Services       Precautions / Restrictions Precautions Precautions: Fall      Mobility  Bed Mobility Overal bed mobility: Needs Assistance Bed Mobility: Supine to Sit     Supine to sit: Min guard;HOB elevated        Transfers Overall transfer level: Needs assistance Equipment used: Rolling walker (2 wheeled) Transfers: Sit to/from Stand Sit to Stand: Min assist         General transfer comment: verbal cues for hand placement, assist to rise, stabilize and control descent  Ambulation/Gait Ambulation/Gait assistance: Min assist Gait Distance (Feet): 200 Feet Assistive device: Rolling walker (2 wheeled) Gait Pattern/deviations:  Decreased stride length;Step-through pattern;Trunk flexed     General Gait Details: verbal cues for posture and RW positioning, cues for pt to push down on RW for support/stability as pt required assist for LOB x4 during session  Stairs            Wheelchair Mobility    Modified Rankin (Stroke Patients Only)       Balance Overall balance assessment: Needs assistance         Standing balance support: Bilateral upper extremity supported Standing balance-Leahy Scale: Poor               High level balance activites: Backward walking;Turns High Level Balance Comments: requiring assist for higher level balance activities due to LOB             Pertinent Vitals/Pain Pain Assessment: No/denies pain    Home Living Family/patient expects to be discharged to:: Private residence Living Arrangements: Spouse/significant other           Home Layout: Two level;Bed/bath upstairs Home Equipment: None      Prior Function Level of Independence: Independent               Hand Dominance        Extremity/Trunk Assessment        Lower Extremity Assessment Lower Extremity Assessment: Generalized weakness       Communication   Communication: No difficulties  Cognition Arousal/Alertness: Awake/alert Behavior During Therapy: WFL for tasks assessed/performed Overall Cognitive Status: Within Functional Limits for tasks assessed  General Comments      Exercises     Assessment/Plan    PT Assessment Patient needs continued PT services  PT Problem List Decreased strength;Decreased mobility;Decreased activity tolerance;Decreased balance;Decreased knowledge of use of DME       PT Treatment Interventions Gait training;DME instruction;Therapeutic exercise;Balance training;Functional mobility training;Therapeutic activities;Patient/family education;Stair training;Neuromuscular re-education    PT  Goals (Current goals can be found in the Care Plan section)  Acute Rehab PT Goals PT Goal Formulation: With patient Time For Goal Achievement: 01/23/19 Potential to Achieve Goals: Good    Frequency Min 3X/week   Barriers to discharge        Co-evaluation               AM-PAC PT "6 Clicks" Mobility  Outcome Measure Help needed turning from your back to your side while in a flat bed without using bedrails?: A Little Help needed moving from lying on your back to sitting on the side of a flat bed without using bedrails?: A Little Help needed moving to and from a bed to a chair (including a wheelchair)?: A Little Help needed standing up from a chair using your arms (e.g., wheelchair or bedside chair)?: A Little Help needed to walk in hospital room?: A Little Help needed climbing 3-5 steps with a railing? : A Lot 6 Click Score: 17    End of Session Equipment Utilized During Treatment: Gait belt Activity Tolerance: Patient tolerated treatment well Patient left: in chair;with call bell/phone within reach(pt aware not to get out of chair without assist for safety) Nurse Communication: Mobility status PT Visit Diagnosis: Other abnormalities of gait and mobility (R26.89);Unsteadiness on feet (R26.81)    Time: 2025-4270 PT Time Calculation (min) (ACUTE ONLY): 21 min   Charges:   PT Evaluation $PT Eval Low Complexity: Mobile, PT, DPT Acute Rehabilitation Services Office: [phone] Pager: [phone]  Trena Platt 01/16/2019, 2:54 PM

## 2019-01-16 NOTE — Progress Notes (Signed)
   01/16/19 2331  Vitals  BP (!) 160/109  MAP (mmHg) 124  BP Location Right Arm  BP Method Automatic  Patient Position (if appropriate) Lying  Pulse Rate (!) 123  MEWS Score  MEWS RR 0  MEWS Pulse 2  MEWS Systolic 0  MEWS LOC 0  MEWS Temp 0  MEWS Score 2  MEWS Score Color Yellow  Provider Notification  Provider Name/Title schorr  Date Provider Notified 01/16/19  Time Provider Notified 2335  Notification Type Page  Notification Reason Change in status (HR and BP)  Response See new orders  Date of Provider Response 01/16/19  Time of Provider Response 2340

## 2019-01-17 LAB — MAGNESIUM: Magnesium: 1.6 mg/dL — ABNORMAL LOW (ref 1.7–2.4)

## 2019-01-17 LAB — CBC
HCT: 45.1 % (ref 36.0–46.0)
Hemoglobin: 14.3 g/dL (ref 12.0–15.0)
MCH: 26.2 pg (ref 26.0–34.0)
MCHC: 31.7 g/dL (ref 30.0–36.0)
MCV: 82.6 fL (ref 80.0–100.0)
Platelets: 156 10*3/uL (ref 150–400)
RBC: 5.46 MIL/uL — ABNORMAL HIGH (ref 3.87–5.11)
RDW: 13.5 % (ref 11.5–15.5)
WBC: 8.5 10*3/uL (ref 4.0–10.5)
nRBC: 0 % (ref 0.0–0.2)

## 2019-01-17 LAB — BASIC METABOLIC PANEL
Anion gap: 9 (ref 5–15)
Anion gap: 14 (ref 5–15)
BUN: 12 mg/dL (ref 6–20)
BUN: 9 mg/dL (ref 6–20)
CO2: 24 mmol/L (ref 22–32)
CO2: 22 mmol/L (ref 22–32)
Calcium: 8.8 mg/dL — ABNORMAL LOW (ref 8.9–10.3)
Calcium: 8.7 mg/dL — ABNORMAL LOW (ref 8.9–10.3)
Chloride: 111 mmol/L (ref 98–111)
Chloride: 106 mmol/L (ref 98–111)
Creatinine, Ser: 0.67 mg/dL (ref 0.44–1.00)
Creatinine, Ser: 0.57 mg/dL (ref 0.44–1.00)
GFR calc Af Amer: >60 mL/min (ref >60)
GFR calc Af Amer: >60 mL/min (ref >60)
GFR calc non Af Amer: >60 mL/min (ref >60)
GFR calc non Af Amer: >60 mL/min (ref >60)
Glucose, Bld: 209 mg/dL — ABNORMAL HIGH (ref 70–99)
Glucose, Bld: 231 mg/dL — ABNORMAL HIGH (ref 70–99)
Potassium: 3.2 mmol/L — ABNORMAL LOW (ref 3.5–5.1)
Potassium: 3.2 mmol/L — ABNORMAL LOW (ref 3.5–5.1)
Sodium: 144 mmol/L (ref 135–145)
Sodium: 142 mmol/L (ref 135–145)

## 2019-01-17 MED ORDER — HYDRALAZINE HCL 25 MG PO TABS: 25.0000 mg | ORAL_TABLET | Freq: Three times a day (TID) | ORAL | 0 refills | Status: DC | PRN

## 2019-01-17 MED ORDER — AMLODIPINE BESYLATE 10 MG PO TABS
10.0000 mg | ORAL_TABLET | Freq: Every day | ORAL | Status: DC
  Administered 2019-01-17: 10 mg via ORAL
  Filled 2019-01-17: qty 1

## 2019-01-17 MED ORDER — MAGNESIUM OXIDE 400 (241.3 MG) MG PO TABS: 400.0000 mg | ORAL_TABLET | Freq: Two times a day (BID) | ORAL | 0 refills | Status: AC

## 2019-01-17 MED ORDER — SODIUM CHLORIDE 0.9 % IV SOLN
INTRAVENOUS | Status: DC | PRN
  Administered 2019-01-17: 1000 mL via INTRAVENOUS

## 2019-01-17 MED ORDER — ONDANSETRON HCL 4 MG PO TABS: 4.0000 mg | ORAL_TABLET | Freq: Four times a day (QID) | ORAL | 0 refills | Status: DC | PRN

## 2019-01-17 MED ORDER — AMLODIPINE BESYLATE 10 MG PO TABS: 10.0000 mg | ORAL_TABLET | Freq: Every day | ORAL | 1 refills | Status: DC

## 2019-01-17 NOTE — Progress Notes (Signed)
Patient's BP was 163/119 at 2102. Hydralazine 10 mg IV given at 2108. Will monitor.

## 2019-01-17 NOTE — Progress Notes (Signed)
Physical Therapy Treatment Patient Details Name: Latoya Cox MRN: [id number] DOB: 05/20/1965 Today's Date: 01/17/2019    History of Present Illness 53 y.o. female with medical history significant of DM2, gastric sleeve surgery and presented intractable nausea and vomiting and severe hypokalemia    PT Comments    Pt is progressing towards goals. She continues to demonstrate balance deficits requiring supervision during ambulation and use on RW for stability. Had pt perform dynamic balance activities in hallway with use of handrail for support. Pt with 1x LOB during side stepping. She was able to correct without assist. Patient would benefit from continued skilled PT to maximize functional independence and safety with mobility. Will continue to follow acutely.     Follow Up Recommendations  Home health PT;Supervision/Assistance - 24 hour     Equipment Recommendations  Rolling walker with 5" wheels    Recommendations for Other Services       Precautions / Restrictions Precautions Precautions: Fall Restrictions Weight Bearing Restrictions: No    Mobility  Bed Mobility Overal bed mobility: Needs Assistance Bed Mobility: Supine to Sit     Supine to sit: Min guard;HOB elevated        Transfers Overall transfer level: Needs assistance Equipment used: Rolling walker (2 wheeled) Transfers: Sit to/from Stand Sit to Stand: Min guard         General transfer comment: cues for hand placement with RW  Ambulation/Gait Ambulation/Gait assistance: Min guard Gait Distance (Feet): 150 Feet Assistive device: Rolling walker (2 wheeled) Gait Pattern/deviations: Decreased stride length;Step-through pattern;Trunk flexed;Drifts right/left     General Gait Details: VC for RW proximity. Pt staggering to L and R. She reports mild dizziness during ambulation. No assist required for balance but close min guard for safety.   Stairs             Wheelchair Mobility    Modified  Rankin (Stroke Patients Only)       Balance Overall balance assessment: Needs assistance         Standing balance support: Bilateral upper extremity supported Standing balance-Leahy Scale: Fair                              Cognition Arousal/Alertness: Awake/alert Behavior During Therapy: WFL for tasks assessed/performed Overall Cognitive Status: Within Functional Limits for tasks assessed                                        Exercises Other Exercises Other Exercises: Side steps at hand rail to the L and R. Minimal use of hand rail. 1 LOB. Pt able to recover with assist from hand rail. 20 ft Other Exercises: Tandem walking at hand rail; 20 ft Other Exercises: backwards walking at hand rail; 40 ft Other Exercises: Walking on balls of feet at hand rail; 20 ft    General Comments        Pertinent Vitals/Pain Pain Assessment: No/denies pain    Home Living                      Prior Function            PT Goals (current goals can now be found in the care plan section) Acute Rehab PT Goals PT Goal Formulation: With patient Time For Goal Achievement: 01/23/19 Potential to Achieve Goals: Good Progress towards PT  goals: Progressing toward goals    Frequency    Min 3X/week      PT Plan Current plan remains appropriate    Co-evaluation              AM-PAC PT "6 Clicks" Mobility   Outcome Measure  Help needed turning from your back to your side while in a flat bed without using bedrails?: A Little Help needed moving from lying on your back to sitting on the side of a flat bed without using bedrails?: A Little Help needed moving to and from a bed to a chair (including a wheelchair)?: A Little Help needed standing up from a chair using your arms (e.g., wheelchair or bedside chair)?: A Little Help needed to walk in hospital room?: A Little Help needed climbing 3-5 steps with a railing? : A Lot 6 Click Score: 17     End of Session Equipment Utilized During Treatment: Gait belt Activity Tolerance: Patient tolerated treatment well Patient left: in chair;with call bell/phone within reach(pt aware not to get out of chair without assist for safety) Nurse Communication: Mobility status PT Visit Diagnosis: Other abnormalities of gait and mobility (R26.89);Unsteadiness on feet (R26.81)     Time: 1021-1040 PT Time Calculation (min) (ACUTE ONLY): 19 min  Charges:  $Gait Training: 8-22 mins                     Kallie LocksHannah Lashea Goda, [place]PTA Pager [phone] Acute Rehab   Sheral ApleyHannah E Hunt Zajicek 01/17/2019, 12:07 PM

## 2019-01-17 NOTE — Progress Notes (Signed)
BP 156/107, HR 122. Hydralazine 10 mg IV given at 0612. Hr down to 94

## 2019-01-17 NOTE — TOC Initial Note (Signed)
Transition of Care [facility]) - Initial/Assessment Note    Patient Details  Name: Latoya Cox MRN: [id number] Date of Birth: 10-10-1965  Transition of Care (TOC) CM/SW Contact:    Joaquin Courts, RN Phone Number: 01/17/2019, 3:47 PM  Clinical Narrative:      Patient set up with [facility] for [place]. [employer] to deliver rolling walker with seat to bedside for home use.              Expected Discharge Plan: [place] Barriers to Discharge: No Barriers Identified   Patient Goals and CMS Choice        Expected Discharge Plan and Services Expected Discharge Plan: [place]   Discharge Planning Services: CM Consult Post Acute Care Choice: [place] arrangements for the past 2 months: Single Family Home Expected Discharge Date: 01/17/19               DME Arranged: [place] Rile rolling with seat DME Agency: [employer] Date DME Agency Contacted: 01/17/19 Time DME Agency Contacted: [phone] Representative spoke with at DME Agency: [place]: PT [place]: [place] Date [place]: 01/17/19 Time [place]: 43 Representative spoke with at [place]: [place] Arrangements/Services Living arrangements for the past 2 months: [place] with:: Spouse Patient language and need for interpreter reviewed:: Yes Do you feel safe going back to the place where you live?: Yes      Need for Family Participation in Patient Care: Yes (Comment) Care giver support system in place?: Yes (comment)   Criminal Activity/Legal Involvement Pertinent to Current Situation/Hospitalization: No - Comment as needed  Activities of Daily Living Home Assistive Devices/Equipment: Eyeglasses, CBG Meter ADL Screening (condition at time of admission) Patient's cognitive ability adequate to safely complete daily activities?: Yes Is the patient deaf or have difficulty hearing?: No Does the patient have  difficulty seeing, even when wearing glasses/contacts?: No Does the patient have difficulty concentrating, remembering, or making decisions?: No Patient able to express need for assistance with ADLs?: Yes Does the patient have difficulty dressing or bathing?: No Independently performs ADLs?: Yes (appropriate for developmental age) Does the patient have difficulty walking or climbing stairs?: Yes(SECONDARY TO WEAKNESS) Weakness of Legs: Both Weakness of Arms/Hands: Both  Permission Sought/Granted                  Emotional Assessment Appearance:: Appears stated age Attitude/Demeanor/Rapport: Engaged Affect (typically observed): Accepting Orientation: : Oriented to Place, Oriented to  Time, Oriented to Situation, Oriented to Self   Psych Involvement: No (comment)  Admission diagnosis:  Hypokalemia [E87.6] Intractable vomiting with nausea, unspecified vomiting type [R11.2] Patient Active Problem List   Diagnosis Date Noted  . Intractable nausea and vomiting 01/14/2019  . Lactic acidosis 01/14/2019  . QT prolongation 01/14/2019  . Suspected Covid-19 Virus Infection 12/11/2018  . Hx of completed stroke 08/28/2018  . Hypokalemia 10/18/2017  . Overweight (BMI 25.0-29.9) 05/14/2017  . Routine general medical examination at a health care facility 08/02/2016  . Neck fullness 01/27/2016  . Diabetes mellitus type 2 in obese ([place]) 05/17/2015  . Essential hypertension 05/17/2015  . Vitamin D deficiency 05/17/2015   PCP:  Hoyt Koch, MD Pharmacy:   [id number] - [place], [place] [address] Phone: [phone] Fax: [phone]     Social Determinants of Health (SDOH) Interventions    Readmission Risk Interventions No flowsheet  data found.

## 2019-01-17 NOTE — Progress Notes (Addendum)
Patient had one run of SVT at 2126 with HR at 151. Had sustained HR > 120. Order received for Lopressor 5 mg every 5 mins. Received first dose at 2252 but HR remained in the 120's. Second dose was given at 2317 but BP then became elevated. Had 1000cc NS bolus and stat EKG done. BMP also drawnCardizem 10 mg IV was given with good effect. HR now in the 90's. BP now 147/92. Will continue to monitor.

## 2019-01-17 NOTE — Discharge Summary (Signed)
Physician Discharge Summary  Latoya Cox WGN:[id number] DOB: [birth year] DOA: 01/14/2019  PCP: Myrlene Broker, MD  Admit date: 01/14/2019 Discharge date: 01/17/2019 Consultations: None Admitted From: home Disposition: home with [facility]  Discharge Diagnoses:  Principal Problem:   Intractable nausea and vomiting Active Problems:   Diabetes mellitus type 2 in obese [facility])   Essential hypertension   Hypokalemia   Lactic acidosis   QT prolongation   Hospital Course Summary: 53 y.o.femalewith medical history significant ofDM2, gastric sleeve surgery presented with nausea and vomitting and wasn't able to stop. She denied any sick contacts. She denied any aggravating or alleviating factors. She denied any recent illness. Patient became concerned when she couldn't control the vomitting and came to the ED.ED Course: Found to be hypokalemic, hyperglycemic, and hypertensive. Imaging was negative for acute abnormalities. EKG showed prolonged QTc at 575 ms. TRH was called for admission.  Intractable nausea and vomiting: Likely related to diabetic gastroparesis. Improved with conservative measures.  Reglan avoided due to prolonged QTc. Diet advanced as tolerated and patient  Tolerated solids for dinner last night and breakfast this morning. She did have an episode of vomiting early morning but felt better after antiemetics. She feels ready to go home. Will give oral zofran to take at home as needed. Abdomen benign on exam. Urine culture  negative so far.  LFTs within normal limit.  Lipase within normal limits.  Acute on chronic hypokalemia.  Patient received 100 mEq of potassium through IV on 9/16. Now on oral BID replacement. Potassium still 3.2 today. Received additional dose today. It appears that she is on 60 meq BID potassium replacement at baseline. Will also supplement oral magnesium for few days.   Prolonged QTc:  QT was prolonged on admission (575 msec) in the setting of  hypokalemia, but repeat serial EKGs show improved QTc at 460 ms. Magnesium level was within normal limits at 2.0.     Elevated lactic acid.  Likely secondary to volume depletion.  Improved after IV fluid hydration.  Essential HTN:on coreg at home, was receiving metoprolol iv when nauseous/vomiting. Resumed Coreg day prior to discharge. BP remained elevated overnight requiring several doses of IV antihypertensives. Added Norvasc and BP this afternoon better at 140/90.  PRN hydralazine added to discharge meds and parameters for use explained to patient in the event BP goes up again after discharge.  Weakness debility deconditioning. In the setting of poor oral intake/tolerance. Nursing staff reported concerns for generalized weakness. Seen by PT who recommended HH upon discharge. Need for 24 hour supervision in the immediate post hospitalization period explained. HH PT will f/u   Discharge Exam:  Vitals:   01/17/19 0703 01/17/19 1342  BP: (!) 154/91 (!) 148/97  Pulse: 94 87  Resp:  20  Temp:  97.8 F (36.6 C)  SpO2:  100%   Vitals:   01/17/19 0135 01/17/19 0537 01/17/19 0703 01/17/19 1342  BP: (!) 147/92 (!) 159/107 (!) 154/91 (!) 148/97  Pulse: 91 (!) 122 94 87  Resp: 16 16  20   Temp: 99.6 F (37.6 C) 99 F (37.2 C)  97.8 F (36.6 C)  TempSrc: Oral Oral  Oral  SpO2: 94% 98%  100%  Weight:      Height:        General: Pt is alert, awake, not in acute distress Cardiovascular: RRR, S1/S2 +, no rubs, no gallops Respiratory: CTA bilaterally, no wheezing, no rhonchi Abdominal: Soft, NT, ND, bowel sounds + Extremities: no edema, no cyanosis  Discharge Condition:Stable CODE STATUS: Full code Diet recommendation: low salt, diabetic Recommendations for Outpatient Follow-up:  1. Follow up with PCP:  5 days 2. Follow up with consultants:  3. Please obtain follow up labs including: BMP, mag level, EKG for Qtc f/u  Home Health services upon discharge: yes Equipment/Devices upon  discharge: Rolling Walker with wheels   Discharge Instructions:  Discharge Instructions    Call MD for:  difficulty breathing, headache or visual disturbances   Complete by: As directed    Call MD for:  extreme fatigue   Complete by: As directed    Call MD for:  persistant dizziness or light-headedness   Complete by: As directed    Call MD for:  persistant nausea and vomiting   Complete by: As directed    Call MD for:  severe uncontrolled pain   Complete by: As directed    Call MD for:  temperature >100.4   Complete by: As directed    Diet - low sodium heart healthy   Complete by: As directed    Increase activity slowly   Complete by: As directed      Allergies as of 01/17/2019      Reactions   Hydrocodone Itching   Pt states "if i take 2, it makes me itch"      Medication List    STOP taking these medications   PHENDIMETRAZINE TARTRATE PO     TAKE these medications   amLODipine 10 MG tablet Commonly known as: NORVASC Take 1 tablet (10 mg total) by mouth daily. Start taking on: January 18, 2019   carvedilol 25 MG tablet Commonly known as: COREG Take 1 tablet (25 mg total) by mouth 2 (two) times daily with a meal.   Clever Chek Lancets Misc Use daily to check sugars.   OneTouch Delica Lancets 33G Misc Use to help check blood sugars twice a day Dx E11.9   diclofenac 75 MG EC tablet Commonly known as: VOLTAREN TAKE 1 TABLET BY MOUTH TWICE A DAY   Diethylpropion HCl 25 MG Tabs Take 1 tablet by mouth every evening.   fluconazole 150 MG tablet Commonly known as: DIFLUCAN Take 1 tablet (150 mg total) by mouth every 3 (three) days. What changed: additional instructions   fluticasone 50 MCG/ACT nasal spray Commonly known as: FLONASE Place 1 spray daily as needed into both nostrils for allergies.   glucose blood test strip Commonly known as: Insurance account managerClever Choice Micro Test Use as instructed   glucose blood test strip Commonly known as: OneTouch Verio 1 each  by Other route 2 (two) times daily. Use to check blood sugars twice a day Dx E11.9   hydrALAZINE 25 MG tablet Commonly known as: APRESOLINE Take 1 tablet (25 mg total) by mouth 3 (three) times daily as needed (SBP >160, DBP>95).   Jardiance 25 MG Tabs tablet Generic drug: empagliflozin TAKE 1 TABLET BY MOUTH EVERY DAY What changed: how much to take   magnesium oxide 400 (241.3 Mg) MG tablet Commonly known as: MAG-OX Take 1 tablet (400 mg total) by mouth 2 (two) times daily for 7 days.   Multivitamin Adult Tabs Take 1 tablet by mouth daily.   nystatin-triamcinolone ointment Commonly known as: MYCOLOG APPLY TO AFFECTED AREA TWICE DAILY What changed: See the new instructions.   omeprazole 40 MG capsule Commonly known as: PRILOSEC TAKE 1 CAPSULE BY MOUTH EVERY DAY What changed: how much to take   ondansetron 4 MG tablet Commonly known as: ZOFRAN Take 1  tablet (4 mg total) by mouth every 6 (six) hours as needed for nausea.   pioglitazone 30 MG tablet Commonly known as: ACTOS TAKE 1 TABLET BY MOUTH EVERY DAY   potassium chloride SA 20 MEQ tablet Commonly known as: K-DUR Take 3 tablets (60 mEq total) by mouth daily.   SLEEP AID PO Take 1 tablet by mouth at bedtime.   vitamin C 100 MG tablet Take 100 mg by mouth daily.            Durable Medical Equipment  (From admission, onward)         Start     Ordered   01/17/19 1507  For home use only DME 4 wheeled rolling walker with seat  Once    Comments: Rolling walker with 5" Wheels  Question:  Patient needs a walker to treat with the following condition  Answer:  Muscular deconditioning   01/17/19 1507          Allergies  Allergen Reactions  . Hydrocodone Itching    Pt states "if i take 2, it makes me itch"      The results of significant diagnostics from this hospitalization (including imaging, microbiology, ancillary and laboratory) are listed below for reference.    Labs: BNP (last 3 results) No  results for input(s): BNP in the last 8760 hours. Basic Metabolic Panel: Recent Labs  Lab 01/14/19 0800 01/14/19 1843 01/15/19 0427 01/16/19 0352 01/16/19 2251 01/17/19 0343  NA 140 142 139 139 144 142  K 2.5* 2.6* 2.7* 3.2* 3.2* 3.2*  CL 101 102 107 112* 111 106  CO2 25 23 21* 20* 24 22  GLUCOSE 286* 208* 200* 161* 209* 231*  BUN 15 10 16 20 12 9   CREATININE 0.77 0.63 0.80 0.86 0.67 0.57  CALCIUM 9.2 8.9 8.5* 8.5* 8.8* 8.7*  MG 2.0 1.7  --  2.1  --  1.6*  PHOS  --   --   --  2.6  --   --    Liver Function Tests: Recent Labs  Lab 01/14/19 0800 01/15/19 0427  AST 23 20  ALT 21 17  ALKPHOS 78 62  BILITOT 1.3* 1.7*  PROT 7.9 6.6  ALBUMIN 4.5 3.7   Recent Labs  Lab 01/14/19 0800  LIPASE 42   No results for input(s): AMMONIA in the last 168 hours. CBC: Recent Labs  Lab 01/14/19 0800 01/14/19 1843 01/15/19 0427 01/16/19 0352 01/17/19 0343  WBC 11.2* 14.0* 16.3* 11.7* 8.5  HGB 13.8 14.1 13.8 12.8 14.3  HCT 45.7 45.3 44.0 41.5 45.1  MCV 86.4 84.4 84.3 85.9 82.6  PLT 192 203 188 170 156   Cardiac Enzymes: No results for input(s): CKTOTAL, CKMB, CKMBINDEX, TROPONINI in the last 168 hours. BNP: Invalid input(s): POCBNP CBG: Recent Labs  Lab 01/14/19 0942  GLUCAP 221*   D-Dimer No results for input(s): DDIMER in the last 72 hours. Hgb A1c Recent Labs    01/14/19 1843  HGBA1C 7.7*   Lipid Profile No results for input(s): CHOL, HDL, LDLCALC, TRIG, CHOLHDL, LDLDIRECT in the last 72 hours. Thyroid function studies No results for input(s): TSH, T4TOTAL, T3FREE, THYROIDAB in the last 72 hours.  Invalid input(s): FREET3 Anemia work up No results for input(s): VITAMINB12, FOLATE, FERRITIN, TIBC, IRON, RETICCTPCT in the last 72 hours. Urinalysis    Component Value Date/Time   COLORURINE YELLOW 01/14/2019 0813   APPEARANCEUR CLEAR 01/14/2019 0813   LABSPEC 1.015 01/14/2019 0813   PHURINE 7.5 01/14/2019 0813  GLUCOSEU >=500 (A) 01/14/2019 0813   HGBUR  NEGATIVE 01/14/2019 0813   BILIRUBINUR NEGATIVE 01/14/2019 0813   KETONESUR TRACE (A) 01/14/2019 0813   PROTEINUR 30 (A) 01/14/2019 0813   UROBILINOGEN 0.2 09/04/2014 1850   NITRITE NEGATIVE 01/14/2019 0813   LEUKOCYTESUR NEGATIVE 01/14/2019 0813   Sepsis Labs Invalid input(s): PROCALCITONIN,  WBC,  LACTICIDVEN Microbiology Recent Results (from the past 240 hour(s))  Blood Culture (routine x 2)     Status: None (Preliminary result)   Collection Time: 01/14/19  8:13 AM   Specimen: BLOOD RIGHT ARM  Result Value Ref Range Status   Specimen Description BLOOD RIGHT ARM  Final   Special Requests   Final    BOTTLES DRAWN AEROBIC AND ANAEROBIC Blood Culture adequate volume   Culture   Final    NO GROWTH 2 DAYS Performed at [facility], [address] [place], [place] [address]    Report Status PENDING  Incomplete  Urine culture     Status: None   Collection Time: 01/14/19  8:13 AM   Specimen: In/Out Cath Urine  Result Value Ref Range Status   Specimen Description   Final    IN/OUT CATH URINE Performed at [place] [address], [place], [place] [address]    Special Requests   Final    NONE Performed at [facility], [place] [address] [place], [place] [address]    Culture   Final    Multiple bacterial morphotypes present, none predominant. Suggest appropriate recollection if clinically indicated.   Report Status 01/15/2019 FINAL  Final  Blood Culture (routine x 2)     Status: None (Preliminary result)   Collection Time: 01/14/19  8:18 AM   Specimen: BLOOD  Result Value Ref Range Status   Specimen Description   Final    BLOOD RIGHT ANTECUBITAL Performed at [place] [address], [place], [place] [address]    Special Requests   Final    BOTTLES DRAWN AEROBIC AND ANAEROBIC Blood Culture adequate volume Performed at [place] [address], [place], [place] [address]    Culture    Final    NO GROWTH 2 DAYS Performed at [place] [address], [place], [place] [address]    Report Status PENDING  Incomplete  SARS Coronavirus 2 [facility] order, Performed in [facility] hospital lab) Nasopharyngeal Nasopharyngeal Swab     Status: None   Collection Time: 01/14/19  1:55 PM   Specimen: Nasopharyngeal Swab  Result Value Ref Range Status   SARS Coronavirus 2 NEGATIVE NEGATIVE Final    Comment: (NOTE) If result is NEGATIVE SARS-CoV-2 target nucleic acids are NOT DETECTED. The SARS-CoV-2 RNA is generally detectable in upper and lower  respiratory specimens during the acute phase of infection. The lowest  concentration of SARS-CoV-2 viral copies this assay can detect is 250  copies / mL. A negative result does not preclude SARS-CoV-2 infection  and should not be used as the sole basis for treatment or other  patient management decisions.  A negative result may occur with  improper specimen collection / handling, submission of specimen other  than nasopharyngeal swab, presence of viral mutation(s) within the  areas targeted by this assay, and inadequate number of viral copies  (<250 copies / mL). A negative result must be combined with clinical  observations, patient history, and epidemiological information. If result is POSITIVE SARS-CoV-2 target nucleic acids are DETECTED. The SARS-CoV-2  RNA is generally detectable in upper and lower  respiratory specimens dur ing the acute phase of infection.  Positive  results are indicative of active infection with SARS-CoV-2.  Clinical  correlation with patient history and other diagnostic information is  necessary to determine patient infection status.  Positive results do  not rule out bacterial infection or co-infection with other viruses. If result is PRESUMPTIVE POSTIVE SARS-CoV-2 nucleic acids MAY BE PRESENT.   A presumptive positive result was obtained on the submitted specimen  and confirmed on repeat testing.   While 2019 novel coronavirus  (SARS-CoV-2) nucleic acids may be present in the submitted sample  additional confirmatory testing may be necessary for epidemiological  and / or clinical management purposes  to differentiate between  SARS-CoV-2 and other Sarbecovirus currently known to infect humans.  If clinically indicated additional testing with an alternate test  methodology [phone]([id number]) is advised. The SARS-CoV-2 RNA is generally  detectable in upper and lower respiratory sp ecimens during the acute  phase of infection. The expected result is Negative. Fact Sheet for Patients:  BoilerBrush.com.cyhttps://www.fda.gov/media/136312/download Fact Sheet for Healthcare Providers: https://pope.com/https://www.fda.gov/media/136313/download This test is not yet approved or cleared by the [place] FDA and has been authorized for detection and/or diagnosis of SARS-CoV-2 by FDA under an Emergency Use Authorization (EUA).  This EUA will remain in effect (meaning this test can be used) for the duration of the COVID-19 declaration under Section 564(b)(1) of the Act, 21 U.S.C. section 360bbb-3(b)(1), unless the authorization is terminated or revoked sooner. Performed at [facility], [address] [place], [place] [address]     Procedures/Studies: Ct Abdomen Pelvis W Contrast  Result Date: 01/14/2019 CLINICAL DATA:  Abdominal distension. EXAM: CT ABDOMEN AND PELVIS WITH CONTRAST TECHNIQUE: Multidetector CT imaging of the abdomen and pelvis was performed using the standard protocol following bolus administration of intravenous contrast. CONTRAST:  100mL OMNIPAQUE IOHEXOL 300 MG/ML  SOLN COMPARISON:  CT scan of July 21, 2017. FINDINGS: Lower chest: No acute abnormality. Hepatobiliary: No focal liver abnormality is seen. No gallstones, gallbladder wall thickening, or biliary dilatation. Pancreas: Unremarkable. No pancreatic ductal dilatation or surrounding inflammatory changes. Spleen: Normal in size without focal  abnormality. Adrenals/Urinary Tract: Adrenal glands are unremarkable. Kidneys are normal, without renal calculi, focal lesion, or hydronephrosis. Mild urinary bladder distention is noted. Stomach/Bowel: Status post gastric surgery. There is no evidence of bowel obstruction or inflammation. The appendix is not visualized. Vascular/Lymphatic: Aortic atherosclerosis. No enlarged abdominal or pelvic lymph nodes. Reproductive: Uterus and bilateral adnexa are unremarkable. Other: No abdominal wall hernia or abnormality. No abdominopelvic ascites. Musculoskeletal: No acute or significant osseous findings. IMPRESSION: Mild urinary bladder distention is noted. No other acute abnormality seen in the abdomen or pelvis. Aortic Atherosclerosis (ICD10-I70.0). Electronically Signed   By: Lupita RaiderJames  Green Jr M.D.   On: 01/14/2019 13:09   Dg Abdomen Acute W/chest  Result Date: 01/14/2019 CLINICAL DATA:  Vomiting, nausea. EXAM: DG ABDOMEN ACUTE W/ 1V CHEST COMPARISON:  Radiographs of May 09, 2015. FINDINGS: There is no evidence of dilated bowel loops or free intraperitoneal air. Postsurgical changes are noted in the left upper quadrant. No radiopaque calculi or other significant radiographic abnormality is seen. Heart size and mediastinal contours are within normal limits. Both lungs are clear. IMPRESSION: No definite evidence of bowel obstruction or ileus. No acute cardiopulmonary disease. Electronically Signed   By: Lupita RaiderJames  Green Jr M.D.   On: 01/14/2019 09:43     Time coordinating discharge: Over 30 minutes  SIGNED:   Alessandra Bevels, MD  [facility] 01/17/2019, 3:08 PM Pager : [phone]

## 2019-01-17 NOTE — Progress Notes (Signed)
Discussed with patient discharge instructions, she verbalized agreement and understanding.  Patient to leave in private vehicle with all belongings to go home.   

## 2019-01-18 NOTE — Progress Notes (Signed)
CM received call from [facility] stating that patient's insurance in out of network and they would not be able to provide [facility] services as initially thought.  CM reached out to various area providers with the following results: [facility]: unable to service due to staffing [facility]: unabel to service due to staffing [facility]: out of network [facility]: out of network [facility]: out of network [facility]: uncertain if in network [facility]: will check and notifiy CM on Monday if in network and can provide care

## 2019-01-19 LAB — CULTURE, BLOOD (ROUTINE X 2)
Culture: NO GROWTH
Culture: NO GROWTH
Special Requests: ADEQUATE
Special Requests: ADEQUATE

## 2019-01-19 NOTE — Progress Notes (Signed)
CM received call from [facility] rep confirming that patient's insurance is out of network. CM called [employer] for assistance with locating an in network provider, according to [employer] three providers are in network [facility], [facility], and [facility]). [facility] is unale to provide services due to staffing, [facility] and [facility] both state that they are not in network with this particular plan. CM spoke with patient and informed her of this. Patient states she is doing well and getting around her home safely and does not need [place] services at this time.

## 2019-01-26 ENCOUNTER — Telehealth: Payer: Self-pay | Admitting: Internal Medicine

## 2019-01-26 MED ORDER — POTASSIUM CHLORIDE CRYS ER 20 MEQ PO TBCR: 60.0000 meq | EXTENDED_RELEASE_TABLET | Freq: Every day | ORAL | 1 refills | Status: DC

## 2019-01-26 NOTE — Telephone Encounter (Signed)
Patient would like a refill on her [place] medication that she received when she was in the hospital.  She would like to have it sent to her preferred pharmacy [facility] [address].

## 2019-01-26 NOTE — Telephone Encounter (Signed)
Refill sent. See meds.  

## 2019-02-11 ENCOUNTER — Ambulatory Visit (INDEPENDENT_AMBULATORY_CARE_PROVIDER_SITE_OTHER): Payer: Self-pay

## 2019-02-11 DIAGNOSIS — Z23 Encounter for immunization: Secondary | ICD-10-CM

## 2019-02-20 ENCOUNTER — Other Ambulatory Visit: Payer: Self-pay | Admitting: Internal Medicine

## 2019-03-17 ENCOUNTER — Other Ambulatory Visit: Payer: Self-pay | Admitting: Internal Medicine

## 2019-04-08 ENCOUNTER — Other Ambulatory Visit: Payer: Self-pay | Admitting: Internal Medicine

## 2019-05-15 ENCOUNTER — Telehealth: Payer: Self-pay | Admitting: Internal Medicine

## 2019-05-15 ENCOUNTER — Other Ambulatory Visit: Payer: Self-pay | Admitting: Internal Medicine

## 2019-05-15 MED ORDER — AMLODIPINE BESYLATE 10 MG PO TABS: 10.0000 mg | ORAL_TABLET | Freq: Every day | ORAL | 2 refills | Status: DC

## 2019-05-15 NOTE — Telephone Encounter (Signed)
Medication Refill - Medication:  amLODipine (NORVASC) 10 MG tablet  Has the patient contacted their pharmacy?  Yes advised to call office.   Preferred Pharmacy (with phone number or street name):  [id number] - [place], [place] - [address] Phone:  [phone]  Fax:  [phone]     Agent: Please be advised that RX refills may take up to 3 business days. We ask that you follow-up with your pharmacy.

## 2019-05-15 NOTE — Telephone Encounter (Signed)
  Notes to clinic:  previously prescribed by a different provider.    Requested Prescriptions  Pending Prescriptions Disp Refills   amLODipine (NORVASC) 10 MG tablet 30 tablet 0    Sig: Take 1 tablet (10 mg total) by mouth daily.      Cardiovascular:  Calcium Channel Blockers Failed - 05/15/2019  3:51 PM      Failed - Last BP in normal range    BP Readings from Last 1 Encounters:  01/17/19 (!) 148/97          Passed - Valid encounter within last 6 months    Recent Outpatient Visits           5 months ago Suspected Covid-19 Virus Infection   [facility] -Willis Modena, MD   8 months ago Essential hypertension   [facility] -Willis Modena, MD   11 months ago Acute sinusitis, recurrence not specified, unspecified location   [facility] -Shanna Cisco, Allyne Gee, FNP   1 year ago Routine general medical examination at a health care facility   [facility] -Willis Modena, MD   1 year ago Acute sinusitis, recurrence not specified, unspecified location   [facility] -Nila Nephew, Audie Box, NP

## 2019-05-15 NOTE — Telephone Encounter (Signed)
Copied from CRM [id number]. Topic: General - Inquiry >> May 15, 2019  3:47 PM Reggie Pile, [place] wrote: Reason for CRM: Patient called in stating she is needing an alternative to the jardiance as it is too expensive for her. Please advise.

## 2019-05-17 ENCOUNTER — Other Ambulatory Visit: Payer: Self-pay | Admitting: Internal Medicine

## 2019-05-18 NOTE — Telephone Encounter (Signed)
I called the pharmacy and spoke to [facility]. 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 I have called the patient and informed her of the situation. She stated that she would take her new insurance card up there because she did switch insurances. I stated after the pharmacy has entered the new info they can run the medication and let her know what the cost would be. Patient stated that if any additional issue arises she would call the office back to let us know.

## 2019-05-18 NOTE — Telephone Encounter (Signed)
Does this need PA? If not she can go to [employer] and download a coupon to use at pharmacy.

## 2019-05-29 ENCOUNTER — Telehealth: Payer: Self-pay

## 2019-05-29 NOTE — Telephone Encounter (Signed)
Call regarding assistance for Jardiance from [facility]. Unsuccessful faxing forms to provider. Fax clinical section for application to be processed. Call [phone] EXT [phone] Latoya Cox to help with process since she is unable to fax successfully.

## 2019-06-02 ENCOUNTER — Other Ambulatory Visit: Payer: Self-pay

## 2019-06-02 MED ORDER — JARDIANCE 25 MG PO TABS: 25.0000 mg | ORAL_TABLET | Freq: Every day | ORAL | 3 refills | Status: DC

## 2019-06-02 NOTE — Telephone Encounter (Signed)
Called pt, LVM.   Message below is unclear on what needs to be done if faxed forms were unsuccessful in being sent. Does she have the clinical forms that needs to be completed? Do I need to call?

## 2019-06-02 NOTE — Telephone Encounter (Signed)
Spoke to pt, I have given her the fax number and alternative POD fax number for [facility] to send the forms to. She stated she doesn't believe they had the correct fax number since they were running into issues trying to get those over to [place].

## 2019-06-02 NOTE — Telephone Encounter (Signed)
Forms have been received and given to PCP for review prior to being faxed back.

## 2019-06-04 NOTE — Telephone Encounter (Signed)
Called pt, LVM informing her that forms were faxed back this morning for medication.

## 2019-06-04 NOTE — Telephone Encounter (Signed)
Patient has called back in reference.

## 2019-07-25 ENCOUNTER — Other Ambulatory Visit: Payer: Self-pay | Admitting: Internal Medicine

## 2019-08-06 ENCOUNTER — Other Ambulatory Visit: Payer: Self-pay | Admitting: Internal Medicine

## 2019-08-17 ENCOUNTER — Telehealth: Payer: Self-pay

## 2019-08-17 NOTE — Telephone Encounter (Signed)
Received refill faxed request from pharmacy for statin therapy medication. Patient hasn't filled a statin therapy medication in the last 180 days

## 2019-08-17 NOTE — Telephone Encounter (Signed)
Due for well visit can discuss then.

## 2019-08-17 NOTE — Telephone Encounter (Signed)
Notified pharmacy that patient needs an appointment to be seen

## 2019-09-05 ENCOUNTER — Other Ambulatory Visit: Payer: Self-pay | Admitting: Internal Medicine

## 2019-09-27 ENCOUNTER — Other Ambulatory Visit: Payer: Self-pay | Admitting: Internal Medicine

## 2019-10-05 ENCOUNTER — Encounter: Payer: Self-pay | Admitting: Internal Medicine

## 2019-10-05 ENCOUNTER — Other Ambulatory Visit: Payer: Self-pay

## 2019-10-05 ENCOUNTER — Ambulatory Visit (INDEPENDENT_AMBULATORY_CARE_PROVIDER_SITE_OTHER): Payer: No Typology Code available for payment source | Admitting: Internal Medicine

## 2019-10-05 ENCOUNTER — Other Ambulatory Visit: Payer: Self-pay | Admitting: Internal Medicine

## 2019-10-05 DIAGNOSIS — L700 Acne vulgaris: Secondary | ICD-10-CM

## 2019-10-05 DIAGNOSIS — E669 Obesity, unspecified: Secondary | ICD-10-CM

## 2019-10-05 DIAGNOSIS — E1169 Type 2 diabetes mellitus with other specified complication: Secondary | ICD-10-CM

## 2019-10-05 DIAGNOSIS — L709 Acne, unspecified: Secondary | ICD-10-CM | POA: Insufficient documentation

## 2019-10-05 MED ORDER — BENZOYL PEROXIDE 10 % EX CREA: TOPICAL_CREAM | CUTANEOUS | 3 refills | Status: AC

## 2019-10-05 NOTE — Progress Notes (Signed)
   Subjective:   Patient ID: Latoya Cox, female    DOB: [birth year], 54 y.o.   MRN: [id number]  HPI The patient is a 54 YO female coming in for concerns about acne. Started in the last few months on the cheeks and forehead. She used to use marykay and now using clinical products from online and thinking about switching back. Using retinol 0.5% (she thinks this is name of product) the last 8 weeks which she is not sure is helping. Denies fevers or chills. Denies pain or itching.   Review of Systems  Constitutional: Negative.   HENT: Negative.   Eyes: Negative.   Respiratory: Negative for cough, chest tightness and shortness of breath.   Cardiovascular: Negative for chest pain, palpitations and leg swelling.  Gastrointestinal: Negative for abdominal distention, abdominal pain, constipation, diarrhea, nausea and vomiting.  Musculoskeletal: Negative.   Skin: Negative.        acne  Neurological: Negative.   Psychiatric/Behavioral: Negative.     Objective:  Physical Exam Constitutional:      Appearance: She is well-developed.  HENT:     Head: Normocephalic and atraumatic.     Comments: Acne on the cheeks bilateral and forehead with some scarring Cardiovascular:     Rate and Rhythm: Normal rate and regular rhythm.  Pulmonary:     Effort: Pulmonary effort is normal. No respiratory distress.     Breath sounds: Normal breath sounds. No wheezing or rales.  Abdominal:     General: Bowel sounds are normal. There is no distension.     Palpations: Abdomen is soft.     Tenderness: There is no abdominal tenderness. There is no rebound.  Musculoskeletal:     Cervical back: Normal range of motion.  Skin:    General: Skin is warm and dry.  Neurological:     Mental Status: She is alert and oriented to person, place, and time.     Coordination: Coordination normal.     Vitals:   10/05/19 0959  BP: 140/88  Pulse: 86  Temp: 98.7 F (37.1 C)  TempSrc: Oral  SpO2: 99%  Weight: 172 lb (78  kg)  Height: 5\' 2"  (1.575 m)    This visit occurred during the SARS-CoV-2 public health emergency.  Safety protocols were in place, including screening questions prior to the visit, additional usage of staff PPE, and extensive cleaning of exam room while observing appropriate contact time as indicated for disinfecting solutions.   Assessment & Plan:

## 2019-10-05 NOTE — Assessment & Plan Note (Signed)
Rx benzoyl peroxide 10% to use BID for several weeks. Needs follow up in several weeks for response and adjust therapy if needed.

## 2019-10-05 NOTE — Patient Instructions (Addendum)
We have sent in benzoyl peroxide to use on the face twice a day apply to face. Let sit for 2-3 minutes then rinse off.

## 2019-10-05 NOTE — Assessment & Plan Note (Signed)
Reminded she needs visit for assessment. Due to lateness we are unable to assess at today's visit.

## 2019-10-15 ENCOUNTER — Inpatient Hospital Stay (HOSPITAL_COMMUNITY)
Admission: EM | Admit: 2019-10-15 | Discharge: 2019-11-23 | DRG: 023 | Disposition: A | Discharge status: Skilled Nursing Facility | Payer: No Typology Code available for payment source | Attending: Internal Medicine | Admitting: Internal Medicine

## 2019-10-15 ENCOUNTER — Emergency Department (HOSPITAL_COMMUNITY): Payer: No Typology Code available for payment source

## 2019-10-15 ENCOUNTER — Other Ambulatory Visit: Payer: Self-pay

## 2019-10-15 ENCOUNTER — Encounter (HOSPITAL_COMMUNITY): Payer: Self-pay

## 2019-10-15 DIAGNOSIS — I615 Nontraumatic intracerebral hemorrhage, intraventricular: Principal | ICD-10-CM | POA: Diagnosis present

## 2019-10-15 DIAGNOSIS — E871 Hypo-osmolality and hyponatremia: Secondary | ICD-10-CM | POA: Diagnosis not present

## 2019-10-15 DIAGNOSIS — Z20822 Contact with and (suspected) exposure to covid-19: Secondary | ICD-10-CM | POA: Diagnosis present

## 2019-10-15 DIAGNOSIS — I493 Ventricular premature depolarization: Secondary | ICD-10-CM | POA: Diagnosis not present

## 2019-10-15 DIAGNOSIS — E870 Hyperosmolality and hypernatremia: Secondary | ICD-10-CM | POA: Diagnosis not present

## 2019-10-15 DIAGNOSIS — I429 Cardiomyopathy, unspecified: Secondary | ICD-10-CM | POA: Diagnosis present

## 2019-10-15 DIAGNOSIS — J9811 Atelectasis: Secondary | ICD-10-CM | POA: Diagnosis not present

## 2019-10-15 DIAGNOSIS — I161 Hypertensive emergency: Secondary | ICD-10-CM | POA: Diagnosis present

## 2019-10-15 DIAGNOSIS — N179 Acute kidney failure, unspecified: Secondary | ICD-10-CM | POA: Diagnosis not present

## 2019-10-15 DIAGNOSIS — E860 Dehydration: Secondary | ICD-10-CM | POA: Diagnosis not present

## 2019-10-15 DIAGNOSIS — E1169 Type 2 diabetes mellitus with other specified complication: Secondary | ICD-10-CM | POA: Diagnosis not present

## 2019-10-15 DIAGNOSIS — I6523 Occlusion and stenosis of bilateral carotid arteries: Secondary | ICD-10-CM | POA: Diagnosis present

## 2019-10-15 DIAGNOSIS — G8191 Hemiplegia, unspecified affecting right dominant side: Secondary | ICD-10-CM | POA: Diagnosis present

## 2019-10-15 DIAGNOSIS — I10 Essential (primary) hypertension: Secondary | ICD-10-CM | POA: Diagnosis present

## 2019-10-15 DIAGNOSIS — R531 Weakness: Secondary | ICD-10-CM | POA: Diagnosis not present

## 2019-10-15 DIAGNOSIS — E785 Hyperlipidemia, unspecified: Secondary | ICD-10-CM | POA: Diagnosis present

## 2019-10-15 DIAGNOSIS — G9341 Metabolic encephalopathy: Secondary | ICD-10-CM | POA: Diagnosis not present

## 2019-10-15 DIAGNOSIS — Z833 Family history of diabetes mellitus: Secondary | ICD-10-CM

## 2019-10-15 DIAGNOSIS — E875 Hyperkalemia: Secondary | ICD-10-CM | POA: Diagnosis not present

## 2019-10-15 DIAGNOSIS — Z823 Family history of stroke: Secondary | ICD-10-CM

## 2019-10-15 DIAGNOSIS — E669 Obesity, unspecified: Secondary | ICD-10-CM | POA: Diagnosis not present

## 2019-10-15 DIAGNOSIS — R4182 Altered mental status, unspecified: Secondary | ICD-10-CM | POA: Diagnosis not present

## 2019-10-15 DIAGNOSIS — I119 Hypertensive heart disease without heart failure: Secondary | ICD-10-CM | POA: Diagnosis present

## 2019-10-15 DIAGNOSIS — I610 Nontraumatic intracerebral hemorrhage in hemisphere, subcortical: Secondary | ICD-10-CM | POA: Diagnosis not present

## 2019-10-15 DIAGNOSIS — E1165 Type 2 diabetes mellitus with hyperglycemia: Secondary | ICD-10-CM | POA: Diagnosis present

## 2019-10-15 DIAGNOSIS — R000 Tachycardia, unspecified: Secondary | ICD-10-CM | POA: Diagnosis not present

## 2019-10-15 DIAGNOSIS — I951 Orthostatic hypotension: Secondary | ICD-10-CM | POA: Diagnosis not present

## 2019-10-15 DIAGNOSIS — Z8679 Personal history of other diseases of the circulatory system: Secondary | ICD-10-CM

## 2019-10-15 DIAGNOSIS — J9601 Acute respiratory failure with hypoxia: Secondary | ICD-10-CM | POA: Diagnosis not present

## 2019-10-15 DIAGNOSIS — E7800 Pure hypercholesterolemia, unspecified: Secondary | ICD-10-CM | POA: Diagnosis not present

## 2019-10-15 DIAGNOSIS — D72829 Elevated white blood cell count, unspecified: Secondary | ICD-10-CM | POA: Diagnosis not present

## 2019-10-15 DIAGNOSIS — Z6831 Body mass index (BMI) 31.0-31.9, adult: Secondary | ICD-10-CM

## 2019-10-15 DIAGNOSIS — Z751 Person awaiting admission to adequate facility elsewhere: Secondary | ICD-10-CM

## 2019-10-15 DIAGNOSIS — R29810 Facial weakness: Secondary | ICD-10-CM | POA: Diagnosis present

## 2019-10-15 DIAGNOSIS — I619 Nontraumatic intracerebral hemorrhage, unspecified: Secondary | ICD-10-CM | POA: Diagnosis present

## 2019-10-15 DIAGNOSIS — R488 Other symbolic dysfunctions: Secondary | ICD-10-CM | POA: Diagnosis not present

## 2019-10-15 DIAGNOSIS — I471 Supraventricular tachycardia: Secondary | ICD-10-CM | POA: Diagnosis not present

## 2019-10-15 DIAGNOSIS — D6489 Other specified anemias: Secondary | ICD-10-CM | POA: Diagnosis not present

## 2019-10-15 DIAGNOSIS — R400 Somnolence: Secondary | ICD-10-CM | POA: Diagnosis not present

## 2019-10-15 DIAGNOSIS — M1990 Unspecified osteoarthritis, unspecified site: Secondary | ICD-10-CM | POA: Diagnosis present

## 2019-10-15 DIAGNOSIS — Z8249 Family history of ischemic heart disease and other diseases of the circulatory system: Secondary | ICD-10-CM | POA: Diagnosis not present

## 2019-10-15 DIAGNOSIS — Z885 Allergy status to narcotic agent status: Secondary | ICD-10-CM

## 2019-10-15 DIAGNOSIS — Z8261 Family history of arthritis: Secondary | ICD-10-CM | POA: Diagnosis not present

## 2019-10-15 DIAGNOSIS — Z9884 Bariatric surgery status: Secondary | ICD-10-CM

## 2019-10-15 DIAGNOSIS — E876 Hypokalemia: Secondary | ICD-10-CM | POA: Diagnosis not present

## 2019-10-15 DIAGNOSIS — Z9889 Other specified postprocedural states: Secondary | ICD-10-CM | POA: Diagnosis not present

## 2019-10-15 DIAGNOSIS — G911 Obstructive hydrocephalus: Secondary | ICD-10-CM | POA: Diagnosis not present

## 2019-10-15 DIAGNOSIS — I639 Cerebral infarction, unspecified: Secondary | ICD-10-CM

## 2019-10-15 DIAGNOSIS — Z9911 Dependence on respirator [ventilator] status: Secondary | ICD-10-CM

## 2019-10-15 DIAGNOSIS — I6389 Other cerebral infarction: Secondary | ICD-10-CM | POA: Diagnosis not present

## 2019-10-15 DIAGNOSIS — R1310 Dysphagia, unspecified: Secondary | ICD-10-CM | POA: Diagnosis present

## 2019-10-15 DIAGNOSIS — G4710 Hypersomnia, unspecified: Secondary | ICD-10-CM | POA: Diagnosis not present

## 2019-10-15 DIAGNOSIS — Z7984 Long term (current) use of oral hypoglycemic drugs: Secondary | ICD-10-CM

## 2019-10-15 DIAGNOSIS — G4733 Obstructive sleep apnea (adult) (pediatric): Secondary | ICD-10-CM | POA: Diagnosis present

## 2019-10-15 DIAGNOSIS — Z79899 Other long term (current) drug therapy: Secondary | ICD-10-CM

## 2019-10-15 LAB — DIFFERENTIAL
Abs Immature Granulocytes: 0.03 10*3/uL (ref 0.00–0.07)
Basophils Absolute: 0.1 10*3/uL (ref 0.0–0.1)
Basophils Relative: 1 %
Eosinophils Absolute: 0.1 10*3/uL (ref 0.0–0.5)
Eosinophils Relative: 1 %
Immature Granulocytes: 0 %
Lymphocytes Relative: 18 %
Lymphs Abs: 1.8 10*3/uL (ref 0.7–4.0)
Monocytes Absolute: 0.4 10*3/uL (ref 0.1–1.0)
Monocytes Relative: 4 %
Neutro Abs: 7.6 10*3/uL (ref 1.7–7.7)
Neutrophils Relative %: 76 %

## 2019-10-15 LAB — APTT: aPTT: 25 seconds (ref 24–36)

## 2019-10-15 LAB — I-STAT CHEM 8, ED
BUN: 11 mg/dL (ref 6–20)
Calcium, Ion: 1.04 mmol/L — ABNORMAL LOW (ref 1.15–1.40)
Chloride: 101 mmol/L (ref 98–111)
Creatinine, Ser: 0.7 mg/dL (ref 0.44–1.00)
Glucose, Bld: 221 mg/dL — ABNORMAL HIGH (ref 70–99)
HCT: 46 % (ref 36.0–46.0)
Hemoglobin: 15.6 g/dL — ABNORMAL HIGH (ref 12.0–15.0)
Potassium: 2.8 mmol/L — ABNORMAL LOW (ref 3.5–5.1)
Sodium: 142 mmol/L (ref 135–145)
TCO2: 27 mmol/L (ref 22–32)

## 2019-10-15 LAB — I-STAT ARTERIAL BLOOD GAS, ED
Acid-Base Excess: 4 mmol/L — ABNORMAL HIGH (ref 0.0–2.0)
Bicarbonate: 27 mmol/L (ref 20.0–28.0)
Calcium, Ion: 1.19 mmol/L (ref 1.15–1.40)
HCT: 40 % (ref 36.0–46.0)
Hemoglobin: 13.6 g/dL (ref 12.0–15.0)
O2 Saturation: 95 %
Patient temperature: 98.8
Potassium: 3 mmol/L — ABNORMAL LOW (ref 3.5–5.1)
Sodium: 140 mmol/L (ref 135–145)
TCO2: 28 mmol/L (ref 22–32)
pCO2 arterial: 35.7 mmHg (ref 32.0–48.0)
pH, Arterial: 7.486 — ABNORMAL HIGH (ref 7.350–7.450)
pO2, Arterial: 72 mmHg — ABNORMAL LOW (ref 83.0–108.0)

## 2019-10-15 LAB — CBC
HCT: 45.9 % (ref 36.0–46.0)
Hemoglobin: 14.2 g/dL (ref 12.0–15.0)
MCH: 26.3 pg (ref 26.0–34.0)
MCHC: 30.9 g/dL (ref 30.0–36.0)
MCV: 85.2 fL (ref 80.0–100.0)
Platelets: 201 10*3/uL (ref 150–400)
RBC: 5.39 MIL/uL — ABNORMAL HIGH (ref 3.87–5.11)
RDW: 14 % (ref 11.5–15.5)
WBC: 9.9 10*3/uL (ref 4.0–10.5)
nRBC: 0 % (ref 0.0–0.2)

## 2019-10-15 LAB — COMPREHENSIVE METABOLIC PANEL
ALT: 17 U/L (ref 0–44)
AST: 19 U/L (ref 15–41)
Albumin: 4.3 g/dL (ref 3.5–5.0)
Alkaline Phosphatase: 78 U/L (ref 38–126)
Anion gap: 12 (ref 5–15)
BUN: 9 mg/dL (ref 6–20)
CO2: 25 mmol/L (ref 22–32)
Calcium: 9.2 mg/dL (ref 8.9–10.3)
Chloride: 102 mmol/L (ref 98–111)
Creatinine, Ser: 0.8 mg/dL (ref 0.44–1.00)
GFR calc Af Amer: >60 mL/min (ref >60)
GFR calc non Af Amer: >60 mL/min (ref >60)
Glucose, Bld: 218 mg/dL — ABNORMAL HIGH (ref 70–99)
Potassium: 2.8 mmol/L — ABNORMAL LOW (ref 3.5–5.1)
Sodium: 139 mmol/L (ref 135–145)
Total Bilirubin: 1.5 mg/dL — ABNORMAL HIGH (ref 0.3–1.2)
Total Protein: 7.5 g/dL (ref 6.5–8.1)

## 2019-10-15 LAB — SARS CORONAVIRUS 2 BY RT PCR (HOSPITAL ORDER, PERFORMED IN ~~LOC~~ HOSPITAL LAB): SARS Coronavirus 2: NEGATIVE

## 2019-10-15 LAB — LIPID PANEL
Cholesterol: 274 mg/dL — ABNORMAL HIGH (ref 0–200)
HDL: 64 mg/dL (ref >40)
LDL Cholesterol: 186 mg/dL — ABNORMAL HIGH (ref 0–99)
Total CHOL/HDL Ratio: 4.3 RATIO
Triglycerides: 118 mg/dL (ref <150)
VLDL: 24 mg/dL (ref 0–40)

## 2019-10-15 LAB — I-STAT BETA HCG BLOOD, ED (MC, WL, AP ONLY): I-stat hCG, quantitative: <5 m[IU]/mL (ref <5)

## 2019-10-15 LAB — CBG MONITORING, ED
Glucose-Capillary: 195 mg/dL — ABNORMAL HIGH (ref 70–99)
Glucose-Capillary: 182 mg/dL — ABNORMAL HIGH (ref 70–99)

## 2019-10-15 LAB — ETHANOL: Alcohol, Ethyl (B): <10 mg/dL (ref <10)

## 2019-10-15 LAB — PROTIME-INR
INR: 1 (ref 0.8–1.2)
Prothrombin Time: 13 seconds (ref 11.4–15.2)

## 2019-10-15 MED ORDER — POTASSIUM CHLORIDE 10 MEQ/100ML IV SOLN
10.0000 meq | INTRAVENOUS | Status: AC
  Administered 2019-10-15 (×2): 10 meq via INTRAVENOUS
  Filled 2019-10-15 (×2): qty 100

## 2019-10-15 MED ORDER — CLEVIDIPINE BUTYRATE 0.5 MG/ML IV EMUL
INTRAVENOUS | Status: AC
  Administered 2019-10-15: 2 mg/h via INTRAVENOUS
  Filled 2019-10-15: qty 50

## 2019-10-15 MED ORDER — SENNOSIDES-DOCUSATE SODIUM 8.6-50 MG PO TABS: 1.0000 | ORAL_TABLET | Freq: Two times a day (BID) | ORAL | Status: DC

## 2019-10-15 MED ORDER — STROKE: EARLY STAGES OF RECOVERY BOOK
Freq: Once | Status: DC
  Filled 2019-10-15: qty 1

## 2019-10-15 MED ORDER — AMLODIPINE BESYLATE 10 MG PO TABS: 10.0000 mg | ORAL_TABLET | Freq: Every day | ORAL | Status: DC

## 2019-10-15 MED ORDER — ACETAMINOPHEN 650 MG RE SUPP
650.0000 mg | RECTAL | Status: DC | PRN
  Administered 2019-10-21 – 2019-10-22 (×2): 650 mg via RECTAL
  Filled 2019-10-15 (×2): qty 1

## 2019-10-15 MED ORDER — INSULIN ASPART 100 UNIT/ML ~~LOC~~ SOLN
0.0000 [IU] | SUBCUTANEOUS | Status: DC
  Administered 2019-10-15 – 2019-10-16 (×2): 3 [IU] via SUBCUTANEOUS
  Administered 2019-10-16: 5 [IU] via SUBCUTANEOUS
  Administered 2019-10-16: 3 [IU] via SUBCUTANEOUS
  Administered 2019-10-16: 2 [IU] via SUBCUTANEOUS
  Administered 2019-10-16 (×2): 8 [IU] via SUBCUTANEOUS
  Administered 2019-10-17: 2 [IU] via SUBCUTANEOUS
  Administered 2019-10-17: 3 [IU] via SUBCUTANEOUS
  Administered 2019-10-17: 2 [IU] via SUBCUTANEOUS
  Administered 2019-10-17: 5 [IU] via SUBCUTANEOUS
  Administered 2019-10-17 – 2019-10-18 (×2): 2 [IU] via SUBCUTANEOUS
  Administered 2019-10-18 (×2): 3 [IU] via SUBCUTANEOUS
  Administered 2019-10-18 – 2019-10-19 (×2): 2 [IU] via SUBCUTANEOUS
  Administered 2019-10-19 (×3): 3 [IU] via SUBCUTANEOUS
  Administered 2019-10-19: 5 [IU] via SUBCUTANEOUS
  Administered 2019-10-19: 3 [IU] via SUBCUTANEOUS
  Administered 2019-10-20: 5 [IU] via SUBCUTANEOUS
  Administered 2019-10-20 (×2): 2 [IU] via SUBCUTANEOUS
  Administered 2019-10-20 – 2019-10-21 (×2): 3 [IU] via SUBCUTANEOUS
  Administered 2019-10-21: 2 [IU] via SUBCUTANEOUS
  Administered 2019-10-21: 5 [IU] via SUBCUTANEOUS
  Administered 2019-10-22 (×3): 2 [IU] via SUBCUTANEOUS
  Administered 2019-10-22 – 2019-10-23 (×8): 3 [IU] via SUBCUTANEOUS
  Administered 2019-10-24: 5 [IU] via SUBCUTANEOUS
  Administered 2019-10-24: 2 [IU] via SUBCUTANEOUS
  Administered 2019-10-24: 15 [IU] via SUBCUTANEOUS
  Administered 2019-10-24 (×3): 3 [IU] via SUBCUTANEOUS
  Administered 2019-10-25: 11 [IU] via SUBCUTANEOUS
  Administered 2019-10-25: 3 [IU] via SUBCUTANEOUS
  Administered 2019-10-25: 8 [IU] via SUBCUTANEOUS

## 2019-10-15 MED ORDER — HYDRALAZINE HCL 25 MG PO TABS: 25.0000 mg | ORAL_TABLET | Freq: Three times a day (TID) | ORAL | Status: DC | PRN

## 2019-10-15 MED ORDER — CARVEDILOL 12.5 MG PO TABS
25.0000 mg | ORAL_TABLET | Freq: Two times a day (BID) | ORAL | Status: DC
  Filled 2019-10-15: qty 2

## 2019-10-15 MED ORDER — ACETAMINOPHEN 160 MG/5ML PO SOLN
650.0000 mg | ORAL | Status: DC | PRN
  Administered 2019-10-18 – 2019-10-20 (×3): 650 mg
  Filled 2019-10-15 (×3): qty 20.3

## 2019-10-15 MED ORDER — ACETAMINOPHEN 325 MG PO TABS
650.0000 mg | ORAL_TABLET | ORAL | Status: DC | PRN
  Administered 2019-11-03: 650 mg via ORAL
  Filled 2019-10-15: qty 2

## 2019-10-15 MED ORDER — PANTOPRAZOLE SODIUM 40 MG IV SOLR
40.0000 mg | Freq: Every day | INTRAVENOUS | Status: DC
  Administered 2019-10-15 – 2019-10-23 (×9): 40 mg via INTRAVENOUS
  Filled 2019-10-15 (×9): qty 40

## 2019-10-15 MED ORDER — POTASSIUM CHLORIDE CRYS ER 20 MEQ PO TBCR: 20.0000 meq | EXTENDED_RELEASE_TABLET | Freq: Every day | ORAL | Status: DC

## 2019-10-15 MED ORDER — CLEVIDIPINE BUTYRATE 0.5 MG/ML IV EMUL
0.0000 mg/h | INTRAVENOUS | Status: DC
  Administered 2019-10-15 (×2): 8 mg/h via INTRAVENOUS
  Administered 2019-10-16: 16 mg/h via INTRAVENOUS
  Administered 2019-10-16: 6 mg/h via INTRAVENOUS
  Filled 2019-10-15: qty 50
  Filled 2019-10-15: qty 100
  Filled 2019-10-15: qty 50
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
## 2019-10-15 NOTE — H&P (Signed)
Neurology Admission History and Physical Examination  Referring Physician: Dr. Rush Landmark    Chief Complaint: Acute onset of right sided weakness with headache  HPI: Latoya Cox is an 54 y.o. female with DM, HTN and arthritis, [name] and depressed level of consciousness with garbled speech at home, after complaining of a headache earlier in the day. Husband told EMS that the patient had been complaining of a headache since the morning, but was with no deficits at that time. At 4:30 PM, she continued to complain about her headache and she also felt sleepy, so she took a nap. Her husband checked on her at 6:30 PM, noted the right sided weakness and depressed level of consciousness, and called EMS. On EMS arrival, they noted the same, as well as right facial droop. She vomited en route and continued to vomit intermittently on arrival to the ED. She was obtunded to somnolent and unable to answer questions intelligibly. STAT CT head was obtained, revealing an ICH originating from the left basal ganglila, extending into the left lateral, third and 4th ventricles, with a small amount of blood in the right lateral ventricle as well. Early stages of hydrocephalus were also noted on CT.   LSN: [id number] tPA Given: No: ICH ICH score: 2  Past Medical History:  Diagnosis Date  . Arthritis   . Chest wall pain   . Diabetes mellitus without complication (HCC)   . Hypertension     Past Surgical History:  Procedure Laterality Date  . aneurism repair    . lapband      Family History  Problem Relation Age of Onset  . Stroke Mother   . Hypertension Mother   . Arthritis Mother   . Diabetes Mother   . Arthritis Father   . Hypertension Father   . Diabetes Father    Social History:  reports that she has never smoked. She has never used smokeless tobacco. She reports that she does not drink alcohol and does not use drugs.  Allergies:  Allergies   Allergen Reactions  . Hydrocodone Itching    Pt states "if i take 2, it makes me itch"    Medications:  No current facility-administered medications on file prior to encounter.   Current Outpatient Medications on File Prior to Encounter  Medication Sig Dispense Refill  . amLODipine (NORVASC) 10 MG tablet TAKE 1 TABLET BY MOUTH EVERY DAY 30 tablet 2  . Ascorbic Acid (VITAMIN C) 100 MG tablet Take 100 mg by mouth daily.    . Benzoyl Peroxide 10 % CREA Use on face twice daily 141 g 3  . carvedilol (COREG) 25 MG tablet TAKE 1 TAB 2 TIMES DAILY WITH A MEAL. OVERDUE FOR ANNUAL APPT MUST SEE PROVIDER FOR FUTURE REFILLS 60 tablet 0  . CLEVER CHEK LANCETS MISC Use daily to check sugars. 100 each 11  . diclofenac (VOLTAREN) 75 MG EC tablet TAKE 1 TABLET BY MOUTH TWICE A DAY (Patient taking differently: Take 75 mg by mouth 2 (two) times daily. ) 60 tablet 11  . Diethylpropion HCl 25 MG TABS Take 1 tablet by mouth every evening.    . Doxylamine Succinate, Sleep, (SLEEP AID PO) Take 1 tablet by mouth at bedtime.    . empagliflozin (JARDIANCE) 25 MG TABS tablet Take 25 mg by mouth daily. 90 tablet 3  . fluconazole (DIFLUCAN) 150 MG tablet Take 1 tablet (150 mg total) by mouth every 3 (three)  days. (Patient taking differently: Take 150 mg by mouth every 3 (three) days. As needed if symptoms have not improved.) 5 tablet 0  . fluticasone (FLONASE) 50 MCG/ACT nasal spray Place 1 spray daily as needed into both nostrils for allergies. 16 g 11  . glucose blood (CLEVER CHOICE MICRO TEST) test strip Use as instructed 100 each 12  . glucose blood (ONETOUCH VERIO) test strip 1 each by Other route 2 (two) times daily. Use to check blood sugars twice a day Dx E11.9 100 each 3  . hydrALAZINE (APRESOLINE) 25 MG tablet Take 1 tablet (25 mg total) by mouth 3 (three) times daily as needed (SBP >160, DBP>95). 120 tablet 0  . KLOR-CON M20 20 MEQ tablet TAKE 3 TABLETS BY MOUTH DAILY (NEED OFFICE VISIT AND LABS) 60 tablet 0   . Multiple Vitamins-Minerals (MULTIVITAMIN ADULT) TABS Take 1 tablet by mouth daily.    Marland Kitchen nystatin-triamcinolone ointment (MYCOLOG) APPLY TO AFFECTED AREA TWICE DAILY (Patient taking differently: Apply 1 application topically 2 (two) times daily. ) 120 g 11  . omeprazole (PRILOSEC) 40 MG capsule TAKE 1 CAPSULE BY MOUTH EVERY DAY (Patient taking differently: Take 40 mg by mouth daily. ) 90 capsule 3  . ondansetron (ZOFRAN) 4 MG tablet Take 1 tablet (4 mg total) by mouth every 6 (six) hours as needed for nausea. 20 tablet 0  . ONETOUCH DELICA LANCETS 59D MISC Use to help check blood sugars twice a day Dx E11.9 100 each 3  . pioglitazone (ACTOS) 30 MG tablet TAKE 1 TABLET BY MOUTH EVERY DAY 90 tablet 1     ROS: Unable to obtain due to AMS  Physical Examination: There were no vitals taken for this visit.  HEENT: [place]/AT. Actively vomiting just prior to CT Lungs: Non-tachypneic Ext: No edema  Neurologic Examination: Mental Status: Somnolent to obtunded. Will attempt to answer questions but speech is incomprehensible and hypophonic. Not following commands. Does not appear to be aware of her acute medical condition.  Cranial Nerves: II:  No blink to threat on left or right. PERRL 3 mm >> 2 mm III,IV, VI: Eyes disconjugate. Does not track or fixate. Right eye at midline, left eye abducted.  V,VII: Right facial droop VIII: hearing intact to voice IX,X: Unable to visualize palate XI: No movement of right shoulder XII: Does not protrude tongue to command Motor: Flaccid RUE without movement to commands or any noxious stimuli.  No volitional movement of RLE. Will weakly dorsiflex right foot to plantar stimulation.  LUE and LLE with spontaneous and volitinal movements while localizing to sternal rub and attempting to reposition herself.  Sensory: No reaction to pinch RUE. Reacts weakly to plantar stimulation RLE. Normal responses to left sided stimuli.  Deep Tendon Reflexes:  Hypoactive reflexes  on the right.  Plantars: Right: Mute  Left: Downgoing Cerebellar/Gait: Unable to assess   Results for orders placed or performed during the hospital encounter of 10/15/19 (from the past 48 hour(s))  CBG monitoring, ED     Status: Abnormal   Collection Time: 10/15/19  7:17 PM  Result Value Ref Range   Glucose-Capillary 195 (H) 70 - 99 mg/dL    Comment: Glucose reference range applies only to samples taken after fasting for at least 8 hours.  I-stat chem 8, ED     Status: Abnormal   Collection Time: 10/15/19  7:23 PM  Result Value Ref Range   Sodium 142 135 - 145 mmol/L   Potassium 2.8 (L) 3.5 - 5.1 mmol/L  Chloride 101 98 - 111 mmol/L   BUN 11 6 - 20 mg/dL   Creatinine, Ser 8.18 0.44 - 1.00 mg/dL   Glucose, Bld 299 (H) 70 - 99 mg/dL    Comment: Glucose reference range applies only to samples taken after fasting for at least 8 hours.   Calcium, Ion 1.04 (L) 1.15 - 1.40 mmol/L   TCO2 27 22 - 32 mmol/L   Hemoglobin 15.6 (H) 12.0 - 15.0 g/dL   HCT 37.1 36 - 46 %   No results found.  Assessment: 54 y.o. female [name] and depressed level of consciousness with garbled speech at home, after complaining of a headache earlier in the day. 1. CT head reveals an acute intraparenchymal hemorrhage in the left thalamus, 9.6 cc. There is intraventricular penetration and early dilatation of the lateral ventricles. 2. The patient is somnolent to obtunded, with right hemiplegia on neurological exam. 3. Etiology for hemorrhage: Most likely a hypertensive bleed.  4. PMHx of DM, HTN and arthritis.   Plan: 1. Admitting to the ICU under the Neurology service.  2. MRI of head 3. PT consult, OT consult, Speech consult 4. Cardiac telemetry 5. Frequent neuro checks 6. Neurosurgery to evaluate for possible EVD 7. Repeat CT head in 6 hours 8. BP management with clevidipine drip  9. No antiplatelet medications or anticoagulants. DVT  prophylaxis with SCDs 10. SSI  50 minutes spent in the emergent neurological evaluation and management of this critically ill patient  @Electronically  signed: Dr.  10/15/2019, 7:26 PM

## 2019-10-16 ENCOUNTER — Inpatient Hospital Stay (HOSPITAL_COMMUNITY): Payer: No Typology Code available for payment source

## 2019-10-16 DIAGNOSIS — I161 Hypertensive emergency: Secondary | ICD-10-CM

## 2019-10-16 DIAGNOSIS — R4182 Altered mental status, unspecified: Secondary | ICD-10-CM

## 2019-10-16 DIAGNOSIS — I615 Nontraumatic intracerebral hemorrhage, intraventricular: Principal | ICD-10-CM

## 2019-10-16 DIAGNOSIS — Z9889 Other specified postprocedural states: Secondary | ICD-10-CM

## 2019-10-16 DIAGNOSIS — J9601 Acute respiratory failure with hypoxia: Secondary | ICD-10-CM

## 2019-10-16 DIAGNOSIS — R531 Weakness: Secondary | ICD-10-CM | POA: Diagnosis not present

## 2019-10-16 DIAGNOSIS — I6389 Other cerebral infarction: Secondary | ICD-10-CM

## 2019-10-16 DIAGNOSIS — G911 Obstructive hydrocephalus: Secondary | ICD-10-CM

## 2019-10-16 DIAGNOSIS — E876 Hypokalemia: Secondary | ICD-10-CM

## 2019-10-16 DIAGNOSIS — I610 Nontraumatic intracerebral hemorrhage in hemisphere, subcortical: Secondary | ICD-10-CM | POA: Diagnosis not present

## 2019-10-16 DIAGNOSIS — Z8679 Personal history of other diseases of the circulatory system: Secondary | ICD-10-CM

## 2019-10-16 DIAGNOSIS — E7800 Pure hypercholesterolemia, unspecified: Secondary | ICD-10-CM

## 2019-10-16 DIAGNOSIS — D72829 Elevated white blood cell count, unspecified: Secondary | ICD-10-CM

## 2019-10-16 LAB — URINALYSIS, ROUTINE W REFLEX MICROSCOPIC
Bacteria, UA: NONE SEEN
Bilirubin Urine: NEGATIVE
Glucose, UA: >=500 mg/dL — AB
Hgb urine dipstick: NEGATIVE
Ketones, ur: 20 mg/dL — AB
Leukocytes,Ua: NEGATIVE
Nitrite: NEGATIVE
Protein, ur: 30 mg/dL — AB
Specific Gravity, Urine: 1.03 (ref 1.005–1.030)
pH: 5 (ref 5.0–8.0)

## 2019-10-16 LAB — GLUCOSE, CAPILLARY
Glucose-Capillary: 126 mg/dL — ABNORMAL HIGH (ref 70–99)
Glucose-Capillary: 169 mg/dL — ABNORMAL HIGH (ref 70–99)
Glucose-Capillary: 177 mg/dL — ABNORMAL HIGH (ref 70–99)
Glucose-Capillary: 179 mg/dL — ABNORMAL HIGH (ref 70–99)
Glucose-Capillary: 221 mg/dL — ABNORMAL HIGH (ref 70–99)
Glucose-Capillary: 225 mg/dL — ABNORMAL HIGH (ref 70–99)
Glucose-Capillary: 258 mg/dL — ABNORMAL HIGH (ref 70–99)

## 2019-10-16 LAB — CBC
HCT: 50.8 % — ABNORMAL HIGH (ref 36.0–46.0)
Hemoglobin: 16 g/dL — ABNORMAL HIGH (ref 12.0–15.0)
MCH: 26.6 pg (ref 26.0–34.0)
MCHC: 31.5 g/dL (ref 30.0–36.0)
MCV: 84.5 fL (ref 80.0–100.0)
Platelets: 217 10*3/uL (ref 150–400)
RBC: 6.01 MIL/uL — ABNORMAL HIGH (ref 3.87–5.11)
RDW: 14.3 % (ref 11.5–15.5)
WBC: 24 10*3/uL — ABNORMAL HIGH (ref 4.0–10.5)
nRBC: 0 % (ref 0.0–0.2)

## 2019-10-16 LAB — RAPID URINE DRUG SCREEN, HOSP PERFORMED
Amphetamines: NOT DETECTED
Barbiturates: NOT DETECTED
Benzodiazepines: POSITIVE — AB
Cocaine: NOT DETECTED
Opiates: NOT DETECTED
Tetrahydrocannabinol: NOT DETECTED

## 2019-10-16 LAB — HEMOGLOBIN A1C
Hgb A1c MFr Bld: 9.1 % — ABNORMAL HIGH (ref 4.8–5.6)
Mean Plasma Glucose: 214 mg/dL

## 2019-10-16 LAB — BASIC METABOLIC PANEL
Anion gap: 22 — ABNORMAL HIGH (ref 5–15)
BUN: 11 mg/dL (ref 6–20)
CO2: 18 mmol/L — ABNORMAL LOW (ref 22–32)
Calcium: 9.6 mg/dL (ref 8.9–10.3)
Chloride: 103 mmol/L (ref 98–111)
Creatinine, Ser: 0.8 mg/dL (ref 0.44–1.00)
GFR calc Af Amer: >60 mL/min (ref >60)
GFR calc non Af Amer: >60 mL/min (ref >60)
Glucose, Bld: 193 mg/dL — ABNORMAL HIGH (ref 70–99)
Potassium: 3 mmol/L — ABNORMAL LOW (ref 3.5–5.1)
Sodium: 143 mmol/L (ref 135–145)

## 2019-10-16 LAB — MAGNESIUM: Magnesium: 2.1 mg/dL (ref 1.7–2.4)

## 2019-10-16 LAB — MRSA PCR SCREENING: MRSA by PCR: NEGATIVE

## 2019-10-16 LAB — T4, FREE: Free T4: 1.04 ng/dL (ref 0.61–1.12)

## 2019-10-16 LAB — TSH: TSH: 0.506 u[IU]/mL (ref 0.350–4.500)

## 2019-10-16 LAB — PHOSPHORUS: Phosphorus: 4.5 mg/dL (ref 2.5–4.6)

## 2019-10-16 LAB — ECHOCARDIOGRAM COMPLETE

## 2019-10-16 MED ORDER — ROCURONIUM BROMIDE 10 MG/ML (PF) SYRINGE
80.0000 mg | PREFILLED_SYRINGE | Freq: Once | INTRAVENOUS | Status: AC
  Administered 2019-10-16: 80 mg via INTRAVENOUS

## 2019-10-16 MED ORDER — PRO-STAT SUGAR FREE PO LIQD
60.0000 mL | Freq: Two times a day (BID) | ORAL | Status: DC
  Administered 2019-10-16 – 2019-10-21 (×10): 60 mL
  Filled 2019-10-16 (×10): qty 60

## 2019-10-16 MED ORDER — SODIUM CHLORIDE 0.9 % IV SOLN: INTRAVENOUS | Status: DC

## 2019-10-16 MED ORDER — PROPOFOL 1000 MG/100ML IV EMUL
0.0000 ug/kg/min | INTRAVENOUS | Status: DC
  Administered 2019-10-16: 20 ug/kg/min via INTRAVENOUS
  Administered 2019-10-16: 50 ug/kg/min via INTRAVENOUS
  Administered 2019-10-16: 40 ug/kg/min via INTRAVENOUS
  Filled 2019-10-16 (×6): qty 100

## 2019-10-16 MED ORDER — PERFLUTREN LIPID MICROSPHERE
1.0000 mL | INTRAVENOUS | Status: DC | PRN
  Administered 2019-10-16: 2 mL via INTRAVENOUS
  Filled 2019-10-16: qty 10

## 2019-10-16 MED ORDER — POLYETHYLENE GLYCOL 3350 17 G PO PACK
17.0000 g | PACK | Freq: Every day | ORAL | Status: DC
  Administered 2019-10-16 – 2019-10-19 (×4): 17 g
  Filled 2019-10-16 (×4): qty 1

## 2019-10-16 MED ORDER — VITAL HIGH PROTEIN PO LIQD
1000.0000 mL | ORAL | Status: DC
  Administered 2019-10-17 – 2019-10-20 (×4): 1000 mL

## 2019-10-16 MED ORDER — AMLODIPINE BESYLATE 10 MG PO TABS
10.0000 mg | ORAL_TABLET | Freq: Every day | ORAL | Status: DC
  Administered 2019-10-16 – 2019-10-21 (×6): 10 mg
  Filled 2019-10-16 (×7): qty 1

## 2019-10-16 MED ORDER — SENNOSIDES-DOCUSATE SODIUM 8.6-50 MG PO TABS
1.0000 | ORAL_TABLET | Freq: Two times a day (BID) | ORAL | Status: DC
  Administered 2019-10-16 – 2019-10-23 (×11): 1
  Filled 2019-10-16 (×13): qty 1

## 2019-10-16 MED ORDER — MIDAZOLAM HCL 2 MG/2ML IJ SOLN
INTRAMUSCULAR | Status: AC
  Administered 2019-10-16: 2 mg
  Filled 2019-10-16: qty 2

## 2019-10-16 MED ORDER — INSULIN ASPART 100 UNIT/ML ~~LOC~~ SOLN
3.0000 [IU] | SUBCUTANEOUS | Status: DC
  Administered 2019-10-16 – 2019-10-18 (×14): 3 [IU] via SUBCUTANEOUS

## 2019-10-16 MED ORDER — METOPROLOL TARTRATE 5 MG/5ML IV SOLN: 5.0000 mg | Freq: Once | INTRAVENOUS | Status: AC

## 2019-10-16 MED ORDER — INSULIN DETEMIR 100 UNIT/ML ~~LOC~~ SOLN
10.0000 [IU] | Freq: Two times a day (BID) | SUBCUTANEOUS | Status: DC
  Administered 2019-10-16 – 2019-10-31 (×30): 10 [IU] via SUBCUTANEOUS
  Filled 2019-10-16 (×33): qty 0.1

## 2019-10-16 MED ORDER — CARVEDILOL 12.5 MG PO TABS
25.0000 mg | ORAL_TABLET | Freq: Two times a day (BID) | ORAL | Status: DC
  Administered 2019-10-16 – 2019-10-21 (×10): 25 mg
  Filled 2019-10-16 (×9): qty 2

## 2019-10-16 MED ORDER — ETOMIDATE 2 MG/ML IV SOLN
20.0000 mg | Freq: Once | INTRAVENOUS | Status: AC
  Administered 2019-10-16: 20 mg via INTRAVENOUS

## 2019-10-16 MED ORDER — METOPROLOL TARTRATE 5 MG/5ML IV SOLN
INTRAVENOUS | Status: AC
  Administered 2019-10-16: 5 mg via INTRAVENOUS
  Filled 2019-10-16: qty 5

## 2019-10-16 MED ORDER — VITAL HIGH PROTEIN PO LIQD: 1000.0000 mL | ORAL | Status: DC

## 2019-10-16 MED ORDER — FENTANYL CITRATE (PF) 100 MCG/2ML IJ SOLN: 50.0000 ug | INTRAMUSCULAR | Status: DC | PRN

## 2019-10-16 MED ORDER — PROPOFOL 1000 MG/100ML IV EMUL
INTRAVENOUS | Status: AC
  Administered 2019-10-16: 10 ug/kg/min
  Filled 2019-10-16: qty 100

## 2019-10-16 MED ORDER — HEPARIN SODIUM (PORCINE) 5000 UNIT/ML IJ SOLN: 5000.0000 [IU] | Freq: Three times a day (TID) | INTRAMUSCULAR | Status: DC

## 2019-10-16 MED ORDER — CHLORHEXIDINE GLUCONATE CLOTH 2 % EX PADS
6.0000 | MEDICATED_PAD | Freq: Every day | CUTANEOUS | Status: DC
  Administered 2019-10-16 – 2019-10-21 (×6): 6 via TOPICAL

## 2019-10-16 MED ORDER — CHLORHEXIDINE GLUCONATE 0.12% ORAL RINSE (MEDLINE KIT)
15.0000 mL | Freq: Two times a day (BID) | OROMUCOSAL | Status: DC
  Administered 2019-10-16 – 2019-10-21 (×11): 15 mL via OROMUCOSAL

## 2019-10-16 MED ORDER — MIDAZOLAM HCL 2 MG/2ML IJ SOLN
4.0000 mg | Freq: Once | INTRAMUSCULAR | Status: AC
  Administered 2019-10-16: 4 mg via INTRAVENOUS

## 2019-10-16 MED ORDER — ORAL CARE MOUTH RINSE
15.0000 mL | OROMUCOSAL | Status: DC
  Administered 2019-10-16 – 2019-10-21 (×50): 15 mL via OROMUCOSAL

## 2019-10-16 MED ORDER — MIDAZOLAM HCL 2 MG/2ML IJ SOLN
INTRAMUSCULAR | Status: AC
  Filled 2019-10-16: qty 2

## 2019-10-16 MED ORDER — HYDRALAZINE HCL 25 MG PO TABS: 25.0000 mg | ORAL_TABLET | Freq: Three times a day (TID) | ORAL | Status: DC | PRN

## 2019-10-16 MED ORDER — DOCUSATE SODIUM 50 MG/5ML PO LIQD: 100.0000 mg | Freq: Two times a day (BID) | ORAL | Status: DC

## 2019-10-16 MED ORDER — FENTANYL CITRATE (PF) 100 MCG/2ML IJ SOLN
50.0000 ug | INTRAMUSCULAR | Status: DC | PRN
  Administered 2019-10-16 – 2019-10-19 (×5): 100 ug via INTRAVENOUS
  Filled 2019-10-16 (×8): qty 2

## 2019-10-16 MED ORDER — FENTANYL CITRATE (PF) 100 MCG/2ML IJ SOLN
INTRAMUSCULAR | Status: AC
  Administered 2019-10-16: 100 ug via INTRAVENOUS
  Filled 2019-10-16: qty 2

## 2019-10-16 MED ORDER — PRO-STAT SUGAR FREE PO LIQD: 30.0000 mL | Freq: Two times a day (BID) | ORAL | Status: DC

## 2019-10-16 MED ORDER — POLYETHYLENE GLYCOL 3350 17 G PO PACK: 17.0000 g | PACK | Freq: Every day | ORAL | Status: DC

## 2019-10-16 MED ORDER — FENTANYL CITRATE (PF) 100 MCG/2ML IJ SOLN: 100.0000 ug | Freq: Once | INTRAMUSCULAR | Status: AC

## 2019-10-16 MED ORDER — DOCUSATE SODIUM 50 MG/5ML PO LIQD
100.0000 mg | Freq: Two times a day (BID) | ORAL | Status: DC
  Administered 2019-10-16 – 2019-10-23 (×12): 100 mg
  Filled 2019-10-16 (×14): qty 10

## 2019-10-16 NOTE — Progress Notes (Signed)
Patient belongings on transfer to 4N: black shirt, pants, underwear: damp and laid out to dry in bathroom.  One steelers key. Curly red hair wig.

## 2019-10-16 NOTE — Progress Notes (Signed)
PT Cancellation Note  Patient Details Name: Latoya Cox MRN: [id number] DOB: 03-16-1966   Cancelled Treatment:    Reason Eval/Treat Not Completed: Patient not medically ready.  RN stated pt with new ventriculostomy.  Will see 6/19 as able. 10/16/2019  Jacinto Halim., PT Acute Rehabilitation Services [phone]  (pager) [phone]  (office)    Eliseo Gum Madigan Rosensteel 10/16/2019, 4:41 PM

## 2019-10-16 NOTE — Progress Notes (Signed)
Carotid duplex has been completed.   Preliminary results in CV Proc.   Blanch Media 10/16/2019 11:39 AM

## 2019-10-16 NOTE — Progress Notes (Addendum)
S/O: Follow up CT head from 0150 shows increased ventricular size relative to the CT at initial presentation:  1. No significant interval change in size and morphology of acute intraparenchymal hemorrhage emanating from the left thalamus, estimated volume 11 CC. Mildly increased localized edema with trace 5 mm localized left-to-right shift at the septum pellucidum. 2. Associated intraventricular extension with blood throughout the ventricular system, similar to previous. Associated obstructive hydrocephalus appears slightly worsened from previous. 3. No other new acute intracranial abnormality.  Exam: BP 136/81   Pulse (!) 110   Temp (!) 97.3 F (36.3 C) (Axillary)   Resp (!) 22   SpO2 100%    Ment: Worsened since prior exam. Now can be partially aroused for only a few seconds with sternal rub. Does not attempt to communicate with examiner after sternal rub, but will localize with LUE. Not following commands.  CN: PERRL. Not fixating or tracking. Right facial droop.  Motor/Sensory: Localizes with LUE. Moves LLE purposefully. Moves RUE and RLE nonpurposefully to noxious.  A/R: Left thalamic hemorrhage with intraventricular extension and worsening hydrocephalus -- Discussed the patient's condition with her son Shon Hale [phone]). He expressed desire to have EVD placed if Neursurgery feels that it is indicated. -- Neurosurgery has been contacted and plan is to place EVD -- Consulting CCM for the patient's fluctuating tachycardia. Also may need to be intubated.  20 minutes spent in the emergent neurological evaluation and management of this critically ill patient. Time spent included coordination of care and review of repeat CT scan images.  Electronically signed: Dr. Caryl Pina

## 2019-10-16 NOTE — Progress Notes (Signed)
STROKE TEAM PROGRESS NOTE   INTERVAL HISTORY TTE tech in the room. No family at bedside. Pt had EVD placed this morning with Dr. Vertell Limber. Currently still intubated, drowsy sleepy and barely open eyes on pain, not following commands, right side hemiparesis.    OBJECTIVE Vitals:   10/16/19 1400 10/16/19 1500 10/16/19 1549 10/16/19 1550  BP: 135/89 133/90 129/89   Pulse: 86 87 96   Resp: 18 18 19    Temp:      TempSrc:      SpO2: 100% 100% 100% 100%    CBC:  Recent Labs  Lab 10/15/19 1922 10/15/19 1923 10/15/19 1956 10/16/19 0548  WBC 9.9  --   --  24.0*  NEUTROABS 7.6  --   --   --   HGB 14.2   < > 13.6 16.0*  HCT 45.9   < > 40.0 50.8*  MCV 85.2  --   --  84.5  PLT 201  --   --  217   < > = values in this interval not displayed.    Basic Metabolic Panel:  Recent Labs  Lab 10/15/19 1922 10/15/19 1922 10/15/19 1923 10/15/19 1923 10/15/19 1956 10/16/19 0548  NA 139   < > 142   < > 140 143  K 2.8*   < > 2.8*   < > 3.0* 3.0*  CL 102   < > 101  --   --  103  CO2 25  --   --   --   --  18*  GLUCOSE 218*   < > 221*  --   --  193*  BUN 9   < > 11  --   --  11  CREATININE 0.80   < > 0.70  --   --  0.80  CALCIUM 9.2  --   --   --   --  9.6   < > = values in this interval not displayed.    Lipid Panel:     Component Value Date/Time   CHOL 274 (H) 10/15/2019 1949   TRIG 118 10/15/2019 1949   HDL 64 10/15/2019 1949   CHOLHDL 4.3 10/15/2019 1949   VLDL 24 10/15/2019 1949   LDLCALC 186 (H) 10/15/2019 1949   HgbA1c:  Lab Results  Component Value Date   HGBA1C 7.7 (H) 01/14/2019   Urine Drug Screen: No results found for: LABOPIA, COCAINSCRNUR, LABBENZ, AMPHETMU, THCU, LABBARB  Alcohol Level     Component Value Date/Time   ETH <10 10/15/2019 1922    IMAGING  CT HEAD WO CONTRAST 10/16/2019 IMPRESSION:  1. No significant interval change in size and morphology of acute intraparenchymal hemorrhage emanating from the left thalamus, estimated volume 11 CC. Mildly  increased localized edema with trace 5 mm localized left-to-right shift at the septum pellucidum.  2. Associated intraventricular extension with blood throughout the ventricular system, similar to previous. Associated obstructive hydrocephalus appears slightly worsened from previous.  3. No other new acute intracranial abnormality.   DG CHEST PORT 1 VIEW 10/16/2019 IMPRESSION: ET tube and NG tube in satisfactory position. Subsegmental atelectasis at the right lung base.   ECHOCARDIOGRAM COMPLETE 10/16/2019 IMPRESSIONS   1. Left ventricular ejection fraction, by estimation, is 60 to 65%. The left ventricle has normal function. The left ventricle has no regional wall motion abnormalities. There is mild concentric left ventricular hypertrophy. Left ventricular diastolic parameters are consistent with Grade I diastolic dysfunction (impaired relaxation).   2. Right ventricular systolic function is normal.  The right ventricular size is normal.   3. The mitral valve is normal in structure. No evidence of mitral valve regurgitation. No evidence of mitral stenosis.   4. The aortic valve is normal in structure. Aortic valve regurgitation is not visualized. No aortic stenosis is present.   5. Aortic dilatation noted. There is borderline dilatation of the aortic root.   6. The inferior vena cava is normal in size with greater than 50% respiratory variability, suggesting right atrial pressure of 3 mmHg.  CT HEAD CODE STROKE WO CONTRAST 10/15/2019 IMPRESSION:  1. Acute intraparenchymal hemorrhage in the left thalamus, 9.6 cc. Intraventricular penetration. Early dilatation of the lateral ventricles.  2. ASPECTS is 10   VAS US CAROTID 10/16/2019 Summary:  Right Carotid: Velocities in the right ICA are consistent with a 1-39% stenosis.  Left Carotid: Velocities in the left ICA are consistent with a 1-39% stenosis.  Vertebrals: Left vertebral artery demonstrates antegrade flow. Right vertebral artery was not  visualized.    ECG - ST rate 104 BPM. (See cardiology reading for complete details)  PHYSICAL EXAM  Temp:  [97.3 F (36.3 C)-99.5 F (37.5 C)] 99.3 F (37.4 C) (06/18 1200) Pulse Rate:  [80-152] 96 (06/18 1549) Resp:  [14-35] 19 (06/18 1549) BP: (89-201)/(61-156) 129/89 (06/18 1549) SpO2:  [68 %-100 %] 100 % (06/18 1550) FiO2 (%):  [60 %] 60 % (06/18 1550)  General - Well nourished, well developed, intubated on propofol.  Ophthalmologic - fundi not visualized due to noncooperation.  Cardiovascular - Regular rate and rhythm.  Neuro - intubated on sedation, eyes closed barely open on pain stimulation, not following commands. With forced eye opening, left eye in mid position, but right eye on lateral gaze position, not blinking to visual threat bilaterally, doll's eyes present, not tracking, PERRL. Corneal reflex weak bilaterally, gag and cough present. Breathing over the vent.  Facial symmetry not able to test due to ET tube.  Tongue protrusion not cooperative. Spontaneously moving left UE and LE and withdraw to pain, RUE and RLE also withdraw to pain but weaker than the left. DTR 1+ and no babinski. Sensation, coordination and gait not tested.    ASSESSMENT/PLAN [name] is a 54 y.o. female with DM, HTN and arthritis, presenting with acute onset of right sided weakness and depressed level of consciousness with garbled speech after complaining of a headache earlier in the day. tPA Given: No: ICH - ICH score: 2  ICH with IVH - left thalamic likely due to HTN  CT Head - Acute intraparenchymal hemorrhage in the left thalamus, 9.6 cc. Intraventricular penetration. Early dilatation of the lateral ventricles.     CT head - No significant interval change in size and morphology of acute intraparenchymal hemorrhage emanating from the left thalamus, estimated volume 11 CC. 5 mm MLS. IVH similar to previous. Associated obstructive hydrocephalus appears slightly worsened from previous.    MRI head - not able to perform due to previous aneurysm clips  CTA Head pending in am   Carotid Doppler - unremarkable  2D Echo - EF 60 - 65%. No cardiac source of emboli identified.  Sars Corona Virus 2  - negative  LDL - 186  HgbA1c - 7.7  UDS - pending  VTE prophylaxis - heparin subq  No antithrombotic prior to admission, now on No antithrombotic  Ongoing aggressive stroke risk factor management  Therapy recommendations:  pending  Disposition:  Pending  Obstructive hydrocephalus  CT repeat showed worsening obstructive hydrocephalus  Neurosurgery  on board  Status post EVD  Repeat CT in a.m.  History of aneurysm  As per husband, it was many years ago  Status post aneurysm clip  Clip not compatible with MRI due to no detailed information from husband  CTA head pending a.m.  Hypertensive emergency  Home BP meds: Norvasc ; Coreg ; Apresoline  Current BP meds: Norvasc ; Coreg   Off cleviprex  Stable now . SBP goal < 160 mm Hg . Long-term BP goal normotensive  Hyperlipidemia  Home Lipid lowering medication: none   LDL 186, goal < 70  Current lipid lowering medication: none for now due to ICH   Consider statin at discharge  Diabetes  Home diabetic meds: Jardiance ; Actos  Current diabetic meds: levemir  SSI  CBG monitoring  HgbA1c 7.7, goal < 7.0  Close PCP follow up  Dysphagia  Due to intubation the stroke  NPO  On tube feeding @ 20  On IV fluid @ 50  Speech on board  Other Stroke Risk Factors  Advanced age  Obesity, s/p bariatric sugery, recommend weight loss, diet and exercise as appropriate   Family hx stroke (mother)   Hx of cardiomyopathy  Other Active Problems  EVD placed today Dr Venetia Maxon  WBCs - 24K - temp 99.3  Hypokalemia 3.0 - replete and recheck  Leukocytosis WBC 9.9->24.0 - afebrile  Hospital day # 1  This patient is critically ill due to left thalamic ICH, IVH, obstructive hydrocephalus,  status post EVD, hypertensive emergency and at significant risk of neurological worsening, death form hematoma expansion, hydrocephalus, seizure, hypertensive encephalopathy, heart failure. This patient's care requires constant monitoring of vital signs, hemodynamics, respiratory and cardiac monitoring, review of multiple databases, neurological assessment, discussion with family, other specialists and medical decision making of high complexity. I spent 40 minutes of neurocritical care time in the care of this patient. I had long discussion with husband at bedside, updated pt current condition, treatment plan and potential prognosis, and answered all the questions.  He expressed understanding and appreciation.   Marvel Plan, MD PhD Stroke Neurology 10/16/2019 7:28 PM   To contact Stroke Continuity provider, please refer to [email or web address]. After hours, contact General Neurology

## 2019-10-16 NOTE — Progress Notes (Signed)
OT Cancellation Note  Patient Details Name: Latoya Cox MRN: [id number] DOB: 1966/03/30   Cancelled Treatment:    Reason Eval/Treat Not Completed: Medical issues which prohibited therapy (RN request to hold). Will follow up for OT eval as able.  Marcy Siren, OT Acute Rehabilitation Services Pager [phone] Office [phone]   Orlando Penner 10/16/2019, 10:16 AM

## 2019-10-16 NOTE — Progress Notes (Signed)
Pt has aneurysm clip. Per Radiologist, Dr. Phill Myron, we need more information on this clip in order to proceed with scan. No mention of clip during verbal screening last night 10/15/2019. Contact has to be made with him once more to inquire about implant (hospital/location and model number needed).

## 2019-10-16 NOTE — Progress Notes (Signed)
Seen and examined. Starting TF Starting SCDs Strengthening insulin regimen  Myrla Halsted MD PCCM

## 2019-10-16 NOTE — Procedures (Signed)
Endotracheal Intubation Procedure Note  Indication for endotracheal intubation:acute encephalopathy Airway Assessment: Mallampati Class: III (soft and hard palate and base of uvula visible). Sedation: etomidate. Paralytic: rocuronium. Lidocaine: no. Premedication: yes - fentanyl Equipment: VL glidescope s3 blade grade II view. Cricoid Pressure: no. Number of attempts: 1. ETT location confirmed by by auscultation and color change capnography.  Latoya Cox 10/16/2019

## 2019-10-16 NOTE — Consult Note (Addendum)
NAME:  Latoya Cox, MRN:  [id number], DOB:  02/24/1966, LOS: 1 ADMISSION DATE:  10/15/2019, CONSULTATION DATE:  10/16/19 REFERRING MD:  Otelia Limes, CHIEF COMPLAINT:  Acute encephalopathy  Brief History   54yF with acute encephalopathy from thalamic hemorrhage with intraventricular extension, worsening hydrocephalus requiring EVD and intubation this morning.   History of present illness   54yF with DM, HTN, arthritis who presented to ED 6/17 for R sided weakness and lethargy, change in speech after a headache earlier on day of admission. Much of history if obtained through chart review as she is unable to participate in interview at time of my exam due to her encephaloapthy. On arrival to EMS R facial droop also noted, some nausea and emesis. CTH showed ICH from left basal ganglia with extension into left lateral, third and 4th ventricles, with a small amount of blood in the right lateral ventricle as well, some early hydrocephalus.   On arrival to ICU she was started on cleviprex for SBP 100-140. Her exam deteriorated however and CTH showed worsening hydrocephalus. She has also had intermittent tachycardia which appears to be sinus on the EKGs available for review, including one with HR 146.  Past Medical History  DM HTN Arthritis  Significant Hospital Events   6/18 intubation 6/18 EVD placement  Consults:  PCCM Neurosurgery  Procedures:  6/18 intubation 6/18 EVD placement  Significant Diagnostic Tests:  [facility] 6/18 0150: obstructive hydrocephalus appears slightly worsened. 25mm left to right midline shift at septum pellucidum. No chagne in IPH size  Micro Data:  None available  Antimicrobials:  None  Interim history/subjective:  n/a  Objective   Blood pressure 124/84, pulse (!) 118, temperature (!) 97.3 F (36.3 C), temperature source Axillary, resp. rate (!) 23, SpO2 95 %.        Intake/Output Summary (Last 24 hours) at 10/16/2019 0510 Last data filed at 10/16/2019  0400 Gross per 24 hour  Intake 208.31 ml  Output --  Net 208.31 ml   There were no vitals filed for this visit.  Examination: General: grunts to loud voice, does not localize to painful stimuli, does not react to pressure behind mandible HENT: NCAT, dry MM Lungs: CTAB, normal work of breathing Cardiovascular: tachycardic, mostly regular rhythm, no murmur, no JVD  Abdomen: soft, nontender, normal bowel sounds Extremities: warm, well-perfused without cyanosis, edema Neuro: moves LUE but not purposefully, does not follow commands  Resolved Hospital Problem list   n/a  Assessment & Plan:   # acute hypoxic respiratory failure: intubated for airway protection - full vent support - intermittent fentanyl, propofol at least for early post intubation with rocuronium, rass -1  # IPH with worsening hydrocephalus: - now s/p EVD - cleviprex for SBP 100-140 per neuro - neuro following  # Tachycardia: Appears regular for the most part, intermittent PACs. She was briefly tachycardic to high 160s with hypertension post intubation even after fentanyl, etomidate. One time dose of metoprolol 5 mg IV given before EKG obtained. - tsh, free t4 - repeat EKG - f/u response to initiation of sedation and pain control    Best practice:  Diet: npo Pain/Anxiety/Delirium protocol (if indicated): yes VAP protocol (if indicated): yes DVT prophylaxis: holding, SCDs GI prophylaxis: not indicated Glucose control: SSI Mobility: bed level Code Status: Full Family Communication: To be updated in AM Disposition: ICU  Labs   CBC: Recent Labs  Lab 10/15/19 1922 10/15/19 1923 10/15/19 1956  WBC 9.9  --   --   NEUTROABS 7.6  --   --  HGB 14.2 15.6* 13.6  HCT 45.9 46.0 40.0  MCV 85.2  --   --   PLT 201  --   --     Basic Metabolic Panel: Recent Labs  Lab 10/15/19 1922 10/15/19 1923 10/15/19 1956  NA 139 142 140  K 2.8* 2.8* 3.0*  CL 102 101  --   CO2 25  --   --   GLUCOSE 218* 221*  --    BUN 9 11  --   CREATININE 0.80 0.70  --   CALCIUM 9.2  --   --    GFR: Estimated Creatinine Clearance: 79.6 mL/min (by C-G formula based on SCr of 0.7 mg/dL). Recent Labs  Lab 10/15/19 1922  WBC 9.9    Liver Function Tests: Recent Labs  Lab 10/15/19 1922  AST 19  ALT 17  ALKPHOS 78  BILITOT 1.5*  PROT 7.5  ALBUMIN 4.3   No results for input(s): LIPASE, AMYLASE in the last 168 hours. No results for input(s): AMMONIA in the last 168 hours.  ABG    Component Value Date/Time   PHART 7.486 (H) 10/15/2019 1956   PCO2ART 35.7 10/15/2019 1956   PO2ART 72 (L) 10/15/2019 1956   HCO3 27.0 10/15/2019 1956   TCO2 28 10/15/2019 1956   O2SAT 95.0 10/15/2019 1956     Coagulation Profile: Recent Labs  Lab 10/15/19 1922  INR 1.0    Cardiac Enzymes: No results for input(s): CKTOTAL, CKMB, CKMBINDEX, TROPONINI in the last 168 hours.  HbA1C: Hgb A1c MFr Bld  Date/Time Value Ref Range Status  01/14/2019 06:43 PM 7.7 (H) 4.8 - 5.6 % Final    Comment:    (NOTE)         Prediabetes: 5.7 - 6.4         Diabetes: >6.4         Glycemic control for adults with diabetes: <7.0   04/08/2018 04:19 PM 6.8 (H) 4.6 - 6.5 % Final    Comment:    Glycemic Control Guidelines for People with Diabetes:Non Diabetic:  <6%Goal of Therapy: <7%Additional Action Suggested:  >8%     CBG: Recent Labs  Lab 10/15/19 1917 10/15/19 2055 10/16/19 0343  GLUCAP 195* 182* 179*    Review of Systems:   unable to obtain due to encephalopathy  Past Medical History  She,  has a past medical history of Arthritis, Chest wall pain, Diabetes mellitus without complication (HCC), and Hypertension.   Surgical History    Past Surgical History:  Procedure Laterality Date  . aneurism repair    . lapband       Social History   reports that she has never smoked. She has never used smokeless tobacco. She reports that she does not drink alcohol and does not use drugs.   Family History   Her family  history includes Arthritis in her father and mother; Diabetes in her father and mother; Hypertension in her father and mother; Stroke in her mother.   Allergies Allergies  Allergen Reactions  . Hydrocodone Itching    Pt states "if i take 2, it makes me itch"     Home Medications  Prior to Admission medications   Medication Sig Start Date End Date Taking? Authorizing Provider  amLODipine (NORVASC) 10 MG tablet TAKE 1 TABLET BY MOUTH EVERY DAY 07/27/19   Myrlene Broker, MD  Ascorbic Acid (VITAMIN C) 100 MG tablet Take 100 mg by mouth daily.    [provider]  Benzoyl Peroxide  10 % CREA Use on face twice daily 10/05/19   Hoyt Koch, MD  carvedilol (COREG) 25 MG tablet TAKE 1 TAB 2 TIMES DAILY WITH A MEAL. OVERDUE FOR ANNUAL APPT MUST SEE PROVIDER FOR FUTURE REFILLS 10/05/19   Hoyt Koch, MD  CLEVER CHEK LANCETS MISC Use daily to check sugars. 08/30/15   Hoyt Koch, MD  diclofenac (VOLTAREN) 75 MG EC tablet TAKE 1 TABLET BY MOUTH TWICE A DAY Patient taking differently: Take 75 mg by mouth 2 (two) times daily.  10/30/18   Hoyt Koch, MD  Diethylpropion HCl 25 MG TABS Take 1 tablet by mouth every evening.    [provider]  Doxylamine Succinate, Sleep, (SLEEP AID PO) Take 1 tablet by mouth at bedtime.    [provider]  empagliflozin (JARDIANCE) 25 MG TABS tablet Take 25 mg by mouth daily. 06/02/19   Hoyt Koch, MD  fluconazole (DIFLUCAN) 150 MG tablet Take 1 tablet (150 mg total) by mouth every 3 (three) days. Patient taking differently: Take 150 mg by mouth every 3 (three) days. As needed if symptoms have not improved. 08/28/18   Hoyt Koch, MD  fluticasone Asencion Islam) 50 MCG/ACT nasal spray Place 1 spray daily as needed into both nostrils for allergies. 03/04/17   Hoyt Koch, MD  glucose blood (CLEVER CHOICE MICRO TEST) test strip Use as instructed 08/30/15   Hoyt Koch, MD  glucose  blood (ONETOUCH VERIO) test strip 1 each by Other route 2 (two) times daily. Use to check blood sugars twice a day Dx E11.9 01/04/16   Hoyt Koch, MD  hydrALAZINE (APRESOLINE) 25 MG tablet Take 1 tablet (25 mg total) by mouth 3 (three) times daily as needed (SBP >160, DBP>95). 01/17/19 02/16/19  Guilford Shi, MD  KLOR-CON M20 20 MEQ tablet TAKE 3 TABLETS BY MOUTH DAILY (NEED OFFICE VISIT AND LABS) 10/05/19   Hoyt Koch, MD  Multiple Vitamins-Minerals (MULTIVITAMIN ADULT) TABS Take 1 tablet by mouth daily.    [provider]  nystatin-triamcinolone ointment (MYCOLOG) APPLY TO AFFECTED AREA TWICE DAILY Patient taking differently: Apply 1 application topically 2 (two) times daily.  01/29/17   Hoyt Koch, MD  omeprazole (PRILOSEC) 40 MG capsule TAKE 1 CAPSULE BY MOUTH EVERY DAY Patient taking differently: Take 40 mg by mouth daily.  10/13/18   Hoyt Koch, MD  ondansetron (ZOFRAN) 4 MG tablet Take 1 tablet (4 mg total) by mouth every 6 (six) hours as needed for nausea. 01/17/19   Guilford Shi, MD  [facility] DELICA LANCETS 96Q MISC Use to help check blood sugars twice a day Dx E11.9 01/04/16   Hoyt Koch, MD  pioglitazone (ACTOS) 30 MG tablet TAKE 1 TABLET BY MOUTH EVERY DAY 05/18/19   Hoyt Koch, MD     Critical care time: 20 minutes    This patient is critically ill with acute encephalopathy from thalamic hemorrhage with intraventricular extension, worsening hydrocephalus requiring intubation; which, requires frequent high complexity decision making, assessment, support, evaluation, and titration of therapies. This was completed through the application of advanced monitoring technologies and extensive interpretation of multiple databases. During this encounter critical care time was devoted to patient care services described in this note for 20 minutes.  Walker Shadow PGY-5, Pulmonary/Critical Care

## 2019-10-16 NOTE — Progress Notes (Signed)
Initial Nutrition Assessment  RD working remotely.  DOCUMENTATION CODES:   Obesity unspecified  INTERVENTION:   Tube feeding via OG tube: - Vital High Protein @ 20 ml/hr (480 ml/day) - Pro-stat 60 ml BID  Tube feeding regimen provides 880 kcal, 102 grams of protein, and 401 ml of H2O.   Tube feeding regimen and current propofol provides 1374 total kcal (>100% of needs).  NUTRITION DIAGNOSIS:   Inadequate oral intake related to inability to eat as evidenced by NPO status.  GOAL:   Provide needs based on ASPEN/SCCM guidelines  MONITOR:   Vent status, Labs, Weight trends, TF tolerance, Skin, I & O's  REASON FOR ASSESSMENT:   Ventilator, Consult Enteral/tube feeding initiation and management  ASSESSMENT:   54 year old female who presented on 6/17 as a code stroke. PMH of DM and poorly controlled HTN. CT revealed ICH.   6/18 - worsening hydrocephalus, s/p EVD, intubated  Consult received for tube feeding initiation and management.  OG tube in place, currently clamped.  Patient is currently intubated on ventilator support MV: 7.3 L/min Temp (24hrs), Avg:98.7 F (37.1 C), Min:97.3 F (36.3 C), Max:99.5 F (37.5 C) BP (cuff): 99/79 MAP (cuff): 87  Drips: Propofol: 18.7 ml/hr (provides 494 kcal daily from lipid) Cleviprex: off this AM  Medications reviewed and include: colace, SSI q 4 hours, Novolog 3 units q 4 hours, Levemir 10 units BID, protonix, miralax, senna  Labs reviewed: potassium 3.0, cholesterol 274, LDL 186 CBG's: 179-258 x 24 hours  NUTRITION - FOCUSED PHYSICAL EXAM:  Unable to complete at this time. RD working remotely.  Diet Order:   Diet Order            Diet NPO time specified  Diet effective now                 EDUCATION NEEDS:   No education needs have been identified at this time  Skin:  Skin Assessment: Skin Integrity Issues: Incisions: head  Last BM:  no documented BM  Height:   Ht Readings from Last 1 Encounters:   10/05/19 5\' 2"  (1.575 m)    Weight:   Wt Readings from Last 1 Encounters:  10/05/19 78 kg    Ideal Body Weight:  50 kg  BMI:  31.45  Estimated Nutritional Needs:   Kcal:  12/05/19  Protein:  100-115 grams  Fluid:  >/= 1.2 L    [phone], MS, RD, LDN Inpatient Clinical Dietitian Pager: [phone] Weekend/After Hours: [phone]

## 2019-10-16 NOTE — ED Notes (Signed)
Shon Hale, husband, [phone] would like an update when available

## 2019-10-16 NOTE — Op Note (Signed)
Preop Dx: IVH/ICH/Hydrocephalus Postop Dx: Same Procedure: Left frontal IVC placement Surgeon: Venetia Maxon Anesthesia: Lidocaine and IV sedation with Propofol and Versed Complications: None  Patient has HCP and ICH and IVH with obtundation.  Left frontal scalp was prepped and draped after shaving this region.  Area of planned incision was infiltrated with lidocaine with epinephrine.  A stab incision was created and trephine made. The dura was incised and IVC drain was placed with brisk return of blood tinged CSF under slightly increased pressure.  The drain was tunneled, anchored and hooked to the Buretrol after incisions were closed with 3-0 Nylon sutures.  The wound was dressed with a sterile occlusive dressing.  The patient appeared to tolerate the procedure well.

## 2019-10-16 NOTE — Progress Notes (Signed)
Echocardiogram 2D Echocardiogram has been performed.  Latoya Cox 10/16/2019, 10:41 AM

## 2019-10-16 NOTE — Consult Note (Signed)
Reason for Consult: IVH/ICH/Hydrocephalus Referring Physician: Dr. Cheral Marker  HPI:  Latoya Cox is a 54 y.o. female with history of DM, HTN and arthritis, who presented to the ED on 10/15/19 via EMS after acute onset of right sided hemiparesis, confusion, AMS, garbled speech. She had c/o a headache earlier in the day yesterday but no other reported neuro deficits. At 4:30 PM, she continued to complain about her headache and she also felt sleepy, so she took a nap. Her husband checked on her at 6:30 PM, noted the right sided hemiparesis and decreased level of consciousness, and called EMS. On EMS arrival, they noted the same, as well as right facial droop. She vomited en route and continued to vomit intermittently on arrival to the ED. She was obtunded to somnolent and unable to answer questions intelligibly. STAT CT head was obtained, revealing an ICH originating from the left basal ganglila, extending into the left lateral, third and 4th ventricles, with a small amount of blood in the right lateral ventricle as well. Early stages of hydrocephalus were also noted on CT. Neurosurgical evaluation of the patient yesterday revealed the patient did not need any intervention and that time and stated to call us back if the patient had any neurological decline. Throughout the night the patient began to become obtunded and follow up CT head from 0150 showed increased ventricular size relative to the CT at initial presentation. Which subsequently led to neurosurgery being called and then the placing of an EVD. On neurosurgical arrival, the patient was being intubated by CCM for airway protection.  Past Medical History:  Diagnosis Date  . Arthritis   . Chest wall pain   . Diabetes mellitus without complication ([employer])   . Hypertension     Past Surgical History:  Procedure Laterality Date  . aneurism repair    . lapband      Family History  Problem Relation Age of Onset  . Stroke Mother   . Hypertension Mother    . Arthritis Mother   . Diabetes Mother   . Arthritis Father   . Hypertension Father   . Diabetes Father     Social History:  reports that she has never smoked. She has never used smokeless tobacco. She reports that she does not drink alcohol and does not use drugs.  Allergies:  Allergies  Allergen Reactions  . Hydrocodone Itching    Pt states "if i take 2, it makes me itch"    Medications: I have reviewed the patient's current medications.  Results for orders placed or performed during the hospital encounter of 10/15/19 (from the past 48 hour(s))  CBG monitoring, ED     Status: Abnormal   Collection Time: 10/15/19  7:17 PM  Result Value Ref Range   Glucose-Capillary 195 (H) 70 - 99 mg/dL    Comment: Glucose reference range applies only to samples taken after fasting for at least 8 hours.  I-Stat beta hCG blood, ED     Status: None   Collection Time: 10/15/19  7:21 PM  Result Value Ref Range   I-stat hCG, quantitative <5.0 <5 mIU/mL   Comment 3            Comment:   GEST. AGE      CONC.  (mIU/mL)   <=1 WEEK        5 - 50     2 WEEKS       50 - 500     3 WEEKS  100 - 10,000     4 WEEKS     1,000 - 30,000        FEMALE AND NON-PREGNANT FEMALE:     LESS THAN 5 mIU/mL   Ethanol     Status: None   Collection Time: 10/15/19  7:22 PM  Result Value Ref Range   Alcohol, Ethyl (B) <10 <10 mg/dL    Comment: (NOTE) Lowest detectable limit for serum alcohol is 10 mg/dL.  For medical purposes only. Performed at [facility], [address] [place], [place] [address]   Protime-INR     Status: None   Collection Time: 10/15/19  7:22 PM  Result Value Ref Range   Prothrombin Time 13.0 11.4 - 15.2 seconds   INR 1.0 0.8 - 1.2    Comment: (NOTE) INR goal varies based on device and disease states. Performed at [facility], [address] [place], [place] [address]   APTT     Status: None   Collection Time: 10/15/19  7:22 PM  Result Value Ref Range   aPTT 25  24 - 36 seconds    Comment: Performed at [facility], [address] [place], [place] [address]  CBC     Status: Abnormal   Collection Time: 10/15/19  7:22 PM  Result Value Ref Range   WBC 9.9 4.0 - 10.5 K/uL   RBC 5.39 (H) 3.87 - 5.11 MIL/uL   Hemoglobin 14.2 12.0 - 15.0 g/dL   HCT 91.445.9 36 - 46 %   MCV 85.2 80.0 - 100.0 fL   MCH 26.3 26.0 - 34.0 pg   MCHC 30.9 30.0 - 36.0 g/dL   RDW 78.214.0 95.611.5 - 21.315.5 %   Platelets 201 150 - 400 K/uL   nRBC 0.0 0.0 - 0.2 %    Comment: Performed at [facility], [address] [place], [place] [address]  Differential     Status: None   Collection Time: 10/15/19  7:22 PM  Result Value Ref Range   Neutrophils Relative % 76 %   Neutro Abs 7.6 1.7 - 7.7 K/uL   Lymphocytes Relative 18 %   Lymphs Abs 1.8 0.7 - 4.0 K/uL   Monocytes Relative 4 %   Monocytes Absolute 0.4 0 - 1 K/uL   Eosinophils Relative 1 %   Eosinophils Absolute 0.1 0 - 0 K/uL   Basophils Relative 1 %   Basophils Absolute 0.1 0 - 0 K/uL   Immature Granulocytes 0 %   Abs Immature Granulocytes 0.03 0.00 - 0.07 K/uL    Comment: Performed at [facility], [address] [place], [place] [address]  Comprehensive metabolic panel     Status: Abnormal   Collection Time: 10/15/19  7:22 PM  Result Value Ref Range   Sodium 139 135 - 145 mmol/L   Potassium 2.8 (L) 3.5 - 5.1 mmol/L   Chloride 102 98 - 111 mmol/L   CO2 25 22 - 32 mmol/L   Glucose, Bld 218 (H) 70 - 99 mg/dL    Comment: Glucose reference range applies only to samples taken after fasting for at least 8 hours.   BUN 9 6 - 20 mg/dL   Creatinine, Ser 2.950.80 0.44 - 1.00 mg/dL   Calcium 9.2 8.9 - 28.410.3 mg/dL   Total Protein 7.5 6.5 - 8.1 g/dL   Albumin 4.3 3.5 - 5.0 g/dL   AST 19 15 - 41 U/L   ALT 17 0 -  44 U/L   Alkaline Phosphatase 78 38 - 126 U/L   Total Bilirubin 1.5 (H) 0.3 - 1.2 mg/dL   GFR calc non Af Amer >60 >60 mL/min   GFR calc Af Amer >60 >60 mL/min   Anion gap 12 5 - 15    Comment: Performed at  [facility], [address], [place], [place] [address]  I-stat chem 8, ED     Status: Abnormal   Collection Time: 10/15/19  7:23 PM  Result Value Ref Range   Sodium 142 135 - 145 mmol/L   Potassium 2.8 (L) 3.5 - 5.1 mmol/L   Chloride 101 98 - 111 mmol/L   BUN 11 6 - 20 mg/dL   Creatinine, Ser 1.77 0.44 - 1.00 mg/dL   Glucose, Bld 939 (H) 70 - 99 mg/dL    Comment: Glucose reference range applies only to samples taken after fasting for at least 8 hours.   Calcium, Ion 1.04 (L) 1.15 - 1.40 mmol/L   TCO2 27 22 - 32 mmol/L   Hemoglobin 15.6 (H) 12.0 - 15.0 g/dL   HCT 03.0 36 - 46 %  Lipid panel     Status: Abnormal   Collection Time: 10/15/19  7:49 PM  Result Value Ref Range   Cholesterol 274 (H) 0 - 200 mg/dL   Triglycerides 092 <330 mg/dL   HDL 64 >07 mg/dL   Total CHOL/HDL Ratio 4.3 RATIO   VLDL 24 0 - 40 mg/dL   LDL Cholesterol 622 (H) 0 - 99 mg/dL    Comment:        Total Cholesterol/HDL:CHD Risk Coronary Heart Disease Risk Table                     Men   Women  1/2 Average Risk   3.4   3.3  Average Risk       5.0   4.4  2 X Average Risk   9.6   7.1  3 X Average Risk  23.4   11.0        Use the calculated Patient Ratio above and the CHD Risk Table to determine the patient's CHD Risk.        ATP III CLASSIFICATION (LDL):  <100     mg/dL   Optimal  633-354  mg/dL   Near or Above                    Optimal  130-159  mg/dL   Borderline  562-563  mg/dL   High  >893     mg/dL   Very High Performed at [facility], [address], [place], [place] [address]   I-Stat arterial blood gas, ED     Status: Abnormal   Collection Time: 10/15/19  7:56 PM  Result Value Ref Range   pH, Arterial 7.486 (H) 7.35 - 7.45   pCO2 arterial 35.7 32 - 48 mmHg   pO2, Arterial 72 (L) 83 - 108 mmHg   Bicarbonate 27.0 20.0 - 28.0 mmol/L   TCO2 28 22 - 32 mmol/L   O2 Saturation 95.0 %   Acid-Base Excess 4.0 (H) 0.0 - 2.0 mmol/L   Sodium 140 135 - 145 mmol/L   Potassium 3.0 (L)  3.5 - 5.1 mmol/L   Calcium, Ion 1.19 1.15 - 1.40 mmol/L   HCT 40.0 36 - 46 %   Hemoglobin 13.6 12.0 - 15.0 g/dL   Patient temperature 76.8 F  Collection site Radial    Drawn by RT    Sample type ARTERIAL   SARS Coronavirus 2 by RT PCR (hospital order, performed in [facility] hospital lab) Nasopharyngeal Nasopharyngeal Swab     Status: None   Collection Time: 10/15/19  7:59 PM   Specimen: Nasopharyngeal Swab  Result Value Ref Range   SARS Coronavirus 2 NEGATIVE NEGATIVE    Comment: (NOTE) SARS-CoV-2 target nucleic acids are NOT DETECTED.  The SARS-CoV-2 RNA is generally detectable in upper and lower respiratory specimens during the acute phase of infection. The lowest concentration of SARS-CoV-2 viral copies this assay can detect is 250 copies / mL. A negative result does not preclude SARS-CoV-2 infection and should not be used as the sole basis for treatment or other patient management decisions.  A negative result may occur with improper specimen collection / handling, submission of specimen other than nasopharyngeal swab, presence of viral mutation(s) within the areas targeted by this assay, and inadequate number of viral copies (<250 copies / mL). A negative result must be combined with clinical observations, patient history, and epidemiological information.  Fact Sheet for Patients:   BoilerBrush.com.cy  Fact Sheet for Healthcare Providers: https://pope.com/  This test is not yet approved or  cleared by the [place] FDA and has been authorized for detection and/or diagnosis of SARS-CoV-2 by FDA under an Emergency Use Authorization (EUA).  This EUA will remain in effect (meaning this test can be used) for the duration of the COVID-19 declaration under Section 564(b)(1) of the Act, 21 U.S.C. section 360bbb-3(b)(1), unless the authorization is terminated or revoked sooner.  Performed at [facility], [address], [place], [place] [address]   CBG monitoring, ED     Status: Abnormal   Collection Time: 10/15/19  8:55 PM  Result Value Ref Range   Glucose-Capillary 182 (H) 70 - 99 mg/dL    Comment: Glucose reference range applies only to samples taken after fasting for at least 8 hours.  MRSA PCR Screening     Status: None   Collection Time: 10/16/19  1:14 AM   Specimen: Nasopharyngeal  Result Value Ref Range   MRSA by PCR NEGATIVE NEGATIVE    Comment:        The GeneXpert MRSA Assay (FDA approved for NASAL specimens only), is one component of a comprehensive MRSA colonization surveillance program. It is not intended to diagnose MRSA infection nor to guide or monitor treatment for MRSA infections. Performed at [facility], [address], [place], [place] [address]   Glucose, capillary     Status: Abnormal   Collection Time: 10/16/19  3:43 AM  Result Value Ref Range   Glucose-Capillary 179 (H) 70 - 99 mg/dL    Comment: Glucose reference range applies only to samples taken after fasting for at least 8 hours.    CT HEAD WO CONTRAST  Result Date: 10/16/2019 CLINICAL DATA:  Follow-up examination for intracranial hemorrhage. EXAM: CT HEAD WITHOUT CONTRAST TECHNIQUE: Contiguous axial images were obtained from the base of the skull through the vertex without intravenous contrast. COMPARISON:  Prior CT from 10/15/2019. FINDINGS: Brain: Acute intraparenchymal hemorrhage emanating from the left thalamus again seen, not significantly changed in size and morphology as compared to previous exam. This measures 3.4 x 3.1 x 2.0 cm (estimated volume 11 cc). Mildly increased localized edema with trace 5 mm localized left-to-right shift at the septum pellucidum. Associated intraventricular extension with blood seen throughout the ventricular system. Degree of  intraventricular blood is similar. Associated obstructive hydrocephalus appears slightly worsened from previous. No other acute intracranial  hemorrhage. No acute large vessel territory infarct. No extra-axial fluid collection or visible mass lesion. Vascular: No hyperdense vessel. Aneurysm clip position near the left ICA terminus again noted. Skull: No scalp soft tissue abnormality. Prior left frontal craniotomy. Sinuses/Orbits: Globes and orbital soft tissues within normal limits. Paranasal sinuses and mastoid air cells remain clear. Other: None. IMPRESSION: 1. No significant interval change in size and morphology of acute intraparenchymal hemorrhage emanating from the left thalamus, estimated volume 11 CC. Mildly increased localized edema with trace 5 mm localized left-to-right shift at the septum pellucidum. 2. Associated intraventricular extension with blood throughout the ventricular system, similar to previous. Associated obstructive hydrocephalus appears slightly worsened from previous. 3. No other new acute intracranial abnormality. Electronically Signed   By: Rise Mu M.D.   On: 10/16/2019 02:11   CT HEAD CODE STROKE WO CONTRAST  Result Date: 10/15/2019 CLINICAL DATA:  Code stroke.  Altered mental status.  Headache. EXAM: CT HEAD WITHOUT CONTRAST TECHNIQUE: Contiguous axial images were obtained from the base of the skull through the vertex without intravenous contrast. COMPARISON:  Head CT 08/26/2018 FINDINGS: Brain: There is acute parenchymal hemorrhage with the epicenter in the left thalamus. The hematoma measures 3.2 x 2.3 x 2.5 cm (volume = 9.6 cm^3). There is intraventricular penetration with blood filling the third ventricle and nearly filling the fourth ventricle. Small amount in the frontal horns of the lateral ventricles. No surrounding edema at this time. Lateral ventricles are dilated compared to the study of April 2020. Elsewhere, there chronic small-vessel ischemic changes of white matter. No large vessel territory stroke. No sign of mass. No extra-axial collection. Vascular: Previous aneurysm clipping at the base of  the brain on the left. Skull: Previous left pterional craniotomy. Sinuses/Orbits: Clear/normal Other: None ASPECTS ([place]berta Stroke Program Early CT Score) - Ganglionic level infarction (caudate, lentiform nuclei, internal capsule, insula, M1-M3 cortex): 7 - Supraganglionic infarction (M4-M6 cortex): 3 Total score (0-10 with 10 being normal): 10 IMPRESSION: 1. Acute intraparenchymal hemorrhage in the left thalamus, 9.6 cc. Intraventricular penetration. Early dilatation of the lateral ventricles. 2. ASPECTS is 10 3. These results were communicated to Dr. Otelia Limes at 7:38 pmon 6/17/2021by text page via the [facility] messaging system. Electronically Signed   By: Paulina Fusi M.D.   On: 10/15/2019 19:40    Review of Systems - Per HPI   Blood pressure (!) 161/99, pulse (!) 152, temperature (!) 97.3 F (36.3 C), temperature source Axillary, resp. rate 18, SpO2 98 %. Physical Exam: The patient is obtunded and sedated on propofol upon examination. She does not arouse to noxious stimuli and is unable to follow commands. Pupils 2 and sluggish. PERRL. She does not track and no gaze preference was appreciated. Localizes with LUE. Moves LLE purposefully. Moves RUE and RLE nonpurposefully to noxious stimuli.  Assessment/Plan: The patient has a left thalamic hemorrhage with intraventricular extension and worsening hydrocephalus. EVD discussed with the patient's husband, Latoya Cox, as well as risks and benefits, and the husband wished to procede with placement of the EVD. After placement of the EVD, the patient begun to have more spontaneous movement in her LUE and was reaching towards her ETT. She was subsequently given propofol boluses and Versed IV pushes. EVD draining pink tinged CSF. Continue Drain at 10 Cm H2O. Record output Q1H. Pending transfer to 4N ICU. Husband contacted s/p EVD placement and discussed outcome of procedure and neurosurgical plan  moving forward.  Dorian Heckle, MD 10/16/2019, 6:41 AM

## 2019-10-16 NOTE — Progress Notes (Signed)
OT Cancellation Note  Patient Details Name: Latoya Cox MRN: [id number] DOB: 06-11-1965   Cancelled Treatment:    Reason Eval/Treat Not Completed: Active bedrest order, will follow.  Marcy Siren, OT Acute Rehabilitation Services Pager [phone] Office [phone]   Orlando Penner 10/16/2019, 7:38 AM

## 2019-10-16 NOTE — Progress Notes (Signed)
SLP Cancellation Note  Patient Details Name: Meyli Boice MRN: [id number] DOB: Jun 17, 1965   Cancelled treatment:       Reason Eval/Treat Not Completed: Medical issues which prohibited therapy (on vent). Will f/u for cognitive evaluation as able.   Mahala Menghini., M.A. CCC-SLP Acute Rehabilitation Services Pager [phone] Office [phone]  10/16/2019, 8:02 AM

## 2019-10-17 ENCOUNTER — Inpatient Hospital Stay (HOSPITAL_COMMUNITY): Payer: No Typology Code available for payment source

## 2019-10-17 DIAGNOSIS — J9601 Acute respiratory failure with hypoxia: Secondary | ICD-10-CM | POA: Diagnosis not present

## 2019-10-17 DIAGNOSIS — I610 Nontraumatic intracerebral hemorrhage in hemisphere, subcortical: Secondary | ICD-10-CM | POA: Diagnosis not present

## 2019-10-17 LAB — CBC
HCT: 43.5 % (ref 36.0–46.0)
Hemoglobin: 13.6 g/dL (ref 12.0–15.0)
MCH: 26.8 pg (ref 26.0–34.0)
MCHC: 31.3 g/dL (ref 30.0–36.0)
MCV: 85.6 fL (ref 80.0–100.0)
Platelets: 187 10*3/uL (ref 150–400)
RBC: 5.08 MIL/uL (ref 3.87–5.11)
RDW: 14.6 % (ref 11.5–15.5)
WBC: 17.9 10*3/uL — ABNORMAL HIGH (ref 4.0–10.5)
nRBC: 0 % (ref 0.0–0.2)

## 2019-10-17 LAB — BASIC METABOLIC PANEL
Anion gap: 13 (ref 5–15)
BUN: 33 mg/dL — ABNORMAL HIGH (ref 6–20)
CO2: 24 mmol/L (ref 22–32)
Calcium: 9 mg/dL (ref 8.9–10.3)
Chloride: 105 mmol/L (ref 98–111)
Creatinine, Ser: 2.13 mg/dL — ABNORMAL HIGH (ref 0.44–1.00)
GFR calc Af Amer: 30 mL/min — ABNORMAL LOW (ref >60)
GFR calc non Af Amer: 26 mL/min — ABNORMAL LOW (ref >60)
Glucose, Bld: 95 mg/dL (ref 70–99)
Potassium: 3 mmol/L — ABNORMAL LOW (ref 3.5–5.1)
Sodium: 142 mmol/L (ref 135–145)

## 2019-10-17 LAB — GLUCOSE, CAPILLARY
Glucose-Capillary: 135 mg/dL — ABNORMAL HIGH (ref 70–99)
Glucose-Capillary: 96 mg/dL (ref 70–99)
Glucose-Capillary: 202 mg/dL — ABNORMAL HIGH (ref 70–99)
Glucose-Capillary: 146 mg/dL — ABNORMAL HIGH (ref 70–99)
Glucose-Capillary: 135 mg/dL — ABNORMAL HIGH (ref 70–99)

## 2019-10-17 LAB — MAGNESIUM: Magnesium: 2.2 mg/dL (ref 1.7–2.4)

## 2019-10-17 LAB — PHOSPHORUS: Phosphorus: 4.4 mg/dL (ref 2.5–4.6)

## 2019-10-17 LAB — TRIGLYCERIDES: Triglycerides: 241 mg/dL — ABNORMAL HIGH (ref <150)

## 2019-10-17 MED ORDER — LABETALOL HCL 5 MG/ML IV SOLN: 10.0000 mg | INTRAVENOUS | Status: DC | PRN

## 2019-10-17 MED ORDER — LABETALOL HCL 5 MG/ML IV SOLN
10.0000 mg | INTRAVENOUS | Status: DC | PRN
  Administered 2019-10-17 – 2019-10-19 (×7): 10 mg via INTRAVENOUS
  Filled 2019-10-17 (×6): qty 4

## 2019-10-17 MED ORDER — SODIUM CHLORIDE 0.9 % IV SOLN: INTRAVENOUS | Status: DC

## 2019-10-17 MED ORDER — IOHEXOL 350 MG/ML SOLN
75.0000 mL | Freq: Once | INTRAVENOUS | Status: AC | PRN
  Administered 2019-10-17: 75 mL via INTRAVENOUS

## 2019-10-17 MED ORDER — LABETALOL HCL 5 MG/ML IV SOLN
10.0000 mg | INTRAVENOUS | Status: DC | PRN
  Filled 2019-10-17: qty 4

## 2019-10-17 MED ORDER — POTASSIUM CHLORIDE 20 MEQ/15ML (10%) PO SOLN
40.0000 meq | Freq: Three times a day (TID) | ORAL | Status: DC
  Administered 2019-10-17: 40 meq
  Filled 2019-10-17: qty 30

## 2019-10-17 NOTE — Progress Notes (Signed)
Patient ID: Latoya Cox, female   DOB: 1965-10-09, 54 y.o.   MRN: [id number] Subjective: Patient is stable, opens eyes, FC on L, R plegic, ventric patent with ICP 8  Objective: Vital signs in last 24 hours: Temp:  [98 F (36.7 C)-99.4 F (37.4 C)] 99.1 F (37.3 C) (06/19 0800) Pulse Rate:  [80-98] 98 (06/19 0755) Resp:  [16-20] 18 (06/19 0755) BP: (89-149)/(61-99) 130/86 (06/19 0755) SpO2:  [98 %-100 %] 100 % (06/19 0755) FiO2 (%):  [40 %-60 %] 40 % (06/19 0755)  Intake/Output from previous day: 06/18 0701 - 06/19 0700 In: 897.7 [I.V.:897.7] Out: 38 [Urine:210; Drains:79] Intake/Output this shift: Total I/O In: -  Out: 5 [Drains:5]  as above  Lab Results: Lab Results  Component Value Date   WBC 17.9 (H) 10/17/2019   HGB 13.6 10/17/2019   HCT 43.5 10/17/2019   MCV 85.6 10/17/2019   PLT 187 10/17/2019   Lab Results  Component Value Date   INR 1.0 10/15/2019   BMET Lab Results  Component Value Date   NA 142 10/17/2019   K 3.0 (L) 10/17/2019   CL 105 10/17/2019   CO2 24 10/17/2019   GLUCOSE 95 10/17/2019   BUN 33 (H) 10/17/2019   CREATININE 2.13 (H) 10/17/2019   CALCIUM 9.0 10/17/2019    Studies/Results: CT ANGIO HEAD W OR WO CONTRAST  Result Date: 10/17/2019 CLINICAL DATA:  Intraparenchymal hemorrhage. EXAM: CT ANGIOGRAPHY HEAD TECHNIQUE: Multidetector CT imaging of the head was performed using the standard protocol during bolus administration of intravenous contrast. Multiplanar CT image reconstructions and MIPs were obtained to evaluate the vascular anatomy. CONTRAST:  41mL OMNIPAQUE IOHEXOL 350 MG/ML SOLN COMPARISON:  CT head without contrast 10/16/2019 at 1:48 a.m. FINDINGS: CT HEAD Brain: Hemorrhage centered in the left thalamus is stable in size. Intraventricular blood has increased. Left frontal ventriculostomy catheter is in place. Midline shift is slightly more prominent. Blood is seen in the third and fourth ventricles. No new parenchymal hemorrhage is  present. Vascular: Aneurysm clip is again noted. Minimal vascular calcifications are present. No hyperdense vessel is evident. Skull: Craniotomy is noted. Calvarium is otherwise within normal limits. Sinuses: The paranasal sinuses and mastoid air cells are clear. Orbits: The globes and orbits are within normal limits. CTA HEAD Anterior circulation: Atherosclerotic changes are noted within the cavernous internal carotid arteries. No significant stenosis is present. Aneurysm clip is in place. No significant residual recurrent aneurysm is present. The A1 and M1 segments are normal. The anterior communicating artery is patent. MCA bifurcations are intact. ACA and MCA branch vessels are unremarkable. Posterior circulation: The vertebral arteries are codominant. PICA origins are visualized and normal. The basilar artery is normal. Both posterior cerebral arteries originate from basilar tip. The PCA branch vessels are within normal limits. Venous sinuses: The dural sinuses are patent. The straight sinus and deep cerebral veins patent. Cortical veins are unremarkable. Anatomic variants: None IMPRESSION: 1. Stable size of left thalamic hemorrhage. 2. Increased intraventricular blood. With slight increase in midline shift 3. Left frontal ventriculostomy catheter is in place. 4. No significant residual or recurrent aneurysm. 5. No significant proximal stenosis, aneurysm, or branch vessel occlusion within the Circle of Willis. Normal CTA of the head. No focal etiology for the hemorrhage. Electronically Signed   By: San Morelle M.D.   On: 10/17/2019 07:13   CT HEAD WO CONTRAST  Result Date: 10/16/2019 CLINICAL DATA:  Follow-up examination for intracranial hemorrhage. EXAM: CT HEAD WITHOUT CONTRAST TECHNIQUE: Contiguous axial images were  obtained from the base of the skull through the vertex without intravenous contrast. COMPARISON:  Prior CT from 10/15/2019. FINDINGS: Brain: Acute intraparenchymal hemorrhage  emanating from the left thalamus again seen, not significantly changed in size and morphology as compared to previous exam. This measures 3.4 x 3.1 x 2.0 cm (estimated volume 11 cc). Mildly increased localized edema with trace 5 mm localized left-to-right shift at the septum pellucidum. Associated intraventricular extension with blood seen throughout the ventricular system. Degree of intraventricular blood is similar. Associated obstructive hydrocephalus appears slightly worsened from previous. No other acute intracranial hemorrhage. No acute large vessel territory infarct. No extra-axial fluid collection or visible mass lesion. Vascular: No hyperdense vessel. Aneurysm clip position near the left ICA terminus again noted. Skull: No scalp soft tissue abnormality. Prior left frontal craniotomy. Sinuses/Orbits: Globes and orbital soft tissues within normal limits. Paranasal sinuses and mastoid air cells remain clear. Other: None. IMPRESSION: 1. No significant interval change in size and morphology of acute intraparenchymal hemorrhage emanating from the left thalamus, estimated volume 11 CC. Mildly increased localized edema with trace 5 mm localized left-to-right shift at the septum pellucidum. 2. Associated intraventricular extension with blood throughout the ventricular system, similar to previous. Associated obstructive hydrocephalus appears slightly worsened from previous. 3. No other new acute intracranial abnormality. Electronically Signed   By: Rise Mu M.D.   On: 10/16/2019 02:11   DG CHEST PORT 1 VIEW  Result Date: 10/16/2019 CLINICAL DATA:  ET tube and OG tube placed EXAM: PORTABLE CHEST 1 VIEW COMPARISON:  June 29, 2016 FINDINGS: The heart size and mediastinal contours are within normal limits. Probable subsegmental atelectasis seen at the right lung base. ETT is 2.8 cm above the carina. NG tube is seen below the diaphragm within the stomach. The visualized skeletal structures are  unremarkable. IMPRESSION: ET tube and NG tube in satisfactory position. Subsegmental atelectasis at the right lung base. Electronically Signed   By: Jonna Clark M.D.   On: 10/16/2019 06:55   ECHOCARDIOGRAM COMPLETE  Result Date: 10/16/2019    ECHOCARDIOGRAM REPORT   Patient Name:   Latoya Cox Date of Exam: 10/16/2019 Medical Rec #:  [id number]     Height:       62.0 in Accession #:    [id number]    Weight:       172.0 lb Date of Birth:  10/21/1965      BSA:          1.793 m Patient Age:    54 years      BP:           128/62 mmHg Patient Gender: F             HR:           99 bpm. Exam Location:  Inpatient Procedure: 2D Echo, Color Doppler, Cardiac Doppler and Intracardiac            Opacification Agent Indications:    Stroke i163.9  History:        Patient has no prior history of Echocardiogram examinations.                 Risk Factors:Hypertension and Diabetes.  Sonographer:    Irving Burton Senior RDCS Referring Phys: [id number] Marvel Plan  Sonographer Comments: Echo performed with patient supine and on artificial respirator. IMPRESSIONS  1. Left ventricular ejection fraction, by estimation, is 60 to 65%. The left ventricle has normal function. The left ventricle has no regional wall motion abnormalities. There  is mild concentric left ventricular hypertrophy. Left ventricular diastolic parameters are consistent with Grade I diastolic dysfunction (impaired relaxation).  2. Right ventricular systolic function is normal. The right ventricular size is normal.  3. The mitral valve is normal in structure. No evidence of mitral valve regurgitation. No evidence of mitral stenosis.  4. The aortic valve is normal in structure. Aortic valve regurgitation is not visualized. No aortic stenosis is present.  5. Aortic dilatation noted. There is borderline dilatation of the aortic root.  6. The inferior vena cava is normal in size with greater than 50% respiratory variability, suggesting right atrial pressure of 3 mmHg. FINDINGS  Left  Ventricle: Left ventricular ejection fraction, by estimation, is 60 to 65%. The left ventricle has normal function. The left ventricle has no regional wall motion abnormalities. Definity contrast agent was given IV to delineate the left ventricular  endocardial borders. The left ventricular internal cavity size was normal in size. There is mild concentric left ventricular hypertrophy. Left ventricular diastolic parameters are consistent with Grade I diastolic dysfunction (impaired relaxation). Right Ventricle: The right ventricular size is normal. No increase in right ventricular wall thickness. Right ventricular systolic function is normal. Left Atrium: Left atrial size was normal in size. Right Atrium: Right atrial size was normal in size. Pericardium: There is no evidence of pericardial effusion. Mitral Valve: The mitral valve is normal in structure. Normal mobility of the mitral valve leaflets. No evidence of mitral valve regurgitation. No evidence of mitral valve stenosis. Tricuspid Valve: The tricuspid valve is normal in structure. Tricuspid valve regurgitation is not demonstrated. No evidence of tricuspid stenosis. Aortic Valve: The aortic valve is normal in structure. Aortic valve regurgitation is not visualized. No aortic stenosis is present. Pulmonic Valve: The pulmonic valve was normal in structure. Pulmonic valve regurgitation is not visualized. No evidence of pulmonic stenosis. Aorta: Aortic dilatation noted. There is borderline dilatation of the aortic root. Venous: The inferior vena cava is normal in size with greater than 50% respiratory variability, suggesting right atrial pressure of 3 mmHg. IAS/Shunts: No atrial level shunt detected by color flow Doppler.  LEFT VENTRICLE PLAX 2D LVIDd:         4.40 cm LVIDs:         2.90 cm LV PW:         1.30 cm LV IVS:        1.20 cm LVOT diam:     2.20 cm LV SV:         49 LV SV Index:   27 LVOT Area:     3.80 cm  RIGHT VENTRICLE RV S prime:     18.30 cm/s  LEFT ATRIUM             Index       RIGHT ATRIUM           Index LA diam:        2.90 cm 1.62 cm/m  RA Area:     14.90 cm LA Vol (A2C):   57.1 ml 31.85 ml/m RA Volume:   36.90 ml  20.58 ml/m LA Vol (A4C):   68.0 ml 37.93 ml/m LA Biplane Vol: 65.0 ml 36.25 ml/m  AORTIC VALVE LVOT Vmax:   89.23 cm/s LVOT Vmean:  58.733 cm/s LVOT VTI:    0.128 m  AORTA Ao Root diam: 3.90 cm Ao Asc diam:  3.40 cm  SHUNTS Systemic VTI:  0.13 m Systemic Diam: 2.20 cm Rachelle Hora Croitoru MD Electronically signed by Rachelle Hora  Croitoru MD Signature Date/Time: 10/16/2019/11:50:40 AM    Final    CT HEAD CODE STROKE WO CONTRAST  Result Date: 10/15/2019 CLINICAL DATA:  Code stroke.  Altered mental status.  Headache. EXAM: CT HEAD WITHOUT CONTRAST TECHNIQUE: Contiguous axial images were obtained from the base of the skull through the vertex without intravenous contrast. COMPARISON:  Head CT 08/26/2018 FINDINGS: Brain: There is acute parenchymal hemorrhage with the epicenter in the left thalamus. The hematoma measures 3.2 x 2.3 x 2.5 cm (volume = 9.6 cm^3). There is intraventricular penetration with blood filling the third ventricle and nearly filling the fourth ventricle. Small amount in the frontal horns of the lateral ventricles. No surrounding edema at this time. Lateral ventricles are dilated compared to the study of April 2020. Elsewhere, there chronic small-vessel ischemic changes of white matter. No large vessel territory stroke. No sign of mass. No extra-axial collection. 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 Skull: Previous left pterional craniotomy. Sinuses/Orbits: Clear/normal Other: None ASPECTS ([place] Stroke Program Early CT Score) - Ganglionic level infarction (caudate, lentiform nuclei, internal capsule, insula, M1-M3 cortex): 7 - Supraganglionic infarction (M4-M6 cortex): 3 Total score (0-10 with 10 being normal): 10 IMPRESSION: 1. Acute intraparenchymal hemorrhage in the left thalamus, 9.6  cc. Intraventricular penetration. Early dilatation of the lateral ventricles. 2. ASPECTS is 10 3. These results were communicated to Dr. Otelia LimesLindzen at 7:38 pmon 6/17/2021by text page via the [facility] system. Electronically Signed   By: Paulina FusiMark  Shogry M.D.   On: 10/15/2019 19:40   VAS US CAROTID  Result Date: 10/16/2019 Carotid Arterial Duplex Study Indications:       CVA. Risk Factors:      Hypertension, Diabetes. Limitations        Today's exam was limited due to patient positioning. Comparison Study:  no prior Performing Technologist: Blanch MediaMegan Riddle RVS  Examination Guidelines: A complete evaluation includes B-mode imaging, spectral Doppler, color Doppler, and power Doppler as needed of all accessible portions of each vessel. Bilateral testing is considered an integral part of a complete examination. Limited examinations for reoccurring indications may be performed as noted.  Right Carotid Findings: +----------+--------+--------+--------+------------------+--------+           PSV cm/sEDV cm/sStenosisPlaque DescriptionComments +----------+--------+--------+--------+------------------+--------+ CCA Distal51      14                                         +----------+--------+--------+--------+------------------+--------+ ICA Prox  52      12      1-39%   heterogenous               +----------+--------+--------+--------+------------------+--------+ ECA       28      7                                          +----------+--------+--------+--------+------------------+--------+ +----------+--------+-------+--------+-------------------+           PSV cm/sEDV cmsDescribeArm Pressure (mmHG) +----------+--------+-------+--------+-------------------+ [id number]                                         +----------+--------+-------+--------+-------------------+ +---------+--------+--------+--------------+ VertebralPSV cm/sEDV cm/sNot identified  +---------+--------+--------+--------------+  Left Carotid Findings: +----------+--------+--------+--------+------------------+--------+  PSV cm/sEDV cm/sStenosisPlaque DescriptionComments +----------+--------+--------+--------+------------------+--------+ CCA Prox  83      12              heterogenous               +----------+--------+--------+--------+------------------+--------+ CCA Distal54      13              heterogenous               +----------+--------+--------+--------+------------------+--------+ ICA Prox  49      15      1-39%   heterogenous               +----------+--------+--------+--------+------------------+--------+ ICA Distal50      17                                         +----------+--------+--------+--------+------------------+--------+ ECA       60      8                                          +----------+--------+--------+--------+------------------+--------+ +----------+--------+--------+--------+-------------------+           PSV cm/sEDV cm/sDescribeArm Pressure (mmHG) +----------+--------+--------+--------+-------------------+ [id number]                                          +----------+--------+--------+--------+-------------------+ +---------+--------+--+--------+--+---------+ VertebralPSV cm/s44EDV cm/s11Antegrade +---------+--------+--+--------+--+---------+   Summary: Right Carotid: Velocities in the right ICA are consistent with a 1-39% stenosis. Left Carotid: Velocities in the left ICA are consistent with a 1-39% stenosis. Vertebrals: Left vertebral artery demonstrates antegrade flow. Right vertebral             artery was not visualized. *See table(s) above for measurements and observations.     Preliminary     Assessment/Plan: Doing well, continue ventric, NO HEPARIN PLEASE  Estimated body mass index is 31.46 kg/m as calculated from the following:   Height as of 10/05/19: 5\' 2"  (1.575 m).    Weight as of 10/05/19: 78 kg.    LOS: 2 days    12/05/19 10/17/2019, 9:04 AM

## 2019-10-17 NOTE — Progress Notes (Addendum)
NAME:  Latoya Cox, MRN:  [id number], DOB:  Nov 06, 1965, LOS: 2 ADMISSION DATE:  10/15/2019, CONSULTATION DATE:  10/16/19 REFERRING MD:  Otelia Limes, CHIEF COMPLAINT:  Acute encephalopathy  Brief History   54yF with acute encephalopathy from thalamic hemorrhage with intraventricular extension, worsening hydrocephalus requiring EVD and intubation this morning.   History of present illness   54yF with DM, HTN, arthritis who presented to ED 6/17 for R sided weakness and lethargy, change in speech after a headache earlier on day of admission. Much of history if obtained through chart review as she is unable to participate in interview at time of my exam due to her encephaloapthy. On arrival to EMS R facial droop also noted, some nausea and emesis. CTH showed ICH from left basal ganglia with extension into left lateral, third and 4th ventricles, with a small amount of blood in the right lateral ventricle as well, some early hydrocephalus.   On arrival to ICU she was started on cleviprex for SBP 100-140. Her exam deteriorated however and CTH showed worsening hydrocephalus. She has also had intermittent tachycardia which appears to be sinus on the EKGs available for review, including one with HR 146.  Past Medical History  DM HTN Arthritis  Significant Hospital Events   6/18 intubation 6/18 EVD placement  Consults:  PCCM Neurosurgery  Procedures:  6/18 intubation 6/18 EVD placement  Significant Diagnostic Tests:  [facility] 6/18 0150: obstructive hydrocephalus appears slightly worsened. 36mm left to right midline shift at septum pellucidum. No chagne in IPH size  Micro Data:  None available  Antimicrobials:  None  Interim history/subjective:  Intubated yesterday for airway protection. EVD placed. Patient denies pain.   Objective   Blood pressure 130/86, pulse 98, temperature 99.1 F (37.3 C), temperature source Axillary, resp. rate 18, SpO2 100 %.    Vent Mode: PRVC FiO2 (%):  [40 %-60 %]  40 % Set Rate:  [18 bmp] 18 bmp Vt Set:  [400 mL] 400 mL PEEP:  [5 cmH20] 5 cmH20 Plateau Pressure:  [12 cmH20-14 cmH20] 13 cmH20   Intake/Output Summary (Last 24 hours) at 10/17/2019 0957 Last data filed at 10/17/2019 0800 Gross per 24 hour  Intake 797.34 ml  Output 285 ml  Net 512.34 ml   There were no vitals filed for this visit.  Examination: General: Critically ill, lying in bed  HENT: EVD in place Lungs: Clear breath sounds, no wheeze/crackles  Cardiovascular: RRR, no MRG Abdomen: soft, nontender, active bowel sounds  Extremities: -edema  Neuro: opens eyes to verbal/physical stimulation, follows commands. LUE 5/5, LLE 3/5, RLE 3/5, RUE 2/5   Resolved Hospital Problem list   n/a  Assessment & Plan:   Acute hypoxic respiratory failure: intubated for airway protection Plan - Vent Support > Mentation barrier to extubation, Failed SBT today due to Apnea and Low TV   - VAP Bundle  - Trend CXR  - Intermittent fentanyl, propofol for RASS goal 0/-1  ICH with IVH with worsening hydrocephalus:s/p EVD in setting of HTN H/O Aneurysm s/p Clip  Plan - Per Neurology and Neurosurgery, Repeat CTA Head with stable Left Thalamic Hemorrage and increase size in IVH with increase of midline shift  - Continue Norvasc, Coreg  - Systolic Goal <140. PRN Labetalol   Hypokalemia  Plan  -Trend BMP -Replacing K now   DM Plan -Levemir, SSI, and TF Coverage    D/C Fluids. Okay to re-start TF Remaining Management per Primary Team  Best practice:  Diet: npo Pain/Anxiety/Delirium protocol (if  indicated): yes VAP protocol (if indicated): yes DVT prophylaxis: holding, SCDs GI prophylaxis: not indicated Glucose control: SSI Mobility: bed level Code Status: Full Family Communication: Will Update.  Disposition: ICU  Labs   CBC: Recent Labs  Lab 10/15/19 1922 10/15/19 1923 10/15/19 1956 10/16/19 0548 10/17/19 0623  WBC 9.9  --   --  24.0* 17.9*  NEUTROABS 7.6  --   --   --    --   HGB 14.2 15.6* 13.6 16.0* 13.6  HCT 45.9 46.0 40.0 50.8* 43.5  MCV 85.2  --   --  84.5 85.6  PLT 201  --   --  217 187    Basic Metabolic Panel: Recent Labs  Lab 10/15/19 1922 10/15/19 1923 10/15/19 1956 10/16/19 0548 10/16/19 1653 10/17/19 0623  NA 139 142 140 143  --  142  K 2.8* 2.8* 3.0* 3.0*  --  3.0*  CL 102 101  --  103  --  105  CO2 25  --   --  18*  --  24  GLUCOSE 218* 221*  --  193*  --  95  BUN 9 11  --  11  --  33*  CREATININE 0.80 0.70  --  0.80  --  2.13*  CALCIUM 9.2  --   --  9.6  --  9.0  MG  --   --   --   --  2.1 2.2  PHOS  --   --   --   --  4.5 4.4   GFR: Estimated Creatinine Clearance: 29.9 mL/min (A) (by C-G formula based on SCr of 2.13 mg/dL (H)). Recent Labs  Lab 10/15/19 1922 10/16/19 0548 10/17/19 0623  WBC 9.9 24.0* 17.9*    Liver Function Tests: Recent Labs  Lab 10/15/19 1922  AST 19  ALT 17  ALKPHOS 78  BILITOT 1.5*  PROT 7.5  ALBUMIN 4.3   No results for input(s): LIPASE, AMYLASE in the last 168 hours. No results for input(s): AMMONIA in the last 168 hours.  ABG    Component Value Date/Time   PHART 7.486 (H) 10/15/2019 1956   PCO2ART 35.7 10/15/2019 1956   PO2ART 72 (L) 10/15/2019 1956   HCO3 27.0 10/15/2019 1956   TCO2 28 10/15/2019 1956   O2SAT 95.0 10/15/2019 1956     Coagulation Profile: Recent Labs  Lab 10/15/19 1922  INR 1.0    Cardiac Enzymes: No results for input(s): CKTOTAL, CKMB, CKMBINDEX, TROPONINI in the last 168 hours.  HbA1C: Hgb A1c MFr Bld  Date/Time Value Ref Range Status  10/15/2019 07:49 PM 9.1 (H) 4.8 - 5.6 % Final    Comment:    (NOTE)         Prediabetes: 5.7 - 6.4         Diabetes: >6.4         Glycemic control for adults with diabetes: <7.0   01/14/2019 06:43 PM 7.7 (H) 4.8 - 5.6 % Final    Comment:    (NOTE)         Prediabetes: 5.7 - 6.4         Diabetes: >6.4         Glycemic control for adults with diabetes: <7.0     CBG: Recent Labs  Lab 10/16/19 1617  10/16/19 1929 10/16/19 2332 10/17/19 0335 10/17/19 0812  GLUCAP 177* 126* 169* 135* 96    Review of Systems:   unable to obtain due to encephalopathy  Past Medical History  She,  has a past medical history of Arthritis, Chest wall pain, Diabetes mellitus without complication (HCC), and Hypertension.   Surgical History    Past Surgical History:  Procedure Laterality Date  . aneurism repair    . lapband       Social History   reports that she has never smoked. She has never used smokeless tobacco. She reports that she does not drink alcohol and does not use drugs.   Family History   Her family history includes Arthritis in her father and mother; Diabetes in her father and mother; Hypertension in her father and mother; Stroke in her mother.   Allergies Allergies  Allergen Reactions  . Hydrocodone Itching    Pt states "if i take 2, it makes me itch"     Home Medications  Prior to Admission medications   Medication Sig Start Date End Date Taking? Authorizing Provider  amLODipine (NORVASC) 10 MG tablet TAKE 1 TABLET BY MOUTH EVERY DAY 07/27/19   Myrlene Broker, MD  Ascorbic Acid (VITAMIN C) 100 MG tablet Take 100 mg by mouth daily.    [provider]  Benzoyl Peroxide 10 % CREA Use on face twice daily 10/05/19   Myrlene Broker, MD  carvedilol (COREG) 25 MG tablet TAKE 1 TAB 2 TIMES DAILY WITH A MEAL. OVERDUE FOR ANNUAL APPT MUST SEE PROVIDER FOR FUTURE REFILLS 10/05/19   Myrlene Broker, MD  CLEVER CHEK LANCETS MISC Use daily to check sugars. 08/30/15   Myrlene Broker, MD  diclofenac (VOLTAREN) 75 MG EC tablet TAKE 1 TABLET BY MOUTH TWICE A DAY Patient taking differently: Take 75 mg by mouth 2 (two) times daily.  10/30/18   Myrlene Broker, MD  Diethylpropion HCl 25 MG TABS Take 1 tablet by mouth every evening.    [provider]  Doxylamine Succinate, Sleep, (SLEEP AID PO) Take 1 tablet by mouth at bedtime.    [provider]   empagliflozin (JARDIANCE) 25 MG TABS tablet Take 25 mg by mouth daily. 06/02/19   Myrlene Broker, MD  fluconazole (DIFLUCAN) 150 MG tablet Take 1 tablet (150 mg total) by mouth every 3 (three) days. Patient taking differently: Take 150 mg by mouth every 3 (three) days. As needed if symptoms have not improved. 08/28/18   Myrlene Broker, MD  fluticasone Aleda Grana) 50 MCG/ACT nasal spray Place 1 spray daily as needed into both nostrils for allergies. 03/04/17   Myrlene Broker, MD  glucose blood (CLEVER CHOICE MICRO TEST) test strip Use as instructed 08/30/15   Myrlene Broker, MD  glucose blood (ONETOUCH VERIO) test strip 1 each by Other route 2 (two) times daily. Use to check blood sugars twice a day Dx E11.9 01/04/16   Myrlene Broker, MD  hydrALAZINE (APRESOLINE) 25 MG tablet Take 1 tablet (25 mg total) by mouth 3 (three) times daily as needed (SBP >160, DBP>95). 01/17/19 02/16/19  Alessandra Bevels, MD  KLOR-CON M20 20 MEQ tablet TAKE 3 TABLETS BY MOUTH DAILY (NEED OFFICE VISIT AND LABS) 10/05/19   Myrlene Broker, MD  Multiple Vitamins-Minerals (MULTIVITAMIN ADULT) TABS Take 1 tablet by mouth daily.    [provider]  nystatin-triamcinolone ointment (MYCOLOG) APPLY TO AFFECTED AREA TWICE DAILY Patient taking differently: Apply 1 application topically 2 (two) times daily.  01/29/17   Myrlene Broker, MD  omeprazole (PRILOSEC) 40 MG capsule TAKE 1 CAPSULE BY MOUTH EVERY DAY Patient taking differently: Take 40 mg by mouth daily.  10/13/18   Hoyt Koch, MD  ondansetron (ZOFRAN) 4 MG tablet Take 1 tablet (4 mg total) by mouth every 6 (six) hours as needed for nausea. 01/17/19   Guilford Shi, MD  [facility] DELICA LANCETS 74B MISC Use to help check blood sugars twice a day Dx E11.9 01/04/16   Hoyt Koch, MD  pioglitazone (ACTOS) 30 MG tablet TAKE 1 TABLET BY MOUTH EVERY DAY 05/18/19   Hoyt Koch, MD     Critical care time: 32  minutes    This patient is critically ill with acute encephalopathy from thalamic hemorrhage with intraventricular extension, worsening hydrocephalus requiring intubation; which, requires frequent high complexity decision making, assessment, support, evaluation, and titration of therapies. This was completed through the application of advanced monitoring technologies and extensive interpretation of multiple databases. During this encounter critical care time was devoted to patient care services described in this note for 32 minutes.  Hayden Pedro, AGACNP-BC [facility]  PCCM Pgr: [phone]

## 2019-10-17 NOTE — Progress Notes (Signed)
Transported patient to and from CT without incident.  Suctioned patient before and after trip.  No distress noted at this time, will continue to monitor.

## 2019-10-17 NOTE — Evaluation (Signed)
Physical Therapy Evaluation Patient Details Name: Latoya Cox MRN: [id number] DOB: [birth year] Today's Date: 10/17/2019   History of Present Illness  54 y.o. female with DM, HTN and arthritis, [name] and depressed level of consciousness with garbled speech at home, after complaining of a headache earlier in the day. STAT CT head was obtained, revealing an ICH originating from the left basal ganglila, extending into the left lateral, third and 4th ventricles, with a small amount of blood in the right lateral ventricle as well. Early stages of hydrocephalus were also noted on CT. Pt underwent L frontal IVC placement and intubation on 6/18.  Clinical Impression  PT evaluation limited 2/2 hypertension at this time. Pt initially lethargic with inconsistent command following but does progress with continued verbal and tactile stimulation. Pt demonstrates significant R sided weakness with no AROM of RLE noted this session, RN reporting she has seen wiggling of R toes. Pt mobilizes against gravity with L side currently. Mobility deferred 2/2 hypertension at this time and cognition/communication difficult to assess 2/2 intubation. Pt will benefit from continued assessment of mobility when BP is better controlled. PT recommending CIR at this time in the hope that the pt will progress activity tolerance once extubated.    Follow Up Recommendations CIR (hopeful for progression once extubated and more alert)    Equipment Recommendations  Hospital bed;Wheelchair (measurements PT);Wheelchair cushion (measurements PT) (mechanical lift if home today)    Recommendations for Other Services       Precautions / Restrictions Precautions Precautions: Fall;Other (comment) Precaution Comments: EVD, ETT Restrictions Weight Bearing Restrictions: No      Mobility  Bed Mobility Overal bed mobility: Needs Assistance             General bed  mobility comments: PT elevated HOB to 40 degrees, defers rolling or bed in chair mode at this time due to elevated BP  Transfers                    Ambulation/Gait                Stairs            Wheelchair Mobility    Modified Rankin (Stroke Patients Only) Modified Rankin (Stroke Patients Only) Pre-Morbid Rankin Score: No symptoms Modified Rankin: Severe disability     Balance                                             Pertinent Vitals/Pain Pain Assessment: Faces Faces Pain Scale: No hurt    Home Living Family/patient expects to be discharged to:: Unsure Living Arrangements:  (unable to determine 2/2 ETT and no family present)                    Prior Function           Comments: unable to determine 2/2 ETT and no family present     Hand Dominance        Extremity/Trunk Assessment   Upper Extremity Assessment Upper Extremity Assessment: RUE deficits/detail;LUE deficits/detail RUE Deficits / Details: RUE PROM WFL, RUE drift present, 3-/5 grip strength, difficult to formally assess strength 2/2 lethargy LUE Deficits / Details: AROM WFL, difficult to assess strenght 2/2 lethargy but strength is at least 3/5 based on observed movement  Lower Extremity Assessment Lower Extremity Assessment: RLE deficits/detail RLE Deficits / Details: no AROM noted, flaccid, PROM WFL, no increased tone or clonus    Cervical / Trunk Assessment Cervical / Trunk Assessment: Other exceptions Cervical / Trunk Exceptions: neck in R lateral rotation due to ETT to that side  Communication   Communication: Other (comment) (intubated)  Cognition Arousal/Alertness: Lethargic (does arouse with stimulation) Behavior During Therapy: Flat affect Overall Cognitive Status: Difficult to assess                                 General Comments: pt command following improved with stimulation and with elevation of HOB. pt able to  nod head intermittently to Pt communication but PT does not observe pt nodding no, only yes      General Comments General comments (skin integrity, edema, etc.): BP elevated to 160/101 with stimulation and minimal change of position in [facility], PT defers further mobility due to BP goal of <140 in orders and multiple BPs taken with BP in high 150s or low 423N systolic. RN present prior to session to clamp drain, is made aware of hypertension and to unclamp drain at completion of assessment    Exercises     Assessment/Plan    PT Assessment Patient needs continued PT services  PT Problem List Decreased strength;Decreased activity tolerance;Decreased balance;Decreased mobility;Decreased knowledge of precautions       PT Treatment Interventions DME instruction;Gait training;Stair training;Functional mobility training;Therapeutic activities;Therapeutic exercise;Balance training;Neuromuscular re-education;Cognitive remediation;Patient/family education;Wheelchair mobility training    PT Goals (Current goals can be found in the Care Plan section)  Acute Rehab PT Goals Patient Stated Goal: Pt unable to state, PT goal to improve mobility PT Goal Formulation: Patient unable to participate in goal setting Time For Goal Achievement: 10/31/19 Potential to Achieve Goals: Fair    Frequency Min 4X/week   Barriers to discharge        Co-evaluation               AM-PAC PT "6 Clicks" Mobility  Outcome Measure Help needed turning from your back to your side while in a flat bed without using bedrails?: Total Help needed moving from lying on your back to sitting on the side of a flat bed without using bedrails?: Total Help needed moving to and from a bed to a chair (including a wheelchair)?: Total Help needed standing up from a chair using your arms (e.g., wheelchair or bedside chair)?: Total Help needed to walk in hospital room?: Total Help needed climbing 3-5 steps with a railing? : Total 6  Click Score: 6    End of Session Equipment Utilized During Treatment: Oxygen Activity Tolerance: Treatment limited secondary to medical complications (Comment) (hypertension) Patient left: in bed;with call bell/phone within reach;with bed alarm set Nurse Communication: Mobility status PT Visit Diagnosis: Other abnormalities of gait and mobility (R26.89);Muscle weakness (generalized) (M62.81);Other symptoms and signs involving the nervous system (R29.898)    Time: 3614-4315 PT Time Calculation (min) (ACUTE ONLY): 10 min   Charges:   PT Evaluation $PT Eval High Complexity: 1 High          Zenaida Niece, PT, DPT Acute Rehabilitation Pager: [phone]   Zenaida Niece 10/17/2019, 3:16 PM

## 2019-10-17 NOTE — Plan of Care (Signed)
  Problem: Self-Care: Goal: Ability to participate in self-care as condition permits will improve Outcome: Progressing   Problem: Nutrition: Goal: Dietary intake will improve Outcome: Progressing   Problem: Intracerebral Hemorrhage Tissue Perfusion: Goal: Complications of Intracerebral Hemorrhage will be minimized Outcome: Progressing

## 2019-10-17 NOTE — Progress Notes (Signed)
Inpatient Rehab Admissions Coordinator Note:   Per PT recommendation, pt was screened for CIR candidacy by Wolfgang Phoenix, MS, CCC-SLP.  At this time we are not recommending an Inpatient Rehab consult.  Will continue to monitor pt from afar.  Please contact me with questions.   Wolfgang Phoenix, MS, CCC-SLP Admissions Coordinator [phone] 10/17/19 4:50 PM

## 2019-10-17 NOTE — Progress Notes (Signed)
STROKE TEAM PROGRESS NOTE   INTERVAL HISTORY External intraventricular drain has been placed.  CT scan is reviewed today in person and seems unchanged from the previous scan done yesterday.  Still intubated.  Blood pressure is up to 160s/90s-100 at the bedside during evaluation.  Goals lowered from 160 systolic to 140 for now.   OBJECTIVE Vitals:   10/17/19 0500 10/17/19 0600 10/17/19 0700 10/17/19 0755  BP: (!) 128/92 134/86 130/86 130/86  Pulse: 95 93 91 98  Resp: 18 18 18 18   Temp:      TempSrc:      SpO2: 100% 100% 100% 100%    CBC:  Recent Labs  Lab 10/15/19 1922 10/15/19 1923 10/16/19 0548 10/17/19 0623  WBC 9.9   < > 24.0* 17.9*  NEUTROABS 7.6  --   --   --   HGB 14.2   < > 16.0* 13.6  HCT 45.9   < > 50.8* 43.5  MCV 85.2   < > 84.5 85.6  PLT 201   < > 217 187   < > = values in this interval not displayed.    Basic Metabolic Panel:  Recent Labs  Lab 10/16/19 0548 10/16/19 1653 10/17/19 0623  NA 143  --  142  K 3.0*  --  3.0*  CL 103  --  105  CO2 18*  --  24  GLUCOSE 193*  --  95  BUN 11  --  33*  CREATININE 0.80  --  2.13*  CALCIUM 9.6  --  9.0  MG  --  2.1 2.2  PHOS  --  4.5 4.4    Lipid Panel:     Component Value Date/Time   CHOL 274 (H) 10/15/2019 1949   TRIG 241 (H) 10/17/2019 0623   HDL 64 10/15/2019 1949   CHOLHDL 4.3 10/15/2019 1949   VLDL 24 10/15/2019 1949   LDLCALC 186 (H) 10/15/2019 1949   HgbA1c:  Lab Results  Component Value Date   HGBA1C 9.1 (H) 10/15/2019   Urine Drug Screen:     Component Value Date/Time   LABOPIA NONE DETECTED 10/16/2019 2204   COCAINSCRNUR NONE DETECTED 10/16/2019 2204   LABBENZ POSITIVE (A) 10/16/2019 2204   AMPHETMU NONE DETECTED 10/16/2019 2204   THCU NONE DETECTED 10/16/2019 2204   LABBARB NONE DETECTED 10/16/2019 2204    Alcohol Level     Component Value Date/Time   ETH <10 10/15/2019 1922    IMAGING  CT HEAD WO CONTRAST 10/16/2019 IMPRESSION:  1. No significant interval change in size  and morphology of acute intraparenchymal hemorrhage emanating from the left thalamus, estimated volume 11 CC. Mildly increased localized edema with trace 5 mm localized left-to-right shift at the septum pellucidum.  2. Associated intraventricular extension with blood throughout the ventricular system, similar to previous. Associated obstructive hydrocephalus appears slightly worsened from previous.  3. No other new acute intracranial abnormality.   CTA Head  10/17/2019 IMPRESSION: 1. Stable size of left thalamic hemorrhage. 2. Increased intraventricular blood. With slight increase in midline shift 3. Left frontal ventriculostomy catheter is in place. 4. No significant residual or recurrent aneurysm. 5. No significant proximal stenosis, aneurysm, or branch vessel occlusion within the Circle of Willis. Normal CTA of the head. No focal etiology for the hemorrhage.   DG CHEST PORT 1 VIEW 10/16/2019 IMPRESSION: ET tube and NG tube in satisfactory position. Subsegmental atelectasis at the right lung base.   ECHOCARDIOGRAM COMPLETE 10/16/2019 IMPRESSIONS   1. Left ventricular ejection fraction,  by estimation, is 60 to 65%. The left ventricle has normal function. The left ventricle has no regional wall motion abnormalities. There is mild concentric left ventricular hypertrophy. Left ventricular diastolic parameters are consistent with Grade I diastolic dysfunction (impaired relaxation).   2. Right ventricular systolic function is normal. The right ventricular size is normal.   3. The mitral valve is normal in structure. No evidence of mitral valve regurgitation. No evidence of mitral stenosis.   4. The aortic valve is normal in structure. Aortic valve regurgitation is not visualized. No aortic stenosis is present.   5. Aortic dilatation noted. There is borderline dilatation of the aortic root.   6. The inferior vena cava is normal in size with greater than 50% respiratory variability, suggesting  right atrial pressure of 3 mmHg.  CT HEAD CODE STROKE WO CONTRAST 10/15/2019 IMPRESSION:  1. Acute intraparenchymal hemorrhage in the left thalamus, 9.6 cc. Intraventricular penetration. Early dilatation of the lateral ventricles.  2. ASPECTS is 10   VAS US CAROTID 10/16/2019 Summary:  Right Carotid: Velocities in the right ICA are consistent with a 1-39% stenosis.  Left Carotid: Velocities in the left ICA are consistent with a 1-39% stenosis.  Vertebrals: Left vertebral artery demonstrates antegrade flow. Right vertebral artery was not visualized.    ECG - ST rate 104 BPM. (See cardiology reading for complete details)  PHYSICAL EXAM  Temp:  [98 F (36.7 C)-99.4 F (37.4 C)] 99.4 F (37.4 C) (06/19 0400) Pulse Rate:  [80-98] 98 (06/19 0755) Resp:  [16-20] 18 (06/19 0755) BP: (89-149)/(61-99) 130/86 (06/19 0755) SpO2:  [98 %-100 %] 100 % (06/19 0755) FiO2 (%):  [40 %-60 %] 40 % (06/19 0755)  General - Well nourished, well developed, intubated on propofol.  Ophthalmologic - fundi not visualized due to noncooperation.  Cardiovascular - Regular rate and rhythm.  Neuro - intubated on sedation, eyes closed barely open on pain stimulation, not following commands. With forced eye opening, left eye in mid position, but right eye on lateral gaze position, not blinking to visual threat bilaterally, doll's eyes present, not tracking, PERRL. Corneal reflex weak bilaterally, gag and cough present. Breathing over the vent.  Facial symmetry not able to test due to ET tube.  Tongue protrusion not cooperative. Spontaneously moving left UE and LE and withdraw to pain, RUE and RLE also withdraw to pain but weaker than the left. DTR 1+ and no babinski. Sensation, coordination and gait not tested.    ASSESSMENT/PLAN [name] is a 54 y.o. female with DM, HTN, previous hx of aneurysm repair and arthritis, presenting with acute onset of right sided weakness and depressed level of consciousness  with garbled speech after complaining of a headache earlier in the day. tPA Given: No: ICH - ICH score: 2  ICH with IVH - left thalamic likely due to HTN  CT Head - Acute intraparenchymal hemorrhage in the left thalamus, 9.6 cc. Intraventricular penetration. Early dilatation of the lateral ventricles.     CT head - No significant interval change in size and morphology of acute intraparenchymal hemorrhage emanating from the left thalamus, estimated volume 11 CC. 5 mm MLS. IVH similar to previous. Associated obstructive hydrocephalus appears slightly worsened from previous.   MRI head - not able to perform due to previous aneurysm clips  CTA Head 6/19 -  Stable size of left thalamic hemorrhage. Increased intraventricular blood. With slight increase in midline shift. Left frontal ventriculostomy catheter is in place. No significant residual or recurrent aneurysm.  No significant proximal stenosis, aneurysm, or branch vessel occlusion within the Circle of Willis. Normal CTA of the head. No focal etiology for the hemorrhage.  Carotid Doppler - unremarkable  2D Echo - EF 60 - 65%. No cardiac source of emboli identified.  Sars Corona Virus 2  - negative  LDL - 186  HgbA1c - 7.7  UDS - benzodiazepine  VTE prophylaxis - heparin subq  No antithrombotic prior to admission, now on No antithrombotic  Ongoing aggressive stroke risk factor management  Therapy recommendations:  Bedrest - pending  Disposition:  Pending  Obstructive hydrocephalus  CT repeat showed worsening obstructive hydrocephalus  Neurosurgery on board  Status post EVD  Repeat CTA 6/19 - Stable size of left thalamic hemorrhage. Increased intraventricular blood. With slight increase in midline shift. Left frontal ventriculostomy catheter is in place. No significant residual or recurrent aneurysm. No significant proximal stenosis, aneurysm, or branch vessel occlusion within the Circle of Willis. Normal CTA of the head. No  focal etiology for the hemorrhage.  History of aneurysm  As per husband, it was many years ago  Status post aneurysm clip  Clip not compatible with MRI due to no detailed information from husband  CTA head 6/19 - Stable size of left thalamic hemorrhage. Increased intraventricular blood. With slight increase in midline shift. Left frontal ventriculostomy catheter is in place. No significant residual or recurrent aneurysm. No significant proximal stenosis, aneurysm, or branch vessel occlusion within the Circle of Willis. Normal CTA of the head. No focal etiology for the hemorrhage.  Hypertensive emergency  Home BP meds: Norvasc ; Coreg ; Apresoline  Current BP meds: Norvasc ; Coreg   Off cleviprex  Stable now (SBP 128 - 134)  SBP goal < 140 mm Hg  Long-term BP goal normotensive  Hyperlipidemia  Home Lipid lowering medication: none   LDL 186, goal < 70  Current lipid lowering medication: none for now due to [place]   Consider statin at discharge  Diabetes  Home diabetic meds: Jardiance ; Actos  Current diabetic meds: levemir  SSI  CBG monitoring  HgbA1c 7.7, goal < 7.0  Close PCP follow up  Dysphagia  Due to intubation the stroke  NPO  On tube feeding @ 20  On IV fluid @ 9  Speech on board  Other Stroke Risk Factors  Advanced age  Obesity, s/p bariatric sugery, recommend weight loss, diet and exercise as appropriate   Family hx stroke (mother)   Hx of cardiomyopathy  Other Active Problems  EVD placed today Dr Vertell Limber  WBCs - 9.9->24K->17.9   (temp 99.4) (UA 6/18 not c/w UTI) - currently not on abxs. CXR 6/18 - Subsegmental atelectasis at the right lung base.  Hypokalemia 3.0 - replete and recheck -> 3.0 replete and recheck   Hospital day # 2  This patient is critically ill due to left thalamic ICH, IVH, obstructive hydrocephalus, status post EVD, hypertensive emergency and at significant risk of neurological worsening, death form hematoma  expansion, hydrocephalus, seizure, hypertensive encephalopathy, heart failure. This patient's care requires constant monitoring of vital signs, hemodynamics, respiratory and cardiac monitoring, review of multiple databases, neurological assessment, discussion with family, other specialists and medical decision making of high complexity. I spent 35 minutes of neurocritical care time in the care of this patient.   To contact Stroke Continuity provider, please refer to [email or web address] After hours, contact General Neurology

## 2019-10-18 ENCOUNTER — Inpatient Hospital Stay (HOSPITAL_COMMUNITY): Payer: No Typology Code available for payment source

## 2019-10-18 DIAGNOSIS — J9601 Acute respiratory failure with hypoxia: Secondary | ICD-10-CM | POA: Diagnosis not present

## 2019-10-18 DIAGNOSIS — I610 Nontraumatic intracerebral hemorrhage in hemisphere, subcortical: Secondary | ICD-10-CM | POA: Diagnosis not present

## 2019-10-18 LAB — GLUCOSE, CAPILLARY
Glucose-Capillary: 110 mg/dL — ABNORMAL HIGH (ref 70–99)
Glucose-Capillary: 111 mg/dL — ABNORMAL HIGH (ref 70–99)
Glucose-Capillary: 136 mg/dL — ABNORMAL HIGH (ref 70–99)
Glucose-Capillary: 148 mg/dL — ABNORMAL HIGH (ref 70–99)
Glucose-Capillary: 157 mg/dL — ABNORMAL HIGH (ref 70–99)
Glucose-Capillary: 158 mg/dL — ABNORMAL HIGH (ref 70–99)
Glucose-Capillary: 178 mg/dL — ABNORMAL HIGH (ref 70–99)

## 2019-10-18 LAB — BASIC METABOLIC PANEL
Anion gap: 11 (ref 5–15)
BUN: 41 mg/dL — ABNORMAL HIGH (ref 6–20)
CO2: 24 mmol/L (ref 22–32)
Calcium: 8.8 mg/dL — ABNORMAL LOW (ref 8.9–10.3)
Chloride: 111 mmol/L (ref 98–111)
Creatinine, Ser: 1.02 mg/dL — ABNORMAL HIGH (ref 0.44–1.00)
GFR calc Af Amer: >60 mL/min (ref >60)
GFR calc non Af Amer: >60 mL/min (ref >60)
Glucose, Bld: 120 mg/dL — ABNORMAL HIGH (ref 70–99)
Potassium: 3.4 mmol/L — ABNORMAL LOW (ref 3.5–5.1)
Sodium: 146 mmol/L — ABNORMAL HIGH (ref 135–145)

## 2019-10-18 LAB — CBC
HCT: 44.3 % (ref 36.0–46.0)
Hemoglobin: 14 g/dL (ref 12.0–15.0)
MCH: 26.9 pg (ref 26.0–34.0)
MCHC: 31.6 g/dL (ref 30.0–36.0)
MCV: 85 fL (ref 80.0–100.0)
Platelets: 169 10*3/uL (ref 150–400)
RBC: 5.21 MIL/uL — ABNORMAL HIGH (ref 3.87–5.11)
RDW: 14.6 % (ref 11.5–15.5)
WBC: 16.4 10*3/uL — ABNORMAL HIGH (ref 4.0–10.5)
nRBC: 0 % (ref 0.0–0.2)

## 2019-10-18 LAB — PHOSPHORUS: Phosphorus: 3.8 mg/dL (ref 2.5–4.6)

## 2019-10-18 LAB — MAGNESIUM: Magnesium: 2.2 mg/dL (ref 1.7–2.4)

## 2019-10-18 LAB — TRIGLYCERIDES: Triglycerides: 287 mg/dL — ABNORMAL HIGH (ref <150)

## 2019-10-18 MED ORDER — DEXMEDETOMIDINE HCL IN NACL 400 MCG/100ML IV SOLN
0.4000 ug/kg/h | INTRAVENOUS | Status: DC
  Filled 2019-10-18: qty 100

## 2019-10-18 MED ORDER — INSULIN ASPART 100 UNIT/ML ~~LOC~~ SOLN
3.0000 [IU] | SUBCUTANEOUS | Status: DC
  Administered 2019-10-19 – 2019-10-25 (×33): 3 [IU] via SUBCUTANEOUS

## 2019-10-18 MED ORDER — FREE WATER
50.0000 mL | Freq: Four times a day (QID) | Status: DC
  Administered 2019-10-18 – 2019-10-19 (×5): 50 mL

## 2019-10-18 MED ORDER — POTASSIUM CHLORIDE 20 MEQ/15ML (10%) PO SOLN
40.0000 meq | Freq: Once | ORAL | Status: AC
  Administered 2019-10-18: 40 meq
  Filled 2019-10-18: qty 30

## 2019-10-18 NOTE — Progress Notes (Signed)
STROKE TEAM PROGRESS NOTE   INTERVAL HISTORY External ventricular drain is still in place.  Blood pressures are stabilized and neuro exam is mostly unchanged.  She seems quite drowsy however.  Nursing staff reports that the patient was more alert in the latter part of the day yesterday off sedation.  OBJECTIVE Vitals:   10/18/19 0200 10/18/19 0300 10/18/19 0330 10/18/19 0400  BP: 134/83 104/75    Pulse: 91 92    Resp: 18 18    Temp:    98.3 F (36.8 C)  TempSrc:    Axillary  SpO2: 100% 100% 100%     CBC:  Recent Labs  Lab 10/15/19 1922 10/15/19 1923 10/16/19 0548 10/17/19 0623  WBC 9.9   < > 24.0* 17.9*  NEUTROABS 7.6  --   --   --   HGB 14.2   < > 16.0* 13.6  HCT 45.9   < > 50.8* 43.5  MCV 85.2   < > 84.5 85.6  PLT 201   < > 217 187   < > = values in this interval not displayed.    Basic Metabolic Panel:  Recent Labs  Lab 10/16/19 0548 10/16/19 1653 10/17/19 0623  NA 143  --  142  K 3.0*  --  3.0*  CL 103  --  105  CO2 18*  --  24  GLUCOSE 193*  --  95  BUN 11  --  33*  CREATININE 0.80  --  2.13*  CALCIUM 9.6  --  9.0  MG  --  2.1 2.2  PHOS  --  4.5 4.4    Lipid Panel:     Component Value Date/Time   CHOL 274 (H) 10/15/2019 1949   TRIG 241 (H) 10/17/2019 0623   HDL 64 10/15/2019 1949   CHOLHDL 4.3 10/15/2019 1949   VLDL 24 10/15/2019 1949   LDLCALC 186 (H) 10/15/2019 1949   HgbA1c:  Lab Results  Component Value Date   HGBA1C 9.1 (H) 10/15/2019   Urine Drug Screen:     Component Value Date/Time   LABOPIA NONE DETECTED 10/16/2019 2204   COCAINSCRNUR NONE DETECTED 10/16/2019 2204   LABBENZ POSITIVE (A) 10/16/2019 2204   AMPHETMU NONE DETECTED 10/16/2019 2204   THCU NONE DETECTED 10/16/2019 2204   LABBARB NONE DETECTED 10/16/2019 2204    Alcohol Level     Component Value Date/Time   ETH <10 10/15/2019 1922    IMAGING  CT HEAD WO CONTRAST 10/16/2019 IMPRESSION:  1. No significant interval change in size and morphology of acute  intraparenchymal hemorrhage emanating from the left thalamus, estimated volume 11 CC. Mildly increased localized edema with trace 5 mm localized left-to-right shift at the septum pellucidum.  2. Associated intraventricular extension with blood throughout the ventricular system, similar to previous. Associated obstructive hydrocephalus appears slightly worsened from previous.  3. No other new acute intracranial abnormality.   CTA Head  10/17/2019 IMPRESSION: 1. Stable size of left thalamic hemorrhage. 2. Increased intraventricular blood. With slight increase in midline shift 3. Left frontal ventriculostomy catheter is in place. 4. No significant residual or recurrent aneurysm. 5. No significant proximal stenosis, aneurysm, or branch vessel occlusion within the Circle of Willis. Normal CTA of the head. No focal etiology for the hemorrhage.   DG CHEST PORT 1 VIEW 10/16/2019 IMPRESSION: ET tube and NG tube in satisfactory position. Subsegmental atelectasis at the right lung base.   ECHOCARDIOGRAM COMPLETE 10/16/2019 IMPRESSIONS   1. Left ventricular ejection fraction, by estimation, is  60 to 65%. The left ventricle has normal function. The left ventricle has no regional wall motion abnormalities. There is mild concentric left ventricular hypertrophy. Left ventricular diastolic parameters are consistent with Grade I diastolic dysfunction (impaired relaxation).   2. Right ventricular systolic function is normal. The right ventricular size is normal.   3. The mitral valve is normal in structure. No evidence of mitral valve regurgitation. No evidence of mitral stenosis.   4. The aortic valve is normal in structure. Aortic valve regurgitation is not visualized. No aortic stenosis is present.   5. Aortic dilatation noted. There is borderline dilatation of the aortic root.   6. The inferior vena cava is normal in size with greater than 50% respiratory variability, suggesting right atrial pressure of 3  mmHg.  CT HEAD CODE STROKE WO CONTRAST 10/15/2019 IMPRESSION:  1. Acute intraparenchymal hemorrhage in the left thalamus, 9.6 cc. Intraventricular penetration. Early dilatation of the lateral ventricles.  2. ASPECTS is 10   VAS US CAROTID 10/16/2019 Summary:  Right Carotid: Velocities in the right ICA are consistent with a 1-39% stenosis.  Left Carotid: Velocities in the left ICA are consistent with a 1-39% stenosis.  Vertebrals: Left vertebral artery demonstrates antegrade flow. Right vertebral artery was not visualized.    ECG - ST rate 104 BPM. (See cardiology reading for complete details)  PHYSICAL EXAM  Temp:  [98.1 F (36.7 C)-99.5 F (37.5 C)] 98.3 F (36.8 C) (06/20 0400) Pulse Rate:  [82-103] 92 (06/20 0300) Resp:  [13-21] 18 (06/20 0300) BP: (104-157)/(70-106) 104/75 (06/20 0300) SpO2:  [97 %-100 %] 100 % (06/20 0330) FiO2 (%):  [40 %] 40 % (06/20 0330)  General - Well nourished, well developed, intubated on propofol.  Ophthalmologic - fundi not visualized due to noncooperation.  Cardiovascular - Regular rate and rhythm.  Neuro - intubated on sedation; she is alert when stimulated and does follow commands.  Oculocephalic reflexes are intact.  Pupils are reactive to light. Corneal reflex weak bilaterally, gag and cough present. Breathing over the vent.-But only slightly.  Facial symmetry not able to test due to ET tube.  Tongue protrusion not cooperative. Spontaneously moving left UE and LE and withdraw to pain with at least 3/5 strength, RUE 1/5 and RLE 3/5; DTR 1+ and no babinski. Sensation, coordination and gait not tested.    ASSESSMENT/PLAN Latoya Cox is a 54 y.o. female with DM, HTN, previous hx of aneurysm repair and arthritis, presenting with acute onset of right sided weakness and depressed level of consciousness with garbled speech after complaining of a headache earlier in the day. tPA Given: No: ICH - ICH score: 2  ICH with IVH - left thalamic likely  due to HTN  CT Head - Acute intraparenchymal hemorrhage in the left thalamus, 9.6 cc. Intraventricular penetration. Early dilatation of the lateral ventricles.     CT head - No significant interval change in size and morphology of acute intraparenchymal hemorrhage emanating from the left thalamus, estimated volume 11 CC. 5 mm MLS. IVH similar to previous. Associated obstructive hydrocephalus appears slightly worsened from previous.   MRI head - not able to perform due to previous aneurysm clips  CTA Head 6/19 -  Stable size of left thalamic hemorrhage. Increased intraventricular blood. With slight increase in midline shift. Left frontal ventriculostomy catheter is in place. No significant residual or recurrent aneurysm. No significant proximal stenosis, aneurysm, or branch vessel occlusion within the Circle of Willis. Normal CTA of the head. No focal etiology  for the hemorrhage.  Carotid Doppler - unremarkable  2D Echo - EF 60 - 65%. No cardiac source of emboli identified.  Sars Corona Virus 2  - negative  LDL - 186  HgbA1c - 7.7  UDS - benzodiazepine  VTE prophylaxis - heparin subq  No antithrombotic prior to admission, now on No antithrombotic  Ongoing aggressive stroke risk factor management  Therapy recommendations:  Bedrest - pending  Disposition:  Pending  Obstructive hydrocephalus  CT repeat showed worsening obstructive hydrocephalus  Neurosurgery on board  Status post EVD  Repeat CTA 6/19 - Stable size of left thalamic hemorrhage. Increased intraventricular blood. With slight increase in midline shift. Left frontal ventriculostomy catheter is in place. No significant residual or recurrent aneurysm. No significant proximal stenosis, aneurysm, or branch vessel occlusion within the Circle of Willis. Normal CTA of the head. No focal etiology for the hemorrhage.  History of aneurysm  As per husband, it was many years ago  Status post aneurysm clip  Clip not  compatible with MRI due to no detailed information from husband  CTA head 6/19 - Stable size of left thalamic hemorrhage. Increased intraventricular blood. With slight increase in midline shift. Left frontal ventriculostomy catheter is in place. No significant residual or recurrent aneurysm. No significant proximal stenosis, aneurysm, or branch vessel occlusion within the Circle of Willis. Normal CTA of the head. No focal etiology for the hemorrhage.  Hypertensive emergency  Home BP meds: Norvasc ; Coreg ; Apresoline  Current BP meds: Norvasc ; Coreg   Off cleviprex  Stable now (SBP 128 - 134) . SBP goal < 140 mm Hg . Long-term BP goal normotensive  Hyperlipidemia  Home Lipid lowering medication: none   LDL 186, goal < 70  Current lipid lowering medication: none for now due to [place]   Consider statin at discharge  Diabetes  Home diabetic meds: Jardiance ; Actos  Current diabetic meds: levemir  SSI  CBG monitoring  HgbA1c 7.7, goal < 7.0  Close PCP follow up  Dysphagia  Due to intubation the stroke  NPO  On tube feeding @ 20  On IV fluid @ 30  Speech on board  Other Stroke Risk Factors  Advanced age  Obesity, s/p bariatric sugery, recommend weight loss, diet and exercise as appropriate   Family hx stroke (mother)   Hx of cardiomyopathy  Other Active Problems  EVD placed today Dr Vertell Limber  WBCs - 9.9->24K->17.9   (temp 99.4) (UA 6/18 not c/w UTI) - currently not on abxs. CXR 6/18 - Subsegmental atelectasis at the right lung base.  Hypokalemia 3.0 - replete and recheck -> 3.0 replete and recheck   Hospital day # 3  This patient is critically ill due to left thalamic ICH, IVH, obstructive hydrocephalus, status post EVD, hypertensive emergency and at significant risk of neurological worsening, death form hematoma expansion, hydrocephalus, seizure, hypertensive encephalopathy, heart failure. This patient's care requires constant monitoring of vital signs,  hemodynamics, respiratory and cardiac monitoring, review of multiple databases, neurological assessment, discussion with family, other specialists and medical decision making of high complexity. I spent 40 minutes of neurocritical care time in the care of this patient.   To contact Stroke Continuity provider, please refer to [email or web address] After hours, contact General Neurology

## 2019-10-18 NOTE — Progress Notes (Addendum)
NAME:  Latoya Cox, MRN:  [id number], DOB:  February 07, 1966, LOS: 3 ADMISSION DATE:  10/15/2019, CONSULTATION DATE:  10/16/19 REFERRING MD:  Otelia Limes, CHIEF COMPLAINT:  Acute encephalopathy  Brief History   54yF with acute encephalopathy from thalamic hemorrhage with intraventricular extension, worsening hydrocephalus requiring EVD and intubation this morning.   History of present illness   54yF with DM, HTN, arthritis who presented to ED 6/17 for R sided weakness and lethargy, change in speech after a headache earlier on day of admission. Much of history if obtained through chart review as she is unable to participate in interview at time of my exam due to her encephaloapthy. On arrival to EMS R facial droop also noted, some nausea and emesis. CTH showed ICH from left basal ganglia with extension into left lateral, third and 4th ventricles, with a small amount of blood in the right lateral ventricle as well, some early hydrocephalus.   On arrival to ICU she was started on cleviprex for SBP 100-140. Her exam deteriorated however and CTH showed worsening hydrocephalus. She has also had intermittent tachycardia which appears to be sinus on the EKGs available for review, including one with HR 146.  Past Medical History  DM HTN Arthritis  Significant Hospital Events   6/18 intubation 6/18 EVD placement  Consults:  PCCM Neurosurgery  Procedures:  6/18 intubation 6/18 EVD placement  Significant Diagnostic Tests:  [facility] 6/18 0150: obstructive hydrocephalus appears slightly worsened. 14mm left to right midline shift at septum pellucidum. No chagne in IPH size  Micro Data:  MRSA PCR 6/18 > Negative   Antimicrobials:  None  Interim history/subjective:  Failed SBT this AM due to apnea. Denies pain.   Objective   Blood pressure 137/86, pulse 91, temperature 98.8 F (37.1 C), temperature source Axillary, resp. rate 18, SpO2 100 %.    Vent Mode: PRVC FiO2 (%):  [40 %] 40 % Set Rate:  [18  bmp] 18 bmp Vt Set:  [400 mL] 400 mL PEEP:  [5 cmH20] 5 cmH20 Plateau Pressure:  [12 cmH20-14 cmH20] 13 cmH20   Intake/Output Summary (Last 24 hours) at 10/18/2019 0925 Last data filed at 10/18/2019 0900 Gross per 24 hour  Intake 1444.84 ml  Output 1015 ml  Net 429.84 ml   There were no vitals filed for this visit.  Examination: General: Critically ill, lying in bed  HENT: EVD in place Lungs: Clear breath sounds, no wheeze/crackles  Cardiovascular: RRR, no MRG Abdomen: soft, nontender, active bowel sounds  Extremities: -edema  Neuro: opens eyes to verbal/physical stimulation, lethargic, follows commands. LUE 5/5, LLE 4/5, RLE 3/5, RUE 1/5   Resolved Hospital Problem list   n/a  Assessment & Plan:   Acute hypoxic respiratory failure: intubated for airway protection Plan - Vent Support > Mentation barrier to extubation, Failed SBT today due to Apnea and Low TV  > Sedation on hold will attempt again this afternoon.  - VAP Bundle  - Trend CXR  - Intermittent fentanyl, propofol for RASS goal 0/-1 >> Change Propofol to Precedex due to increasing Triglycerides   ICH with IVH with worsening hydrocephalus:s/p EVD in setting of HTN Repeat CTA Head with stable Left Thalamic Hemorrage and increase size in IVH with increase of midline shift  H/O Aneurysm s/p Clip  Plan - Per Neurology and Neurosurgery,  - Continue Norvasc, Coreg  - Systolic Goal <140. PRN Labetalol   Hypokalemia  Hypernatremia  Plan  -Trend BMP -Replacing K now  -Free Water added  DM Plan -  Levemir, SSI, and TF Coverage   Remaining Management per Primary Team  Best practice:  Diet: TF Pain/Anxiety/Delirium protocol (if indicated): yes VAP protocol (if indicated): yes DVT prophylaxis: holding, SCDs GI prophylaxis: not indicated Glucose control: SSI Mobility: bed level Code Status: Full Family Communication: Will Update.  Disposition: ICU  Labs   CBC: Recent Labs  Lab 10/15/19 1922 10/15/19 1922  10/15/19 1923 10/15/19 1956 10/16/19 0548 10/17/19 0623 10/18/19 0539  WBC 9.9  --   --   --  24.0* 17.9* 16.4*  NEUTROABS 7.6  --   --   --   --   --   --   HGB 14.2   < > 15.6* 13.6 16.0* 13.6 14.0  HCT 45.9   < > 46.0 40.0 50.8* 43.5 44.3  MCV 85.2  --   --   --  84.5 85.6 85.0  PLT 201  --   --   --  217 187 169   < > = values in this interval not displayed.    Basic Metabolic Panel: Recent Labs  Lab 10/15/19 1922 10/15/19 1922 10/15/19 1923 10/15/19 1956 10/16/19 0548 10/16/19 1653 10/17/19 0623 10/18/19 0539  NA 139   < > 142 140 143  --  142 146*  K 2.8*   < > 2.8* 3.0* 3.0*  --  3.0* 3.4*  CL 102  --  101  --  103  --  105 111  CO2 25  --   --   --  18*  --  24 24  GLUCOSE 218*  --  221*  --  193*  --  95 120*  BUN 9  --  11  --  11  --  33* 41*  CREATININE 0.80  --  0.70  --  0.80  --  2.13* 1.02*  CALCIUM 9.2  --   --   --  9.6  --  9.0 8.8*  MG  --   --   --   --   --  2.1 2.2 2.2  PHOS  --   --   --   --   --  4.5 4.4 3.8   < > = values in this interval not displayed.   GFR: Estimated Creatinine Clearance: 62.4 mL/min (A) (by C-G formula based on SCr of 1.02 mg/dL (H)). Recent Labs  Lab 10/15/19 1922 10/16/19 0548 10/17/19 0623 10/18/19 0539  WBC 9.9 24.0* 17.9* 16.4*    Liver Function Tests: Recent Labs  Lab 10/15/19 1922  AST 19  ALT 17  ALKPHOS 78  BILITOT 1.5*  PROT 7.5  ALBUMIN 4.3   No results for input(s): LIPASE, AMYLASE in the last 168 hours. No results for input(s): AMMONIA in the last 168 hours.  ABG    Component Value Date/Time   PHART 7.486 (H) 10/15/2019 1956   PCO2ART 35.7 10/15/2019 1956   PO2ART 72 (L) 10/15/2019 1956   HCO3 27.0 10/15/2019 1956   TCO2 28 10/15/2019 1956   O2SAT 95.0 10/15/2019 1956     Coagulation Profile: Recent Labs  Lab 10/15/19 1922  INR 1.0    Cardiac Enzymes: No results for input(s): CKTOTAL, CKMB, CKMBINDEX, TROPONINI in the last 168 hours.  HbA1C: Hgb A1c MFr Bld  Date/Time  Value Ref Range Status  10/15/2019 07:49 PM 9.1 (H) 4.8 - 5.6 % Final    Comment:    (NOTE)         Prediabetes: 5.7 - 6.4  Diabetes: >6.4         Glycemic control for adults with diabetes: <7.0   01/14/2019 06:43 PM 7.7 (H) 4.8 - 5.6 % Final    Comment:    (NOTE)         Prediabetes: 5.7 - 6.4         Diabetes: >6.4         Glycemic control for adults with diabetes: <7.0     CBG: Recent Labs  Lab 10/17/19 1205 10/17/19 1605 10/17/19 1937 10/18/19 0015 10/18/19 0327  GLUCAP 202* 146* 135* 148* 110*   Past Medical History  She,  has a past medical history of Arthritis, Chest wall pain, Diabetes mellitus without complication (HCC), and Hypertension.   Surgical History    Past Surgical History:  Procedure Laterality Date  . aneurism repair    . lapband       Social History   reports that she has never smoked. She has never used smokeless tobacco. She reports that she does not drink alcohol and does not use drugs.   Family History   Her family history includes Arthritis in her father and mother; Diabetes in her father and mother; Hypertension in her father and mother; Stroke in her mother.   Allergies Allergies  Allergen Reactions  . Hydrocodone Itching    Pt states "if i take 2, it makes me itch"     Home Medications  Prior to Admission medications   Medication Sig Start Date End Date Taking? Authorizing Provider  amLODipine (NORVASC) 10 MG tablet TAKE 1 TABLET BY MOUTH EVERY DAY 07/27/19   Myrlene Broker, MD  Ascorbic Acid (VITAMIN C) 100 MG tablet Take 100 mg by mouth daily.    [provider]  Benzoyl Peroxide 10 % CREA Use on face twice daily 10/05/19   Myrlene Broker, MD  carvedilol (COREG) 25 MG tablet TAKE 1 TAB 2 TIMES DAILY WITH A MEAL. OVERDUE FOR ANNUAL APPT MUST SEE PROVIDER FOR FUTURE REFILLS 10/05/19   Myrlene Broker, MD  CLEVER CHEK LANCETS MISC Use daily to check sugars. 08/30/15   Myrlene Broker, MD    diclofenac (VOLTAREN) 75 MG EC tablet TAKE 1 TABLET BY MOUTH TWICE A DAY Patient taking differently: Take 75 mg by mouth 2 (two) times daily.  10/30/18   Myrlene Broker, MD  Diethylpropion HCl 25 MG TABS Take 1 tablet by mouth every evening.    [provider]  Doxylamine Succinate, Sleep, (SLEEP AID PO) Take 1 tablet by mouth at bedtime.    [provider]  empagliflozin (JARDIANCE) 25 MG TABS tablet Take 25 mg by mouth daily. 06/02/19   Myrlene Broker, MD  fluconazole (DIFLUCAN) 150 MG tablet Take 1 tablet (150 mg total) by mouth every 3 (three) days. Patient taking differently: Take 150 mg by mouth every 3 (three) days. As needed if symptoms have not improved. 08/28/18   Myrlene Broker, MD  fluticasone Aleda Grana) 50 MCG/ACT nasal spray Place 1 spray daily as needed into both nostrils for allergies. 03/04/17   Myrlene Broker, MD  glucose blood (CLEVER CHOICE MICRO TEST) test strip Use as instructed 08/30/15   Myrlene Broker, MD  glucose blood (ONETOUCH VERIO) test strip 1 each by Other route 2 (two) times daily. Use to check blood sugars twice a day Dx E11.9 01/04/16   Myrlene Broker, MD  hydrALAZINE (APRESOLINE) 25 MG tablet Take 1 tablet (25 mg total) by mouth 3 (  three) times daily as needed (SBP >160, DBP>95). 01/17/19 02/16/19  Guilford Shi, MD  KLOR-CON M20 20 MEQ tablet TAKE 3 TABLETS BY MOUTH DAILY (NEED OFFICE VISIT AND LABS) 10/05/19   Hoyt Koch, MD  Multiple Vitamins-Minerals (MULTIVITAMIN ADULT) TABS Take 1 tablet by mouth daily.    [provider]  nystatin-triamcinolone ointment (MYCOLOG) APPLY TO AFFECTED AREA TWICE DAILY Patient taking differently: Apply 1 application topically 2 (two) times daily.  01/29/17   Hoyt Koch, MD  omeprazole (PRILOSEC) 40 MG capsule TAKE 1 CAPSULE BY MOUTH EVERY DAY Patient taking differently: Take 40 mg by mouth daily.  10/13/18   Hoyt Koch, MD  ondansetron  (ZOFRAN) 4 MG tablet Take 1 tablet (4 mg total) by mouth every 6 (six) hours as needed for nausea. 01/17/19   Guilford Shi, MD  [facility] DELICA LANCETS 20U MISC Use to help check blood sugars twice a day Dx E11.9 01/04/16   Hoyt Koch, MD  pioglitazone (ACTOS) 30 MG tablet TAKE 1 TABLET BY MOUTH EVERY DAY 05/18/19   Hoyt Koch, MD     Critical care time: 32 minutes    This patient is critically ill with acute encephalopathy from thalamic hemorrhage with intraventricular extension, worsening hydrocephalus requiring intubation; which, requires frequent high complexity decision making, assessment, support, evaluation, and titration of therapies. This was completed through the application of advanced monitoring technologies and extensive interpretation of multiple databases. During this encounter critical care time was devoted to patient care services described in this note for 32 minutes.  Hayden Pedro, AGACNP-BC [facility]  PCCM Pgr: [phone]

## 2019-10-18 NOTE — Progress Notes (Signed)
Patient ID: Latoya Cox, female   DOB: July 11, 1965, 54 y.o.   MRN: [id number] BP 137/86   Pulse 91   Temp 98.8 F (37.1 C) (Axillary)   Resp 18   SpO2 100%  Opens eyes, following commands Moving extremities, ventric is in place Stable exam

## 2019-10-19 ENCOUNTER — Ambulatory Visit: Payer: No Typology Code available for payment source | Admitting: Internal Medicine

## 2019-10-19 DIAGNOSIS — I610 Nontraumatic intracerebral hemorrhage in hemisphere, subcortical: Secondary | ICD-10-CM | POA: Diagnosis not present

## 2019-10-19 DIAGNOSIS — J9601 Acute respiratory failure with hypoxia: Secondary | ICD-10-CM | POA: Diagnosis not present

## 2019-10-19 LAB — BASIC METABOLIC PANEL
Anion gap: 11 (ref 5–15)
Anion gap: 9 (ref 5–15)
BUN: 40 mg/dL — ABNORMAL HIGH (ref 6–20)
BUN: 38 mg/dL — ABNORMAL HIGH (ref 6–20)
CO2: 23 mmol/L (ref 22–32)
CO2: 24 mmol/L (ref 22–32)
Calcium: 9.2 mg/dL (ref 8.9–10.3)
Calcium: 9.2 mg/dL (ref 8.9–10.3)
Chloride: 115 mmol/L — ABNORMAL HIGH (ref 98–111)
Chloride: 116 mmol/L — ABNORMAL HIGH (ref 98–111)
Creatinine, Ser: 0.94 mg/dL (ref 0.44–1.00)
Creatinine, Ser: 0.81 mg/dL (ref 0.44–1.00)
GFR calc Af Amer: >60 mL/min (ref >60)
GFR calc Af Amer: >60 mL/min (ref >60)
GFR calc non Af Amer: >60 mL/min (ref >60)
GFR calc non Af Amer: >60 mL/min (ref >60)
Glucose, Bld: 122 mg/dL — ABNORMAL HIGH (ref 70–99)
Glucose, Bld: 103 mg/dL — ABNORMAL HIGH (ref 70–99)
Potassium: 3.4 mmol/L — ABNORMAL LOW (ref 3.5–5.1)
Potassium: 4.1 mmol/L (ref 3.5–5.1)
Sodium: 149 mmol/L — ABNORMAL HIGH (ref 135–145)
Sodium: 149 mmol/L — ABNORMAL HIGH (ref 135–145)

## 2019-10-19 LAB — CBC
HCT: 41.9 % (ref 36.0–46.0)
Hemoglobin: 13 g/dL (ref 12.0–15.0)
MCH: 27.2 pg (ref 26.0–34.0)
MCHC: 31 g/dL (ref 30.0–36.0)
MCV: 87.7 fL (ref 80.0–100.0)
Platelets: 198 10*3/uL (ref 150–400)
RBC: 4.78 MIL/uL (ref 3.87–5.11)
RDW: 14.6 % (ref 11.5–15.5)
WBC: 14.9 10*3/uL — ABNORMAL HIGH (ref 4.0–10.5)
nRBC: 0 % (ref 0.0–0.2)

## 2019-10-19 LAB — PHOSPHORUS: Phosphorus: 4.1 mg/dL (ref 2.5–4.6)

## 2019-10-19 LAB — GLUCOSE, CAPILLARY
Glucose-Capillary: 145 mg/dL — ABNORMAL HIGH (ref 70–99)
Glucose-Capillary: 94 mg/dL (ref 70–99)
Glucose-Capillary: 208 mg/dL — ABNORMAL HIGH (ref 70–99)
Glucose-Capillary: 154 mg/dL — ABNORMAL HIGH (ref 70–99)

## 2019-10-19 LAB — MAGNESIUM
Magnesium: 2.4 mg/dL (ref 1.7–2.4)
Magnesium: 2.4 mg/dL (ref 1.7–2.4)

## 2019-10-19 MED ORDER — ENOXAPARIN SODIUM 40 MG/0.4ML ~~LOC~~ SOLN
40.0000 mg | SUBCUTANEOUS | Status: DC
  Administered 2019-10-19 – 2019-11-22 (×35): 40 mg via SUBCUTANEOUS
  Filled 2019-10-19 (×35): qty 0.4

## 2019-10-19 MED ORDER — FREE WATER
100.0000 mL | Status: DC
  Administered 2019-10-19 – 2019-10-21 (×13): 100 mL

## 2019-10-19 MED ORDER — HYDRALAZINE HCL 25 MG PO TABS
25.0000 mg | ORAL_TABLET | Freq: Three times a day (TID) | ORAL | Status: DC
  Administered 2019-10-19 – 2019-10-21 (×6): 25 mg
  Filled 2019-10-19 (×5): qty 1

## 2019-10-19 MED ORDER — POTASSIUM CHLORIDE 20 MEQ/15ML (10%) PO SOLN
40.0000 meq | ORAL | Status: AC
  Administered 2019-10-19 (×2): 40 meq
  Filled 2019-10-19 (×2): qty 30

## 2019-10-19 MED ORDER — FENTANYL CITRATE (PF) 100 MCG/2ML IJ SOLN: 50.0000 ug | INTRAMUSCULAR | Status: DC | PRN

## 2019-10-19 MED ORDER — LABETALOL HCL 5 MG/ML IV SOLN
10.0000 mg | INTRAVENOUS | Status: DC | PRN
  Administered 2019-10-21 – 2019-10-23 (×3): 10 mg via INTRAVENOUS
  Filled 2019-10-19 (×3): qty 4

## 2019-10-19 MED ORDER — HYDRALAZINE HCL 25 MG PO TABS
25.0000 mg | ORAL_TABLET | Freq: Three times a day (TID) | ORAL | Status: DC
  Filled 2019-10-19: qty 1

## 2019-10-19 NOTE — Evaluation (Signed)
Occupational Therapy Evaluation Patient Details Name: Latoya Cox MRN: [id number] DOB: 03-22-1966 Today's Date: 10/19/2019    History of Present Illness 53 y.o. female with DM, HTN and arthritis, [name] and depressed level of consciousness with garbled speech at home, after complaining of a headache earlier in the day. STAT CT head was obtained, revealing an ICH originating from the left basal ganglila, extending into the left lateral, third and 4th ventricles, with a small amount of blood in the right lateral ventricle as well. Early stages of hydrocephalus were also noted on CT. Pt underwent L frontal IVC placement and intubation on 6/18.   Clinical Impression   Pt admitted with the above diagnosis and has the deficits listed below. Pt would benefit from cont OT to increase independence with basic adls and adl transfers so pt can eventually d/c home with her husband to their home after rehab.  Feel with severity of pts deficits, rehab will be best option to maximize her independence before returning home.  Next session to focus on EOB adls and simple standing tasks.     Follow Up Recommendations  CIR;Supervision/Assistance - 24 hour    Equipment Recommendations  Other (comment) (tbd)    Recommendations for Other Services       Precautions / Restrictions Precautions Precautions: Fall;Other (comment) Precaution Comments: EVD, ETT Restrictions Weight Bearing Restrictions: No      Mobility Bed Mobility Overal bed mobility: Needs Assistance Bed Mobility: Supine to Sit;Sit to Supine     Supine to sit: Mod assist;+2 for physical assistance Sit to supine: Total assist;+2 for physical assistance   General bed mobility comments: Pt fatigued near end of session due to sitting on EOB for 8+ minutes therefore requiring more assist at end of session than beginning.  Transfers Overall transfer level: Needs  assistance Equipment used: 2 person hand held assist Transfers: Lateral/Scoot Transfers          Lateral/Scoot Transfers: +2 physical assistance;Mod assist General transfer comment: Pt laterally moved up in the bed with +2 assist. Pt did not come to full stand but did clear the bed when scooting sideways.    Balance Overall balance assessment: Needs assistance Sitting-balance support: Feet supported;Single extremity supported Sitting balance-Leahy Scale: Poor Sitting balance - Comments: Pt used LUE to hold to end of bed to keep self in sitting.  At times, pt could sit for a few seconds with supervision. Postural control: Right lateral lean Standing balance support: During functional activity;Bilateral upper extremity supported Standing balance-Leahy Scale: Zero Standing balance comment: Pt required full outside support to attempt standing.  pt did not quite get to full standing position; more of a squat.                            ADL either performed or assessed with clinical judgement   ADL Overall ADL's : Needs assistance/impaired Eating/Feeding: NPO   Grooming: Wash/dry face;Maximal assistance;Sitting Grooming Details (indicate cue type and reason): Pt sat EOB and hand over hand washed face with max assist at EOBL. Upper Body Bathing: Maximal assistance;Sitting;Bed level   Lower Body Bathing: Total assistance;+2 for physical assistance;Sit to/from stand   Upper Body Dressing : Total assistance;Sitting   Lower Body Dressing: Total assistance;+2 for physical assistance;Bed level   Toilet Transfer: Maximal assistance;+2 for physical assistance;BSC;Squat-pivot   Toileting- Clothing Manipulation and Hygiene: Total assistance;+2 for physical assistance;Sit to/from stand  Functional mobility during ADLs: Maximal assistance;+2 for physical assistance General ADL Comments: Pt participating in adls despite ventilator.  Attempted washing face, squat to stand in  prep for toileting.      Vision Baseline Vision/History:  (unsure) Patient Visual Report: Other (comment) (unsure) Vision Assessment?: Vision impaired- to be further tested in functional context Additional Comments: Pt seems to have a r gaze preference but feel this is more because vent is on the right side of her so head turned that way from tubes.     Perception     Praxis Praxis Praxis tested?: Not tested    Pertinent Vitals/Pain Pain Assessment: Faces Faces Pain Scale: Hurts little more Pain Location: all over. yes to head and back and all over Pain Descriptors / Indicators: Aching Pain Intervention(s): Limited activity within patient's tolerance;Monitored during session;Repositioned     Hand Dominance Right   Extremity/Trunk Assessment Upper Extremity Assessment Upper Extremity Assessment: RUE deficits/detail;LUE deficits/detail RUE Deficits / Details: PROM WFL.  Strength around 2/5 throughout.  Did grip to command and attempted to move arm over head.   RUE Coordination: decreased fine motor;decreased gross motor LUE Deficits / Details: PROM WFL.  Pt appears to have full AROM with strength at least 3+/5. LUE Sensation: WNL   Lower Extremity Assessment Lower Extremity Assessment: Defer to PT evaluation   Cervical / Trunk Assessment Cervical / Trunk Assessment: Other exceptions Cervical / Trunk Exceptions: Pt with R neck rotation as ETT on the right.   Communication Communication Communication: Other (comment) (pt intubated)   Cognition Arousal/Alertness: Lethargic Behavior During Therapy: Flat affect Overall Cognitive Status: Impaired/Different from baseline Area of Impairment: Safety/judgement;Following commands;Awareness;Problem solving;Orientation;Attention                 Orientation Level:  (pt stated she knew she was in hosptial in [place]) Current Attention Level: Focused   Following Commands: Follows one step commands consistently (consistently for  first 15 minutes then fatigued.) Safety/Judgement: Decreased awareness of deficits;Decreased awareness of safety Awareness: Intellectual Problem Solving: Slow processing;Requires verbal cues General Comments: Pt much more interactive today moving limbs when asked, answering yes/no questions more appropriately.  Stated she knew she was in hospital in [place] and that she did not live alone.   General Comments  Pt with good participation today and following most simple commands before fatiguing at end of session.    Exercises     Shoulder Instructions      Home Living Family/patient expects to be discharged to:: Inpatient rehab Living Arrangements: Spouse/significant other;Children                                      Prior Functioning/Environment Level of Independence: Independent        Comments: No family present         OT Problem List: Decreased strength;Decreased activity tolerance;Impaired balance (sitting and/or standing);Impaired vision/perception;Decreased coordination;Decreased cognition;Decreased safety awareness;Decreased knowledge of use of DME or AE;Decreased knowledge of precautions;Cardiopulmonary status limiting activity;Impaired UE functional use;Pain      OT Treatment/Interventions: Self-care/ADL training;Neuromuscular education;Therapeutic activities;Balance training    OT Goals(Current goals can be found in the care plan section) Acute Rehab OT Goals Patient Stated Goal: unable to state due to vent OT Goal Formulation: Patient unable to participate in goal setting Time For Goal Achievement: 11/02/19 Potential to Achieve Goals: Good ADL Goals Pt Will Perform Eating: with supervision;sitting Pt Will Perform Grooming: with  supervision;sitting Pt Will Perform Upper Body Bathing: with min assist;sitting Pt Will Transfer to Toilet: with mod assist;bedside commode;stand pivot transfer Additional ADL Goal #1: Pt will sit at EOB to complete one  grooming task for 5 minutes with min guard for balance. Additional ADL Goal #2: Pt will follow 2 step commands Ily to increase participation in therapies.  OT Frequency: Min 2X/week   Barriers to D/C:    pt states she does live with someone       Co-evaluation PT/OT/SLP Co-Evaluation/Treatment: Yes Reason for Co-Treatment: Complexity of the patient's impairments (multi-system involvement) PT goals addressed during session: Mobility/safety with mobility OT goals addressed during session: ADL's and self-care      AM-PAC OT "6 Clicks" Daily Activity     Outcome Measure Help from another person eating meals?: Total Help from another person taking care of personal grooming?: Total Help from another person toileting, which includes using toliet, bedpan, or urinal?: Total Help from another person bathing (including washing, rinsing, drying)?: Total Help from another person to put on and taking off regular upper body clothing?: Total Help from another person to put on and taking off regular lower body clothing?: Total 6 Click Score: 6   End of Session Equipment Utilized During Treatment: Other (comment) (on vent) Nurse Communication: Mobility status  Activity Tolerance: Patient limited by lethargy Patient left: in bed;with call bell/phone within reach  OT Visit Diagnosis: Other abnormalities of gait and mobility (R26.89);Hemiplegia and hemiparesis;Other symptoms and signs involving the nervous system (R29.898) Hemiplegia - Right/Left: Right Hemiplegia - dominant/non-dominant: Dominant Hemiplegia - caused by: Nontraumatic intracerebral hemorrhage                Time: 9485-4627 OT Time Calculation (min): 21 min Charges:  OT General Charges $OT Visit: 1 Visit OT Evaluation $OT Eval Moderate Complexity: 1 Mod  Glenford Peers 10/19/2019, 10:03 AM

## 2019-10-19 NOTE — Progress Notes (Signed)
Frequent PVCs noted on telemetry. Heart rate 100s with variable pulse rate, 40s-100s. Elink MD notified, orders received for BMP.   Febrile 100.7, tylenol administered per tube.

## 2019-10-19 NOTE — Progress Notes (Signed)
Subjective: Patient reports (vent)  Objective: Vital signs in last 24 hours: Temp:  [97.5 F (36.4 C)-99.6 F (37.6 C)] 97.5 F (36.4 C) (06/21 0333) Pulse Rate:  [74-99] 76 (06/21 0738) Resp:  [12-27] 18 (06/21 0738) BP: (109-162)/(79-109) 130/83 (06/21 0738) SpO2:  [96 %-100 %] 100 % (06/21 0738) FiO2 (%):  [40 %] 40 % (06/21 0738)  Intake/Output from previous day: 06/20 0701 - 06/21 0700 In: 588 [I.V.:8; NG/GT:580] Out: 1466 [Urine:1400; Drains:66] Intake/Output this shift: No intake/output data recorded.  Opens eyes to command, nods in respopnse to questions. Moves all extremities to command, poor dexterity/hand intrinsics right. Mitten left hand for pt safety. Ventric patent, with bloody drainage.   Lab Results: Recent Labs    10/18/19 0539 10/19/19 0457  WBC 16.4* 14.9*  HGB 14.0 13.0  HCT 44.3 41.9  PLT 169 198   BMET Recent Labs    10/18/19 0539 10/19/19 0457  NA 146* 149*  K 3.4* 3.4*  CL 111 115*  CO2 24 23  GLUCOSE 120* 122*  BUN 41* 40*  CREATININE 1.02* 0.94  CALCIUM 8.8* 9.2    Studies/Results: DG Chest Port 1 View  Result Date: 10/18/2019 CLINICAL DATA:  Respiratory failure.  Evaluate pneumo. EXAM: PORTABLE CHEST 1 VIEW COMPARISON:  October 16, 2019 FINDINGS: The ETT is in good position. The NG tube terminates below today's film. No pneumothorax. The lungs are clear. Stable cardiomegaly. The hila and mediastinum are unchanged. IMPRESSION: 1. Support apparatus as above. 2. No other acute abnormalities. Electronically Signed   By: Gerome Sam III M.D   On: 10/18/2019 12:05    Assessment/Plan:   LOS: 4 days  Continue support.   Georgiann Cocker 10/19/2019, 8:06 AM

## 2019-10-19 NOTE — Progress Notes (Addendum)
NAME:  Latoya Cox, MRN:  [id number], DOB:  07/21/1965, LOS: 4 ADMISSION DATE:  10/15/2019, CONSULTATION DATE:  10/16/19 REFERRING MD:  Otelia LimesLindzen, CHIEF COMPLAINT:  Acute encephalopathy  Brief History   54yF with acute encephalopathy from thalamic hemorrhage with intraventricular extension, worsening hydrocephalus requiring EVD and intubation this morning.   History of present illness   54yF with DM, HTN, arthritis who presented to ED 6/17 for R sided weakness and lethargy, change in speech after a headache earlier on day of admission. Much of history if obtained through chart review as she is unable to participate in interview at time of my exam due to her encephaloapthy. On arrival to EMS R facial droop also noted, some nausea and emesis. CTH showed ICH from left basal ganglia with extension into left lateral, third and 4th ventricles, with a small amount of blood in the right lateral ventricle as well, some early hydrocephalus.   On arrival to ICU she was started on cleviprex for SBP 100-140. Her exam deteriorated however and CTH showed worsening hydrocephalus. She has also had intermittent tachycardia which appears to be sinus on the EKGs available for review, including one with HR 146.  Past Medical History  DM HTN Arthritis  Significant Hospital Events   6/18 intubation 6/18 EVD placement  Consults:  PCCM Neurosurgery  Procedures:  6/18 intubation 6/18 EVD placement  Significant [facility] Tests[facility]  [facility] obstructive hydrocephalus appears slightly worsened. 5mm left to right midline shift at septum pellucidum. No chagne in IPH size CTA Head 6/19: 1. Stable size of left thalamic hemorrhage. 2. Increased intraventricular blood. With slight increase in midline shift 3. Left frontal ventriculostomy catheter is in place. 4. No significant residual or recurrent aneurysm. 5. No significant proximal stenosis, aneurysm, or branch vessel occlusion within the Circle of Willis.  Normal CTA of the head. No focal etiology for the hemorrhage.  Micro Data:  MRSA PCR 6/18 > Negative   Antimicrobials:  None  Interim history/subjective:  Overnight with some agitation, per staff trying to pull at EVD > improved with fentanyl PRN dose.   Objective   Blood pressure 130/83, pulse 76, temperature (!) 97.5 F (36.4 C), temperature source Axillary, resp. rate 18, SpO2 100 %.    Vent Mode: PRVC FiO2 (%):  [40 %] 40 % Set Rate:  [18 bmp] 18 bmp Vt Set:  [400 mL] 400 mL PEEP:  [5 cmH20] 5 cmH20 Plateau Pressure:  [13 cmH20-14 cmH20] 14 cmH20   Intake/Output Summary (Last 24 hours) at 10/19/2019 0806 Last data filed at 10/19/2019 0700 Gross per 24 hour  Intake 587.97 ml  Output 1461 ml  Net -873.03 ml   There were no vitals filed for this visit.  Examination: General: Critically ill, lying in bed  HENT: EVD in place >> Clean, Dry, ETT/OG in place   Lungs: Clear breath sounds, no wheeze/crackles, vent assisted breaths   Cardiovascular: RRR, no MRG Abdomen: soft, nontender, active bowel sounds  Extremities: -edema  Neuro: opens eyes to verbal/physical stimulation, lethargic, follows commands. LUE 5/5, LLE 4/5, RLE 3/5, RUE 1/5   Resolved Hospital Problem list   n/a  Assessment & Plan:   Acute hypoxic respiratory failure: intubated for airway protection Plan - Vent Support > Mentation barrier to extubation, Failed SBT today again today due to apnea >> remains off continous sedation, needed 100 mcg fentanyl x 3 overnight for agitation.  - VAP Bundle  - Trend CXR  - PRN Fentanyl, Precedex (currently off)  ICH with IVH with worsening hydrocephalus:s/p EVD in setting of HTN Repeat CTA Head with stable Left Thalamic Hemorrage and increase size in IVH with increase of midline shift  H/O Aneurysm s/p Clip  Plan - Per Neurology and Neurosurgery,  - Continue Norvasc, Coreg Add scheduled Hydralazine  - Systolic Goal <140. PRN Labetalol   Hypokalemia    Hypernatremia  Plan  -Trend BMP -Replacing K now  -Free Water increased to 100 q4h   DM Plan -Levemir, SSI, and TF Coverage   Remaining Management per Primary Team  Best practice:  Diet: TF Pain/Anxiety/Delirium protocol (if indicated): yes  VAP protocol (if indicated): yes DVT prophylaxis: holding, SCDs GI prophylaxis: PPI  Glucose control: SSI Mobility: bed level Code Status: Full Family Communication: Will Update.  Disposition: ICU  Labs   CBC: Recent Labs  Lab 10/15/19 1922 10/15/19 1923 10/15/19 1956 10/16/19 0548 10/17/19 0623 10/18/19 0539 10/19/19 0457  WBC 9.9  --   --  24.0* 17.9* 16.4* 14.9*  NEUTROABS 7.6  --   --   --   --   --   --   HGB 14.2   < > 13.6 16.0* 13.6 14.0 13.0  HCT 45.9   < > 40.0 50.8* 43.5 44.3 41.9  MCV 85.2  --   --  84.5 85.6 85.0 87.7  PLT 201  --   --  217 187 169 198   < > = values in this interval not displayed.    Basic Metabolic Panel: Recent Labs  Lab 10/15/19 1922 10/15/19 1922 10/15/19 1923 10/15/19 1923 10/15/19 1956 10/16/19 0548 10/16/19 1653 10/17/19 0623 10/18/19 0539 10/19/19 0457  NA 139   < > 142   < > 140 143  --  142 146* 149*  K 2.8*   < > 2.8*   < > 3.0* 3.0*  --  3.0* 3.4* 3.4*  CL 102   < > 101  --   --  103  --  105 111 115*  CO2 25  --   --   --   --  18*  --  24 24 23   GLUCOSE 218*   < > 221*  --   --  193*  --  95 120* 122*  BUN 9   < > 11  --   --  11  --  33* 41* 40*  CREATININE 0.80   < > 0.70  --   --  0.80  --  2.13* 1.02* 0.94  CALCIUM 9.2  --   --   --   --  9.6  --  9.0 8.8* 9.2  MG  --   --   --   --   --   --  2.1 2.2 2.2 2.4  PHOS  --   --   --   --   --   --  4.5 4.4 3.8 4.1   < > = values in this interval not displayed.   GFR: Estimated Creatinine Clearance: 67.7 mL/min (by C-G formula based on SCr of 0.94 mg/dL). Recent Labs  Lab 10/16/19 0548 10/17/19 0623 10/18/19 0539 10/19/19 0457  WBC 24.0* 17.9* 16.4* 14.9*    Liver Function Tests: Recent Labs  Lab  10/15/19 1922  AST 19  ALT 17  ALKPHOS 78  BILITOT 1.5*  PROT 7.5  ALBUMIN 4.3   No results for input(s): LIPASE, AMYLASE in the last 168 hours. No results for input(s): AMMONIA in the last 168 hours.  ABG    Component Value Date/Time   PHART 7.486 (H) 10/15/2019 1956   PCO2ART 35.7 10/15/2019 1956   PO2ART 72 (L) 10/15/2019 1956   HCO3 27.0 10/15/2019 1956   TCO2 28 10/15/2019 1956   O2SAT 95.0 10/15/2019 1956     Coagulation Profile: Recent Labs  Lab 10/15/19 1922  INR 1.0    Cardiac Enzymes: No results for input(s): CKTOTAL, CKMB, CKMBINDEX, TROPONINI in the last 168 hours.  HbA1C: Hgb A1c MFr Bld  Date/Time Value Ref Range Status  10/15/2019 07:49 PM 9.1 (H) 4.8 - 5.6 % Final    Comment:    (NOTE)         Prediabetes: 5.7 - 6.4         Diabetes: >6.4         Glycemic control for adults with diabetes: <7.0   01/14/2019 06:43 PM 7.7 (H) 4.8 - 5.6 % Final    Comment:    (NOTE)         Prediabetes: 5.7 - 6.4         Diabetes: >6.4         Glycemic control for adults with diabetes: <7.0     CBG: Recent Labs  Lab 10/18/19 1614 10/18/19 2015 10/18/19 2332 10/19/19 0334 10/19/19 0758  GLUCAP 158* 111* 157* 145* 94   Past Medical History  She,  has a past medical history of Arthritis, Chest wall pain, Diabetes mellitus without complication (HCC), and Hypertension.   Surgical History    Past Surgical History:  Procedure Laterality Date  . aneurism repair    . lapband       Social History   reports that she has never smoked. She has never used smokeless tobacco. She reports that she does not drink alcohol and does not use drugs.   Family History   Her family history includes Arthritis in her father and mother; Diabetes in her father and mother; Hypertension in her father and mother; Stroke in her mother.   Allergies Allergies  Allergen Reactions  . Hydrocodone Itching    Pt states "if i take 2, it makes me itch"     Home Medications    Prior to Admission medications   Medication Sig Start Date End Date Taking? Authorizing Provider  amLODipine (NORVASC) 10 MG tablet TAKE 1 TABLET BY MOUTH EVERY DAY 07/27/19   Myrlene Broker, MD  Ascorbic Acid (VITAMIN C) 100 MG tablet Take 100 mg by mouth daily.    [provider]  Benzoyl Peroxide 10 % CREA Use on face twice daily 10/05/19   Myrlene Broker, MD  carvedilol (COREG) 25 MG tablet TAKE 1 TAB 2 TIMES DAILY WITH A MEAL. OVERDUE FOR ANNUAL APPT MUST SEE PROVIDER FOR FUTURE REFILLS 10/05/19   Myrlene Broker, MD  CLEVER CHEK LANCETS MISC Use daily to check sugars. 08/30/15   Myrlene Broker, MD  diclofenac (VOLTAREN) 75 MG EC tablet TAKE 1 TABLET BY MOUTH TWICE A DAY Patient taking differently: Take 75 mg by mouth 2 (two) times daily.  10/30/18   Myrlene Broker, MD  Diethylpropion HCl 25 MG TABS Take 1 tablet by mouth every evening.    [provider]  Doxylamine Succinate, Sleep, (SLEEP AID PO) Take 1 tablet by mouth at bedtime.    [provider]  empagliflozin (JARDIANCE) 25 MG TABS tablet Take 25 mg by mouth daily. 06/02/19   Myrlene Broker, MD  fluconazole (DIFLUCAN) 150 MG tablet Take  1 tablet (150 mg total) by mouth every 3 (three) days. Patient taking differently: Take 150 mg by mouth every 3 (three) days. As needed if symptoms have not improved. 08/28/18   Myrlene Broker, MD  fluticasone Aleda Grana) 50 MCG/ACT nasal spray Place 1 spray daily as needed into both nostrils for allergies. 03/04/17   Myrlene Broker, MD  glucose blood (CLEVER CHOICE MICRO TEST) test strip Use as instructed 08/30/15   Myrlene Broker, MD  glucose blood (ONETOUCH VERIO) test strip 1 each by Other route 2 (two) times daily. Use to check blood sugars twice a day Dx E11.9 01/04/16   Myrlene Broker, MD  hydrALAZINE (APRESOLINE) 25 MG tablet Take 1 tablet (25 mg total) by mouth 3 (three) times daily as needed (SBP >160, DBP>95).  01/17/19 02/16/19  Alessandra Bevels, MD  KLOR-CON M20 20 MEQ tablet TAKE 3 TABLETS BY MOUTH DAILY (NEED OFFICE VISIT AND LABS) 10/05/19   Myrlene Broker, MD  Multiple Vitamins-Minerals (MULTIVITAMIN ADULT) TABS Take 1 tablet by mouth daily.    [provider]  nystatin-triamcinolone ointment (MYCOLOG) APPLY TO AFFECTED AREA TWICE DAILY Patient taking differently: Apply 1 application topically 2 (two) times daily.  01/29/17   Myrlene Broker, MD  omeprazole (PRILOSEC) 40 MG capsule TAKE 1 CAPSULE BY MOUTH EVERY DAY Patient taking differently: Take 40 mg by mouth daily.  10/13/18   Myrlene Broker, MD  ondansetron (ZOFRAN) 4 MG tablet Take 1 tablet (4 mg total) by mouth every 6 (six) hours as needed for nausea. 01/17/19   Alessandra Bevels, MD  [facility] DELICA LANCETS 33G MISC Use to help check blood sugars twice a day Dx E11.9 01/04/16   Myrlene Broker, MD  pioglitazone (ACTOS) 30 MG tablet TAKE 1 TABLET BY MOUTH EVERY DAY 05/18/19   Myrlene Broker, MD     Critical care time: 32 minutes    This patient is critically ill with acute encephalopathy from thalamic hemorrhage with intraventricular extension, worsening hydrocephalus requiring intubation; which, requires frequent high complexity decision making, assessment, support, evaluation, and titration of therapies. This was completed through the application of advanced monitoring technologies and extensive interpretation of multiple databases. During this encounter critical care time was devoted to patient care services described in this note for 32 minutes.  Jovita Kussmaul, AGACNP-BC [facility]  PCCM Pgr: [phone]    PCCM:  54 yo AHRF on vent, ICH with IVH with hydroceph s/p EVD. Not weaning from vent. More awake today. Avoiding sedating meds  BP 121/78   Pulse 88   Temp 98.9 F (37.2 C) (Axillary)   Resp (!) 21   SpO2 100%   Gen: FM, intubated, on vent  HENT: EVD in  head, ETT in place  Heart: RRR, s1 s2 Lungs: BL vented breaths  Neuro: moves around, still not breathing enough to come of vent, low MVe in PS, moves all 4 spontaneously   Labs reviewed   A:  AHRF on MV, Acute metabolic encephalopathy secondary to ICH  ICH with IVH, s/p EVD  DMII  HypoK Hypernatremia   P: Mental status precludes liberation at this time  Avoid opiates Discussed with nursing  Would like to give her a shot liberated from vent tomorrow AM if possible.  This patient is critically ill with multiple organ system failure; which, requires frequent high complexity decision making, assessment, support, evaluation, and titration of therapies. This was completed through the application of advanced monitoring technologies and  extensive interpretation of multiple databases. During this encounter critical care time was devoted to patient care services described in this note for 31 minutes.  [facility] 10/19/2019 4:03 PM

## 2019-10-19 NOTE — Progress Notes (Signed)
eLink Physician-Brief Progress Note Patient Name: Latoya Cox DOB: Jul 22, 1965 MRN: [id number]   Date of Service  10/19/2019  HPI/Events of Note  Frequent PVC's - BP = 160/93.   eICU Interventions  Plan: 1. BMP and Mg++ level now.      Intervention Category Major Interventions: Arrhythmia - evaluation and management  Morene Cecilio Dennard Nip 10/19/2019, 9:11 PM

## 2019-10-19 NOTE — Progress Notes (Signed)
Physical Therapy Treatment Patient Details Name: Latoya Cox MRN: [id number] DOB: 04-Sep-1965 Today's Date: 10/19/2019    History of Present Illness 54 y.o. female with DM, HTN and arthritis, [name] and depressed level of consciousness with garbled speech at home, after complaining of a headache earlier in the day. STAT CT head was obtained, revealing an ICH originating from the left basal ganglila, extending into the left lateral, third and 4th ventricles, with a small amount of blood in the right lateral ventricle as well. Early stages of hydrocephalus were also noted on CT. Pt underwent L frontal IVC placement and intubation on 6/18.    PT Comments    Patient progressing this session much more alert and interactive and able to follow some simple commands until fatigued.  She nods her head no to living alone so hopeful to have assist at d/c following CIR level rehab stay.  PT to continue to follow acutely.   Follow Up Recommendations  CIR     Equipment Recommendations  Hospital bed;Wheelchair (measurements PT);Wheelchair cushion (measurements PT)    Recommendations for Other Services       Precautions / Restrictions Precautions Precautions: Fall;Other (comment) Precaution Comments: EVD, ETT    Mobility  Bed Mobility Overal bed mobility: Needs Assistance Bed Mobility: Supine to Sit;Sit to Supine     Supine to sit: Mod assist;+2 for physical assistance Sit to supine: Total assist;+2 for physical assistance   General bed mobility comments: Pt fatigued near end of session due to sitting on EOB for 8+ minutes therefore requiring more assist at end of session than beginning.  Transfers Overall transfer level: Needs assistance Equipment used: 2 person hand held assist Transfers: Lateral/Scoot Transfers          Lateral/Scoot Transfers: +2 physical assistance;Mod assist General transfer comment: Pt laterally  moved up in the bed with +2 assist. Pt did not come to full stand but did clear the bed when scooting sideways.  Ambulation/Gait                 Stairs             Wheelchair Mobility    Modified Rankin (Stroke Patients Only) Modified Rankin (Stroke Patients Only) Pre-Morbid Rankin Score: No symptoms Modified Rankin: Severe disability     Balance Overall balance assessment: Needs assistance Sitting-balance support: Feet supported;Single extremity supported Sitting balance-Leahy Scale: Poor Sitting balance - Comments: Pt used LUE to hold to end of bed to keep self in sitting.  At times, pt could sit for a few seconds with supervision. Postural control: Right lateral lean Standing balance support: During functional activity;Bilateral upper extremity supported Standing balance-Leahy Scale: Zero Standing balance comment: Pt required full outside support to attempt standing.  pt did not quite get to full standing position; more of a squat.                             Cognition Arousal/Alertness: Lethargic Behavior During Therapy: Flat affect Overall Cognitive Status: Impaired/Different from baseline Area of Impairment: Safety/judgement;Following commands;Awareness;Problem solving;Orientation;Attention                 Orientation Level:  (shook her head to being in hospital in [place]) Current Attention Level: Focused   Following Commands: Follows one step commands consistently Safety/Judgement: Decreased awareness of deficits;Decreased awareness of safety Awareness: Intellectual Problem Solving: Slow processing;Requires verbal cues General  Comments: Pt much more interactive today moving limbs when asked, answering yes/no questions more appropriately.  Stated she knew she was in hospital in [place] and that she did not live alone.      Exercises      General Comments General comments (skin integrity, edema, etc.): participated well today  following commands to initially keep eyes open and engaged to work on balance at EOB until fatigued      Pertinent Vitals/Pain Pain Assessment: Faces Faces Pain Scale: Hurts little more Pain Location: all over. yes to head and back and all over Pain Descriptors / Indicators: Aching Pain Intervention(s): Monitored during session;Limited activity within patient's tolerance;Repositioned    Home Living                      Prior Function            PT Goals (current goals can now be found in the care plan section) Progress towards PT goals: Progressing toward goals    Frequency    Min 4X/week      PT Plan Current plan remains appropriate    Co-evaluation PT/OT/SLP Co-Evaluation/Treatment: Yes Reason for Co-Treatment: For patient/therapist safety;Complexity of the patient's impairments (multi-system involvement);To address functional/ADL transfers PT goals addressed during session: Mobility/safety with mobility;Balance;Strengthening/ROM        AM-PAC PT "6 Clicks" Mobility   Outcome Measure  Help needed turning from your back to your side while in a flat bed without using bedrails?: A Lot Help needed moving from lying on your back to sitting on the side of a flat bed without using bedrails?: A Lot Help needed moving to and from a bed to a chair (including a wheelchair)?: Total Help needed standing up from a chair using your arms (e.g., wheelchair or bedside chair)?: Total Help needed to walk in hospital room?: Total Help needed climbing 3-5 steps with a railing? : Total 6 Click Score: 8    End of Session Equipment Utilized During Treatment: Oxygen Activity Tolerance: Patient limited by fatigue Patient left: in bed;with call bell/phone within reach Nurse Communication: Mobility status PT Visit Diagnosis: Other abnormalities of gait and mobility (R26.89);Muscle weakness (generalized) (M62.81);Other symptoms and signs involving the nervous system (O70.962)      Time: 8366-2947 PT Time Calculation (min) (ACUTE ONLY): 27 min  Charges:  $Therapeutic Activity: 8-22 mins                     Magda Kiel, PT Acute Rehabilitation Services Pager:[phone] [phone] 10/19/2019    Reginia Naas 10/19/2019, 1:38 PM

## 2019-10-19 NOTE — Progress Notes (Signed)
STROKE TEAM PROGRESS NOTE   INTERVAL HISTORY Patient remains intubated and on ventilatory support for respiratory failure.  Blood pressure adequately controlled.  CT angiogram of the brain and neck shows no significant stenosis or aneurysm.  CT scan shows slight increase in intraventricular hemorrhage.  Carotid ultrasound is unremarkable.  2D echo shows normal ejection fraction.  Urine drug screen is negative.  Patient received 3 doses of fentanyl yesterday and appears quite drowsy and sleepy but can be easily aroused and follows commands.  OBJECTIVE Vitals:   10/19/19 0630 10/19/19 0700 10/19/19 0738 10/19/19 0800  BP: 109/79 122/84 130/83 (!) 131/91  Pulse: 74 84 76 77  Resp: 18 19 18 18   Temp:    97.8 F (36.6 C)  TempSrc:    Axillary  SpO2: 100% 100% 100% 100%    CBC:  Recent Labs  Lab 10/15/19 1922 10/15/19 1923 10/18/19 0539 10/19/19 0457  WBC 9.9   < > 16.4* 14.9*  NEUTROABS 7.6  --   --   --   HGB 14.2   < > 14.0 13.0  HCT 45.9   < > 44.3 41.9  MCV 85.2   < > 85.0 87.7  PLT 201   < > 169 198   < > = values in this interval not displayed.    Basic Metabolic Panel:  Recent Labs  Lab 10/18/19 0539 10/19/19 0457  NA 146* 149*  K 3.4* 3.4*  CL 111 115*  CO2 24 23  GLUCOSE 120* 122*  BUN 41* 40*  CREATININE 1.02* 0.94  CALCIUM 8.8* 9.2  MG 2.2 2.4  PHOS 3.8 4.1    Lipid Panel:     Component Value Date/Time   CHOL 274 (H) 10/15/2019 1949   TRIG 287 (H) 10/18/2019 0539   HDL 64 10/15/2019 1949   CHOLHDL 4.3 10/15/2019 1949   VLDL 24 10/15/2019 1949   LDLCALC 186 (H) 10/15/2019 1949   HgbA1c:  Lab Results  Component Value Date   HGBA1C 9.1 (H) 10/15/2019   Urine Drug Screen:     Component Value Date/Time   LABOPIA NONE DETECTED 10/16/2019 2204   COCAINSCRNUR NONE DETECTED 10/16/2019 2204   LABBENZ POSITIVE (A) 10/16/2019 2204   AMPHETMU NONE DETECTED 10/16/2019 2204   THCU NONE DETECTED 10/16/2019 2204   LABBARB NONE DETECTED 10/16/2019 2204     Alcohol Level     Component Value Date/Time   ETH <10 10/15/2019 1922    IMAGING past 24h No results found.   PHYSICAL EXAM    General - Well nourished, well developed middle-aged African-American lady, intubated not on sedation  Ophthalmologic - fundi not visualized due to noncooperation.  Cardiovascular - Regular rate and rhythm.  Neuro - intubated drowsy but can easily aroused when stimulated and does follow commands.  Oculocephalic reflexes are intact.  Pupils are reactive to light. Corneal reflex weak bilaterally, gag and cough present. Breathing over the vent.-But only slightly.  Facial symmetry not able to test due to ET tube.  Tongue protrusion not cooperative. Spontaneously moving left UE and LE and withdraw to pain with at least 3/5 strength, RUE 1/5 and RLE 3/5; DTR 1+ and no babinski. Sensation, coordination and gait not tested.    ASSESSMENT/PLAN [name] is a 54 y.o. female with DM, HTN, previous hx of aneurysm repair and arthritis, presenting with acute onset of right sided weakness and depressed level of consciousness with garbled speech after complaining of a headache earlier in the day.  ICH with IVH s.p EVD - left thalamic likely due to HTN   CT Head - Acute intraparenchymal hemorrhage in the left thalamus, 9.6 cc. Intraventricular penetration. Early dilatation of the lateral ventricles.     CT head - No significant interval change in size and morphology of acute intraparenchymal hemorrhage emanating from the left thalamus, estimated volume 11 CC. 5 mm MLS. IVH similar to previous. Associated obstructive hydrocephalus appears slightly worsened from previous.   MRI head - not able to perform due to previous aneurysm clips  CTA Head 6/19 -  Stable size of left thalamic hemorrhage. Increased intraventricular blood. With slight increase in midline shift. Left frontal ventriculostomy catheter is in place. No significant residual or recurrent aneurysm. No  significant proximal stenosis, aneurysm, or branch vessel occlusion within the Circle of Willis. Normal CTA of the head. No focal etiology for the hemorrhage.  Carotid Doppler - unremarkable  2D Echo - EF 60 - 65%. No cardiac source of emboli identified.  Sars Corona Virus 2  - negative  LDL - 186  HgbA1c - 7.7  UDS - benzodiazepine  VTE prophylaxis - Lovenox 40 mg sq daily   No antithrombotic prior to admission, now on No antithrombotic  Therapy recommendations:  CIR   Disposition:  Pending  Obstructive hydrocephalus s/p EVD  CT repeat showed worsening obstructive hydrocephalus  Neurosurgery on board  Status post EVD Latoya Cox)  Repeat CTA 6/19 - Stable size of left thalamic hemorrhage. Increased intraventricular blood. With slight increase in midline shift. Left frontal ventriculostomy catheter is in place. No significant residual or recurrent aneurysm. No significant proximal stenosis, aneurysm, or branch vessel occlusion within the Circle of Willis. Normal CTA of the head. No focal etiology for the hemorrhage.  Acute Respiratory Failure  Intubated  Fentanyl x 3 during the night. Prn precedex  Avoid fentanyl if goal is sleep  CCM on board  History of aneurysm  As per husband, it was many years ago  Status post aneurysm clip  Clip not compatible with MRI due to no detailed information from husband  CTA head 6/19 - Stable size of left thalamic hemorrhage. Increased intraventricular blood. With slight increase in midline shift. Left frontal ventriculostomy catheter is in place. No significant residual or recurrent aneurysm. No significant proximal stenosis, aneurysm, or branch vessel occlusion within the Circle of Willis. Normal CTA of the head. No focal etiology for the hemorrhage.  Hypertensive emergency  Home BP meds: Norvasc ; Coreg ; Apresoline  Current BP meds: Norvasc ; Coreg   Treated with cleviprex, now off  Received 3 doses labetalol over  night . Increase SBP goal to < 160 mm Hg . Long-term BP goal normotensive  Hyperlipidemia  Home Lipid lowering medication: none   LDL 186, goal < 70  Current lipid lowering medication: none for now due to ICH   Consider statin at discharge  Diabetes type II, uncontrolled  Home diabetic meds: Jardiance ; Actos  Current diabetic meds: levemir  SSI  CBG monitoring  HgbA1c 7.7, goal < 7.0  Close PCP follow up  Dysphagia  Due to intubation the stroke  NPO  On tube feeding @ 20  On IV fluid @ 50  increase free water 100 q4h    Speech on board  Other Stroke Risk Factors  Advanced age  Obesity, s/p bariatric sugery, recommend weight loss, diet and exercise as appropriate   Family hx stroke (mother)   Hx of cardiomyopathy  History aneurysm s/p repair  Other  Active Problems  WBCs - 9.9->24K->17.9-16.4-14.9   (afebrile) (UA 6/18 neg) - not on abxs. CXR 6/20 - NAD  Hypokalemia 3.4 - replete and recheck   Hypernatremia 149   Hospital day # 4 Plan avoid further sedation.  Wean off ventilatory support as tolerated.  Discontinue fentanyl.  Start Lovenox for DVT prophylaxis.  No family available for discussion at the bedside.  Discussed with Dr.Icard critical care medicine.This patient is critically ill and at significant risk of neurological worsening, death and care requires constant monitoring of vital signs, hemodynamics,respiratory and cardiac monitoring, extensive review of multiple databases, frequent neurological assessment, discussion with family, other specialists and medical decision making of high complexity.I have made any additions or clarifications directly to the above note.This critical care time does not reflect procedure time, or teaching time or supervisory time of PA/NP/Med Resident etc but could involve care discussion time.  I spent 30 minutes of neurocritical care time  in the care of  this patient.    Antony Contras, MD  To contact Stroke  Continuity provider, please refer to [email or web address] After hours, contact General Neurology

## 2019-10-20 ENCOUNTER — Inpatient Hospital Stay (HOSPITAL_COMMUNITY): Payer: No Typology Code available for payment source

## 2019-10-20 DIAGNOSIS — J9601 Acute respiratory failure with hypoxia: Secondary | ICD-10-CM | POA: Diagnosis not present

## 2019-10-20 LAB — BASIC METABOLIC PANEL
Anion gap: 10 (ref 5–15)
BUN: 41 mg/dL — ABNORMAL HIGH (ref 6–20)
CO2: 24 mmol/L (ref 22–32)
Calcium: 9 mg/dL (ref 8.9–10.3)
Chloride: 116 mmol/L — ABNORMAL HIGH (ref 98–111)
Creatinine, Ser: 0.92 mg/dL (ref 0.44–1.00)
GFR calc Af Amer: >60 mL/min (ref >60)
GFR calc non Af Amer: >60 mL/min (ref >60)
Glucose, Bld: 146 mg/dL — ABNORMAL HIGH (ref 70–99)
Potassium: 3.8 mmol/L (ref 3.5–5.1)
Sodium: 150 mmol/L — ABNORMAL HIGH (ref 135–145)

## 2019-10-20 LAB — CBC
HCT: 39.2 % (ref 36.0–46.0)
Hemoglobin: 11.9 g/dL — ABNORMAL LOW (ref 12.0–15.0)
MCH: 26.4 pg (ref 26.0–34.0)
MCHC: 30.4 g/dL (ref 30.0–36.0)
MCV: 86.9 fL (ref 80.0–100.0)
Platelets: 216 10*3/uL (ref 150–400)
RBC: 4.51 MIL/uL (ref 3.87–5.11)
RDW: 14.7 % (ref 11.5–15.5)
WBC: 13.3 10*3/uL — ABNORMAL HIGH (ref 4.0–10.5)
nRBC: 0 % (ref 0.0–0.2)

## 2019-10-20 LAB — GLUCOSE, CAPILLARY
Glucose-Capillary: 117 mg/dL — ABNORMAL HIGH (ref 70–99)
Glucose-Capillary: 126 mg/dL — ABNORMAL HIGH (ref 70–99)
Glucose-Capillary: 143 mg/dL — ABNORMAL HIGH (ref 70–99)
Glucose-Capillary: 149 mg/dL — ABNORMAL HIGH (ref 70–99)
Glucose-Capillary: 215 mg/dL — ABNORMAL HIGH (ref 70–99)
Glucose-Capillary: 245 mg/dL — ABNORMAL HIGH (ref 70–99)

## 2019-10-20 LAB — MAGNESIUM: Magnesium: 2.5 mg/dL — ABNORMAL HIGH (ref 1.7–2.4)

## 2019-10-20 LAB — PHOSPHORUS: Phosphorus: 4.8 mg/dL — ABNORMAL HIGH (ref 2.5–4.6)

## 2019-10-20 NOTE — Progress Notes (Signed)
STROKE TEAM PROGRESS NOTE   INTERVAL HISTORY Patient remains sleepy but can be aroused.not weaning still needing ventilatory support. BP adequately controlled.Chest Xray no changes.Exam unchanged OBJECTIVE Vitals:   10/20/19 0700 10/20/19 0751 10/20/19 0800 10/20/19 0833  BP: (!) 137/96 (!) 137/96    Pulse: 91 87    Resp: 19 18    Temp:   99.3 F (37.4 C)   TempSrc:   Axillary   SpO2: 100% 100%  100%   CBC:  Recent Labs  Lab 10/15/19 1922 10/15/19 1923 10/19/19 0457 10/20/19 0451  WBC 9.9   < > 14.9* 13.3*  NEUTROABS 7.6  --   --   --   HGB 14.2   < > 13.0 11.9*  HCT 45.9   < > 41.9 39.2  MCV 85.2   < > 87.7 86.9  PLT 201   < > 198 216   < > = values in this interval not displayed.   Basic Metabolic Panel:  Recent Labs  Lab 10/19/19 0457 10/19/19 0457 10/19/19 2158 10/20/19 0451  NA 149*   < > 149* 150*  K 3.4*   < > 4.1 3.8  CL 115*   < > 116* 116*  CO2 23   < > 24 24  GLUCOSE 122*   < > 103* 146*  BUN 40*   < > 38* 41*  CREATININE 0.94   < > 0.81 0.92  CALCIUM 9.2   < > 9.2 9.0  MG 2.4   < > 2.4 2.5*  PHOS 4.1  --   --  4.8*   < > = values in this interval not displayed.    Lipid Panel:     Component Value Date/Time   CHOL 274 (H) 10/15/2019 1949   TRIG 287 (H) 10/18/2019 0539   HDL 64 10/15/2019 1949   CHOLHDL 4.3 10/15/2019 1949   VLDL 24 10/15/2019 1949   LDLCALC 186 (H) 10/15/2019 1949   HgbA1c:  Lab Results  Component Value Date   HGBA1C 9.1 (H) 10/15/2019   Urine Drug Screen:     Component Value Date/Time   LABOPIA NONE DETECTED 10/16/2019 2204   COCAINSCRNUR NONE DETECTED 10/16/2019 2204   LABBENZ POSITIVE (A) 10/16/2019 2204   AMPHETMU NONE DETECTED 10/16/2019 2204   THCU NONE DETECTED 10/16/2019 2204   LABBARB NONE DETECTED 10/16/2019 2204    Alcohol Level     Component Value Date/Time   ETH <10 10/15/2019 1922    IMAGING past 24h No results found.   PHYSICAL EXAM    General - Well nourished, well developed middle-aged  African-American lady, intubated not on sedation  Ophthalmologic - fundi not visualized due to noncooperation.  Cardiovascular - Regular rate and rhythm.  Neuro - intubated drowsy but can easily aroused when stimulated and does follow commands.  Oculocephalic reflexes are intact.  Pupils are reactive to light. Corneal reflex weak bilaterally, gag and cough present. Breathing over the vent.-But only slightly.  Facial symmetry not able to test due to ET tube.  Tongue protrusion not cooperative. Spontaneously moving left UE and LE and withdraw to pain with at least 3/5 strength, RUE 1/5 and RLE 3/5; DTR 1+ and no babinski. Sensation, coordination and gait not tested.    ASSESSMENT/PLAN Ms. Aniqa Hare is a 54 y.o. female with DM, HTN, previous hx of aneurysm repair and arthritis, presenting with acute onset of right sided weakness and depressed level of consciousness with garbled speech after complaining of a headache earlier  in the day.   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 Intraventricular penetration. Early dilatation of the lateral ventricles.   CT head - No significant interval change in size and morphology of acute intraparenchymal hemorrhage emanating from the left thalamus, estimated volume 11 CC. 5 mm MLS. IVH similar to previous. Associated obstructive hydrocephalus appears slightly worsened from previous.   MRI head - not able to perform due to previous aneurysm clips  CTA Head 6/19 -  Stable size of left thalamic hemorrhage. Increased intraventricular blood. With slight increase in midline shift. Left frontal ventriculostomy catheter is in place. No significant residual or recurrent aneurysm. No significant proximal stenosis, aneurysm, or branch vessel occlusion within the Circle of Willis. Normal CTA of the head. No focal etiology for the hemorrhage.  Carotid Doppler - unremarkable  2D Echo - EF  60 - 65%. No cardiac source of emboli identified.  Sars Corona Virus 2  - negative  LDL - 186  HgbA1c - 7.7  UDS - benzodiazepine  VTE prophylaxis - Lovenox 40 mg sq daily   No antithrombotic prior to admission, now on No antithrombotic  Therapy recommendations:  CIR   Disposition:  Pending  Obstructive hydrocephalus s/p EVD  CT repeat showed worsening obstructive hydrocephalus  Neurosurgery on board  Status post EVD [name]  Repeat CTA 6/19 - Stable size of left thalamic hemorrhage. Increased intraventricular blood. With slight increase in midline shift. Left frontal ventriculostomy catheter is in place. No significant residual or recurrent aneurysm. No significant proximal stenosis, aneurysm, or branch vessel occlusion within the Circle of Willis. Normal CTA of the head. No focal etiology for the hemorrhage.  Acute Respiratory Failure  Intubated  Prn precedex, fentanyl  Failed weaning this am  CCM on board  History of aneurysm  As per husband, it was many years ago  Status post aneurysm clip  Clip not compatible with MRI due to no detailed information from husband  CTA head 6/19 - Stable size of left thalamic hemorrhage. Increased intraventricular blood. With slight increase in midline shift. Left frontal ventriculostomy catheter is in place. No significant residual or recurrent aneurysm. No significant proximal stenosis, aneurysm, or branch vessel occlusion within the Circle of Willis. Normal CTA of the head. No focal etiology for the hemorrhage.  Hypertensive emergency  Home BP meds: Norvasc ; Coreg ; Apresoline  Current BP meds: Norvasc ; Coreg   Treated with cleviprex, now off  Received 3 doses labetalol over night . Increase SBP goal to < 160 mm Hg . Long-term BP goal normotensive  Hyperlipidemia  Home Lipid lowering medication: none   LDL 186, goal < 70  Current lipid lowering medication: none for now due to ICH   Consider statin at  discharge  Diabetes type II, uncontrolled  Home diabetic meds: Jardiance ; Actos  Current diabetic meds: levemir  SSI  CBG monitoring  HgbA1c 7.7, goal < 7.0  Close PCP follow up  Dysphagia  Due to intubation the stroke  NPO  On tube feeding @ 20  On IV fluid @ 50  increase free water 100 q4h    Speech on board  Other Stroke Risk Factors  Advanced age  Obesity, s/p bariatric sugery, recommend weight loss, diet and exercise as appropriate   Family hx stroke (mother)   Hx of cardiomyopathy  History aneurysm s/p repair  obstructive sleep apnea per pt report  Other Active  Problems  WBCs - 9.9->24K->17.9-16.4-14.9-13.3   (afebrile) (UA 6/18 neg) - not on abxs. CXR 6/20 - NAD  Hypokalemia 3.8    Hypernatremia 150  Lethargic. Unable to get amantadine/ritalin d/t hx QT prolongation   Hospital day # 5 Continue weaning of ventilatory support  For resp failure and wean and extubate as tolerated.failed SBT trials so far. D/w Dr Valeta Harms. Continue strict Bp control with SBP goal below 160.  This patient is critically ill and at significant risk of neurological worsening, death and care requires constant monitoring of vital signs, hemodynamics,respiratory and cardiac monitoring, extensive review of multiple databases, frequent neurological assessment, discussion with family, other specialists and medical decision making of high complexity.I have made any additions or clarifications directly to the above note.This critical care time does not reflect procedure time, or teaching time or supervisory time of PA/NP/Med Resident etc but could involve care discussion time.  I spent 30 minutes of neurocritical care time  in the care of  this patient.    Antony Contras, MD  To contact Stroke Continuity provider, please refer to [email or web address] After hours, contact General Neurology

## 2019-10-20 NOTE — Progress Notes (Signed)
Physical Therapy Treatment Patient Details Name: Latoya Cox MRN: [id number] DOB: [birth year] Today's Date: 10/20/2019    History of Present Illness 54 y.o. female with DM, HTN and arthritis, [name] and depressed level of consciousness with garbled speech at home, after complaining of a headache earlier in the day. STAT CT head was obtained, revealing an ICH originating from the left basal ganglila, extending into the left lateral, third and 4th ventricles, with a small amount of blood in the right lateral ventricle as well. Early stages of hydrocephalus were also noted on CT. Pt underwent L frontal IVC placement and intubation on 6/18.    PT Comments    Patient with limited progress due to more lethargy today.  Able to sit EOB with support but pushing to R unless cued and L hand placed in her lap.  Weaning on PS/CPAP today.  Hopeful for OOB if able to be extubated next session.  PT to continue to follow acutely.    Follow Up Recommendations  CIR     Equipment Recommendations  Hospital bed;Wheelchair (measurements PT);Wheelchair cushion (measurements PT)    Recommendations for Other Services       Precautions / Restrictions Precautions Precautions: Fall;Other (comment) Precaution Comments: EVD, ETT    Mobility  Bed Mobility Overal bed mobility: Needs Assistance Bed Mobility: Rolling;Sidelying to Sit Rolling: Mod assist;+2 for physical assistance Sidelying to sit: +2 for physical assistance;Total assist Supine to sit: Max assist;+2 for physical assistance     General bed mobility comments: assisted to roll after knees flexed, pt still able to bridge hips when returning to supine to scoot over even when not cued, needed max/total A to lift trunk  Transfers                 General transfer comment: NT today due to bed pad not under hips to allow to assist  Ambulation/Gait                 Stairs              Wheelchair Mobility    Modified Rankin (Stroke Patients Only) Modified Rankin (Stroke Patients Only) Pre-Morbid Rankin Score: No symptoms Modified Rankin: Severe disability     Balance Overall balance assessment: Needs assistance Sitting-balance support: Feet supported Sitting balance-Leahy Scale: Poor Sitting balance - Comments: pushing to R and leaning posterior; sat EOB about 8 minutes Postural control: Right lateral lean;Posterior lean Standing balance support: Bilateral upper extremity supported Standing balance-Leahy Scale: Zero                              Cognition Arousal/Alertness: Lethargic Behavior During Therapy: Flat affect Overall Cognitive Status: Difficult to assess Area of Impairment: Safety/judgement;Following commands;Awareness;Problem solving;Orientation;Attention                   Current Attention Level: Focused   Following Commands: Follows one step commands with increased time;Follows one step commands inconsistently Safety/Judgement: Decreased awareness of deficits;Decreased awareness of safety   Problem Solving: Slow processing;Requires verbal cues General Comments: opens eyes briefly when asked, pushing and needs frequent reminders not to push to R      Exercises      General Comments General comments (skin integrity, edema, etc.): more lethargic than last session, but participating some      Pertinent Vitals/Pain Pain Assessment: Faces Faces Pain Scale: Hurts little  more Pain Location: generalized Pain Descriptors / Indicators: Aching;Grimacing Pain Intervention(s): Monitored during session;Limited activity within patient's tolerance;Repositioned    Home Living                      Prior Function            PT Goals (current goals can now be found in the care plan section) Progress towards PT goals: Not progressing toward goals - comment    Frequency    Min 4X/week      PT  Plan Current plan remains appropriate    Co-evaluation              AM-PAC PT "6 Clicks" Mobility   Outcome Measure  Help needed turning from your back to your side while in a flat bed without using bedrails?: A Lot Help needed moving from lying on your back to sitting on the side of a flat bed without using bedrails?: Total Help needed moving to and from a bed to a chair (including a wheelchair)?: Total Help needed standing up from a chair using your arms (e.g., wheelchair or bedside chair)?: Total Help needed to walk in hospital room?: Total Help needed climbing 3-5 steps with a railing? : Total 6 Click Score: 7    End of Session Equipment Utilized During Treatment: Oxygen (vent) Activity Tolerance: Patient limited by fatigue Patient left: in bed;with call bell/phone within reach   PT Visit Diagnosis: Other abnormalities of gait and mobility (R26.89);Muscle weakness (generalized) (M62.81);Other symptoms and signs involving the nervous system (R29.898)     Time: 7209-4709 PT Time Calculation (min) (ACUTE ONLY): 22 min  Charges:  $Therapeutic Activity: 8-22 mins                     Sheran Lawless, PT Acute Rehabilitation Services Pager:[phone] Office:[phone] 10/20/2019    Latoya Cox 10/20/2019, 3:12 PM

## 2019-10-20 NOTE — Progress Notes (Signed)
Subjective: Patient reports on full support ventilation.   Objective: Vital signs in last 24 hours: Temp:  [98.2 F (36.8 C)-100.7 F (38.2 C)] 99.3 F (37.4 C) (06/22 0800) Pulse Rate:  [52-105] 87 (06/22 0751) Resp:  [11-24] 18 (06/22 0751) BP: (101-160)/(70-96) 137/96 (06/22 0751) SpO2:  [99 %-100 %] 100 % (06/22 0751) FiO2 (%):  [40 %] 40 % (06/22 0751)  Intake/Output from previous day: 06/21 0701 - 06/22 0700 In: 890 [NG/GT:890] Out: 1193 [Urine:1100; Drains:93] Intake/Output this shift: No intake/output data recorded.  Physical Exam: Opens eyes spontanousley. Nods appropriately. Follows commands in BUE and BLE. Pupils 3 brisk PERRL. MAE. LUE and RUE with movement against gravity. RUE weaker than LUE. RUE with drift. Poor dexterity/hand intrinsics right. BLE with movement with gravity eliminated. Ventric dressing CDI. Ventric pulsatile and set at 10 Cm H20 with serosanguinous drainage. Bilateral hand mittens for patient safety. Receiving parenteral nutrition.   Lab Results: Recent Labs    10/19/19 0457 10/20/19 0451  WBC 14.9* 13.3*  HGB 13.0 11.9*  HCT 41.9 39.2  PLT 198 216   BMET Recent Labs    10/19/19 2158 10/20/19 0451  NA 149* 150*  K 4.1 3.8  CL 116* 116*  CO2 24 24  GLUCOSE 103* 146*  BUN 38* 41*  CREATININE 0.81 0.92  CALCIUM 9.2 9.0    Studies/Results: No results found.  Assessment/Plan: Continue support. Wean ventilator as appropriate. Set ventric at 15 Cm H20 today if patients neuro exam is stable. If patient continues to improve, will set ventric at 20 CM H20 on Wednesday and plan to cap on Thursday. Will need rehab. Continue working with therapies.     LOS: 5 days    Dorian Heckle, MD 10/20/2019, 8:16 AM

## 2019-10-20 NOTE — Progress Notes (Addendum)
NAME:  Latoya Cox, MRN:  [id number], DOB:  06/28/1965, LOS: 5 ADMISSION DATE:  10/15/2019, CONSULTATION DATE:  10/16/19 REFERRING MD:  Cheral Marker, CHIEF COMPLAINT:  Acute encephalopathy  Brief History   54yF with acute encephalopathy from thalamic hemorrhage with intraventricular extension, worsening hydrocephalus requiring EVD and intubation this morning.   History of present illness   54yF with DM, HTN, arthritis who presented to ED 6/17 for R sided weakness and lethargy, change in speech after a headache earlier on day of admission. Much of history if obtained through chart review as she is unable to participate in interview at time of my exam due to her encephaloapthy. On arrival to EMS R facial droop also noted, some nausea and emesis. CTH showed ICH from left basal ganglia with extension into left lateral, third and 4th ventricles, with a small amount of blood in the right lateral ventricle as well, some early hydrocephalus.   On arrival to ICU she was started on cleviprex for SBP 100-140. Her exam deteriorated however and CTH showed worsening hydrocephalus. She has also had intermittent tachycardia which appears to be sinus on the EKGs available for review, including one with HR 146.  Past Medical History  DM HTN Arthritis  Significant Hospital Events   6/18 intubation 6/18 EVD placement  Consults:  PCCM Neurosurgery  Procedures:  6/18 intubation 6/18 EVD placement  Significant Diagnostic Tests:  [facility] 6/18 0150: obstructive hydrocephalus appears slightly worsened. 51mm left to right midline shift at septum pellucidum. No chagne in IPH size CTA Head 6/19: 1. Stable size of left thalamic hemorrhage. 2. Increased intraventricular blood. With slight increase in midline shift 3. Left frontal ventriculostomy catheter is in place. 4. No significant residual or recurrent aneurysm. 5. No significant proximal stenosis, aneurysm, or branch vessel occlusion within the Circle of Willis.  Normal CTA of the head. No focal etiology for the hemorrhage.  Micro Data:  MRSA PCR 6/18 > Negative   Antimicrobials:  None  Interim history/subjective:  No acute change overnight.  Off sedation.  Following commands but failed SBT r/t apnea.   Objective   Blood pressure (!) 145/90, pulse 87, temperature 99.3 F (37.4 C), temperature source Axillary, resp. rate (!) 21, SpO2 100 %.    Vent Mode: PRVC FiO2 (%):  [40 %] 40 % Set Rate:  [18 bmp] 18 bmp Vt Set:  [400 mL] 400 mL PEEP:  [5 cmH20] 5 cmH20 Pressure Support:  [15 cmH20] 15 cmH20 Plateau Pressure:  [12 cmH20-14 cmH20] 14 cmH20   Intake/Output Summary (Last 24 hours) at 10/20/2019 0347 Last data filed at 10/20/2019 0900 Gross per 24 hour  Intake 970 ml  Output 1184 ml  Net -214 ml   There were no vitals filed for this visit.  Examination: General: wdwn female, NAD in bed   HENT: EVD in place >> Clean, Dry, ETT/OG in place   Lungs: respsClear breath sounds, no wheeze/crackles, vent assisted breaths   Cardiovascular: RRR, no MRG Abdomen: soft, nontender, active bowel sounds  Extremities: -edema  Neuro: drowsy but opens eyes to name, mouths "hi", follows simple commands but falls back to sleep easily  Resolved Hospital Problem list   n/a  Assessment & Plan:   Acute hypoxic respiratory failure: intubated for airway protection Plan - Vent Support > Mentation barrier to extubation although improving slowly.  Still failed SBT this am r/t apnea despite improving mental status, following commands.  May take a bit more time for residual sedation to wear off.  -  VAP Bundle  - Trend CXR  - avoid sedation as able  - continue attempts at PS wean - placed back on PS during my exam   ICH with IVH with worsening hydrocephalus:s/p EVD in setting of HTN Repeat CTA Head with stable Left Thalamic Hemorrage and increase size in IVH with increase of midline shift  H/O Aneurysm s/p Clip  Plan - Per Neurology and Neurosurgery,    - Continue Norvasc, Coreg, hydralazine  - Systolic Goal <140. PRN Labetalol   Hypokalemia  Hypernatremia  Plan  -Trend BMP - continue free water  -replete K prn    DM Plan -continue Levemir, SSI, and TF Coverage   Remaining Management per Primary Team  Best practice:  Diet: TF Pain/Anxiety/Delirium protocol (if indicated): yes  VAP protocol (if indicated): yes DVT prophylaxis: holding, SCDs GI prophylaxis: PPI  Glucose control: SSI Mobility: bed level Code Status: Full Family Communication: no family at bedside 6/22 Disposition: ICU  Labs   CBC: Recent Labs  Lab 10/15/19 1922 10/15/19 1923 10/16/19 0548 10/17/19 0623 10/18/19 0539 10/19/19 0457 10/20/19 0451  WBC 9.9   < > 24.0* 17.9* 16.4* 14.9* 13.3*  NEUTROABS 7.6  --   --   --   --   --   --   HGB 14.2   < > 16.0* 13.6 14.0 13.0 11.9*  HCT 45.9   < > 50.8* 43.5 44.3 41.9 39.2  MCV 85.2   < > 84.5 85.6 85.0 87.7 86.9  PLT 201   < > 217 187 169 198 216   < > = values in this interval not displayed.    Basic Metabolic Panel: Recent Labs  Lab 10/16/19 0548 10/16/19 1653 10/17/19 0623 10/18/19 0539 10/19/19 0457 10/19/19 2158 10/20/19 0451  NA   < >  --  142 146* 149* 149* 150*  K   < >  --  3.0* 3.4* 3.4* 4.1 3.8  CL   < >  --  105 111 115* 116* 116*  CO2   < >  --  24 24 23 24 24   GLUCOSE   < >  --  95 120* 122* 103* 146*  BUN   < >  --  33* 41* 40* 38* 41*  CREATININE   < >  --  2.13* 1.02* 0.94 0.81 0.92  CALCIUM   < >  --  9.0 8.8* 9.2 9.2 9.0  MG   < > 2.1 2.2 2.2 2.4 2.4 2.5*  PHOS  --  4.5 4.4 3.8 4.1  --  4.8*   < > = values in this interval not displayed.   GFR: CrCl cannot be calculated (Unknown ideal weight.). Recent Labs  Lab 10/17/19 0623 10/18/19 0539 10/19/19 0457 10/20/19 0451  WBC 17.9* 16.4* 14.9* 13.3*    Liver Function Tests: Recent Labs  Lab 10/15/19 1922  AST 19  ALT 17  ALKPHOS 78  BILITOT 1.5*  PROT 7.5  ALBUMIN 4.3   No results for input(s):  LIPASE, AMYLASE in the last 168 hours. No results for input(s): AMMONIA in the last 168 hours.  ABG    Component Value Date/Time   PHART 7.486 (H) 10/15/2019 1956   PCO2ART 35.7 10/15/2019 1956   PO2ART 72 (L) 10/15/2019 1956   HCO3 27.0 10/15/2019 1956   TCO2 28 10/15/2019 1956   O2SAT 95.0 10/15/2019 1956     Coagulation Profile: Recent Labs  Lab 10/15/19 1922  INR 1.0    Cardiac Enzymes: No  results for input(s): CKTOTAL, CKMB, CKMBINDEX, TROPONINI in the last 168 hours.  HbA1C: Hgb A1c MFr Bld  Date/Time Value Ref Range Status  10/15/2019 07:49 PM 9.1 (H) 4.8 - 5.6 % Final    Comment:    (NOTE)         Prediabetes: 5.7 - 6.4         Diabetes: >6.4         Glycemic control for adults with diabetes: <7.0   01/14/2019 06:43 PM 7.7 (H) 4.8 - 5.6 % Final    Comment:    (NOTE)         Prediabetes: 5.7 - 6.4         Diabetes: >6.4         Glycemic control for adults with diabetes: <7.0     CBG: Recent Labs  Lab 10/19/19 0758 10/19/19 1132 10/19/19 1539 10/20/19 0336 10/20/19 0748  GLUCAP 94 208* 154* 149* 126*     This patient is critically ill with multiple organ system failure; which, requires frequent high complexity decision making, assessment, support, evaluation, and titration of therapies. This was completed through the application of advanced monitoring technologies and extensive interpretation of multiple databases. During this encounter critical care time was devoted to patient care services described in this note for 32 minutes.  Dirk Dress, NP Pulmonary/Critical Care Medicine  10/20/2019  9:28 AM   PCCM:  54 yo thalamic ICH. hydroceph with EVD. Mental status much better. Still with very low MVe nin pressure support. Discuss with neurology and they are thinking about starting a stimulant. I do believe its better today. She occasionally goes apenic which makes me worry about extubation.   BP 127/84   Pulse 84   Temp 99.3 F (37.4 C)  (Axillary)   Resp 13   SpO2 100%   Gen: middle aged Fm, intubated on MV  HENT: EVD in place, alert to voice, tracking  Heart: RRR, s1 s2 Lungs: BL mech vented breaths  Abd; soft   Labs reivwed  Vent reviewed: she looks ok in ps right now   A:  Thalamic ICH with Hydrocephalus s/p EVD  AHRF on vent  Low minute ventilation in PS, occasional apenic spells   P: She seems to be improving from a respiratory stand point  Please AVOID opiates  SBT and SAT in AM  Would like to give trial of extubation  Continue amlodipine, carvedilol and hydralazine  This patient is critically ill with multiple organ system failure; which, requires frequent high complexity decision making, assessment, support, evaluation, and titration of therapies. This was completed through the application of advanced monitoring technologies and extensive interpretation of multiple databases. During this encounter critical care time was devoted to patient care services described in this note for 32 minutes.  Josephine Igo, DO [facility] 10/20/2019 3:27 PM

## 2019-10-20 NOTE — Progress Notes (Signed)
RT advanced ETT 3 cm per MD verbal order. ETT was at 19cm at the lips, ETT is now 22cm at the lips. MD ordered CXR. RT will continue to monitor.

## 2019-10-21 DIAGNOSIS — I610 Nontraumatic intracerebral hemorrhage in hemisphere, subcortical: Secondary | ICD-10-CM | POA: Diagnosis not present

## 2019-10-21 DIAGNOSIS — J9601 Acute respiratory failure with hypoxia: Secondary | ICD-10-CM | POA: Diagnosis not present

## 2019-10-21 LAB — GLUCOSE, CAPILLARY
Glucose-Capillary: 144 mg/dL — ABNORMAL HIGH (ref 70–99)
Glucose-Capillary: 113 mg/dL — ABNORMAL HIGH (ref 70–99)
Glucose-Capillary: 155 mg/dL — ABNORMAL HIGH (ref 70–99)
Glucose-Capillary: 110 mg/dL — ABNORMAL HIGH (ref 70–99)
Glucose-Capillary: 172 mg/dL — ABNORMAL HIGH (ref 70–99)
Glucose-Capillary: 90 mg/dL (ref 70–99)
Glucose-Capillary: 104 mg/dL — ABNORMAL HIGH (ref 70–99)

## 2019-10-21 LAB — PHOSPHORUS: Phosphorus: 4 mg/dL (ref 2.5–4.6)

## 2019-10-21 LAB — MAGNESIUM: Magnesium: 2.5 mg/dL — ABNORMAL HIGH (ref 1.7–2.4)

## 2019-10-21 MED ORDER — ORAL CARE MOUTH RINSE
15.0000 mL | Freq: Two times a day (BID) | OROMUCOSAL | Status: DC
  Administered 2019-10-22 – 2019-11-22 (×47): 15 mL via OROMUCOSAL

## 2019-10-21 MED ORDER — CHLORHEXIDINE GLUCONATE CLOTH 2 % EX PADS
6.0000 | MEDICATED_PAD | Freq: Every day | CUTANEOUS | Status: DC
  Administered 2019-10-22 – 2019-11-02 (×8): 6 via TOPICAL

## 2019-10-21 MED ORDER — HYDRALAZINE HCL 20 MG/ML IJ SOLN
5.0000 mg | Freq: Three times a day (TID) | INTRAMUSCULAR | Status: DC
  Administered 2019-10-21 – 2019-10-23 (×7): 5 mg via INTRAVENOUS
  Filled 2019-10-21 (×7): qty 1

## 2019-10-21 MED ORDER — CHLORHEXIDINE GLUCONATE 0.12 % MT SOLN
15.0000 mL | Freq: Two times a day (BID) | OROMUCOSAL | Status: DC
  Administered 2019-10-21 – 2019-11-23 (×63): 15 mL via OROMUCOSAL
  Filled 2019-10-21 (×63): qty 15

## 2019-10-21 NOTE — Progress Notes (Signed)
STROKE TEAM PROGRESS NOTE   INTERVAL HISTORY Pt reclining in bed, eyes open, more awake alert and following commands. However, still anomia and with intermittent word salad, able to repeat. Pending CIR admission.    OBJECTIVE Vitals:   10/27/19 0338 10/27/19 0400 10/27/19 0500 10/27/19 0835  BP: (!) 178/110   (!) 153/106  Pulse: 87   81  Resp: 17 13 13 13   Temp: 98.8 F (37.1 C)   98.9 F (37.2 C)  TempSrc: Oral   Oral  SpO2: 98%   98%  Weight:      Height:       CBC:  Recent Labs  Lab 10/26/19 0302 10/27/19 0307  WBC 14.5* 15.9*  HGB 12.6 13.0  HCT 41.3 41.9  MCV 87.5 86.9  PLT 275 010   Basic Metabolic Panel:  Recent Labs  Lab 10/21/19 0609 10/22/19 2121 10/24/19 0337 10/26/19 0302 10/27/19 0307  NA  --  153*   < > 150* 138  K  --  3.4*   < > 3.7 5.7*  CL  --  117*   < > 118* 109  CO2  --  22   < > 22 19*  GLUCOSE  --  166*   < > 194* 167*  BUN  --  51*   < > 28* 18  CREATININE  --  1.15*   < > 0.96 0.69  CALCIUM  --  9.3   < > 8.4* 8.2*  MG 2.5* 2.9*  --   --   --   PHOS 4.0  --   --   --   --    < > = values in this interval not displayed.   Lipid Panel:     Component Value Date/Time   CHOL 274 (H) 10/15/2019 1949   TRIG 287 (H) 10/18/2019 0539   HDL 64 10/15/2019 1949   CHOLHDL 4.3 10/15/2019 1949   VLDL 24 10/15/2019 1949   LDLCALC 186 (H) 10/15/2019 1949   HgbA1c:  Lab Results  Component Value Date   HGBA1C 9.1 (H) 10/15/2019   Urine Drug Screen:     Component Value Date/Time   LABOPIA NONE DETECTED 10/16/2019 2204   COCAINSCRNUR NONE DETECTED 10/16/2019 2204   LABBENZ POSITIVE (A) 10/16/2019 2204   AMPHETMU NONE DETECTED 10/16/2019 2204   THCU NONE DETECTED 10/16/2019 2204   LABBARB NONE DETECTED 10/16/2019 2204    Alcohol Level     Component Value Date/Time   ETH <10 10/15/2019 1922    IMAGING past 24h No results found.   PHYSICAL EXAM  General - Well nourished, well developed middle-aged African-American lady, not in  distress  Ophthalmologic - fundi not visualized due to noncooperation.  Cardiovascular - Regular rate and rhythm.  Neuro - awake alert, able to tell me her name, her husband name, relation with husband. Able to follow some simple commands, however, intermittent word salad, still has anomia, but able to repeat sentences.  Pupils are equal reactive to light. Blinking to visual threat bilaterally. No gaze palsy. Right lower facial weakness. Tongue protrusion midline. Left UE 4/5, LLE 3/5, RLE 2/5 proximal and 3/5 distal, RUE 3+/5. DTR 1+ and no babinski. Sensation subjectively symmetrical, coordination grossly intact and gait not tested.    ASSESSMENT/PLAN [name] is a 54 y.o. female with DM, HTN, previous hx of aneurysm repair and arthritis, presenting with acute onset of right sided weakness and depressed level of consciousness with garbled speech after complaining of a  headache earlier in the day.   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 Intraventricular penetration. Early dilatation of the lateral ventricles.   CT head - No significant interval change in size and morphology of acute intraparenchymal hemorrhage emanating from the left thalamus, estimated volume 11 CC. 5 mm MLS. IVH similar to previous. Associated obstructive hydrocephalus appears slightly worsened from previous.   MRI head - not able to perform due to previous aneurysm clips  CTA Head 6/19 - No significant residual or recurrent aneurysm.   CT Head 6/24 - Interval improvement in left BGa hematoma. Resolution of IVH.No hydrocephalus.  Carotid Doppler - unremarkable  2D Echo - EF 60 - 65%. No cardiac source of emboli identified.  Sars Corona Virus 2  - negative  LDL - 186  HgbA1c - 7.7  UDS - benzodiazepine  VTE prophylaxis - Lovenox 40 mg sq daily   No antithrombotic prior to admission, now on No antithrombotic  Therapy  recommendations:  CIR   Disposition:  Pending  Obstructive hydrocephalus s/p EVD  CT repeat showed worsening obstructive hydrocephalus  Neurosurgery on board  Status post EVD Latoya Cox)  CT Head 6/24 - Interval improvement in left BGa hematoma. Resolution of IVH.No hydrocephalus.   EVD self removed 6/24  Acute Respiratory Failure  Intubated  Self extubation 6/24 - tolerating well  Hypernatremia and AKI  Na 155->154->150->148->138  Creatinine 0.92-1.15-0.98-1.30->1.12->0.96->0.69   On 1/2 NS @ 75 ->100->50  Encourage po intake  Continue monitor BMP  Leukocytosis  WBCs - 9.9->24K->17.9-16.4-14.9-13.3->15.5->14.5->15.9 (afebrile)  UA neg 6/26  CXR 6/26 bibasilar atelectasis  CBC monitoring  History of aneurysm  As per husband, it was many years ago  Status post aneurysm clip  Clip not compatible with MRI due to no detailed information from husband  CTA head 6/19 - No significant residual or recurrent aneurysm.   Hypertensive emergency  Home BP meds: Norvasc ; Coreg ; Apresoline  Treated with cleviprex, now off  BP on the high end  Continue norvasc 10, coreg 25 bid  hydralazine 25 q8->50 q8->100 q8  New catapress 0.1 bid  . Long-term BP goal normotensive  Hyperlipidemia  Home Lipid lowering medication: none   LDL 186, goal < 70  Now on lipitor 40  Consider statin at discharge  Diabetes type II, uncontrolled  Home diabetic meds: Jardiance ; Actos  Current diabetic meds: levemir  SSI  CBG monitoring  HgbA1c 7.7, goal < 7.0  Close PCP follow up  Dysphagia  Due to intubation the stroke  NPO->tube feeding -> dysphagia 2 nectar thick liquids  On IV fluid 1/2 NS @ 100->50  Speech on board  Other Stroke Risk Factors  Advanced age  Obesity, s/p bariatric sugery, recommend weight loss, diet and exercise as appropriate   Family hx stroke (mother)   Hx of cardiomyopathy  History aneurysm s/p repair  obstructive sleep  apnea per pt report  Other Active Problems  Hypokalemia 3.8->3.4 ->5.6->4.6->3.3->4.0->3.7 - resolved. Hyperkalemia ->5.7 - kayexalate    Lethargic and put on amantadine (QTc 411 on 6/24)  Paroxysmal SVT - on home coreg 25 bid  Hospital day # 12  Marvel Plan, MD PhD Stroke Neurology 10/27/2019 5:14 PM   To contact Stroke Continuity provider, please refer to [email or web address]. After hours, contact General Neurology

## 2019-10-21 NOTE — Evaluation (Signed)
Clinical/Bedside Swallow Evaluation Patient Details  Name: Latoya Cox MRN: [id number] Date of Birth: Jan 30, 1966  Today's Date: 10/21/2019 Time: SLP Start Time (ACUTE ONLY): 1300 SLP Stop Time (ACUTE ONLY): 1318 SLP Time Calculation (min) (ACUTE ONLY): 18 min  Past Medical History:  Past Medical History:  Diagnosis Date  . Arthritis   . Chest wall pain   . Diabetes mellitus without complication (HCC)   . Hypertension    Past Surgical History:  Past Surgical History:  Procedure Laterality Date  . aneurism repair    . lapband     HPI:  Pt with acute headache and slurred speech, brought by EMS to hospital. CT head was obtained, revealing an ICH originating from the left basal ganglila, extending into the left lateral, third and 4th ventricles, with a small amount of blood in the right lateral ventricle as well. Early stages of hydrocephalus were also noted on CT. Acute hypoxia from respiratory failure, requiring intubation on 10/16/19. Extubated 10/21/19. Now on [place] at 2L/min.   Assessment / Plan / Recommendation Clinical Impression  Latoya Cox demonstrates a moderate oral and suspected pharyngeal dysphagia s/p recent extubation (3 hours ago) after being intubated 5 days for respiratory failure following large ICH. Pt was lethargic t/o evaluation, but followed directions intermittently. She nodded in agreement with evaluation and with education. Oral care was provided with suction toothbrush--dried secretions noted in oral cavity. Pt with wet and weak cough prior to any trials, but not productive. Given ice chip via tsp, pt had good oral phase (took ice chip from spoon and manipulated without anterior spillage). Delayed cough was noted. Given second ice chip, pt again had delayed wet cough. Given thin water in half tsp, pt had stronger and immediate cough response. Orally, she was noted with anterior spillage of this bolus trial with noted difficulty taking bolus off spoon. She was unable to  cough anything into oral cavity to be suctioned. Based on significant lethargy and s/s aspiration s/p this recent extubation, recommend pt continue NPO pending improvement in alertness. ST service to follow closely to initiate diet once appropriate. Pt may benefit from FEES prior to initiating diet if clinically indicated.     SLP Visit Diagnosis: Dysphagia, unspecified (R13.10)    Aspiration Risk  Severe aspiration risk    Diet Recommendation NPO;Alternative means - temporary;Ice chips PRN after oral care   Medication Administration: Via alternative means Postural Changes: Seated upright at 90 degrees    Other  Recommendations Oral Care Recommendations: Oral care QID;Oral care prior to ice chip/H20   Follow up Recommendations Skilled Nursing facility      Frequency and Duration min 2x/week  2 weeks       Prognosis Prognosis for Safe Diet Advancement: Good Barriers to Reach Goals: Severity of deficits      Swallow Study   General Date of Onset: 10/16/19 HPI: Pt with acute headache and slurred speech, brought by EMS to hospital. CT head was obtained, revealing an ICH originating from the left basal ganglila, extending into the left lateral, third and 4th ventricles, with a small amount of blood in the right lateral ventricle as well. Early stages of hydrocephalus were also noted on CT. Acute hypoxia from respiratory failure, requiring intubation on 10/16/19. Extubated 10/21/19. Now on [place] at 2L/min. Type of Study: Bedside Swallow Evaluation Previous Swallow Assessment: n/a Diet Prior to this Study: NPO Temperature Spikes Noted: Yes Respiratory Status: Nasal cannula History of Recent Intubation: Yes Length of Intubations (days): 5 days Date  extubated: 10/21/19 Behavior/Cognition: Lethargic/Drowsy;Cooperative Oral Cavity Assessment: Dried secretions Oral Care Completed by SLP: Yes Oral Cavity - Dentition: Adequate natural dentition Self-Feeding Abilities: Total assist Patient  Positioning: Upright in bed Baseline Vocal Quality: Aphonic Volitional Cough: Cognitively unable to elicit Volitional Swallow: Able to elicit    Oral/Motor/Sensory Function Overall Oral Motor/Sensory Function: Other (comment) (could not fully assess d/t cognition)   Ice Chips Ice chips: Impaired Presentation: Spoon Pharyngeal Phase Impairments: Throat Clearing - Delayed;Cough - Delayed   Thin Liquid Thin Liquid: Impaired Presentation: Spoon Oral Phase Impairments: Reduced labial seal Pharyngeal  Phase Impairments: Cough - Immediate;Cough - Delayed    Nectar Thick   Not trialed  Honey Thick   Not trialed  Puree   Not trialed  Solid    Sabrie Moritz P. Marciel Offenberger, M.S., [place] Pager: [phone]    Not trialed     [place] 10/21/2019,1:21 PM

## 2019-10-21 NOTE — Progress Notes (Signed)
Inpatient Rehab Admissions Coordinator:   Note pt extubated.  Will follow for progress before requesting CIR order.   Estill Dooms, PT, DPT Admissions Coordinator [phone] 10/21/19  4:56 PM

## 2019-10-21 NOTE — Progress Notes (Addendum)
NAME:  Latoya Cox, MRN:  [id number], DOB:  Feb 03, 1966, LOS: 6 ADMISSION DATE:  10/15/2019, CONSULTATION DATE:  10/16/19 REFERRING MD:  Cheral Marker, CHIEF COMPLAINT:  Acute encephalopathy  Brief History   54yF with acute encephalopathy from thalamic hemorrhage with intraventricular extension, worsening hydrocephalus requiring EVD and intubation 6/18.   History of present illness   54yF with DM, HTN, arthritis who presented to ED 6/17 for R sided weakness and lethargy, change in speech after a headache earlier on day of admission. Much of history if obtained through chart review as she is unable to participate in interview at time of my exam due to her encephaloapthy. On arrival to EMS R facial droop also noted, some nausea and emesis. CTH showed ICH from left basal ganglia with extension into left lateral, third and 4th ventricles, with a small amount of blood in the right lateral ventricle as well, some early hydrocephalus.   On arrival to ICU she was started on cleviprex for SBP 100-140. Her exam deteriorated however and CTH showed worsening hydrocephalus. She has also had intermittent tachycardia which appears to be sinus on the EKGs available for review, including one with HR 146.  Past Medical History  DM HTN Arthritis  Significant Hospital Events   6/18 intubation 6/18 EVD placement  Consults:  PCCM Neurosurgery  Procedures:  6/18 intubation 6/18 EVD placement  Significant Diagnostic Tests:  [facility] 6/18 0150 > Obstructive hydrocephalus appears slightly worsened. 29mm left to right midline shift at septum pellucidum. No chagne in IPH size  CTA Head 6/19 > 1. Stable size of left thalamic hemorrhage. 2. Increased intraventricular blood. With slight increase in midline shift 3. Left frontal ventriculostomy catheter is in place. 4. No significant residual or recurrent aneurysm. 5. No significant proximal stenosis, aneurysm, or branch vessel occlusion within the Circle of Willis.  Normal CTA of the head. No focal etiology for the hemorrhage.  Micro Data:  MRSA PCR 6/18 > Negative   Antimicrobials:  None  Interim history/subjective:  No acute events overnight, patient continues to follow all commands and remains very alert this a.m.  Currently tolerating SBT well  Objective   Blood pressure (!) 157/101, pulse 88, temperature 98.7 F (37.1 C), temperature source Axillary, resp. rate 18, SpO2 100 %.    Vent Mode: PSV;CPAP FiO2 (%):  [40 %] 40 % Set Rate:  [18 bmp] 18 bmp Vt Set:  [400 mL] 400 mL PEEP:  [5 cmH20] 5 cmH20 Pressure Support:  [5 cmH20-10 cmH20] 5 cmH20 Plateau Pressure:  [10 cmH20-14 cmH20] 14 cmH20   Intake/Output Summary (Last 24 hours) at 10/21/2019 0843 Last data filed at 10/21/2019 0801 Gross per 24 hour  Intake 1200 ml  Output 734.5 ml  Net 465.5 ml   There were no vitals filed for this visit.  Examination: General: Pleasant middle-aged female lying in bed in no acute distress HEENT: ETT, MM pink/moist, PERRL, sclera nonicteric Neuro: Alert and interactive, able to follow commands right side appears slightly weaker CV: s1s2 regular rate and rhythm, no murmur, rubs, or gallops,  PULM: Clear to auscultation bilaterally, no increased work of breathing, no added breath sounds, tolerating ventilator well GI: soft, bowel sounds active in all 4 quadrants, non-tender, non-distended Extremities: warm/dry, no edema  Skin: no rashes or lesions]  Resolved Hospital Problem list   n/a  Assessment & Plan:   Acute hypoxic respiratory failure: intubated for airway protection -In the setting of ICH with IVH and hydrocephalus Plan: Patient tolerating SBT well this  a.m., can likely extubate shortly Able to follow all commands this a.m. For now continue ventilator support with lung protective strategies  Wean PEEP and FiO2 for sats greater than 90%. Head of bed elevated 30 degrees. Plateau pressures less than 30 cm H20.  Follow intermittent  chest x-ray and ABG.   Ensure adequate pulmonary hygiene  VAP bundle in place  PAD protocol  ICH with IVH with worsening hydrocephalus:s/p EVD in setting of HTN -Repeat CTA Head with stable Left Thalamic Hemorrage and increase size in IVH with increase of midline shift  H/O Aneurysm s/p Clip  Plan:: Management per neurology  Maintain neuro protective measures; eurothermia, euglycemia, eunatermia, normoxia Nutrition and bowel regiment  Seizure precautions  Blood pressure goal less than 140 EVD likely removed for the next 24 hours  Hypokalemia  Hypernatremia  Plan: Trend to bmet Supplement as needed SLP eval once extubated  DM Plan: Continue sliding scale insulin and long-acting insulin Will discontinue to be coverage once extubated   Remaining Management per Primary Team  Best practice:  Diet: TF Pain/Anxiety/Delirium protocol (if indicated): yes  VAP protocol (if indicated): yes DVT prophylaxis: holding, SCDs GI prophylaxis: PPI  Glucose control: SSI Mobility: bed level Code Status: Full Family Communication: no family at bedside 6/22 Disposition: ICU  Labs   CBC: Recent Labs  Lab 10/15/19 1922 10/15/19 1923 10/16/19 0548 10/17/19 0623 10/18/19 0539 10/19/19 0457 10/20/19 0451  WBC 9.9   < > 24.0* 17.9* 16.4* 14.9* 13.3*  NEUTROABS 7.6  --   --   --   --   --   --   HGB 14.2   < > 16.0* 13.6 14.0 13.0 11.9*  HCT 45.9   < > 50.8* 43.5 44.3 41.9 39.2  MCV 85.2   < > 84.5 85.6 85.0 87.7 86.9  PLT 201   < > 217 187 169 198 216   < > = values in this interval not displayed.    Basic Metabolic Panel: Recent Labs  Lab 10/17/19 0623 10/17/19 0623 10/18/19 0539 10/19/19 0457 10/19/19 2158 10/20/19 0451 10/21/19 0609  NA 142  --  146* 149* 149* 150*  --   K 3.0*  --  3.4* 3.4* 4.1 3.8  --   CL 105  --  111 115* 116* 116*  --   CO2 24  --  24 23 24 24   --   GLUCOSE 95  --  120* 122* 103* 146*  --   BUN 33*  --  41* 40* 38* 41*  --   CREATININE 2.13*   --  1.02* 0.94 0.81 0.92  --   CALCIUM 9.0  --  8.8* 9.2 9.2 9.0  --   MG 2.2   < > 2.2 2.4 2.4 2.5* 2.5*  PHOS 4.4  --  3.8 4.1  --  4.8* 4.0   < > = values in this interval not displayed.   GFR: CrCl cannot be calculated (Unknown ideal weight.). Recent Labs  Lab 10/17/19 0623 10/18/19 0539 10/19/19 0457 10/20/19 0451  WBC 17.9* 16.4* 14.9* 13.3*    Liver Function Tests: Recent Labs  Lab 10/15/19 1922  AST 19  ALT 17  ALKPHOS 78  BILITOT 1.5*  PROT 7.5  ALBUMIN 4.3   No results for input(s): LIPASE, AMYLASE in the last 168 hours. No results for input(s): AMMONIA in the last 168 hours.  ABG    Component Value Date/Time   PHART 7.486 (H) 10/15/2019 1956   PCO2ART 35.7  10/15/2019 1956   PO2ART 72 (L) 10/15/2019 1956   HCO3 27.0 10/15/2019 1956   TCO2 28 10/15/2019 1956   O2SAT 95.0 10/15/2019 1956     Coagulation Profile: Recent Labs  Lab 10/15/19 1922  INR 1.0    Cardiac Enzymes: No results for input(s): CKTOTAL, CKMB, CKMBINDEX, TROPONINI in the last 168 hours.  HbA1C: Hgb A1c MFr Bld  Date/Time Value Ref Range Status  10/15/2019 07:49 PM 9.1 (H) 4.8 - 5.6 % Final    Comment:    (NOTE)         Prediabetes: 5.7 - 6.4         Diabetes: >6.4         Glycemic control for adults with diabetes: <7.0   01/14/2019 06:43 PM 7.7 (H) 4.8 - 5.6 % Final    Comment:    (NOTE)         Prediabetes: 5.7 - 6.4         Diabetes: >6.4         Glycemic control for adults with diabetes: <7.0     CBG: Recent Labs  Lab 10/20/19 1548 10/20/19 1954 10/20/19 2348 10/21/19 0340 10/21/19 0804  GLUCAP 143* 117* 215* 144* 113*   CRITICAL CARE Performed by: Delfin Gant  Total critical care time: 35 minutes  Critical care time was exclusive of separately billable procedures and treating other patients.  Critical care was necessary to treat or prevent imminent or life-threatening deterioration.  Critical care was time spent personally by me on the  following activities: development of treatment plan with patient and/or surrogate as well as nursing, discussions with consultants, evaluation of patient's response to treatment, examination of patient, obtaining history from patient or surrogate, ordering and performing treatments and interventions, ordering and review of laboratory studies, ordering and review of radiographic studies, pulse oximetry and re-evaluation of patient's condition.  Delfin Gant, NP-C [facility] Contact / Pager information can be found on Amion  10/21/2019, 8:52 AM    PCCM:  54 yo ICH w/ IVH + hydro s/p EVD, malig HTN, AHRF intubated. Now extubated this AM. Doing well.   BP (!) 145/94   Pulse (!) 105   Temp 98.9 F (37.2 C) (Axillary)   Resp (!) 24   SpO2 97%   Gen: fm, intubated on MV, seen before and after extubation this morning.  HENT: EVD in place  Heart: RRR, s1 s2 Lungs: CTAB, no wheeze   Labs reviewed   A: AHRF on MV  ICH w/ IVH, hydrocephalus s/p EVD   P: Weaning on vent this AM Passing SBT SAT Plans to liberate Close obs in ICU overnight   This patient is critically ill with multiple organ system failure; which, requires frequent high complexity decision making, assessment, support, evaluation, and titration of therapies. This was completed through the application of advanced monitoring technologies and extensive interpretation of multiple databases. During this encounter critical care time was devoted to patient care services described in this note for 31 minutes.  Josephine Igo, DO [facility] 10/21/2019 3:22 PM

## 2019-10-21 NOTE — Progress Notes (Signed)
STROKE TEAM PROGRESS NOTE   INTERVAL HISTORY Patient is drowsy but can be aroused.not weaning still needing ventilatory support. BP adequately controlled. .Exam unchanged.  Neurosurgery have raised her ventricle pop-off to 20 cm and plan to clamp tomorrow.  CCM plans to extubate today as she appears to be weaning well OBJECTIVE Vitals:   10/21/19 1200 10/21/19 1300 10/21/19 1400 10/21/19 1500  BP: 134/87 (!) 166/101 (!) 145/94 (!) 161/94  Pulse: 97 (!) 102 (!) 105 (!) 112  Resp: (!) 21 19 (!) 24 20  Temp: 98.9 F (37.2 C)     TempSrc: Axillary     SpO2: 100% 99% 97% 100%   CBC:  Recent Labs  Lab 10/15/19 1922 10/15/19 1923 10/19/19 0457 10/20/19 0451  WBC 9.9   < > 14.9* 13.3*  NEUTROABS 7.6  --   --   --   HGB 14.2   < > 13.0 11.9*  HCT 45.9   < > 41.9 39.2  MCV 85.2   < > 87.7 86.9  PLT 201   < > 198 216   < > = values in this interval not displayed.   Basic Metabolic Panel:  Recent Labs  Lab 10/19/19 2158 10/19/19 2158 10/20/19 0451 10/21/19 0609  NA 149*  --  150*  --   K 4.1  --  3.8  --   CL 116*  --  116*  --   CO2 24  --  24  --   GLUCOSE 103*  --  146*  --   BUN 38*  --  41*  --   CREATININE 0.81  --  0.92  --   CALCIUM 9.2  --  9.0  --   MG 2.4   < > 2.5* 2.5*  PHOS  --   --  4.8* 4.0   < > = values in this interval not displayed.    Lipid Panel:     Component Value Date/Time   CHOL 274 (H) 10/15/2019 1949   TRIG 287 (H) 10/18/2019 0539   HDL 64 10/15/2019 1949   CHOLHDL 4.3 10/15/2019 1949   VLDL 24 10/15/2019 1949   LDLCALC 186 (H) 10/15/2019 1949   HgbA1c:  Lab Results  Component Value Date   HGBA1C 9.1 (H) 10/15/2019   Urine Drug Screen:     Component Value Date/Time   LABOPIA NONE DETECTED 10/16/2019 2204   COCAINSCRNUR NONE DETECTED 10/16/2019 2204   LABBENZ POSITIVE (A) 10/16/2019 2204   AMPHETMU NONE DETECTED 10/16/2019 2204   THCU NONE DETECTED 10/16/2019 2204   LABBARB NONE DETECTED 10/16/2019 2204    Alcohol Level      Component Value Date/Time   ETH <10 10/15/2019 1922    IMAGING past 24h No results found.   PHYSICAL EXAM    General - Well nourished, well developed middle-aged African-American lady, intubated not on sedation  Ophthalmologic - fundi not visualized due to noncooperation.  Cardiovascular - Regular rate and rhythm.  Neuro - intubated drowsy but can easily aroused when stimulated and does follow commands.  Oculocephalic reflexes are intact.  Pupils are reactive to light. Corneal reflex weak bilaterally, gag and cough present. Breathing over the vent.-But only slightly.  Facial symmetry not able to test due to ET tube.  Tongue protrusion not cooperative. Spontaneously moving left UE and LE and withdraw to pain with at least 3/5 strength, RUE 1/5 and RLE 3/5; DTR 1+ and no babinski. Sensation, coordination and gait not tested.    ASSESSMENT/PLAN  Latoya Cox is a 54 y.o. female with DM, HTN, previous hx of aneurysm repair and arthritis, presenting with acute onset of right sided weakness and depressed level of consciousness with garbled speech after complaining of a headache earlier in the day.   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 Intraventricular penetration. Early dilatation of the lateral ventricles.   CT head - No significant interval change in size and morphology of acute intraparenchymal hemorrhage emanating from the left thalamus, estimated volume 11 CC. 5 mm MLS. IVH similar to previous. Associated obstructive hydrocephalus appears slightly worsened from previous.   MRI head - not able to perform due to previous aneurysm clips  CTA Head 6/19 -  Stable size of left thalamic hemorrhage. Increased intraventricular blood. With slight increase in midline shift. Left frontal ventriculostomy catheter is in place. No significant residual or recurrent aneurysm. No significant proximal stenosis,  aneurysm, or branch vessel occlusion within the Circle of Willis. Normal CTA of the head. No focal etiology for the hemorrhage.  Carotid Doppler - unremarkable  2D Echo - EF 60 - 65%. No cardiac source of emboli identified.  Sars Corona Virus 2  - negative  LDL - 186  HgbA1c - 7.7  UDS - benzodiazepine  VTE prophylaxis - Lovenox 40 mg sq daily   No antithrombotic prior to admission, now on No antithrombotic  Therapy recommendations:  CIR   Disposition:  Pending  Obstructive hydrocephalus s/p EVD  CT repeat showed worsening obstructive hydrocephalus  Neurosurgery on board  Status post EVD Latoya Cox)  Repeat CTA 6/19 - Stable size of left thalamic hemorrhage. Increased intraventricular blood. With slight increase in midline shift. Left frontal ventriculostomy catheter is in place. No significant residual or recurrent aneurysm. No significant proximal stenosis, aneurysm, or branch vessel occlusion within the Circle of Willis. Normal CTA of the head. No focal etiology for the hemorrhage.  Acute Respiratory Failure  Intubated  Prn precedex, fentanyl  Failed weaning this am  CCM on board  History of aneurysm  As per husband, it was many years ago  Status post aneurysm clip  Clip not compatible with MRI due to no detailed information from husband  CTA head 6/19 - Stable size of left thalamic hemorrhage. Increased intraventricular blood. With slight increase in midline shift. Left frontal ventriculostomy catheter is in place. No significant residual or recurrent aneurysm. No significant proximal stenosis, aneurysm, or branch vessel occlusion within the Circle of Willis. Normal CTA of the head. No focal etiology for the hemorrhage.  Hypertensive emergency  Home BP meds: Norvasc ; Coreg ; Apresoline  Current BP meds: Norvasc ; Coreg   Treated with cleviprex, now off  Received 3 doses labetalol over night . Increase SBP goal to < 160 mm Hg . Long-term BP goal  normotensive  Hyperlipidemia  Home Lipid lowering medication: none   LDL 186, goal < 70  Current lipid lowering medication: none for now due to Latoya Cox   Consider statin at discharge  Diabetes type II, uncontrolled  Home diabetic meds: Jardiance ; Actos  Current diabetic meds: levemir  SSI  CBG monitoring  HgbA1c 7.7, goal < 7.0  Close PCP follow up  Dysphagia  Due to intubation the stroke  NPO  On tube feeding @ 20  On IV fluid @ 50  increase free water 100 q4h    Speech on board  Other Stroke Risk Factors  Advanced  age  Obesity, s/p bariatric sugery, recommend weight loss, diet and exercise as appropriate   Family hx stroke (mother)   Hx of cardiomyopathy  History aneurysm s/p repair  obstructive sleep apnea per pt report  Other Active Problems  WBCs - 9.9->24K->17.9-16.4-14.9-13.3   (afebrile) (UA 6/18 neg) - not on abxs. CXR 6/20 - NAD  Hypokalemia 3.8    Hypernatremia 150  Lethargic. Unable to get amantadine/ritalin d/t hx QT prolongation   Hospital day # 6 Continue weaning of ventilatory support    and extubate as tolerated.  Continue strict Bp control with SBP goal below 160.  Hopefully extubate today and clamp the ventricle tomorrow.  Discussed with patient, son and Dr. Venetia Maxon  This patient is critically ill and at significant risk of neurological worsening, death and care requires constant monitoring of vital signs, hemodynamics,respiratory and cardiac monitoring, extensive review of multiple databases, frequent neurological assessment, discussion with family, other specialists and medical decision making of high complexity.I have made any additions or clarifications directly to the above note.This critical care time does not reflect procedure time, or teaching time or supervisory time of PA/NP/Med Resident etc but could involve care discussion time.  I spent 30 minutes of neurocritical care time  in the care of  this patient.    Delia Heady, MD  To contact Stroke Continuity provider, please refer to [email or web address]. After hours, contact General Neurology

## 2019-10-21 NOTE — Progress Notes (Signed)
SLP Cancellation Note  Patient Details Name: Latoya Cox MRN: [id number] DOB: [birth year]   Cancelled treatment:        Pt continues with ETT. Will follow for appropriateness for assessment.    Royce Macadamia 10/21/2019, 7:28 AM

## 2019-10-21 NOTE — Progress Notes (Signed)
Physical Therapy Treatment Patient Details Name: Latoya Cox MRN: [id number] DOB: 27-May-1965 Today's Date: 10/21/2019    History of Present Illness 54 y.o. female with DM, HTN and arthritis, [name] and depressed level of consciousness with garbled speech at home, after complaining of a headache earlier in the day. STAT CT head was obtained, revealing an ICH originating from the left basal ganglila, extending into the left lateral, third and 4th ventricles, with a small amount of blood in the right lateral ventricle as well. Early stages of hydrocephalus were also noted on CT. Pt underwent L frontal IVC placement and intubation on 6/18. Pt extubated on 6/23.    PT Comments    Pt limited by lethargy this session, following commands, although delayed. Pt is moving all extremities and is able to assist some with mobility at bed level, although she is generally weak and requires physical assistance for all mobility at this time. Pt also requires assistance to maintain sitting balance at this time, with a strong posterior lean. Pt will benefit from continued PT POC and aggressive mobilization to improve mobility quality and to progress to out of bed activity. PT continues to recommend CIR at this time, although th pt will need to demonstrate improved activity tolerance and arousal to remain a good candidate.  Follow Up Recommendations  CIR     Equipment Recommendations  Hospital bed;Wheelchair (measurements PT);Wheelchair cushion (measurements PT)    Recommendations for Other Services       Precautions / Restrictions Precautions Precautions: Fall;Other (comment) Precaution Comments: EVD Restrictions Weight Bearing Restrictions: No    Mobility  Bed Mobility Overal bed mobility: Needs Assistance Bed Mobility: Rolling;Supine to Sit;Sit to Supine Rolling: Total assist   Supine to sit: Total assist Sit to supine: Total assist       Transfers                    Ambulation/Gait                 Stairs             Wheelchair Mobility    Modified Rankin (Stroke Patients Only) Modified Rankin (Stroke Patients Only) Pre-Morbid Rankin Score: No symptoms Modified Rankin: Severe disability     Balance Overall balance assessment: Needs assistance Sitting-balance support: Bilateral upper extremity supported;Feet unsupported Sitting balance-Leahy Scale: Poor Sitting balance - Comments: modA with BUE support of PT, pt able to pull forward to prevent posterior LOB. Otherwise pt requiring maxA of PT to maintain sitting balance Postural control: Posterior lean                                  Cognition Arousal/Alertness: Lethargic Behavior During Therapy: Flat affect Overall Cognitive Status: Impaired/Different from baseline Area of Impairment: Orientation;Attention;Memory;Following commands;Safety/judgement;Awareness;Problem solving                 Orientation Level: Disoriented to;Place Current Attention Level: Focused Memory: Decreased recall of precautions;Decreased short-term memory Following Commands: Follows one step commands with increased time Safety/Judgement: Decreased awareness of safety;Decreased awareness of deficits Awareness: Intellectual Problem Solving: Slow processing;Requires verbal cues General Comments: pt intermittently opening eyes this session, moreso when sitting up      Exercises      General Comments General comments (skin integrity, edema, etc.): BP 161/94 pre-activity, 144/89 after rolling for hygiene tasks, 152/94 at  end of session      Pertinent Vitals/Pain Pain Assessment: Faces Faces Pain Scale: Hurts a little bit Pain Location: generalized Pain Descriptors / Indicators: Grimacing Pain Intervention(s): Monitored during session    Home Living                      Prior Function            PT Goals (current  goals can now be found in the care plan section) Acute Rehab PT Goals Patient Stated Goal: To improve mobility Progress towards PT goals: Not progressing toward goals - comment (limited by lethargy)    Frequency    Min 4X/week      PT Plan Current plan remains appropriate    Co-evaluation              AM-PAC PT "6 Clicks" Mobility   Outcome Measure  Help needed turning from your back to your side while in a flat bed without using bedrails?: Total Help needed moving from lying on your back to sitting on the side of a flat bed without using bedrails?: Total Help needed moving to and from a bed to a chair (including a wheelchair)?: Total Help needed standing up from a chair using your arms (e.g., wheelchair or bedside chair)?: Total Help needed to walk in hospital room?: Total Help needed climbing 3-5 steps with a railing? : Total 6 Click Score: 6    End of Session Equipment Utilized During Treatment: Oxygen Activity Tolerance: Patient limited by fatigue Patient left: in bed;with call bell/phone within reach;with bed alarm set Nurse Communication: Mobility status;Need for lift equipment PT Visit Diagnosis: Other abnormalities of gait and mobility (R26.89);Muscle weakness (generalized) (M62.81);Other symptoms and signs involving the nervous system (R29.898)     Time: 1735-6701 PT Time Calculation (min) (ACUTE ONLY): 25 min  Charges:  $Therapeutic Activity: 23-37 mins                     Zenaida Niece, PT, DPT Acute Rehabilitation Pager: [phone]    Zenaida Niece 10/21/2019, 4:15 PM

## 2019-10-21 NOTE — Procedures (Signed)
Extubation Procedure Note  Patient Details:   Name: Latoya Cox DOB: [birth year] MRN: [id number]   Airway Documentation:    Vent end date: 10/21/19 Vent end time: 1004   Evaluation  O2 sats: stable throughout Complications: No apparent complications Patient did tolerate procedure well. Bilateral Breath Sounds: Clear, Diminished   Yes   Order received for extubation.  Patient with positive cuff leak prior to extubation.  Placed on 2l nasal cannula.  No stridor noted; patient able to vocalize.  No complications noted.  Lysbeth Penner [facility] 10/21/2019, 10:05 AM

## 2019-10-21 NOTE — Progress Notes (Signed)
Subjective: Patient reports still on vent, but awake, responsive, following commands.  Objective: Vital signs in last 24 hours: Temp:  [98.4 F (36.9 C)-101.5 F (38.6 C)] 98.7 F (37.1 C) (06/23 0400) Pulse Rate:  [82-102] 87 (06/23 0700) Resp:  [0-21] 19 (06/23 0700) BP: (103-160)/(67-103) 144/94 (06/23 0700) SpO2:  [100 %] 100 % (06/23 0700) FiO2 (%):  [40 %] 40 % (06/23 0416)  Intake/Output from previous day: 06/22 0701 - 06/23 0700 In: 1100 [NG/GT:1100] Out: 735.5 [Urine:700; Drains:35.5] Intake/Output this shift: No intake/output data recorded.  Physical Exam: IVC draining well.  Minimal drainage at 15 cm.  Patient remains intubated, but is following commands, appears bright and is nodding to questions.  Lab Results: Recent Labs    10/19/19 0457 10/20/19 0451  WBC 14.9* 13.3*  HGB 13.0 11.9*  HCT 41.9 39.2  PLT 198 216   BMET Recent Labs    10/19/19 2158 10/20/19 0451  NA 149* 150*  K 4.1 3.8  CL 116* 116*  CO2 24 24  GLUCOSE 103* 146*  BUN 38* 41*  CREATININE 0.81 0.92  CALCIUM 9.2 9.0    Studies/Results: DG CHEST PORT 1 VIEW  Result Date: 10/20/2019 CLINICAL DATA:  Intubated. EXAM: PORTABLE CHEST 1 VIEW COMPARISON:  Chest x-ray dated October 18, 2019. FINDINGS: Unchanged endotracheal and enteric tubes. Stable cardiomediastinal silhouette. Normal pulmonary vascularity. Low lung volumes with mild bibasilar atelectasis. No focal consolidation, pleural effusion, or pneumothorax. No acute osseous abnormality. IMPRESSION: 1. Stable support tubes.  No active disease. Electronically Signed   By: Obie Dredge M.D.   On: 10/20/2019 11:55    Assessment/Plan: OK to raise IVC to 20 cm today, clamp tomorrow.  Wean vent today.  Continue support and weaning per Stroke Service and CCM.    LOS: 6 days    Dorian Heckle, MD 10/21/2019, 8:03 AM

## 2019-10-22 ENCOUNTER — Inpatient Hospital Stay (HOSPITAL_COMMUNITY): Payer: No Typology Code available for payment source

## 2019-10-22 DIAGNOSIS — J9601 Acute respiratory failure with hypoxia: Secondary | ICD-10-CM | POA: Diagnosis not present

## 2019-10-22 LAB — GLUCOSE, CAPILLARY
Glucose-Capillary: 106 mg/dL — ABNORMAL HIGH (ref 70–99)
Glucose-Capillary: 121 mg/dL — ABNORMAL HIGH (ref 70–99)
Glucose-Capillary: 136 mg/dL — ABNORMAL HIGH (ref 70–99)
Glucose-Capillary: 158 mg/dL — ABNORMAL HIGH (ref 70–99)
Glucose-Capillary: 190 mg/dL — ABNORMAL HIGH (ref 70–99)
Glucose-Capillary: 193 mg/dL — ABNORMAL HIGH (ref 70–99)

## 2019-10-22 LAB — BASIC METABOLIC PANEL
Anion gap: 14 (ref 5–15)
BUN: 51 mg/dL — ABNORMAL HIGH (ref 6–20)
CO2: 22 mmol/L (ref 22–32)
Calcium: 9.3 mg/dL (ref 8.9–10.3)
Chloride: 117 mmol/L — ABNORMAL HIGH (ref 98–111)
Creatinine, Ser: 1.15 mg/dL — ABNORMAL HIGH (ref 0.44–1.00)
GFR calc Af Amer: >60 mL/min (ref >60)
GFR calc non Af Amer: 55 mL/min — ABNORMAL LOW (ref >60)
Glucose, Bld: 166 mg/dL — ABNORMAL HIGH (ref 70–99)
Potassium: 3.4 mmol/L — ABNORMAL LOW (ref 3.5–5.1)
Sodium: 153 mmol/L — ABNORMAL HIGH (ref 135–145)

## 2019-10-22 LAB — MAGNESIUM: Magnesium: 2.9 mg/dL — ABNORMAL HIGH (ref 1.7–2.4)

## 2019-10-22 MED ORDER — METOPROLOL TARTRATE 5 MG/5ML IV SOLN
INTRAVENOUS | Status: AC
  Administered 2019-10-22: 5 mg
  Filled 2019-10-22: qty 5

## 2019-10-22 MED ORDER — METOPROLOL TARTRATE 5 MG/5ML IV SOLN
2.5000 mg | Freq: Four times a day (QID) | INTRAVENOUS | Status: DC
  Administered 2019-10-22: 2.5 mg via INTRAVENOUS

## 2019-10-22 MED ORDER — METOPROLOL TARTRATE 5 MG/5ML IV SOLN
5.0000 mg | Freq: Four times a day (QID) | INTRAVENOUS | Status: DC
  Administered 2019-10-23 – 2019-10-24 (×6): 5 mg via INTRAVENOUS
  Filled 2019-10-22 (×6): qty 5

## 2019-10-22 MED ORDER — METOPROLOL TARTRATE 5 MG/5ML IV SOLN
INTRAVENOUS | Status: AC
  Filled 2019-10-22: qty 5

## 2019-10-22 MED ORDER — LACTATED RINGERS IV BOLUS
500.0000 mL | Freq: Once | INTRAVENOUS | Status: AC
  Administered 2019-10-22: 500 mL via INTRAVENOUS

## 2019-10-22 NOTE — Progress Notes (Signed)
STROKE TEAM PROGRESS NOTE   INTERVAL HISTORY Patient is drowsy but can be aroused.  And following commands.  She continues to have right hemiplegia and follows commands well on the left side.  She removed her ventriculostomy catheter herself earlier this morning as well as self extubated herself later this morning.  She seems to be breathing all right.  Vital signs are stable.   OBJECTIVE Vitals:   10/22/19 1700 10/22/19 1800 10/22/19 1900 10/22/19 2048  BP: (!) 125/92 132/83 123/88   Pulse: (!) 112 (!) 114 (!) 114   Resp: (!) 8 (!) 22 (!) 23   Temp:    99.9 F (37.7 C)  TempSrc:    Axillary  SpO2: 95% 95% 95%    CBC:  Recent Labs  Lab 10/19/19 0457 10/20/19 0451  WBC 14.9* 13.3*  HGB 13.0 11.9*  HCT 41.9 39.2  MCV 87.7 86.9  PLT 198 216   Basic Metabolic Panel:  Recent Labs  Lab 10/19/19 2158 10/19/19 2158 10/20/19 0451 10/21/19 0609  NA 149*  --  150*  --   K 4.1  --  3.8  --   CL 116*  --  116*  --   CO2 24  --  24  --   GLUCOSE 103*  --  146*  --   BUN 38*  --  41*  --   CREATININE 0.81  --  0.92  --   CALCIUM 9.2  --  9.0  --   MG 2.4   < > 2.5* 2.5*  PHOS  --   --  4.8* 4.0   < > = values in this interval not displayed.    Lipid Panel:     Component Value Date/Time   CHOL 274 (H) 10/15/2019 1949   TRIG 287 (H) 10/18/2019 0539   HDL 64 10/15/2019 1949   CHOLHDL 4.3 10/15/2019 1949   VLDL 24 10/15/2019 1949   LDLCALC 186 (H) 10/15/2019 1949   HgbA1c:  Lab Results  Component Value Date   HGBA1C 9.1 (H) 10/15/2019   Urine Drug Screen:     Component Value Date/Time   LABOPIA NONE DETECTED 10/16/2019 2204   COCAINSCRNUR NONE DETECTED 10/16/2019 2204   LABBENZ POSITIVE (A) 10/16/2019 2204   AMPHETMU NONE DETECTED 10/16/2019 2204   THCU NONE DETECTED 10/16/2019 2204   LABBARB NONE DETECTED 10/16/2019 2204    Alcohol Level     Component Value Date/Time   ETH <10 10/15/2019 1922    IMAGING past 24h CT HEAD WO CONTRAST  Result Date:  10/22/2019 CLINICAL DATA:  Intracranial hemorrhage.  Encephalopathy EXAM: CT HEAD WITHOUT CONTRAST TECHNIQUE: Contiguous axial images were obtained from the base of the skull through the vertex without intravenous contrast. COMPARISON:  CT head 10/17/2019 FINDINGS: Brain: Left medial basal ganglia and thalamic hemorrhage shows interval improvement. No new hemorrhage. No intraventricular hemorrhage is present on today's study. Left ventricular drainage catheter is been removed. No hydrocephalus. Mild midline shift to the right due to the hematoma. Small subdural hygromas along the tentorium bilaterally unchanged. Negative for acute infarct or mass. Vascular: Negative for hyperdense vessel. Aneurysm clip left ophthalmic artery region Skull: Left pterional craniotomy.  No acute skeletal abnormality. Sinuses/Orbits: Mild mucosal edema paranasal sinuses. Negative orbit. Other: None IMPRESSION: Interval improvement in left basal ganglia hematoma. Resolution of intraventricular hemorrhage. Left ventricular drain has been removed.  No hydrocephalus. Electronically Signed   By: Marlan Palau M.D.   On: 10/22/2019 21:06  PHYSICAL EXAM    General - Well nourished, well developed middle-aged African-American lady, not in distress Ophthalmologic - fundi not visualized due to noncooperation.  Cardiovascular - Regular rate and rhythm.  Neuro - drowsy but can easily aroused when stimulated and does follow commands.  Oculocephalic reflexes are intact.  Pupils are reactive to light. Corneal reflex weak bilaterally, gag and cough present. Breathing over the vent.-But only slightly.  Facial symmetry not able to test due to ET tube.  Tongue protrusion not cooperative. Spontaneously moving left UE and LE and withdraw to pain with at least 3/5 strength, RUE 1/5 and RLE 3/5; DTR 1+ and no babinski. Sensation, coordination and gait not tested.    ASSESSMENT/PLAN Ms. Beautifull Cisar is a 54 y.o. female with DM, HTN, previous  hx of aneurysm repair and arthritis, presenting with acute onset of right sided weakness and depressed level of consciousness with garbled speech after complaining of a headache earlier in the day.   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 Intraventricular penetration. Early dilatation of the lateral ventricles.   CT head - No significant interval change in size and morphology of acute intraparenchymal hemorrhage emanating from the left thalamus, estimated volume 11 CC. 5 mm MLS. IVH similar to previous. Associated obstructive hydrocephalus appears slightly worsened from previous.   MRI head - not able to perform due to previous aneurysm clips  CTA Head 6/19 -  Stable size of left thalamic hemorrhage. Increased intraventricular blood. With slight increase in midline shift. Left frontal ventriculostomy catheter is in place. No significant residual or recurrent aneurysm. No significant proximal stenosis, aneurysm, or branch vessel occlusion within the Circle of Willis. Normal CTA of the head. No focal etiology for the hemorrhage.  Carotid Doppler - unremarkable  2D Echo - EF 60 - 65%. No cardiac source of emboli identified.  Sars Corona Virus 2  - negative  LDL - 186  HgbA1c - 7.7  UDS - benzodiazepine  VTE prophylaxis - Lovenox 40 mg sq daily   No antithrombotic prior to admission, now on No antithrombotic  Therapy recommendations:  CIR   Disposition:  Pending  Obstructive hydrocephalus s/p EVD  CT repeat showed worsening obstructive hydrocephalus  Neurosurgery on board  Status post EVD [name]  Repeat CTA 6/19 - Stable size of left thalamic hemorrhage. Increased intraventricular blood. With slight increase in midline shift. Left frontal ventriculostomy catheter is in place. No significant residual or recurrent aneurysm. No significant proximal stenosis, aneurysm, or branch vessel occlusion within  the Circle of Willis. Normal CTA of the head. No focal etiology for the hemorrhage.  Acute Respiratory Failure  Intubated  Prn precedex, fentanyl  Failed weaning this am  CCM on board  History of aneurysm  As per husband, it was many years ago  Status post aneurysm clip  Clip not compatible with MRI due to no detailed information from husband  CTA head 6/19 - Stable size of left thalamic hemorrhage. Increased intraventricular blood. With slight increase in midline shift. Left frontal ventriculostomy catheter is in place. No significant residual or recurrent aneurysm. No significant proximal stenosis, aneurysm, or branch vessel occlusion within the Circle of Willis. Normal CTA of the head. No focal etiology for the hemorrhage.  Hypertensive emergency  Home BP meds: Norvasc ; Coreg ; Apresoline  Current BP meds: Norvasc ; Coreg   Treated with cleviprex, now off  Received 3 doses labetalol  over night . Increase SBP goal to < 160 mm Hg . Long-term BP goal normotensive  Hyperlipidemia  Home Lipid lowering medication: none   LDL 186, goal < 70  Current lipid lowering medication: none for now due to [place]   Consider statin at discharge  Diabetes type II, uncontrolled  Home diabetic meds: Jardiance ; Actos  Current diabetic meds: levemir  SSI  CBG monitoring  HgbA1c 7.7, goal < 7.0  Close PCP follow up  Dysphagia  Due to intubation the stroke  NPO  On tube feeding @ 20  On IV fluid @ 50  increase free water 100 q4h    Speech on board  Other Stroke Risk Factors  Advanced age  Obesity, s/p bariatric sugery, recommend weight loss, diet and exercise as appropriate   Family hx stroke (mother)   Hx of cardiomyopathy  History aneurysm s/p repair  obstructive sleep apnea per pt report  Other Active Problems  WBCs - 9.9->24K->17.9-16.4-14.9-13.3   (afebrile) (UA 6/18 neg) - not on abxs. CXR 6/20 - NAD  Hypokalemia 3.8    Hypernatremia  150  Lethargic. Unable to get amantadine/ritalin d/t hx QT prolongation   Hospital day # 7 Patient has self extubated herself as well as DC'd ventriculostomy catheter.  Recommend close neurological monitoring for worsening exam in that case may need a stat CT scan and possibly repeat ventricle or reintubation.  Continue strict Bp control with SBP goal below 160.    Discussed with patient, and Dr.Icard  This patient is critically ill and at significant risk of neurological worsening, death and care requires constant monitoring of vital signs, hemodynamics,respiratory and cardiac monitoring, extensive review of multiple databases, frequent neurological assessment, discussion with family, other specialists and medical decision making of high complexity.I have made any additions or clarifications directly to the above note.This critical care time does not reflect procedure time, or teaching time or supervisory time of PA/NP/Med Resident etc but could involve care discussion time.  I spent 30 minutes of neurocritical care time  in the care of  this patient.    Antony Contras, MD  To contact Stroke Continuity provider, please refer to [email or web address] After hours, contact General Neurology

## 2019-10-22 NOTE — Progress Notes (Signed)
Nutrition Follow-up  DOCUMENTATION CODES:   Obesity unspecified  INTERVENTION:   If unable to advance diet tomorrow, recommend placement of Cortrak feeding tube and initiation of enteral nutrition. Recommend: - Jevity 1.2 @ 60 ml/hr (1440 ml/day) - Pro-stat 30 ml daily  Recommended tube feeding regimen provides 1828 kcal, 95 grams of protein, and 1162 ml of H2O.   NUTRITION DIAGNOSIS:   Inadequate oral intake related to dysphagia as evidenced by NPO status.  Ongoing  GOAL:   Patient will meet greater than or equal to 90% of their needs  Unmet at this time  MONITOR:   Diet advancement, Labs, Weight trends, Skin, I & O's  REASON FOR ASSESSMENT:   Ventilator, Consult Enteral/tube feeding initiation and management  ASSESSMENT:   54 year old female who presented on 6/17 as a code stroke. PMH of DM and poorly controlled HTN. CT revealed ICH.  6/18 - worsening hydrocephalus, s/p EVD, intubated 6/23 - extubated  SLP following pt with recommendations for NPO. Discussed pt with RN. Possible MBS tomorrow. If unable to advance pt's diet tomorrow, recommend placement of Cortrak NG tube and initiation of enteral nutrition.  Medications reviewed and include: colace, SSI q 4 hours, Novolog 3 units q 4 hours, Levemir 10 units BID, protonix, miralax, senna  Labs reviewed: magnesium 2.5 CBG's: 90-136 x 24 hours  NUTRITION - FOCUSED PHYSICAL EXAM:    Most Recent Value  Orbital Region No depletion  Upper Arm Region No depletion  Thoracic and Lumbar Region No depletion  Buccal Region No depletion  Temple Region No depletion  Clavicle Bone Region Mild depletion  Clavicle and Acromion Bone Region Mild depletion  Scapular Bone Region No depletion  Dorsal Hand No depletion  Patellar Region No depletion  Anterior Thigh Region Mild depletion  Posterior Calf Region No depletion  Edema (RD Assessment) None  Hair Reviewed  Eyes Reviewed  Mouth Reviewed  Skin Reviewed  Nails  Reviewed       Diet Order:   Diet Order            Diet NPO time specified  Diet effective now                 EDUCATION NEEDS:   No education needs have been identified at this time  Skin:  Skin Assessment: Skin Integrity Issues: Incisions: head  Last BM:  10/20/19  Height:   Ht Readings from Last 1 Encounters:  10/05/19 5\' 2"  (1.575 m)    Weight:   Wt Readings from Last 1 Encounters:  10/05/19 78 kg    Ideal Body Weight:  50 kg  BMI:  31.45  Estimated Nutritional Needs:   Kcal:  1800-2000  Protein:  90-110 grams  Fluid:  >/= 1.8 L    12/05/19, MS, RD, LDN Inpatient Clinical Dietitian Pager: [phone] Weekend/After Hours: [phone]

## 2019-10-22 NOTE — Evaluation (Signed)
Speech Language Pathology Evaluation Patient Details Name: Latoya Cox MRN: [id number] DOB: 1966-03-02 Today's Date: 10/22/2019 Time: 1040-1101 SLP Time Calculation (min) (ACUTE ONLY): 21 min  Problem List:  Patient Active Problem List   Diagnosis Date Noted  . Acute hypoxemic respiratory failure (HCC)   . Altered mental status   . ICH (intracerebral hemorrhage) (HCC) 10/15/2019  . Acne 10/05/2019  . Intractable nausea and vomiting 01/14/2019  . Lactic acidosis 01/14/2019  . QT prolongation 01/14/2019  . Suspected COVID-19 virus infection 12/11/2018  . Hx of completed stroke 08/28/2018  . Hypokalemia 10/18/2017  . Overweight (BMI 25.0-29.9) 05/14/2017  . Routine general medical examination at a health care facility 08/02/2016  . Neck fullness 01/27/2016  . Diabetes mellitus type 2 in obese (HCC) 05/17/2015  . Essential hypertension 05/17/2015  . Vitamin D deficiency 05/17/2015   Past Medical History:  Past Medical History:  Diagnosis Date  . Arthritis   . Chest wall pain   . Diabetes mellitus without complication (HCC)   . Hypertension    Past Surgical History:  Past Surgical History:  Procedure Laterality Date  . aneurism repair    . lapband     HPI:  Pt with acute headache and slurred speech, brought by EMS to hospital. CT head was obtained, revealing an ICH originating from the left basal ganglila, extending into the left lateral, third and 4th ventricles, with a small amount of blood in the right lateral ventricle as well. Early stages of hydrocephalus were also noted on CT. Acute hypoxia from respiratory failure, requiring intubation on 10/16/19. Extubated 10/21/19.   Assessment / Plan / Recommendation Clinical Impression  Pt is drowsy with reduced sustained attention, oriented to name only and not able to tell me her birth date despite cues. She is able to reorient when given binary choices. Per family she is very independent at baseline, but today she needs  assistance for initiation, simple problem solving, and one-step commands with limited awareness of her cognitive impairments. Her speech is imprecise and her intelligibility is further impacted by dysphonia that may be more related to intubation. She will benefit from ongoing SLP f/u to maximize functional independence and safety.     SLP Assessment  SLP Recommendation/Assessment: Patient needs continued Speech Lanaguage Pathology Services SLP Visit Diagnosis: Cognitive communication deficit (R41.841)    Follow Up Recommendations  Inpatient Rehab    Frequency and Duration min 2x/week  2 weeks      SLP Evaluation Cognition  Overall Cognitive Status: Impaired/Different from baseline Arousal/Alertness: Lethargic Orientation Level: Oriented to person;Disoriented to place;Disoriented to time;Disoriented to situation Attention: Sustained Sustained Attention: Impaired Sustained Attention Impairment: Verbal basic;Functional basic Memory: Impaired Memory Impairment: Storage deficit;Decreased short term memory;Retrieval deficit Decreased Short Term Memory: Verbal basic Awareness: Impaired Awareness Impairment: Emergent impairment Problem Solving: Impaired Problem Solving Impairment: Functional basic Safety/Judgment: Impaired       Comprehension  Auditory Comprehension Overall Auditory Comprehension: Impaired Commands: Impaired One Step Basic Commands: 75-100% accurate Interfering Components: Attention    Expression Expression Primary Mode of Expression: Verbal Verbal Expression Overall Verbal Expression:  (appearsl functional, needs more assessment)   Oral / Motor  Motor Speech Overall Motor Speech: Impaired Respiration: Within functional limits Phonation: Hoarse Resonance: Within functional limits Articulation: Impaired Level of Impairment: Phrase Intelligibility: Intelligibility reduced Phrase: 50-74% accurate Effective Techniques: Increased vocal intensity   GO                      Mahala Menghini., M.A.  [facility] Pager [phone] Office [phone]  10/22/2019, 11:38 AM

## 2019-10-22 NOTE — Progress Notes (Addendum)
eLink Physician-Brief Progress Note Patient Name: Latoya Cox DOB: 1965-05-08 MRN: [id number]   Date of Service  10/22/2019  HPI/Events of Note  Pt with sinus tachycardia with PAC's and occasional PVC's. BP 157/98  eICU Interventions  Lopressor increased to 5 mg iv Q 6 hours, LR 500 ml iv fluid bolus x 1.        Migdalia Dk 10/22/2019, 9:41 PM

## 2019-10-22 NOTE — Progress Notes (Addendum)
Occupational Therapy Treatment Patient Details Name: Latoya Cox MRN: [id number] DOB: 01-25-1966 Today's Date: 10/22/2019    History of present illness 54 y.o. female with DM, HTN and arthritis, [name] and depressed level of consciousness with garbled speech at home, after complaining of a headache earlier in the day. STAT CT head was obtained, revealing an ICH originating from the left basal ganglila, extending into the left lateral, third and 4th ventricles, with a small amount of blood in the right lateral ventricle as well. Early stages of hydrocephalus were also noted on CT. Pt underwent L frontal IVC placement and intubation on 6/18. Pt extubated on 6/23.   OT comments  Pt progressing well toward stated goals, was extubated 6/23 and EVD pulled. Session focused on BADL mobility progression, RUE facilitation, and improving orientation. Family in room and very supportive. Pt completed bed mobility with total A +2. Once EOB pt needed fluctuating max-mod A for sitting balance with R lateral lean. Placed pt in R lean and had her focus on finding midline x3. Pt also completed R elbow WB and pushing back up to midline x3 with max A. Pt transferred to recliner with total A +2 squat pivot. Pt continues to show cognitive deficits as described below. D/c recs remain appropriate, will continue to follow.   Follow Up Recommendations  CIR;Supervision/Assistance - 24 hour    Equipment Recommendations  None recommended by OT    Recommendations for Other Services      Precautions / Restrictions Precautions Precautions: Fall Restrictions Weight Bearing Restrictions: No       Mobility Bed Mobility Overal bed mobility: Needs Assistance Bed Mobility: Supine to Sit   Sidelying to sit: Total assist;+2 for physical assistance;HOB elevated Supine to sit: Total assist;+2 for physical assistance;+2 for safety/equipment     General bed  mobility comments: assist to initiate legs (also due to impaired cognition- processing and motor planning) in transfer and full support at trunk to attain upright sitting EOB.  Transfers Overall transfer level: Needs assistance Equipment used: 2 person hand held assist Transfers: Sit to/from [employer] Sit to Stand: Total assist;+2 physical assistance   Squat pivot transfers: Total assist;+2 physical assistance     General transfer comment: increased assist on R side due to weakness    Balance Overall balance assessment: Needs assistance Sitting-balance support: No upper extremity supported;Feet supported Sitting balance-Leahy Scale: Zero Sitting balance - Comments: maxA initially, does progress some and demonstrate awareness of R lateral lean Postural control: Right lateral lean Standing balance support: Bilateral upper extremity supported Standing balance-Leahy Scale: Zero Standing balance comment: totalA x2 for standing                           ADL either performed or assessed with clinical judgement   ADL Overall ADL's : Needs assistance/impaired     Grooming: Wash/dry face;Maximal assistance;Sitting       Lower Body Bathing: Total assistance;+2 for physical assistance;Sit to/from stand       Lower Body Dressing: Total assistance;+2 for physical assistance;Sit to/from stand Lower Body Dressing Details (indicate cue type and reason): pt was able to sustain attention to sock, but had difficulty problem solving (initiation) and motor planning to don sock. Assisted pt with figure 4 method, pt continued to require total A Toilet Transfer: Maximal assistance;+2 for physical assistance;BSC;Squat-pivot Toilet Transfer Details (indicate cue type and reason): simulated to  recliner. increased assist on R side due to weakness         Functional mobility during ADLs: Maximal assistance;+2 for physical assistance;+2 for safety/equipment;Cueing for  safety;Cueing for sequencing General ADL Comments: pt able to progress engagement from eval being post extubation     Vision   Vision Assessment?: Vision impaired- to be further tested in functional context Additional Comments: Will continue to assess as alertness improves. Pt maintaining eyes closed most of session. Was able to track family members in room   Perception     Praxis      Cognition Arousal/Alertness: Lethargic Behavior During Therapy: Flat affect Overall Cognitive Status: Impaired/Different from baseline Area of Impairment: Orientation;Attention;Memory;Following commands;Safety/judgement;Awareness;Problem solving                 Orientation Level: Disoriented to;Place;Time;Situation (pt giving non sensical answers) Current Attention Level: Focused Memory: Decreased recall of precautions;Decreased short-term memory Following Commands: Follows one step commands with increased time;Follows one step commands inconsistently Safety/Judgement: Decreased awareness of safety;Decreased awareness of deficits Awareness: Intellectual Problem Solving: Slow processing;Requires verbal cues;Requires tactile cues;Decreased initiation General Comments: pt with difficulty sustaining alertness in session, frequently closing eyes. Pt with slowed processing and needing repitive simple cues to process basic mobility tasks        Exercises     Shoulder Instructions       General Comments VSS on RA during session    Pertinent Vitals/ Pain       Pain Assessment: No/denies pain Faces Pain Scale: No hurt  Home Living                                      Lives With: Spouse;Other (Comment) (two children)    Prior Functioning/Environment              Frequency  Min 2X/week        Progress Toward Goals  OT Goals(current goals can now be found in the care plan section)  Progress towards OT goals: Progressing toward goals  Acute Rehab OT  Goals Patient Stated Goal: To improve mobility OT Goal Formulation: Patient unable to participate in goal setting Time For Goal Achievement: 11/02/19 Potential to Achieve Goals: Good  Plan Discharge plan remains appropriate    Co-evaluation    PT/OT/SLP Co-Evaluation/Treatment: Yes Reason for Co-Treatment: Complexity of the patient's impairments (multi-system involvement);Necessary to address cognition/behavior during functional activity;For patient/therapist safety;To address functional/ADL transfers   OT goals addressed during session: ADL's and self-care;Strengthening/ROM      AM-PAC OT "6 Clicks" Daily Activity     Outcome Measure   Help from another person eating meals?: Total Help from another person taking care of personal grooming?: A Lot Help from another person toileting, which includes using toliet, bedpan, or urinal?: Total Help from another person bathing (including washing, rinsing, drying)?: Total Help from another person to put on and taking off regular upper body clothing?: Total Help from another person to put on and taking off regular lower body clothing?: Total 6 Click Score: 7    End of Session    OT Visit Diagnosis: Other abnormalities of gait and mobility (R26.89);Hemiplegia and hemiparesis;Other symptoms and signs involving the nervous system (R29.898) Hemiplegia - Right/Left: Right Hemiplegia - dominant/non-dominant: Dominant Hemiplegia - caused by: Nontraumatic intracerebral hemorrhage   Activity Tolerance Patient limited by lethargy   Patient Left with call bell/phone within reach;in chair;with chair alarm set;with  family/visitor present   Nurse Communication Mobility status;Need for lift equipment        Time: 9622-2979 OT Time Calculation (min): 31 min  Charges: OT General Charges $OT Visit: 1 Visit OT Treatments $Neuromuscular Re-education: 8-22 mins  Dalphine Handing, MSOT, OTR/L Acute Rehabilitation Services [facility] Office Number:  [phone] Pager: [phone]  Dalphine Handing 10/22/2019, 2:11 PM

## 2019-10-22 NOTE — Progress Notes (Signed)
eLink Physician-Brief Progress Note Patient Name: Latoya Cox DOB: 04-Jun-1965 MRN: [id number]   Date of Service  10/22/2019  HPI/Events of Note  Patient with paroxysmal SVT, no drop in blood pressure, she was on Coreg at home but has not had it in 2 days due to NPO status for mental status changes, she also has not had labs in 2 days., she pulled out her ICP monitoring catheter.  eICU Interventions  Stat 12 lead EKG, BMET, Mg++, Lopressor 2.5 mg iv Q 6 hours (hold for SBP < 120 mmHg, stat head CT to exclude increased ICP, as this can produce arrhythmias as well.        Migdalia Dk 10/22/2019, 8:33 PM

## 2019-10-22 NOTE — Progress Notes (Signed)
Pt pulled out IVC at 4 am. Neuro assessment remains the same. Oncall provider notified.

## 2019-10-22 NOTE — Progress Notes (Signed)
PCCM progress note  Patient extubated 6/23 and continues to remain stable.  Chart check reveals no acute events overnight and patient remains hemodynamically stable.  Spoke with nurse no acute needs identified for critical care.  Therefore, PCCM will sign off. Thank you for the opportunity to participate in this patient's care. Please contact if we can be of further assistance.  Delfin Gant, NP-C [facility] Contact / Pager information can be found on Amion  10/22/2019, 8:05 AM

## 2019-10-22 NOTE — Progress Notes (Signed)
Physical Therapy Treatment Patient Details Name: Latoya Cox MRN: [id number] DOB: [birth year] Today's Date: 10/22/2019    History of Present Illness 54 y.o. female with DM, HTN and arthritis, [name] and depressed level of consciousness with garbled speech at home, after complaining of a headache earlier in the day. STAT CT head was obtained, revealing an ICH originating from the left basal ganglila, extending into the left lateral, third and 4th ventricles, with a small amount of blood in the right lateral ventricle as well. Early stages of hydrocephalus were also noted on CT. Pt underwent L frontal IVC placement and intubation on 6/18. Pt extubated on 6/23.    PT Comments    Pt tolerated treatment well although still remains lethargic and is slow t follow commands. Pt demonstrates improved ability to follow commands from spouse and becomes more alert to his voice during session. Pt demonstrates a R lateral lean during sitting activity but demonstrates improved management of sitting balance with verbal and tactile cues to maintain an upright position.  Pt currently requires totalA for all functional mobility, demonstrating difficulty initiating and trouble sequencing mobility at this time. Pt will continue to benefit from aggressive mobilization to improve activity tolerance and to reduce falls risk. PT continues to recommend CIR placement at this time.  Follow Up Recommendations  CIR     Equipment Recommendations  Hospital bed;Wheelchair (measurements PT);Wheelchair cushion (measurements PT)    Recommendations for Other Services       Precautions / Restrictions Precautions Precautions: Fall Restrictions Weight Bearing Restrictions: No    Mobility  Bed Mobility Overal bed mobility: Needs Assistance Bed Mobility: Supine to Sit   Sidelying to sit: Total assist;+2 for physical assistance;HOB elevated           Transfers Overall transfer level: Needs assistance Equipment used: 2 person hand held assist Transfers: Sit to/from [employer] Sit to Stand: Total assist;+2 physical assistance   Squat pivot transfers: Total assist;+2 physical assistance        Ambulation/Gait                 Stairs             Wheelchair Mobility    Modified Rankin (Stroke Patients Only) Modified Rankin (Stroke Patients Only) Pre-Morbid Rankin Score: No symptoms Modified Rankin: Severe disability     Balance Overall balance assessment: Needs assistance Sitting-balance support: No upper extremity supported;Feet supported Sitting balance-Leahy Scale: Zero Sitting balance - Comments: maxA initially, does progress some and demonstrate awareness of R lateral lean Postural control: Right lateral lean Standing balance support: Bilateral upper extremity supported Standing balance-Leahy Scale: Zero Standing balance comment: totalA x2 for standing                            Cognition Arousal/Alertness: Lethargic Behavior During Therapy: Flat affect Overall Cognitive Status: Impaired/Different from baseline Area of Impairment: Orientation;Attention;Memory;Following commands;Safety/judgement;Awareness;Problem solving                 Orientation Level:  (difficult to assess, slowed processing) Current Attention Level: Focused Memory: Decreased recall of precautions;Decreased short-term memory Following Commands: Follows one step commands with increased time;Follows one step commands inconsistently Safety/Judgement: Decreased awareness of safety;Decreased awareness of deficits Awareness: Intellectual Problem Solving: Slow processing;Requires verbal cues;Requires tactile cues        Exercises      General Comments General comments (  skin integrity, edema, etc.): VSS on RA during session      Pertinent Vitals/Pain Pain Assessment: Faces Faces Pain Scale:  No hurt    Home Living                      Prior Function            PT Goals (current goals can now be found in the care plan section) Acute Rehab PT Goals Patient Stated Goal: To improve mobility Progress towards PT goals: Progressing toward goals    Frequency    Min 4X/week      PT Plan Current plan remains appropriate    Co-evaluation PT/OT/SLP Co-Evaluation/Treatment: Yes Reason for Co-Treatment: Complexity of the patient's impairments (multi-system involvement);Necessary to address cognition/behavior during functional activity;For patient/therapist safety;To address functional/ADL transfers          AM-PAC PT "6 Clicks" Mobility   Outcome Measure  Help needed turning from your back to your side while in a flat bed without using bedrails?: Total Help needed moving from lying on your back to sitting on the side of a flat bed without using bedrails?: Total Help needed moving to and from a bed to a chair (including a wheelchair)?: Total Help needed standing up from a chair using your arms (e.g., wheelchair or bedside chair)?: Total Help needed to walk in hospital room?: Total Help needed climbing 3-5 steps with a railing? : Total 6 Click Score: 6    End of Session Equipment Utilized During Treatment: Gait belt Activity Tolerance: Patient tolerated treatment well Patient left: in chair;with call bell/phone within reach;with chair alarm set Nurse Communication: Mobility status;Need for lift equipment PT Visit Diagnosis: Other abnormalities of gait and mobility (R26.89);Muscle weakness (generalized) (M62.81);Other symptoms and signs involving the nervous system (R29.898)     Time: 0947-0962 PT Time Calculation (min) (ACUTE ONLY): 28 min  Charges:  $Therapeutic Activity: 8-22 mins                     Zenaida Niece, PT, DPT Acute Rehabilitation Pager: [phone]    Zenaida Niece 10/22/2019, 12:21 PM

## 2019-10-22 NOTE — Progress Notes (Signed)
  Speech Language Pathology Treatment: Dysphagia  Patient Details Name: Latoya Cox MRN: [id number] DOB: January 11, 1966 Today's Date: 10/22/2019 Time: 3833-3832 SLP Time Calculation (min) (ACUTE ONLY): 19 min  Assessment / Plan / Recommendation Clinical Impression  Pt appears to be more alert compared to initial swallow evaluation on previous date, although still became increasingly drowsy as PO trials continued. While alert, she was given hand-over-hand assist for self-feeding to facilitate awareness and initiation, but oral phase appeared to be swift. She did not have the same overt coughing as she did on previous date, but there is intermittent throat clearing and her voice remains dysphonic. When prompted to cough, it sounds weak. Overall she appears to be showing signs of improvement, but not ready for a PO diet. Would allow a few ice chips at a time after oral care when alert. Could offer essential medications crushed in puree. SLP will f/u for potential to complete instrumental testing of swallowing.    HPI HPI: Pt with acute headache and slurred speech, brought by EMS to hospital. CT head was obtained, revealing an ICH originating from the left basal ganglila, extending into the left lateral, third and 4th ventricles, with a small amount of blood in the right lateral ventricle as well. Early stages of hydrocephalus were also noted on CT. Acute hypoxia from respiratory failure, requiring intubation on 10/16/19. Extubated 10/21/19.      SLP Plan  Continue with current plan of care       Recommendations  Diet recommendations: NPO;Other(comment) (few ice chips at a time when fully alert) Medication Administration: Crushed with puree                Oral Care Recommendations: Oral care QID;Oral care prior to ice chip/H20 Follow up Recommendations: Inpatient Rehab SLP Visit Diagnosis: Dysphagia, unspecified (R13.10) Plan: Continue with current plan of care       GO                 Mahala Menghini., M.A. CCC-SLP Acute Rehabilitation Services Pager [phone] Office [phone]  10/22/2019, 11:24 AM

## 2019-10-22 NOTE — Progress Notes (Addendum)
Subjective: Patient reports "I'm dealing with all this pain"  Objective: Vital signs in last 24 hours: Temp:  [98.9 F (37.2 C)-102.4 F (39.1 C)] 99.1 F (37.3 C) (06/24 0400) Pulse Rate:  [87-118] 114 (06/24 0500) Resp:  [16-24] 16 (06/24 0500) BP: (133-171)/(85-104) 156/98 (06/24 0500) SpO2:  [97 %-100 %] 98 % (06/24 0500) FiO2 (%):  [40 %] 40 % (06/23 0821)  Intake/Output from previous day: 06/23 0701 - 06/24 0700 In: 160 [NG/GT:160] Out: 507 [Urine:500; Drains:7] Intake/Output this shift: No intake/output data recorded.  Opens eyes to voice briefly, responds to some questions with rambling speech. Follows commands intermittently BLE and LUE.  Pt pulled IVC at 4am - no bleeding from site.   Lab Results: Recent Labs    10/20/19 0451  WBC 13.3*  HGB 11.9*  HCT 39.2  PLT 216   BMET Recent Labs    10/19/19 2158 10/20/19 0451  NA 149* 150*  K 4.1 3.8  CL 116* 116*  CO2 24 24  GLUCOSE 103* 146*  BUN 38* 41*  CREATININE 0.81 0.92  CALCIUM 9.2 9.0    Studies/Results: DG CHEST PORT 1 VIEW  Result Date: 10/20/2019 CLINICAL DATA:  Intubated. EXAM: PORTABLE CHEST 1 VIEW COMPARISON:  Chest x-ray dated October 18, 2019. FINDINGS: Unchanged endotracheal and enteric tubes. Stable cardiomediastinal silhouette. Normal pulmonary vascularity. Low lung volumes with mild bibasilar atelectasis. No focal consolidation, pleural effusion, or pneumothorax. No acute osseous abnormality. IMPRESSION: 1. Stable support tubes.  No active disease. Electronically Signed   By: Obie Dredge M.D.   On: 10/20/2019 11:55    Assessment/Plan:   LOS: 7 days  Continue supportive care.   IVC discontinued.  No need to replace.  Mobilize per Neurology.  Please call with questions or concerns.   Georgiann Cocker 10/22/2019, 7:45 AM

## 2019-10-23 ENCOUNTER — Inpatient Hospital Stay (HOSPITAL_COMMUNITY): Payer: No Typology Code available for payment source

## 2019-10-23 DIAGNOSIS — J9601 Acute respiratory failure with hypoxia: Secondary | ICD-10-CM | POA: Diagnosis not present

## 2019-10-23 LAB — GLUCOSE, CAPILLARY
Glucose-Capillary: 150 mg/dL — ABNORMAL HIGH (ref 70–99)
Glucose-Capillary: 183 mg/dL — ABNORMAL HIGH (ref 70–99)
Glucose-Capillary: 180 mg/dL — ABNORMAL HIGH (ref 70–99)
Glucose-Capillary: 151 mg/dL — ABNORMAL HIGH (ref 70–99)

## 2019-10-23 MED ORDER — SODIUM CHLORIDE 0.9 % IV SOLN
INTRAVENOUS | Status: DC | PRN
  Administered 2019-10-23: 250 mL via INTRAVENOUS

## 2019-10-23 MED ORDER — POTASSIUM CHLORIDE 10 MEQ/100ML IV SOLN
10.0000 meq | INTRAVENOUS | Status: AC
  Administered 2019-10-23 (×4): 10 meq via INTRAVENOUS
  Filled 2019-10-23 (×4): qty 100

## 2019-10-23 MED ORDER — HYDRALAZINE HCL 25 MG PO TABS
25.0000 mg | ORAL_TABLET | Freq: Three times a day (TID) | ORAL | Status: DC
  Administered 2019-10-23 – 2019-10-26 (×8): 25 mg via ORAL
  Filled 2019-10-23 (×8): qty 1

## 2019-10-23 NOTE — Progress Notes (Signed)
STROKE TEAM PROGRESS NOTE   INTERVAL HISTORY Patient remains sleepy but can be aroused easily and follows commands well.  She had become drowsy last night and a stat CT scan of the head was obtained which showed stable ventricular size and hematoma with improvement in intraventricular blood.  Vital signs are stable.  Blood pressure adequately controlled.  OBJECTIVE Vitals:   10/23/19 0500 10/23/19 0600 10/23/19 0700 10/23/19 0800  BP: 109/84 (!) 145/99 120/87   Pulse: (!) 103 99 96   Resp: 20 (!) 23 18   Temp:    99.2 F (37.3 C)  TempSrc:    Axillary  SpO2: 99% 99% 99%   Weight: 69.8 kg      CBC:  Recent Labs  Lab 10/19/19 0457 10/20/19 0451  WBC 14.9* 13.3*  HGB 13.0 11.9*  HCT 41.9 39.2  MCV 87.7 86.9  PLT 198 216   Basic Metabolic Panel:  Recent Labs  Lab 10/20/19 0451 10/20/19 0451 10/21/19 0609 10/22/19 2121  NA 150*  --   --  153*  K 3.8  --   --  3.4*  CL 116*  --   --  117*  CO2 24  --   --  22  GLUCOSE 146*  --   --  166*  BUN 41*  --   --  51*  CREATININE 0.92  --   --  1.15*  CALCIUM 9.0  --   --  9.3  MG 2.5*   < > 2.5* 2.9*  PHOS 4.8*  --  4.0  --    < > = values in this interval not displayed.    Lipid Panel:     Component Value Date/Time   CHOL 274 (H) 10/15/2019 1949   TRIG 287 (H) 10/18/2019 0539   HDL 64 10/15/2019 1949   CHOLHDL 4.3 10/15/2019 1949   VLDL 24 10/15/2019 1949   LDLCALC 186 (H) 10/15/2019 1949   HgbA1c:  Lab Results  Component Value Date   HGBA1C 9.1 (H) 10/15/2019   Urine Drug Screen:     Component Value Date/Time   LABOPIA NONE DETECTED 10/16/2019 2204   COCAINSCRNUR NONE DETECTED 10/16/2019 2204   LABBENZ POSITIVE (A) 10/16/2019 2204   AMPHETMU NONE DETECTED 10/16/2019 2204   THCU NONE DETECTED 10/16/2019 2204   LABBARB NONE DETECTED 10/16/2019 2204    Alcohol Level     Component Value Date/Time   ETH <10 10/15/2019 1922    IMAGING past 24h CT HEAD WO CONTRAST  Result Date: 10/22/2019 CLINICAL DATA:   Intracranial hemorrhage.  Encephalopathy EXAM: CT HEAD WITHOUT CONTRAST TECHNIQUE: Contiguous axial images were obtained from the base of the skull through the vertex without intravenous contrast. COMPARISON:  CT head 10/17/2019 FINDINGS: Brain: Left medial basal ganglia and thalamic hemorrhage shows interval improvement. No new hemorrhage. No intraventricular hemorrhage is present on today's study. Left ventricular drainage catheter is been removed. No hydrocephalus. Mild midline shift to the right due to the hematoma. Small subdural hygromas along the tentorium bilaterally unchanged. Negative for acute infarct or mass. Vascular: Negative for hyperdense vessel. Aneurysm clip left ophthalmic artery region Skull: Left pterional craniotomy.  No acute skeletal abnormality. Sinuses/Orbits: Mild mucosal edema paranasal sinuses. Negative orbit. Other: None IMPRESSION: Interval improvement in left basal ganglia hematoma. Resolution of intraventricular hemorrhage. Left ventricular drain has been removed.  No hydrocephalus. Electronically Signed   By: Marlan Palau M.D.   On: 10/22/2019 21:06     PHYSICAL EXAM  General - Well nourished, well developed middle-aged African-American lady, not in distress Ophthalmologic - fundi not visualized due to noncooperation.  Cardiovascular - Regular rate and rhythm.  Neuro - drowsy but can easily aroused when stimulated and does follow commands.  Oculocephalic reflexes are intact.  Pupils are reactive to light. Corneal reflex weak bilaterally, gag and cough present. Breathing over the vent.-But only slightly.  Mild right lower facial asymmetry.  Tongue protrusion not cooperative. Spontaneously moving left UE and LE and withdraw to pain with at least 3/5 strength, RUE 2/5 and RLE 3/5; DTR 1+ and no babinski. Sensation, coordination and gait not tested.    ASSESSMENT/PLAN [name] is a 54 y.o. female with DM, HTN, previous hx of aneurysm repair and arthritis,  presenting with acute onset of right sided weakness and depressed level of consciousness with garbled speech after complaining of a headache earlier in the day.   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 Intraventricular penetration. Early dilatation of the lateral ventricles.   CT head - No significant interval change in size and morphology of acute intraparenchymal hemorrhage emanating from the left thalamus, estimated volume 11 CC. 5 mm MLS. IVH similar to previous. Associated obstructive hydrocephalus appears slightly worsened from previous.   MRI head - not able to perform due to previous aneurysm clips  CTA Head 6/19 -  Stable size of left thalamic hemorrhage. Increased intraventricular blood. With slight increase in midline shift. Left frontal ventriculostomy catheter is in place. No significant residual or recurrent aneurysm. No significant proximal stenosis, aneurysm, or branch vessel occlusion within the Circle of Willis. Normal CTA of the head. No focal etiology for the hemorrhage.  CT Head 6/24 - Interval improvement in left basal ganglia hematoma. Resolution of intraventricular hemorrhage.  Left ventricular drain has been removed.  No hydrocephalus.  Carotid Doppler - unremarkable  2D Echo - EF 60 - 65%. No cardiac source of emboli identified.  Sars Corona Virus 2  - negative  LDL - 186  HgbA1c - 7.7  UDS - benzodiazepine  VTE prophylaxis - Lovenox 40 mg sq daily   No antithrombotic prior to admission, now on No antithrombotic  Therapy recommendations:  CIR   Disposition:  Pending  Obstructive hydrocephalus s/p EVD  CT repeat showed worsening obstructive hydrocephalus  Neurosurgery on board  Status post EVD [name]  Repeat CTA 6/19 - Stable size of left thalamic hemorrhage. Increased intraventricular blood. With slight increase in midline shift. Left frontal ventriculostomy  catheter is in place. No significant residual or recurrent aneurysm. No significant proximal stenosis, aneurysm, or branch vessel occlusion within the Circle of Willis. Normal CTA of the head. No focal etiology for the hemorrhage.  Acute Respiratory Failure  Intubated  Prn precedex, fentanyl  Failed weaning this am  CCM on board  History of aneurysm  As per husband, it was many years ago  Status post aneurysm clip  Clip not compatible with MRI due to no detailed information from husband  CTA head 6/19 - Stable size of left thalamic hemorrhage. Increased intraventricular blood. With slight increase in midline shift. Left frontal ventriculostomy catheter is in place. No significant residual or recurrent aneurysm. No significant proximal stenosis, aneurysm, or branch vessel occlusion within the Circle of Willis. Normal CTA of the head. No focal etiology for the hemorrhage.  Hypertensive emergency  Home BP meds: Norvasc ; Coreg ; Apresoline  Current BP  meds: Norvasc 10 mg QD ; metoprolol 5 mg IV Q 6 hours (Coreg - D/C'd)  Treated with cleviprex, now off  Received 3 doses labetalol over night . Increase SBP goal to < 160 mm Hg (SBP 109-145) . Long-term BP goal normotensive  Hyperlipidemia  Home Lipid lowering medication: none   LDL 186, goal < 70  Current lipid lowering medication: none for now due to [place]   Consider statin at discharge  Diabetes type II, uncontrolled  Home diabetic meds: Jardiance ; Actos  Current diabetic meds: levemir  SSI  CBG monitoring  HgbA1c 7.7, goal < 7.0  Close PCP follow up  Dysphagia  Due to intubation the stroke  NPO  On tube feeding @ 20  On IV fluid @ 50  increase free water 100 q4h    Speech on board  Other Stroke Risk Factors  Advanced age  Obesity, s/p bariatric sugery, recommend weight loss, diet and exercise as appropriate   Family hx stroke (mother)   Hx of cardiomyopathy  History aneurysm s/p  repair  obstructive sleep apnea per pt report  Other Active Problems  WBCs - 9.9->24K->17.9-16.4-14.9-13.3   (afebrile) (UA 6/18 neg) - not on abxs. CXR 6/20 - NAD  Hypokalemia 3.8->3.4 -> supplement   Hypernatremia 150->153  Lethargic. Unable to get amantadine/ritalin d/t hx QT prolongation  Self extubated and removed ventriculostomy catheter 6/24  Paroxysmal SVT - metoprolol IV 5 mg Q 6 hrs   Hospital day # 8 Patient is doing well and has tolerated ventriculostomy discontinuation and extubation well.  Mobilize out of bed.  Therapy consults.  Rehab consult.  Transfer to neurology floor bed if available.  Long discussion patient and care team and answered questions.  Greater than 50% time during this 35-minute visit was spent in counseling and coordination of care and discussion with care team. Antony Contras, MD      To contact Stroke Continuity provider, please refer to [email or web address] After hours, contact General Neurology

## 2019-10-23 NOTE — Progress Notes (Signed)
  Speech Language Pathology Treatment: Dysphagia  Patient Details Name: Latoya Cox MRN: [id number] DOB: 24-Dec-1965 Today's Date: 10/23/2019 Time: 5643-3295 SLP Time Calculation (min) (ACUTE ONLY): 12 min  Assessment / Plan / Recommendation Clinical Impression  Pt remains disoriented but is more alert today. Mod cues were provided for simple problem solving and emergent awareness throughout self-care tasks. SLP provided diagnostic PO trials with multiple swallows but no overt s/s of aspiration noted. She does remain hoarse s/p extubation though. Will proceed with MBS to better assess oropharyngeal swallow in hopes of being able to start PO diet.    HPI HPI: Pt with acute headache and slurred speech, brought by EMS to hospital. CT head was obtained, revealing an ICH originating from the left basal ganglila, extending into the left lateral, third and 4th ventricles, with a small amount of blood in the right lateral ventricle as well. Early stages of hydrocephalus were also noted on CT. Acute hypoxia from respiratory failure, requiring intubation on 10/16/19. Extubated 10/21/19.      SLP Plan  MBS       Recommendations  Diet recommendations: NPO;Other(comment) (ice chips after oral care) Medication Administration: Crushed with puree                Oral Care Recommendations: Oral care QID Follow up Recommendations: Inpatient Rehab SLP Visit Diagnosis: Dysphagia, unspecified (R13.10) Plan: MBS       GO                Mahala Menghini., M.A. CCC-SLP Acute Rehabilitation Services Pager [phone] Office [phone]  10/23/2019, 9:27 AM

## 2019-10-23 NOTE — Progress Notes (Addendum)
Subjective: Patient reports "I'm doing very well"  Objective: Vital signs in last 24 hours: Temp:  [98.1 F (36.7 C)-100.4 F (38 C)] 99.2 F (37.3 C) (06/25 0800) Pulse Rate:  [72-148] 103 (06/25 0900) Resp:  [8-37] 21 (06/25 0900) BP: (109-174)/(79-122) 152/107 (06/25 0900) SpO2:  [92 %-100 %] 98 % (06/25 0900) Weight:  [69.8 kg] 69.8 kg (06/25 0500)  Intake/Output from previous day: 06/24 0701 - 06/25 0700 In: 512.6 [IV Piggyback:512.6] Out: 1100 [Urine:1100] Intake/Output this shift: No intake/output data recorded.  Alert, responding slowly to questions. Following commands (no movement RUE) extremities. PEARL. Tongue protrudes midline. IVC site without drainage. CT improved per radilogist's review.   Lab Results: No results for input(s): WBC, HGB, HCT, PLT in the last 72 hours. BMET Recent Labs    10/22/19 2121  NA 153*  K 3.4*  CL 117*  CO2 22  GLUCOSE 166*  BUN 51*  CREATININE 1.15*  CALCIUM 9.3    Studies/Results: CT HEAD WO CONTRAST  Result Date: 10/22/2019 CLINICAL DATA:  Intracranial hemorrhage.  Encephalopathy EXAM: CT HEAD WITHOUT CONTRAST TECHNIQUE: Contiguous axial images were obtained from the base of the skull through the vertex without intravenous contrast. COMPARISON:  CT head 10/17/2019 FINDINGS: Brain: Left medial basal ganglia and thalamic hemorrhage shows interval improvement. No new hemorrhage. No intraventricular hemorrhage is present on today's study. Left ventricular drainage catheter is been removed. No hydrocephalus. Mild midline shift to the right due to the hematoma. Small subdural hygromas along the tentorium bilaterally unchanged. Negative for acute infarct or mass. Vascular: Negative for hyperdense vessel. Aneurysm clip left ophthalmic artery region Skull: Left pterional craniotomy.  No acute skeletal abnormality. Sinuses/Orbits: Mild mucosal edema paranasal sinuses. Negative orbit. Other: None IMPRESSION: Interval improvement in left basal  ganglia hematoma. Resolution of intraventricular hemorrhage. Left ventricular drain has been removed.  No hydrocephalus. Electronically Signed   By: Marlan Palau M.D.   On: 10/22/2019 21:06    Assessment/Plan: improving  LOS: 8 days  Continue supportive care. Hopeful for CIR.   Georgiann Cocker 10/23/2019, 9:11 AM   Patient is improving.  Will sign off.  Please call with questions.

## 2019-10-23 NOTE — Progress Notes (Signed)
Modified Barium Swallow Progress Note  Patient Details  Name: Latoya Cox MRN: [id number] Date of Birth: 11/07/1965  Today's Date: 10/23/2019  Modified Barium Swallow completed.  Full report located under Chart Review in the Imaging Section.  Brief recommendations include the following:  Clinical Impression  Pt has a mild oropharyngeal dysphagia. Orally, she has premature spillage of thin liquids and mildly prolonged mastication and transit of solids. Her pharyngeal phase is relatively more functional, but when drinking consecutively via cup or straw, there is trace, silent aspiration of thin liquids that spilled prematurely into the pyriform sinuses. No aspiration is observed with any other consistency. Also considering her mentation, recommend starting with Dys 2 (chopped) diet and nectar thick liquids with good potential to progress given additional time post-extubation and for cognitive therapy.    Swallow Evaluation Recommendations       SLP Diet Recommendations: Dysphagia 2 (Fine chop) solids;Nectar thick liquid   Liquid Administration via: Cup;Straw   Medication Administration: Crushed with puree   Supervision: Staff to assist with self feeding;Full supervision/cueing for compensatory strategies   Compensations: Minimize environmental distractions   Postural Changes: Seated upright at 90 degrees   Oral Care Recommendations: Oral care BID   Other Recommendations: Order thickener from pharmacy;Prohibited food (jello, ice cream, thin soups);Remove water pitcher    Mahala Menghini., M.A. CCC-SLP Acute Rehabilitation Services Pager [phone] Office [phone]  10/23/2019,3:27 PM

## 2019-10-23 NOTE — Progress Notes (Signed)
Pt taken for stat CT scan at this time due to not following commands.

## 2019-10-24 ENCOUNTER — Inpatient Hospital Stay (HOSPITAL_COMMUNITY): Payer: No Typology Code available for payment source

## 2019-10-24 DIAGNOSIS — R000 Tachycardia, unspecified: Secondary | ICD-10-CM

## 2019-10-24 DIAGNOSIS — E860 Dehydration: Secondary | ICD-10-CM | POA: Diagnosis not present

## 2019-10-24 DIAGNOSIS — N179 Acute kidney failure, unspecified: Secondary | ICD-10-CM

## 2019-10-24 DIAGNOSIS — E870 Hyperosmolality and hypernatremia: Secondary | ICD-10-CM | POA: Diagnosis not present

## 2019-10-24 DIAGNOSIS — I615 Nontraumatic intracerebral hemorrhage, intraventricular: Secondary | ICD-10-CM | POA: Diagnosis not present

## 2019-10-24 DIAGNOSIS — I610 Nontraumatic intracerebral hemorrhage in hemisphere, subcortical: Secondary | ICD-10-CM | POA: Diagnosis not present

## 2019-10-24 LAB — GLUCOSE, CAPILLARY
Glucose-Capillary: 146 mg/dL — ABNORMAL HIGH (ref 70–99)
Glucose-Capillary: 160 mg/dL — ABNORMAL HIGH (ref 70–99)
Glucose-Capillary: 165 mg/dL — ABNORMAL HIGH (ref 70–99)
Glucose-Capillary: 169 mg/dL — ABNORMAL HIGH (ref 70–99)
Glucose-Capillary: 173 mg/dL — ABNORMAL HIGH (ref 70–99)
Glucose-Capillary: 174 mg/dL — ABNORMAL HIGH (ref 70–99)
Glucose-Capillary: 209 mg/dL — ABNORMAL HIGH (ref 70–99)
Glucose-Capillary: 252 mg/dL — ABNORMAL HIGH (ref 70–99)
Glucose-Capillary: 354 mg/dL — ABNORMAL HIGH (ref 70–99)
Glucose-Capillary: 380 mg/dL — ABNORMAL HIGH (ref 70–99)

## 2019-10-24 LAB — BASIC METABOLIC PANEL
Anion gap: 16 — ABNORMAL HIGH (ref 5–15)
Anion gap: 13 (ref 5–15)
BUN: 46 mg/dL — ABNORMAL HIGH (ref 6–20)
BUN: 50 mg/dL — ABNORMAL HIGH (ref 6–20)
CO2: 19 mmol/L — ABNORMAL LOW (ref 22–32)
CO2: 23 mmol/L (ref 22–32)
Calcium: 9.2 mg/dL (ref 8.9–10.3)
Calcium: 9.2 mg/dL (ref 8.9–10.3)
Chloride: 120 mmol/L — ABNORMAL HIGH (ref 98–111)
Chloride: 118 mmol/L — ABNORMAL HIGH (ref 98–111)
Creatinine, Ser: 0.98 mg/dL (ref 0.44–1.00)
Creatinine, Ser: 1.3 mg/dL — ABNORMAL HIGH (ref 0.44–1.00)
GFR calc Af Amer: >60 mL/min (ref >60)
GFR calc Af Amer: 55 mL/min — ABNORMAL LOW (ref >60)
GFR calc non Af Amer: >60 mL/min (ref >60)
GFR calc non Af Amer: 47 mL/min — ABNORMAL LOW (ref >60)
Glucose, Bld: 162 mg/dL — ABNORMAL HIGH (ref 70–99)
Glucose, Bld: 237 mg/dL — ABNORMAL HIGH (ref 70–99)
Potassium: 5.6 mmol/L — ABNORMAL HIGH (ref 3.5–5.1)
Potassium: 4.6 mmol/L (ref 3.5–5.1)
Sodium: 155 mmol/L — ABNORMAL HIGH (ref 135–145)
Sodium: 154 mmol/L — ABNORMAL HIGH (ref 135–145)

## 2019-10-24 LAB — URINALYSIS, COMPLETE (UACMP) WITH MICROSCOPIC
Bacteria, UA: NONE SEEN
Bilirubin Urine: NEGATIVE
Glucose, UA: >=500 mg/dL — AB
Hgb urine dipstick: NEGATIVE
Ketones, ur: 5 mg/dL — AB
Nitrite: NEGATIVE
Protein, ur: 30 mg/dL — AB
Specific Gravity, Urine: 1.029 (ref 1.005–1.030)
pH: 5 (ref 5.0–8.0)

## 2019-10-24 MED ORDER — PANTOPRAZOLE SODIUM 40 MG PO TBEC
40.0000 mg | DELAYED_RELEASE_TABLET | Freq: Every day | ORAL | Status: DC
  Administered 2019-10-24 – 2019-11-22 (×30): 40 mg via ORAL
  Filled 2019-10-24 (×30): qty 1

## 2019-10-24 MED ORDER — POLYETHYLENE GLYCOL 3350 17 G PO PACK
17.0000 g | PACK | Freq: Every day | ORAL | Status: DC
  Administered 2019-10-24 – 2019-11-23 (×25): 17 g via ORAL
  Filled 2019-10-24 (×29): qty 1

## 2019-10-24 MED ORDER — CARVEDILOL 12.5 MG PO TABS
25.0000 mg | ORAL_TABLET | Freq: Two times a day (BID) | ORAL | Status: DC
  Administered 2019-10-24 – 2019-11-23 (×59): 25 mg via ORAL
  Filled 2019-10-24 (×59): qty 2

## 2019-10-24 MED ORDER — SODIUM CHLORIDE 0.45 % IV SOLN: INTRAVENOUS | Status: DC

## 2019-10-24 MED ORDER — METOPROLOL TARTRATE 50 MG PO TABS: 50.0000 mg | ORAL_TABLET | Freq: Two times a day (BID) | ORAL | Status: DC

## 2019-10-24 MED ORDER — DOCUSATE SODIUM 50 MG/5ML PO LIQD
100.0000 mg | Freq: Two times a day (BID) | ORAL | Status: DC
  Administered 2019-10-24 – 2019-11-22 (×53): 100 mg via ORAL
  Filled 2019-10-24 (×57): qty 10

## 2019-10-24 MED ORDER — SENNOSIDES-DOCUSATE SODIUM 8.6-50 MG PO TABS
1.0000 | ORAL_TABLET | Freq: Two times a day (BID) | ORAL | Status: DC
  Administered 2019-10-24 – 2019-11-23 (×56): 1 via ORAL
  Filled 2019-10-24 (×58): qty 1

## 2019-10-24 MED ORDER — SODIUM CHLORIDE 0.9 % IV SOLN: INTRAVENOUS | Status: DC

## 2019-10-24 MED ORDER — AMLODIPINE BESYLATE 10 MG PO TABS
10.0000 mg | ORAL_TABLET | Freq: Every day | ORAL | Status: DC
  Administered 2019-10-24 – 2019-11-03 (×11): 10 mg via ORAL
  Filled 2019-10-24 (×11): qty 1

## 2019-10-24 NOTE — Progress Notes (Signed)
Subjective: NAEs o/n  Objective: Vital signs in last 24 hours: Temp:  [98.1 F (36.7 C)-99.3 F (37.4 C)] 98.9 F (37.2 C) (06/26 0734) Pulse Rate:  [90-107] 97 (06/26 0734) Resp:  [13-20] 16 (06/26 0734) BP: (113-158)/(86-107) 131/89 (06/26 0734) SpO2:  [96 %-99 %] 96 % (06/26 0734)  Intake/Output from previous day: 06/25 0701 - 06/26 0700 In: 400.1 [I.V.:120.9; IV Piggyback:279.2] Out: 600 [Urine:600] Intake/Output this shift: No intake/output data recorded.  Awake, alert, Ox1 FC on left Incision c/d  Lab Results: No results for input(s): WBC, HGB, HCT, PLT in the last 72 hours. BMET Recent Labs    10/22/19 2121 10/24/19 0337  NA 153* 155*  K 3.4* 5.6*  CL 117* 120*  CO2 22 19*  GLUCOSE 166* 162*  BUN 51* 46*  CREATININE 1.15* 0.98  CALCIUM 9.3 9.2    Studies/Results: CT HEAD WO CONTRAST  Result Date: 10/22/2019 CLINICAL DATA:  Intracranial hemorrhage.  Encephalopathy EXAM: CT HEAD WITHOUT CONTRAST TECHNIQUE: Contiguous axial images were obtained from the base of the skull through the vertex without intravenous contrast. COMPARISON:  CT head 10/17/2019 FINDINGS: Brain: Left medial basal ganglia and thalamic hemorrhage shows interval improvement. No new hemorrhage. No intraventricular hemorrhage is present on today's study. Left ventricular drainage catheter is been removed. No hydrocephalus. Mild midline shift to the right due to the hematoma. Small subdural hygromas along the tentorium bilaterally unchanged. Negative for acute infarct or mass. Vascular: Negative for hyperdense vessel. Aneurysm clip left ophthalmic artery region Skull: Left pterional craniotomy.  No acute skeletal abnormality. Sinuses/Orbits: Mild mucosal edema paranasal sinuses. Negative orbit. Other: None IMPRESSION: Interval improvement in left basal ganglia hematoma. Resolution of intraventricular hemorrhage. Left ventricular drain has been removed.  No hydrocephalus. Electronically Signed   By:  Franchot Gallo M.D.   On: 10/22/2019 21:06   DG Swallowing Func-Speech Pathology  Result Date: 10/23/2019 Objective Swallowing Evaluation: Type of Study: MBS-Modified Barium Swallow Study  Patient Details Name: Latoya Cox MRN: [id number] Date of Birth: 07-28-1965 Today's Date: 10/23/2019 Time: SLP Start Time (ACUTE ONLY): 1339 -SLP Stop Time (ACUTE ONLY): 1357 SLP Time Calculation (min) (ACUTE ONLY): 18 min Past Medical History: Past Medical History: Diagnosis Date . Arthritis  . Chest wall pain  . Diabetes mellitus without complication ([place])  . Hypertension  Past Surgical History: Past Surgical History: Procedure Laterality Date . aneurism repair   . lapband   HPI: Pt with acute headache and slurred speech, brought by EMS to hospital. CT head was obtained, revealing an ICH originating from the left basal ganglila, extending into the left lateral, third and 4th ventricles, with a small amount of blood in the right lateral ventricle as well. Early stages of hydrocephalus were also noted on CT. Acute hypoxia from respiratory failure, requiring intubation on 10/16/19. Extubated 10/21/19.  Subjective: alert, cooperative, needs cues Assessment / Plan / Recommendation CHL IP CLINICAL IMPRESSIONS 10/23/2019 Clinical Impression Pt has a mild oropharyngeal dysphagia. Orally, she has premature spillage of thin liquids and mildly prolonged mastication and transit of solids. Her pharyngeal phase is relatively more functional, but when drinking consecutively via cup or straw, there is trace, silent aspiration of thin liquids that spilled prematurely into the pyriform sinuses. No aspiration is observed with any other consistency. Also considering her mentation, recommend starting with Dys 2 (chopped) diet and nectar thick liquids with good potential to progress given additional time post-extubation and for cognitive therapy.  SLP Visit Diagnosis Dysphagia, oropharyngeal phase (R13.12) Attention and concentration deficit  following  -- Frontal lobe and executive function deficit following -- Impact on safety and function Mild aspiration risk   CHL IP TREATMENT RECOMMENDATION 10/23/2019 Treatment Recommendations Therapy as outlined in treatment plan below   Prognosis 10/23/2019 Prognosis for Safe Diet Advancement Good Barriers to Reach Goals Cognitive deficits Barriers/Prognosis Comment -- CHL IP DIET RECOMMENDATION 10/23/2019 SLP Diet Recommendations Dysphagia 2 (Fine chop) solids;Nectar thick liquid Liquid Administration via Cup;Straw Medication Administration Crushed with puree Compensations Minimize environmental distractions Postural Changes Seated upright at 90 degrees   CHL IP OTHER RECOMMENDATIONS 10/23/2019 Recommended Consults -- Oral Care Recommendations Oral care BID Other Recommendations Order thickener from pharmacy;Prohibited food (jello, ice cream, thin soups);Remove water pitcher   CHL IP FOLLOW UP RECOMMENDATIONS 10/23/2019 Follow up Recommendations Inpatient Rehab   CHL IP FREQUENCY AND DURATION 10/23/2019 Speech Therapy Frequency (ACUTE ONLY) min 2x/week Treatment Duration 2 weeks      CHL IP ORAL PHASE 10/23/2019 Oral Phase Impaired Oral - Pudding Teaspoon -- Oral - Pudding Cup -- Oral - Honey Teaspoon -- Oral - Honey Cup -- Oral - Nectar Teaspoon -- Oral - Nectar Cup WFL Oral - Nectar Straw WFL Oral - Thin Teaspoon -- Oral - Thin Cup Premature spillage Oral - Thin Straw Premature spillage Oral - Puree WFL Oral - Mech Soft Impaired mastication;Delayed oral transit Oral - Regular -- Oral - Multi-Consistency -- Oral - Pill -- Oral Phase - Comment --  CHL IP PHARYNGEAL PHASE 10/23/2019 Pharyngeal Phase Impaired Pharyngeal- Pudding Teaspoon -- Pharyngeal -- Pharyngeal- Pudding Cup -- Pharyngeal -- Pharyngeal- Honey Teaspoon -- Pharyngeal -- Pharyngeal- Honey Cup -- Pharyngeal -- Pharyngeal- Nectar Teaspoon -- Pharyngeal -- Pharyngeal- Nectar Cup WFL Pharyngeal -- Pharyngeal- Nectar Straw WFL Pharyngeal -- Pharyngeal- Thin Teaspoon --  Pharyngeal -- Pharyngeal- Thin Cup Reduced airway/laryngeal closure;Penetration/Aspiration during swallow Pharyngeal Material enters airway, passes BELOW cords without attempt by patient to eject out (silent aspiration) Pharyngeal- Thin Straw Reduced airway/laryngeal closure;Penetration/Aspiration during swallow Pharyngeal Material enters airway, passes BELOW cords without attempt by patient to eject out (silent aspiration) Pharyngeal- Puree WFL Pharyngeal -- Pharyngeal- Mechanical Soft WFL Pharyngeal -- Pharyngeal- Regular -- Pharyngeal -- Pharyngeal- Multi-consistency -- Pharyngeal -- Pharyngeal- Pill -- Pharyngeal -- Pharyngeal Comment --  CHL IP CERVICAL ESOPHAGEAL PHASE 10/23/2019 Cervical Esophageal Phase WFL Pudding Teaspoon -- Pudding Cup -- Honey Teaspoon -- Honey Cup -- Nectar Teaspoon -- Nectar Cup -- Nectar Straw -- Thin Teaspoon -- Thin Cup -- Thin Straw -- Puree -- Mechanical Soft -- Regular -- Multi-consistency -- Pill -- Cervical Esophageal Comment -- Mahala Menghini., M.A. CCC-SLP Acute Rehabilitation Services Pager [phone] Office [phone] 10/23/2019, 3:28 PM               Assessment/Plan: S/p EVD removal -cont supportive care  Bedelia Person 10/24/2019, 10:02 AM

## 2019-10-24 NOTE — Progress Notes (Signed)
  Speech Language Pathology Treatment: Dysphagia  Patient Details Name: Latoya Cox MRN: [id number] DOB: Dec 16, 1965 Today's Date: 10/24/2019 Time: 7893-8101 SLP Time Calculation (min) (ACUTE ONLY): 11 min  Assessment / Plan / Recommendation Clinical Impression  Pt is drowsy, confused, but can sustain alertness long enough to try some bites/sips off her lunch tray. She consumed four ounces of nectar thick liquids and under half of her magic cup with good oral clearance and no overt coughing. More advanced solids from tray were deferred given her mentation. Allowing her to self-feed did help her attend to PO trials better, but she needs cues to initiate self-feeding. At times she needed help to use the cup, rubbing it around her face instead of bringing it to her lips. She will benefit from full supervision still for safety. Would maintain current diet for when she is more alert.    HPI HPI: Pt with acute headache and slurred speech, brought by EMS to hospital. CT head was obtained, revealing an ICH originating from the left basal ganglila, extending into the left lateral, third and 4th ventricles, with a small amount of blood in the right lateral ventricle as well. Early stages of hydrocephalus were also noted on CT. Acute hypoxia from respiratory failure, requiring intubation on 10/16/19. Extubated 10/21/19.      SLP Plan  Continue with current plan of care       Recommendations  Diet recommendations: Dysphagia 2 (fine chop);Nectar-thick liquid Liquids provided via: Cup;Straw Medication Administration: Crushed with puree Supervision: Staff to assist with self feeding;Full supervision/cueing for compensatory strategies Compensations: Minimize environmental distractions Postural Changes and/or Swallow Maneuvers: Seated upright 90 degrees                Oral Care Recommendations: Oral care BID Follow up Recommendations: Inpatient Rehab SLP Visit Diagnosis: Dysphagia, oropharyngeal phase  (R13.12) Plan: Continue with current plan of care       GO               Mahala Menghini., M.A. CCC-SLP Acute Rehabilitation Services Pager [phone] Office [phone]  10/24/2019, 1:43 PM

## 2019-10-24 NOTE — Progress Notes (Addendum)
STROKE TEAM PROGRESS NOTE   INTERVAL HISTORY RN at bedside.  Patient lying in bed, lethargic, but able to open eyes and follow limited simple commands.  Still has intermittent word salad, but right arm and leg strength improving.  Did not eat well today, will put on IV fluid.  Sodium 154.  Creatinine 1.3.  No fever.  OBJECTIVE Vitals:   10/24/19 0500 10/24/19 0600 10/24/19 0734 10/24/19 1148  BP:   131/89 124/88  Pulse:   97 (!) 108  Resp: 16 18 16 16   Temp:   98.9 F (37.2 C) 98 F (36.7 C)  TempSrc:   Oral Oral  SpO2:   96% 96%  Weight:       CBC:  Recent Labs  Lab 10/19/19 0457 10/20/19 0451  WBC 14.9* 13.3*  HGB 13.0 11.9*  HCT 41.9 39.2  MCV 87.7 86.9  PLT 198 216   Basic Metabolic Panel:  Recent Labs  Lab 10/20/19 0451 10/20/19 0451 10/21/19 0609 10/22/19 2121 10/24/19 0337  NA 150*   < >  --  153* 155*  K 3.8   < >  --  3.4* 5.6*  CL 116*   < >  --  117* 120*  CO2 24   < >  --  22 19*  GLUCOSE 146*   < >  --  166* 162*  BUN 41*   < >  --  51* 46*  CREATININE 0.92   < >  --  1.15* 0.98  CALCIUM 9.0   < >  --  9.3 9.2  MG 2.5*   < > 2.5* 2.9*  --   PHOS 4.8*  --  4.0  --   --    < > = values in this interval not displayed.    Lipid Panel:     Component Value Date/Time   CHOL 274 (H) 10/15/2019 1949   TRIG 287 (H) 10/18/2019 0539   HDL 64 10/15/2019 1949   CHOLHDL 4.3 10/15/2019 1949   VLDL 24 10/15/2019 1949   LDLCALC 186 (H) 10/15/2019 1949   HgbA1c:  Lab Results  Component Value Date   HGBA1C 9.1 (H) 10/15/2019   Urine Drug Screen:     Component Value Date/Time   LABOPIA NONE DETECTED 10/16/2019 2204   COCAINSCRNUR NONE DETECTED 10/16/2019 2204   LABBENZ POSITIVE (A) 10/16/2019 2204   AMPHETMU NONE DETECTED 10/16/2019 2204   THCU NONE DETECTED 10/16/2019 2204   LABBARB NONE DETECTED 10/16/2019 2204    Alcohol Level     Component Value Date/Time   ETH <10 10/15/2019 1922    IMAGING past 24h DG Swallowing Func-Speech  Pathology  Result Date: 10/23/2019 Objective Swallowing Evaluation: Type of Study: MBS-Modified Barium Swallow Study  Patient Details Name: Meera Vasco MRN: [name] Date of Birth: Nov 10, 1965 Today's Date: 10/23/2019 Time: SLP Start Time (ACUTE ONLY): 1339 -SLP Stop Time (ACUTE ONLY): 1357 SLP Time Calculation (min) (ACUTE ONLY): 18 min Past Medical History: Past Medical History: Diagnosis Date . Arthritis  . Chest wall pain  . Diabetes mellitus without complication (HCC)  . Hypertension  Past Surgical History: Past Surgical History: Procedure Laterality Date . aneurism repair   . lapband   HPI: Pt with acute headache and slurred speech, brought by EMS to hospital. CT head was obtained, revealing an ICH originating from the left basal ganglila, extending into the left lateral, third and 4th ventricles, with a small amount of blood in the right lateral ventricle as well. Early stages  of hydrocephalus were also noted on CT. Acute hypoxia from respiratory failure, requiring intubation on 10/16/19. Extubated 10/21/19.  Subjective: alert, cooperative, needs cues Assessment / Plan / Recommendation CHL IP CLINICAL IMPRESSIONS 10/23/2019 Clinical Impression Pt has a mild oropharyngeal dysphagia. Orally, she has premature spillage of thin liquids and mildly prolonged mastication and transit of solids. Her pharyngeal phase is relatively more functional, but when drinking consecutively via cup or straw, there is trace, silent aspiration of thin liquids that spilled prematurely into the pyriform sinuses. No aspiration is observed with any other consistency. Also considering her mentation, recommend starting with Dys 2 (chopped) diet and nectar thick liquids with good potential to progress given additional time post-extubation and for cognitive therapy.  SLP Visit Diagnosis Dysphagia, oropharyngeal phase (R13.12) Attention and concentration deficit following -- Frontal lobe and executive function deficit following -- Impact on  safety and function Mild aspiration risk   CHL IP TREATMENT RECOMMENDATION 10/23/2019 Treatment Recommendations Therapy as outlined in treatment plan below   Prognosis 10/23/2019 Prognosis for Safe Diet Advancement Good Barriers to Reach Goals Cognitive deficits Barriers/Prognosis Comment -- CHL IP DIET RECOMMENDATION 10/23/2019 SLP Diet Recommendations Dysphagia 2 (Fine chop) solids;Nectar thick liquid Liquid Administration via Cup;Straw Medication Administration Crushed with puree Compensations Minimize environmental distractions Postural Changes Seated upright at 90 degrees   CHL IP OTHER RECOMMENDATIONS 10/23/2019 Recommended Consults -- Oral Care Recommendations Oral care BID Other Recommendations Order thickener from pharmacy;Prohibited food (jello, ice cream, thin soups);Remove water pitcher   CHL IP FOLLOW UP RECOMMENDATIONS 10/23/2019 Follow up Recommendations Inpatient Rehab   CHL IP FREQUENCY AND DURATION 10/23/2019 Speech Therapy Frequency (ACUTE ONLY) min 2x/week Treatment Duration 2 weeks      CHL IP ORAL PHASE 10/23/2019 Oral Phase Impaired Oral - Pudding Teaspoon -- Oral - Pudding Cup -- Oral - Honey Teaspoon -- Oral - Honey Cup -- Oral - Nectar Teaspoon -- Oral - Nectar Cup WFL Oral - Nectar Straw WFL Oral - Thin Teaspoon -- Oral - Thin Cup Premature spillage Oral - Thin Straw Premature spillage Oral - Puree WFL Oral - Mech Soft Impaired mastication;Delayed oral transit Oral - Regular -- Oral - Multi-Consistency -- Oral - Pill -- Oral Phase - Comment --  CHL IP PHARYNGEAL PHASE 10/23/2019 Pharyngeal Phase Impaired Pharyngeal- Pudding Teaspoon -- Pharyngeal -- Pharyngeal- Pudding Cup -- Pharyngeal -- Pharyngeal- Honey Teaspoon -- Pharyngeal -- Pharyngeal- Honey Cup -- Pharyngeal -- Pharyngeal- Nectar Teaspoon -- Pharyngeal -- Pharyngeal- Nectar Cup WFL Pharyngeal -- Pharyngeal- Nectar Straw WFL Pharyngeal -- Pharyngeal- Thin Teaspoon -- Pharyngeal -- Pharyngeal- Thin Cup Reduced airway/laryngeal  closure;Penetration/Aspiration during swallow Pharyngeal Material enters airway, passes BELOW cords without attempt by patient to eject out (silent aspiration) Pharyngeal- Thin Straw Reduced airway/laryngeal closure;Penetration/Aspiration during swallow Pharyngeal Material enters airway, passes BELOW cords without attempt by patient to eject out (silent aspiration) Pharyngeal- Puree WFL Pharyngeal -- Pharyngeal- Mechanical Soft WFL Pharyngeal -- Pharyngeal- Regular -- Pharyngeal -- Pharyngeal- Multi-consistency -- Pharyngeal -- Pharyngeal- Pill -- Pharyngeal -- Pharyngeal Comment --  CHL IP CERVICAL ESOPHAGEAL PHASE 10/23/2019 Cervical Esophageal Phase WFL Pudding Teaspoon -- Pudding Cup -- Honey Teaspoon -- Honey Cup -- Nectar Teaspoon -- Nectar Cup -- Nectar Straw -- Thin Teaspoon -- Thin Cup -- Thin Straw -- Puree -- Mechanical Soft -- Regular -- Multi-consistency -- Pill -- Cervical Esophageal Comment -- Osie Bond., M.A. [facility] Pager [phone] Office [phone] 10/23/2019, 3:28 PM  PHYSICAL EXAM     Temp:  [98 F (36.7 C)-99.3 F (37.4 C)] 98 F (36.7 C) (06/26 1148) Pulse Rate:  [94-108] 108 (06/26 1148) Resp:  [15-20] 16 (06/26 1148) BP: (113-158)/(86-104) 124/88 (06/26 1148) SpO2:  [96 %-97 %] 96 % (06/26 1148)  General - Well nourished, well developed middle-aged African-American lady, not in distress  Ophthalmologic - fundi not visualized due to noncooperation.  Cardiovascular - Regular rate and rhythm.  Neuro - lethargic but can easily aroused when stimulated. Able to tell me her name but then perseverated on name for orientation questions. Intermittent word salad. Able to follow some simple commands but not all of them.  Pupils are equal reactive to light. Blinking to visual threat bilaterally. No gaze palsy. Right lower facial weakness. Tongue protrusion midline. Left UE 4/5, LLE 3/5, RLE 2/5 proximal and 3/5 distal, RUE 3/5. DTR 1+ and  no babinski. Sensation, coordination not cooperative and gait not tested.    ASSESSMENT/PLAN Ms. Kemya Shed is a 54 y.o. female with DM, HTN, previous hx of aneurysm repair and arthritis, presenting with acute onset of right sided weakness and depressed level of consciousness with garbled speech after complaining of a headache earlier in the day.   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 Intraventricular penetration. Early dilatation of the lateral ventricles.   CT head - No significant interval change in size and morphology of acute intraparenchymal hemorrhage emanating from the left thalamus, estimated volume 11 CC. 5 mm MLS. IVH similar to previous. Associated obstructive hydrocephalus appears slightly worsened from previous.   MRI head - not able to perform due to previous aneurysm clips  CTA Head 6/19 - No significant residual or recurrent aneurysm.   CT Head 6/24 - Interval improvement in left BGa hematoma. Resolution of IVH. No hydrocephalus.  Carotid Doppler - unremarkable  2D Echo - EF 60 - 65%. No cardiac source of emboli identified.  Sars Corona Virus 2  - negative  LDL - 186  HgbA1c - 7.7  UDS - benzodiazepine  VTE prophylaxis - Lovenox 40 mg sq daily   No antithrombotic prior to admission, now on No antithrombotic  Therapy recommendations:  CIR   Disposition:  Pending  Obstructive hydrocephalus s/p EVD  CT repeat showed worsening obstructive hydrocephalus  Neurosurgery on board  Status post EVD [name]  CT Head 6/24 - Interval improvement in left BGa hematoma. Resolution of IVH. No hydrocephalus.   EVD self removed 6/24  Acute Respiratory Failure  Intubated  Prn precedex, fentanyl  CCM on board  Self extubation 6/24 - tolerating well  Hypernatremia and AKI  Na 155->154  Creatinine 0.92-1.15-0.98-1.30  Put on 1/2 NS @ 75  Encourage po  intake  History of aneurysm  As per husband, it was many years ago  Status post aneurysm clip  Clip not compatible with MRI due to no detailed information from husband  CTA head 6/19 - No significant residual or recurrent aneurysm.   Hypertensive emergency  Home BP meds: Norvasc ; Coreg ; Apresoline  Current BP meds: home meds resumed  Treated with cleviprex, now off . Long-term BP goal normotensive  Hyperlipidemia  Home Lipid lowering medication: none   LDL 186, goal < 70  No statin for now due to ICH   Consider statin at discharge  Diabetes type II, uncontrolled  Home diabetic meds: Jardiance ; Actos  Current diabetic  meds: levemir  SSI  CBG monitoring  HgbA1c 7.7, goal < 7.0  Close PCP follow up  Dysphagia  Due to intubation the stroke  NPO -> tube feeding-> dysphagia 2 diet nectar thick liquids  1/2 NS at 75   Speech on board  Other Stroke Risk Factors  Advanced age  Obesity, s/p bariatric sugery, recommend weight loss, diet and exercise as appropriate   Family hx stroke (mother)   Hx of cardiomyopathy  History aneurysm s/p repair  obstructive sleep apnea per pt report  Other Active Problems  WBCs - 9.9->24K->17.9-16.4-14.9-13.3  Hypokalemia 3.8->3.4 ->5.6->4.6  Lethargic but unable to get amantadine/ritalin d/t hx QT prolongation  Paroxysmal SVT - metoprolol IV 5 mg Q 6 hrs -> changed to home coreg 25 bid   Hospital day # 9  Marvel Plan, MD PhD Stroke Neurology 10/24/2019 3:42 PM  Patient condition worsened within the last 24 hours, has developed hypernatremia, AKI, hyperkalemia, continues to have lethargy, tachycardia and dysphagia, and I ordered IV fluid, encourage p.o. intake, change metoprolol IV to Coreg home meds. I spent  35 minutes in total face-to-face time with the patient, more than 50% of which was spent in counseling and coordination of care, reviewing test results, images and medication, and discussing the  diagnosis, treatment plan and potential prognosis. This patient's care requiresreview of multiple databases, neurological assessment, discussion with family, other specialists and medical decision making of high complexity.   To contact Stroke Continuity provider, please refer to [email or web address]. After hours, contact General Neurology

## 2019-10-24 NOTE — Significant Event (Signed)
Rapid Response Event Note  Overview: Please see during rounds d/t recent tx from ICU. Pt very difficult to arouse.  Initial Focused Assessment: Pt laying in bed with eyes closed. Pt would only response to deep painful stimuli. At first, pt wouldn't open eyes, follow commands, or speak, just grimace to pain. Skin warm and dry. T-100.9, HR-105, BP-144/110, RR-20, SpO2-100% RA, CBG-354. After several minutes of repeated stimulation, pt would open eyes, answer some simple questions, follow commands, and move all extremities. Word salad also present.   Interventions: No RRT interventions Plan of Care (if not transferred): Once awake, pt neuro exam consistent with previous exams. Continue to monitor pt. Call RRT if further assistance needed.  Event Summary:  Dr. Amada Jupiter notified by bedside RN  Called: Pt seen during rounds at 2115 Arrived: 2115 Ended: 2150  Terrilyn Saver

## 2019-10-25 ENCOUNTER — Other Ambulatory Visit: Payer: Self-pay

## 2019-10-25 DIAGNOSIS — I610 Nontraumatic intracerebral hemorrhage in hemisphere, subcortical: Secondary | ICD-10-CM | POA: Diagnosis not present

## 2019-10-25 DIAGNOSIS — E860 Dehydration: Secondary | ICD-10-CM | POA: Diagnosis not present

## 2019-10-25 DIAGNOSIS — J9601 Acute respiratory failure with hypoxia: Secondary | ICD-10-CM | POA: Diagnosis not present

## 2019-10-25 DIAGNOSIS — R4182 Altered mental status, unspecified: Secondary | ICD-10-CM | POA: Diagnosis not present

## 2019-10-25 DIAGNOSIS — I615 Nontraumatic intracerebral hemorrhage, intraventricular: Secondary | ICD-10-CM | POA: Diagnosis not present

## 2019-10-25 DIAGNOSIS — E876 Hypokalemia: Secondary | ICD-10-CM | POA: Diagnosis not present

## 2019-10-25 LAB — CBC
HCT: 43.8 % (ref 36.0–46.0)
Hemoglobin: 13.4 g/dL (ref 12.0–15.0)
MCH: 26.7 pg (ref 26.0–34.0)
MCHC: 30.6 g/dL (ref 30.0–36.0)
MCV: 87.3 fL (ref 80.0–100.0)
Platelets: 314 10*3/uL (ref 150–400)
RBC: 5.02 MIL/uL (ref 3.87–5.11)
RDW: 14.6 % (ref 11.5–15.5)
WBC: 15.5 10*3/uL — ABNORMAL HIGH (ref 4.0–10.5)
nRBC: 0 % (ref 0.0–0.2)

## 2019-10-25 LAB — BASIC METABOLIC PANEL
Anion gap: 10 (ref 5–15)
Anion gap: 11 (ref 5–15)
BUN: 42 mg/dL — ABNORMAL HIGH (ref 6–20)
BUN: 36 mg/dL — ABNORMAL HIGH (ref 6–20)
CO2: 24 mmol/L (ref 22–32)
CO2: 20 mmol/L — ABNORMAL LOW (ref 22–32)
Calcium: 8.8 mg/dL — ABNORMAL LOW (ref 8.9–10.3)
Calcium: 8.5 mg/dL — ABNORMAL LOW (ref 8.9–10.3)
Chloride: 120 mmol/L — ABNORMAL HIGH (ref 98–111)
Chloride: 117 mmol/L — ABNORMAL HIGH (ref 98–111)
Creatinine, Ser: 1.12 mg/dL — ABNORMAL HIGH (ref 0.44–1.00)
Creatinine, Ser: 1.03 mg/dL — ABNORMAL HIGH (ref 0.44–1.00)
GFR calc Af Amer: >60 mL/min (ref >60)
GFR calc Af Amer: >60 mL/min (ref >60)
GFR calc non Af Amer: 56 mL/min — ABNORMAL LOW (ref >60)
GFR calc non Af Amer: >60 mL/min (ref >60)
Glucose, Bld: 113 mg/dL — ABNORMAL HIGH (ref 70–99)
Glucose, Bld: 331 mg/dL — ABNORMAL HIGH (ref 70–99)
Potassium: 3.3 mmol/L — ABNORMAL LOW (ref 3.5–5.1)
Potassium: 4 mmol/L (ref 3.5–5.1)
Sodium: 154 mmol/L — ABNORMAL HIGH (ref 135–145)
Sodium: 148 mmol/L — ABNORMAL HIGH (ref 135–145)

## 2019-10-25 LAB — GLUCOSE, CAPILLARY
Glucose-Capillary: 96 mg/dL (ref 70–99)
Glucose-Capillary: 107 mg/dL — ABNORMAL HIGH (ref 70–99)
Glucose-Capillary: 330 mg/dL — ABNORMAL HIGH (ref 70–99)
Glucose-Capillary: 160 mg/dL — ABNORMAL HIGH (ref 70–99)
Glucose-Capillary: 252 mg/dL — ABNORMAL HIGH (ref 70–99)

## 2019-10-25 MED ORDER — INSULIN ASPART 100 UNIT/ML ~~LOC~~ SOLN
0.0000 [IU] | Freq: Three times a day (TID) | SUBCUTANEOUS | Status: DC
  Administered 2019-10-25: 8 [IU] via SUBCUTANEOUS

## 2019-10-25 MED ORDER — INSULIN ASPART 100 UNIT/ML ~~LOC~~ SOLN
0.0000 [IU] | Freq: Three times a day (TID) | SUBCUTANEOUS | Status: DC
  Administered 2019-10-26: 5 [IU] via SUBCUTANEOUS
  Administered 2019-10-26 (×2): 3 [IU] via SUBCUTANEOUS
  Administered 2019-10-27: 2 [IU] via SUBCUTANEOUS
  Administered 2019-10-27: 5 [IU] via SUBCUTANEOUS
  Administered 2019-10-27 – 2019-10-28 (×2): 3 [IU] via SUBCUTANEOUS
  Administered 2019-10-28 – 2019-10-29 (×2): 5 [IU] via SUBCUTANEOUS
  Administered 2019-10-29: 2 [IU] via SUBCUTANEOUS
  Administered 2019-10-29: 5 [IU] via SUBCUTANEOUS
  Administered 2019-10-30: 2 [IU] via SUBCUTANEOUS
  Administered 2019-10-30: 5 [IU] via SUBCUTANEOUS
  Administered 2019-10-30: 2 [IU] via SUBCUTANEOUS
  Administered 2019-10-31: 3 [IU] via SUBCUTANEOUS
  Administered 2019-10-31: 5 [IU] via SUBCUTANEOUS

## 2019-10-25 MED ORDER — SODIUM CHLORIDE 0.45 % IV SOLN: INTRAVENOUS | Status: DC

## 2019-10-25 MED ORDER — POTASSIUM CHLORIDE 20 MEQ PO PACK
40.0000 meq | PACK | ORAL | Status: AC
  Administered 2019-10-25 (×2): 40 meq via ORAL
  Filled 2019-10-25 (×2): qty 2

## 2019-10-25 NOTE — Progress Notes (Signed)
PROGRESS NOTE    Latoya Cox  ZOX:[id number] DOB: [birth year] DOA: 10/15/2019 PCP: Myrlene Broker, MD   Brief Narrative:  Per admitting MD: HPI: Latoya Cox is an 54 y.o. female with DM, HTN and arthritis, [name] and depressed level of consciousness with garbled speech at home, after complaining of a headache earlier in the day. Husband told EMS that the patient had been complaining of a headache since the morning, but was with no deficits at that time. At 4:30 PM, she continued to complain about her headache and she also felt sleepy, so she took a nap. Her husband checked on her at 6:30 PM, noted the right sided weakness and depressed level of consciousness, and called EMS. On EMS arrival, they noted the same, as well as right facial droop. She vomited en route and continued to vomit intermittently on arrival to the ED. She was obtunded to somnolent and unable to answer questions intelligibly. STAT CT head was obtained, revealing an ICH originating from the left basal ganglila, extending into the left lateral, third and 4th ventricles, with a small amount of blood in the right lateral ventricle as well. Early stages of hydrocephalus were also noted on CT  Hosp Course by e-record Admitted to ICU by Neuro. Seen by NSU-EVD placed Remained in ICU on vent support Weaned and transferred out to Neuro floor with neuro Transferred care to [facility] Hosp day 9 on 6/27 2/2 concerns for Hypernatremia 154, AKI 42/1.1 and hyperkalemia - resolved at 3.3    Assessment & Plan:   Active Problems:   ICH (intracerebral hemorrhage) (HCC)   Acute hypoxemic respiratory failure (HCC)   Altered mental status  ASSESSMENT/PLAN [name] is a 54 y.o. femalewith DM, HTN, previous hx of aneurysm repair and arthritis,presenting with acute onset of right sided weakness and depressed level of consciousness with garbled speech after complaining of  a headacheearlier in the day.   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 Intraventricular penetration. Early dilatation of the lateral ventricles.   CT head - No significant interval change in size and morphology of acute intraparenchymal hemorrhage emanating from the left thalamus, estimated volume 11 CC. 5 mm MLS. IVH similar to previous. Associated obstructive hydrocephalus appears slightly worsened from previous.   MRI head - not able to perform due to previous aneurysm clips  CTA Head 6/19 - No significant residual or recurrent aneurysm.   CT Head 6/24 - Interval improvement in left BGa hematoma. Resolution of IVH.No hydrocephalus.  Carotid Doppler - unremarkable  2D Echo - EF 60 - 65%. No cardiac source of emboli identified.  Sars Corona Virus 2  - negative  LDL - 186  HgbA1c - 7.7  UDS - benzodiazepine  VTE prophylaxis - Lovenox 40 mg sq daily   No antithrombotic prior to admission, now on No antithrombotic  Therapy recommendations:  CIR   Disposition:  Pending  Obstructive hydrocephalus s/p EVD  CT repeat showed worsening obstructive hydrocephalus  Neurosurgery on board  Status post EVD [name]  CT Head 6/24 - Interval improvement in left BGa hematoma. Resolution of IVH.No hydrocephalus.   EVD self removed 6/24  Acute Respiratory Failure  Intubated  Prn precedex, fentanyl  CCM on board  Self extubation 6/24 - tolerating well  Hypernatremia and AKI  Na 155->154  Creatinine 0.92-1.15-0.98-1.30-1.12  c/w  1/2 NS @ 75, repeat bmp pending  Encourage po intake  History of aneurysm  As per husband, it was many years ago  Status post aneurysm clip  Clip not compatible with MRI due to no detailed information from husband  CTA head 6/19 - No significant residual or recurrent aneurysm.   Hypertensive emergency  Home BP meds: Norvasc ; Coreg ;  Apresoline  Current BP meds: home meds resumed  Treated with cleviprex, now off  Long-term BP goal normotensive  Hyperlipidemia  Home Lipid lowering medication: none   LDL 186, goal < 70  No statin for now due to ICH   Consider statin at discharge  Diabetes type II, uncontrolled  Home diabetic meds: Jardiance ; Actos  Current diabetic meds: levemir  SSI  CBG monitoring  HgbA1c 7.7, goal < 7.0  Close PCP follow up  Dysphagia  Due to intubation the stroke  NPO -> tube feeding-> dysphagia 2 diet nectar thick liquids  As above 1/2 NS at 75   Speech on board  Other Stroke Risk Factors  Advanced age  Obesity, s/p bariatric sugery, recommend weight loss, diet and exercise as appropriate   Family hx stroke (mother)   Hx of cardiomyopathy  History aneurysm s/p repair  obstructive sleep apnea per pt report  Other Active Problems  WBCs - 9.9->24K->17.9-16.4-14.9-13.3-15.5  Hypokalemia 3.8->3.4 ->5.6->4.6>3.3  Lethargic but unable to get amantadine/ritalin d/t hx QT prolongation  Paroxysmal SVT - metoprolol IV 5 mg Q 6 hrs -> changed to home coreg 25 bid   DVT prophylaxis: Lovenox SQ  Code Status: full Code Status History    Date Active Date Inactive Code Status Order ID Comments User Context   01/14/2019 1747 01/17/2019 1940 Full Code [id number]  Teddy Spike, DO Inpatient   Advance Care Planning Activity    Questions for Most Recent Historical Code Status (Order [id number])      Family Communication: discussed with husband on 6/27  Disposition Plan:    Status is: Inpatient  Remains inpatient appropriate because:Persistent severe electrolyte disturbances   Dispo: The patient is from: Home              Anticipated d/c is to: CIR              Anticipated d/c date is: 3 days              Patient currently is not medically stable to d/c.       Consults called: pccm, nsu., neuro Admission status: Inpatient   Consultants:   as  above  Procedures:  CT ANGIO HEAD W OR WO CONTRAST  Result Date: 10/17/2019 CLINICAL DATA:  Intraparenchymal hemorrhage. EXAM: CT ANGIOGRAPHY HEAD TECHNIQUE: Multidetector CT imaging of the head was performed using the standard protocol during bolus administration of intravenous contrast. Multiplanar CT image reconstructions and MIPs were obtained to evaluate the vascular anatomy. CONTRAST:  75mL OMNIPAQUE IOHEXOL 350 MG/ML SOLN COMPARISON:  CT head without contrast 10/16/2019 at 1:48 a.m. FINDINGS: CT HEAD Brain: Hemorrhage centered in the left thalamus is stable in size. Intraventricular blood has increased. Left frontal ventriculostomy catheter is in place. Midline shift is slightly more prominent. Blood is seen in the third and fourth ventricles. No new parenchymal hemorrhage is present. Vascular: Aneurysm clip is again noted. Minimal vascular calcifications are present. No hyperdense vessel is evident. Skull: Craniotomy is noted. Calvarium is otherwise within normal limits. Sinuses: The paranasal sinuses and mastoid air cells are clear. Orbits: The globes and orbits  are within normal limits. CTA HEAD Anterior circulation: Atherosclerotic changes are noted within the cavernous internal carotid arteries. No significant stenosis is present. Aneurysm clip is in place. No significant residual recurrent aneurysm is present. The A1 and M1 segments are normal. The anterior communicating artery is patent. MCA bifurcations are intact. ACA and MCA branch vessels are unremarkable. Posterior circulation: The vertebral arteries are codominant. PICA origins are visualized and normal. The basilar artery is normal. Both posterior cerebral arteries originate from basilar tip. The PCA branch vessels are within normal limits. Venous sinuses: The dural sinuses are patent. The straight sinus and deep cerebral veins patent. Cortical veins are unremarkable. Anatomic variants: None IMPRESSION: 1. Stable size of left thalamic  hemorrhage. 2. Increased intraventricular blood. With slight increase in midline shift 3. Left frontal ventriculostomy catheter is in place. 4. No significant residual or recurrent aneurysm. 5. No significant proximal stenosis, aneurysm, or branch vessel occlusion within the Circle of Willis. Normal CTA of the head. No focal etiology for the hemorrhage. Electronically Signed   By: Marin Roberts M.D.   On: 10/17/2019 07:13   CT HEAD WO CONTRAST  Result Date: 10/22/2019 CLINICAL DATA:  Intracranial hemorrhage.  Encephalopathy EXAM: CT HEAD WITHOUT CONTRAST TECHNIQUE: Contiguous axial images were obtained from the base of the skull through the vertex without intravenous contrast. COMPARISON:  CT head 10/17/2019 FINDINGS: Brain: Left medial basal ganglia and thalamic hemorrhage shows interval improvement. No new hemorrhage. No intraventricular hemorrhage is present on today's study. Left ventricular drainage catheter is been removed. No hydrocephalus. Mild midline shift to the right due to the hematoma. Small subdural hygromas along the tentorium bilaterally unchanged. Negative for acute infarct or mass. Vascular: Negative for hyperdense vessel. Aneurysm clip left ophthalmic artery region Skull: Left pterional craniotomy.  No acute skeletal abnormality. Sinuses/Orbits: Mild mucosal edema paranasal sinuses. Negative orbit. Other: None IMPRESSION: Interval improvement in left basal ganglia hematoma. Resolution of intraventricular hemorrhage. Left ventricular drain has been removed.  No hydrocephalus. Electronically Signed   By: Marlan Palau M.D.   On: 10/22/2019 21:06   CT HEAD WO CONTRAST  Result Date: 10/16/2019 CLINICAL DATA:  Follow-up examination for intracranial hemorrhage. EXAM: CT HEAD WITHOUT CONTRAST TECHNIQUE: Contiguous axial images were obtained from the base of the skull through the vertex without intravenous contrast. COMPARISON:  Prior CT from 10/15/2019. FINDINGS: Brain: Acute  intraparenchymal hemorrhage emanating from the left thalamus again seen, not significantly changed in size and morphology as compared to previous exam. This measures 3.4 x 3.1 x 2.0 cm (estimated volume 11 cc). Mildly increased localized edema with trace 5 mm localized left-to-right shift at the septum pellucidum. Associated intraventricular extension with blood seen throughout the ventricular system. Degree of intraventricular blood is similar. Associated obstructive hydrocephalus appears slightly worsened from previous. No other acute intracranial hemorrhage. No acute large vessel territory infarct. No extra-axial fluid collection or visible mass lesion. Vascular: No hyperdense vessel. Aneurysm clip position near the left ICA terminus again noted. Skull: No scalp soft tissue abnormality. Prior left frontal craniotomy. Sinuses/Orbits: Globes and orbital soft tissues within normal limits. Paranasal sinuses and mastoid air cells remain clear. Other: None. IMPRESSION: 1. No significant interval change in size and morphology of acute intraparenchymal hemorrhage emanating from the left thalamus, estimated volume 11 CC. Mildly increased localized edema with trace 5 mm localized left-to-right shift at the septum pellucidum. 2. Associated intraventricular extension with blood throughout the ventricular system, similar to previous. Associated obstructive hydrocephalus appears slightly worsened from previous. 3.  No other new acute intracranial abnormality. Electronically Signed   By: Rise Mu M.D.   On: 10/16/2019 02:11   DG CHEST PORT 1 VIEW  Result Date: 10/24/2019 CLINICAL DATA:  Stroke EXAM: PORTABLE CHEST 1 VIEW COMPARISON:  Radiograph 10/20/2019 FINDINGS: Persistent bandlike opacities in right lung base favoring subsegmental atelectatic change. Additional hazy areas of basilar atelectasis bilaterally. No focal consolidation, pneumothorax, effusion or convincing features of edema. Interval removal of the  transesophageal endotracheal tubes. Telemetry leads remain over the chest. High attenuation contrast material is noted in the colon at the level of the splenic flexure. Soft tissues and osseous structures are otherwise unremarkable. IMPRESSION: 1. Interval removal of the transesophageal and endotracheal tubes. 2. Bibasilar atelectasis including more subsegmental atelectasis in the right lung base. Electronically Signed   By: Kreg Shropshire M.D.   On: 10/24/2019 23:07   DG CHEST PORT 1 VIEW  Result Date: 10/20/2019 CLINICAL DATA:  Intubated. EXAM: PORTABLE CHEST 1 VIEW COMPARISON:  Chest x-ray dated October 18, 2019. FINDINGS: Unchanged endotracheal and enteric tubes. Stable cardiomediastinal silhouette. Normal pulmonary vascularity. Low lung volumes with mild bibasilar atelectasis. No focal consolidation, pleural effusion, or pneumothorax. No acute osseous abnormality. IMPRESSION: 1. Stable support tubes.  No active disease. Electronically Signed   By: Obie Dredge M.D.   On: 10/20/2019 11:55   DG Chest Port 1 View  Result Date: 10/18/2019 CLINICAL DATA:  Respiratory failure.  Evaluate pneumo. EXAM: PORTABLE CHEST 1 VIEW COMPARISON:  October 16, 2019 FINDINGS: The ETT is in good position. The NG tube terminates below today's film. No pneumothorax. The lungs are clear. Stable cardiomegaly. The hila and mediastinum are unchanged. IMPRESSION: 1. Support apparatus as above. 2. No other acute abnormalities. Electronically Signed   By: Gerome Sam III M.D   On: 10/18/2019 12:05   DG CHEST PORT 1 VIEW  Result Date: 10/16/2019 CLINICAL DATA:  ET tube and OG tube placed EXAM: PORTABLE CHEST 1 VIEW COMPARISON:  June 29, 2016 FINDINGS: The heart size and mediastinal contours are within normal limits. Probable subsegmental atelectasis seen at the right lung base. ETT is 2.8 cm above the carina. NG tube is seen below the diaphragm within the stomach. The visualized skeletal structures are unremarkable. IMPRESSION: ET  tube and NG tube in satisfactory position. Subsegmental atelectasis at the right lung base. Electronically Signed   By: Jonna Clark M.D.   On: 10/16/2019 06:55   DG Swallowing Func-Speech Pathology  Result Date: 10/23/2019 Objective Swallowing Evaluation: Type of Study: MBS-Modified Barium Swallow Study  Patient Details Name: Antonieta Slaven MRN: [id number] Date of Birth: 08-24-1965 Today's Date: 10/23/2019 Time: SLP Start Time (ACUTE ONLY): 1339 -SLP Stop Time (ACUTE ONLY): 1357 SLP Time Calculation (min) (ACUTE ONLY): 18 min Past Medical History: Past Medical History: Diagnosis Date . Arthritis  . Chest wall pain  . Diabetes mellitus without complication (HCC)  . Hypertension  Past Surgical History: Past Surgical History: Procedure Laterality Date . aneurism repair   . lapband   HPI: Pt with acute headache and slurred speech, brought by EMS to hospital. CT head was obtained, revealing an ICH originating from the left basal ganglila, extending into the left lateral, third and 4th ventricles, with a small amount of blood in the right lateral ventricle as well. Early stages of hydrocephalus were also noted on CT. Acute hypoxia from respiratory failure, requiring intubation on 10/16/19. Extubated 10/21/19.  Subjective: alert, cooperative, needs cues Assessment / Plan / Recommendation CHL IP CLINICAL IMPRESSIONS 10/23/2019 Clinical  Impression Pt has a mild oropharyngeal dysphagia. Orally, she has premature spillage of thin liquids and mildly prolonged mastication and transit of solids. Her pharyngeal phase is relatively more functional, but when drinking consecutively via cup or straw, there is trace, silent aspiration of thin liquids that spilled prematurely into the pyriform sinuses. No aspiration is observed with any other consistency. Also considering her mentation, recommend starting with Dys 2 (chopped) diet and nectar thick liquids with good potential to progress given additional time post-extubation and for  cognitive therapy.  SLP Visit Diagnosis Dysphagia, oropharyngeal phase (R13.12) Attention and concentration deficit following -- Frontal lobe and executive function deficit following -- Impact on safety and function Mild aspiration risk   CHL IP TREATMENT RECOMMENDATION 10/23/2019 Treatment Recommendations Therapy as outlined in treatment plan below   Prognosis 10/23/2019 Prognosis for Safe Diet Advancement Good Barriers to Reach Goals Cognitive deficits Barriers/Prognosis Comment -- CHL IP DIET RECOMMENDATION 10/23/2019 SLP Diet Recommendations Dysphagia 2 (Fine chop) solids;Nectar thick liquid Liquid Administration via Cup;Straw Medication Administration Crushed with puree Compensations Minimize environmental distractions Postural Changes Seated upright at 90 degrees   CHL IP OTHER RECOMMENDATIONS 10/23/2019 Recommended Consults -- Oral Care Recommendations Oral care BID Other Recommendations Order thickener from pharmacy;Prohibited food (jello, ice cream, thin soups);Remove water pitcher   CHL IP FOLLOW UP RECOMMENDATIONS 10/23/2019 Follow up Recommendations Inpatient Rehab   CHL IP FREQUENCY AND DURATION 10/23/2019 Speech Therapy Frequency (ACUTE ONLY) min 2x/week Treatment Duration 2 weeks      CHL IP ORAL PHASE 10/23/2019 Oral Phase Impaired Oral - Pudding Teaspoon -- Oral - Pudding Cup -- Oral - Honey Teaspoon -- Oral - Honey Cup -- Oral - Nectar Teaspoon -- Oral - Nectar Cup WFL Oral - Nectar Straw WFL Oral - Thin Teaspoon -- Oral - Thin Cup Premature spillage Oral - Thin Straw Premature spillage Oral - Puree WFL Oral - Mech Soft Impaired mastication;Delayed oral transit Oral - Regular -- Oral - Multi-Consistency -- Oral - Pill -- Oral Phase - Comment --  CHL IP PHARYNGEAL PHASE 10/23/2019 Pharyngeal Phase Impaired Pharyngeal- Pudding Teaspoon -- Pharyngeal -- Pharyngeal- Pudding Cup -- Pharyngeal -- Pharyngeal- Honey Teaspoon -- Pharyngeal -- Pharyngeal- Honey Cup -- Pharyngeal -- Pharyngeal- Nectar Teaspoon --  Pharyngeal -- Pharyngeal- Nectar Cup WFL Pharyngeal -- Pharyngeal- Nectar Straw WFL Pharyngeal -- Pharyngeal- Thin Teaspoon -- Pharyngeal -- Pharyngeal- Thin Cup Reduced airway/laryngeal closure;Penetration/Aspiration during swallow Pharyngeal Material enters airway, passes BELOW cords without attempt by patient to eject out (silent aspiration) Pharyngeal- Thin Straw Reduced airway/laryngeal closure;Penetration/Aspiration during swallow Pharyngeal Material enters airway, passes BELOW cords without attempt by patient to eject out (silent aspiration) Pharyngeal- Puree WFL Pharyngeal -- Pharyngeal- Mechanical Soft WFL Pharyngeal -- Pharyngeal- Regular -- Pharyngeal -- Pharyngeal- Multi-consistency -- Pharyngeal -- Pharyngeal- Pill -- Pharyngeal -- Pharyngeal Comment --  CHL IP CERVICAL ESOPHAGEAL PHASE 10/23/2019 Cervical Esophageal Phase WFL Pudding Teaspoon -- Pudding Cup -- Honey Teaspoon -- Honey Cup -- Nectar Teaspoon -- Nectar Cup -- Nectar Straw -- Thin Teaspoon -- Thin Cup -- Thin Straw -- Puree -- Mechanical Soft -- Regular -- Multi-consistency -- Pill -- Cervical Esophageal Comment -- Mahala Menghini., M.A. CCC-SLP Acute Rehabilitation Services Pager [phone] Office [phone] 10/23/2019, 3:28 PM              ECHOCARDIOGRAM COMPLETE  Result Date: 10/16/2019    ECHOCARDIOGRAM REPORT   Patient Name:   BREEZY Roesler Date of Exam: 10/16/2019 Medical Rec #:  [id number]     Height:  62.0 in Accession #:    [id number]    Weight:       172.0 lb Date of Birth:  November 30, 1965      BSA:          1.793 m Patient Age:    34 years      BP:           128/62 mmHg Patient Gender: F             HR:           99 bpm. Exam Location:  Inpatient Procedure: 2D Echo, Color Doppler, Cardiac Doppler and Intracardiac            Opacification Agent Indications:    Stroke i163.9  History:        Patient has no prior history of Echocardiogram examinations.                 Risk Factors:Hypertension and Diabetes.  Sonographer:    Raquel Sarna  Senior RDCS Referring Phys: [id number] Rosalin Hawking  Sonographer Comments: Echo performed with patient supine and on artificial respirator. IMPRESSIONS  1. Left ventricular ejection fraction, by estimation, is 60 to 65%. The left ventricle has normal function. The left ventricle has no regional wall motion abnormalities. There is mild concentric left ventricular hypertrophy. Left ventricular diastolic parameters are consistent with Grade I diastolic dysfunction (impaired relaxation).  2. Right ventricular systolic function is normal. The right ventricular size is normal.  3. The mitral valve is normal in structure. No evidence of mitral valve regurgitation. No evidence of mitral stenosis.  4. The aortic valve is normal in structure. Aortic valve regurgitation is not visualized. No aortic stenosis is present.  5. Aortic dilatation noted. There is borderline dilatation of the aortic root.  6. The inferior vena cava is normal in size with greater than 50% respiratory variability, suggesting right atrial pressure of 3 mmHg. FINDINGS  Left Ventricle: Left ventricular ejection fraction, by estimation, is 60 to 65%. The left ventricle has normal function. The left ventricle has no regional wall motion abnormalities. Definity contrast agent was given IV to delineate the left ventricular  endocardial borders. The left ventricular internal cavity size was normal in size. There is mild concentric left ventricular hypertrophy. Left ventricular diastolic parameters are consistent with Grade I diastolic dysfunction (impaired relaxation). Right Ventricle: The right ventricular size is normal. No increase in right ventricular wall thickness. Right ventricular systolic function is normal. Left Atrium: Left atrial size was normal in size. Right Atrium: Right atrial size was normal in size. Pericardium: There is no evidence of pericardial effusion. Mitral Valve: The mitral valve is normal in structure. Normal mobility of the mitral valve  leaflets. No evidence of mitral valve regurgitation. No evidence of mitral valve stenosis. Tricuspid Valve: The tricuspid valve is normal in structure. Tricuspid valve regurgitation is not demonstrated. No evidence of tricuspid stenosis. Aortic Valve: The aortic valve is normal in structure. Aortic valve regurgitation is not visualized. No aortic stenosis is present. Pulmonic Valve: The pulmonic valve was normal in structure. Pulmonic valve regurgitation is not visualized. No evidence of pulmonic stenosis. Aorta: Aortic dilatation noted. There is borderline dilatation of the aortic root. Venous: The inferior vena cava is normal in size with greater than 50% respiratory variability, suggesting right atrial pressure of 3 mmHg. IAS/Shunts: No atrial level shunt detected by color flow Doppler.  LEFT VENTRICLE PLAX 2D LVIDd:         4.40 cm LVIDs:  2.90 cm LV PW:         1.30 cm LV IVS:        1.20 cm LVOT diam:     2.20 cm LV SV:         49 LV SV Index:   27 LVOT Area:     3.80 cm  RIGHT VENTRICLE RV S prime:     18.30 cm/s LEFT ATRIUM             Index       RIGHT ATRIUM           Index LA diam:        2.90 cm 1.62 cm/m  RA Area:     14.90 cm LA Vol (A2C):   57.1 ml 31.85 ml/m RA Volume:   36.90 ml  20.58 ml/m LA Vol (A4C):   68.0 ml 37.93 ml/m LA Biplane Vol: 65.0 ml 36.25 ml/m  AORTIC VALVE LVOT Vmax:   89.23 cm/s LVOT Vmean:  58.733 cm/s LVOT VTI:    0.128 m  AORTA Ao Root diam: 3.90 cm Ao Asc diam:  3.40 cm  SHUNTS Systemic VTI:  0.13 m Systemic Diam: 2.20 cm Rachelle Hora Croitoru MD Electronically signed by Thurmon Fair MD Signature Date/Time: 10/16/2019/11:50:40 AM    Final    CT HEAD CODE STROKE WO CONTRAST  Result Date: 10/15/2019 CLINICAL DATA:  Code stroke.  Altered mental status.  Headache. EXAM: CT HEAD WITHOUT CONTRAST TECHNIQUE: Contiguous axial images were obtained from the base of the skull through the vertex without intravenous contrast. COMPARISON:  Head CT 08/26/2018 FINDINGS: Brain:  There is acute parenchymal hemorrhage with the epicenter in the left thalamus. The hematoma measures 3.2 x 2.3 x 2.5 cm (volume = 9.6 cm^3). There is intraventricular penetration with blood filling the third ventricle and nearly filling the fourth ventricle. Small amount in the frontal horns of the lateral ventricles. No surrounding edema at this time. Lateral ventricles are dilated compared to the study of April 2020. Elsewhere, there chronic small-vessel ischemic changes of white matter. No large vessel territory stroke. No sign of mass. No extra-axial collection. 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 Skull: Previous left pterional craniotomy. Sinuses/Orbits: Clear/normal Other: None ASPECTS ([place] Stroke Program Early CT Score) - Ganglionic level infarction (caudate, lentiform nuclei, internal capsule, insula, M1-M3 cortex): 7 - Supraganglionic infarction (M4-M6 cortex): 3 Total score (0-10 with 10 being normal): 10 IMPRESSION: 1. Acute intraparenchymal hemorrhage in the left thalamus, 9.6 cc. Intraventricular penetration. Early dilatation of the lateral ventricles. 2. ASPECTS is 10 3. These results were communicated to Dr. Otelia Limes at 7:38 pmon 6/17/2021by text page via the [facility] messaging system. Electronically Signed   By: Paulina Fusi M.D.   On: 10/15/2019 19:40   VAS US CAROTID  Result Date: 10/19/2019 Carotid Arterial Duplex Study Indications:       CVA. Risk Factors:      Hypertension, Diabetes. Limitations        Today's exam was limited due to patient positioning. Comparison Study:  no prior Performing Technologist: Blanch Media RVS  Examination Guidelines: A complete evaluation includes B-mode imaging, spectral Doppler, color Doppler, and power Doppler as needed of all accessible portions of each vessel. Bilateral testing is considered an integral part of a complete examination. Limited examinations for reoccurring indications may be performed as noted.  Right  Carotid Findings: +----------+--------+--------+--------+------------------+--------+           PSV cm/sEDV cm/sStenosisPlaque DescriptionComments +----------+--------+--------+--------+------------------+--------+  CCA Distal51      14                                         +----------+--------+--------+--------+------------------+--------+ ICA Prox  52      12      1-39%   heterogenous               +----------+--------+--------+--------+------------------+--------+ ECA       28      7                                          +----------+--------+--------+--------+------------------+--------+ +----------+--------+-------+--------+-------------------+           PSV cm/sEDV cmsDescribeArm Pressure (mmHG) +----------+--------+-------+--------+-------------------+ [id number]                                         +----------+--------+-------+--------+-------------------+ +---------+--------+--------+--------------+ VertebralPSV cm/sEDV cm/sNot identified +---------+--------+--------+--------------+  Left Carotid Findings: +----------+--------+--------+--------+------------------+--------+           PSV cm/sEDV cm/sStenosisPlaque DescriptionComments +----------+--------+--------+--------+------------------+--------+ CCA Prox  83      12              heterogenous               +----------+--------+--------+--------+------------------+--------+ CCA Distal54      13              heterogenous               +----------+--------+--------+--------+------------------+--------+ ICA Prox  49      15      1-39%   heterogenous               +----------+--------+--------+--------+------------------+--------+ ICA Distal50      17                                         +----------+--------+--------+--------+------------------+--------+ ECA       60      8                                           +----------+--------+--------+--------+------------------+--------+ +----------+--------+--------+--------+-------------------+           PSV cm/sEDV cm/sDescribeArm Pressure (mmHG) +----------+--------+--------+--------+-------------------+ [id number]                                          +----------+--------+--------+--------+-------------------+ +---------+--------+--+--------+--+---------+ VertebralPSV cm/s44EDV cm/s11Antegrade +---------+--------+--+--------+--+---------+   Summary: Right Carotid: Velocities in the right ICA are consistent with a 1-39% stenosis. Left Carotid: Velocities in the left ICA are consistent with a 1-39% stenosis. Vertebrals: Left vertebral artery demonstrates antegrade flow. Right vertebral             artery was not visualized. *See table(s) above for measurements and observations.  Electronically signed by Delia Heady MD on 10/19/2019 at 1:33:17 PM.    Final      Antimicrobials:   none   Subjective: No acute changes ovenright, seen and  examined bedside, poor insight but pleasant and cooperative  Objective: Vitals:   10/25/19 0600 10/25/19 0731 10/25/19 0800 10/25/19 1125  BP:  (!) 139/104  128/90  Pulse:  96  98  Resp: 15 16 15 16   Temp:  97.6 F (36.4 C)  98.3 F (36.8 C)  TempSrc:  Oral  Oral  SpO2:  100%  98%  Weight:        Intake/Output Summary (Last 24 hours) at 10/25/2019 1227 Last data filed at 10/25/2019 0900 Gross per 24 hour  Intake 1440.77 ml  Output 650 ml  Net 790.77 ml   Filed Weights   10/23/19 0500  Weight: 69.8 kg    Examination:  General exam: Appears calm Respiratory system: Clear to auscultation. Respiratory effort normal. Cardiovascular system: S1 & S2 heard, RRR. No JVD, murmurs, rubs, gallops or clicks. No pedal edema. Gastrointestinal system: Abdomen is nondistended, soft and nontender. No organomegaly or masses felt. Normal bowel sounds heard. Central nervous system: alert this am, mod  perseveration, LUE 4/5 LLE3+/5, RUE 3/5, RLE 3/56 Extremities: AS above Skin: No rashes, lesions or ulcers Psychiatry: flat affect, poor insight.     Data Reviewed: I have personally reviewed following labs and imaging studies  CBC: Recent Labs  Lab 10/19/19 0457 10/20/19 0451 10/25/19 0257  WBC 14.9* 13.3* 15.5*  HGB 13.0 11.9* 13.4  HCT 41.9 39.2 43.8  MCV 87.7 86.9 87.3  PLT 198 216 314   Basic Metabolic Panel: Recent Labs  Lab 10/19/19 0457 10/19/19 0457 10/19/19 2158 10/19/19 2158 10/20/19 0451 10/21/19 0609 10/22/19 2121 10/24/19 0337 10/24/19 1255 10/25/19 0257  NA 149*   < > 149*   < > 150*  --  153* 155* 154* 154*  K 3.4*   < > 4.1   < > 3.8  --  3.4* 5.6* 4.6 3.3*  CL 115*   < > 116*   < > 116*  --  117* 120* 118* 120*  CO2 23   < > 24   < > 24  --  22 19* 23 24  GLUCOSE 122*   < > 103*   < > 146*  --  166* 162* 237* 113*  BUN 40*   < > 38*   < > 41*  --  51* 46* 50* 42*  CREATININE 0.94   < > 0.81   < > 0.92  --  1.15* 0.98 1.30* 1.12*  CALCIUM 9.2   < > 9.2   < > 9.0  --  9.3 9.2 9.2 8.8*  MG 2.4  --  2.4  --  2.5* 2.5* 2.9*  --   --   --   PHOS 4.1  --   --   --  4.8* 4.0  --   --   --   --    < > = values in this interval not displayed.   GFR: Estimated Creatinine Clearance: 53.8 mL/min (A) (by C-G formula based on SCr of 1.12 mg/dL (H)). Liver Function Tests: No results for input(s): AST, ALT, ALKPHOS, BILITOT, PROT, ALBUMIN in the last 168 hours. No results for input(s): LIPASE, AMYLASE in the last 168 hours. No results for input(s): AMMONIA in the last 168 hours. Coagulation Profile: No results for input(s): INR, PROTIME in the last 168 hours. Cardiac Enzymes: No results for input(s): CKTOTAL, CKMB, CKMBINDEX, TROPONINI in the last 168 hours. BNP (last 3 results) No results for input(s): PROBNP in the last 8760 hours. HbA1C: No results for  input(s): HGBA1C in the last 72 hours. CBG: Recent Labs  Lab 10/24/19 2131 10/24/19 2338  10/25/19 0322 10/25/19 0743 10/25/19 1132  GLUCAP 354* 252* 96 107* 330*   Lipid Profile: No results for input(s): CHOL, HDL, LDLCALC, TRIG, CHOLHDL, LDLDIRECT in the last 72 hours. Thyroid Function Tests: No results for input(s): TSH, T4TOTAL, FREET4, T3FREE, THYROIDAB in the last 72 hours. Anemia Panel: No results for input(s): VITAMINB12, FOLATE, FERRITIN, TIBC, IRON, RETICCTPCT in the last 72 hours. Sepsis Labs: No results for input(s): PROCALCITON, LATICACIDVEN in the last 168 hours.  Recent Results (from the past 240 hour(s))  SARS Coronavirus 2 by RT PCR (hospital order, performed in [facility] hospital lab) Nasopharyngeal Nasopharyngeal Swab     Status: None   Collection Time: 10/15/19  7:59 PM   Specimen: Nasopharyngeal Swab  Result Value Ref Range Status   SARS Coronavirus 2 NEGATIVE NEGATIVE Final    Comment: (NOTE) SARS-CoV-2 target nucleic acids are NOT DETECTED.  The SARS-CoV-2 RNA is generally detectable in upper and lower respiratory specimens during the acute phase of infection. The lowest concentration of SARS-CoV-2 viral copies this assay can detect is 250 copies / mL. A negative result does not preclude SARS-CoV-2 infection and should not be used as the sole basis for treatment or other patient management decisions.  A negative result may occur with improper specimen collection / handling, submission of specimen other than nasopharyngeal swab, presence of viral mutation(s) within the areas targeted by this assay, and inadequate number of viral copies (<250 copies / mL). A negative result must be combined with clinical observations, patient history, and epidemiological information.  Fact Sheet for Patients:   BoilerBrush.com.cy  Fact Sheet for Healthcare Providers: https://pope.com/  This test is not yet approved or  cleared by the [place] FDA and has been authorized for detection and/or diagnosis of  SARS-CoV-2 by FDA under an Emergency Use Authorization (EUA).  This EUA will remain in effect (meaning this test can be used) for the duration of the COVID-19 declaration under Section 564(b)(1) of the Act, 21 U.S.C. section 360bbb-3(b)(1), unless the authorization is terminated or revoked sooner.  Performed at [facility], [address] [place], [place] [address]   MRSA PCR Screening     Status: None   Collection Time: 10/16/19  1:14 AM   Specimen: Nasopharyngeal  Result Value Ref Range Status   MRSA by PCR NEGATIVE NEGATIVE Final    Comment:        The GeneXpert MRSA Assay (FDA approved for NASAL specimens only), is one component of a comprehensive MRSA colonization surveillance program. It is not intended to diagnose MRSA infection nor to guide or monitor treatment for MRSA infections. Performed at [facility], [address] [place], [place] [address]          Radiology Studies: DG CHEST PORT 1 VIEW  Result Date: 10/24/2019 CLINICAL DATA:  Stroke EXAM: PORTABLE CHEST 1 VIEW COMPARISON:  Radiograph 10/20/2019 FINDINGS: Persistent bandlike opacities in right lung base favoring subsegmental atelectatic change. Additional hazy areas of basilar atelectasis bilaterally. No focal consolidation, pneumothorax, effusion or convincing features of edema. Interval removal of the transesophageal endotracheal tubes. Telemetry leads remain over the chest. High attenuation contrast material is noted in the colon at the level of the splenic flexure. Soft tissues and osseous structures are otherwise unremarkable. IMPRESSION: 1. Interval removal of the transesophageal and endotracheal tubes. 2. Bibasilar atelectasis including more subsegmental atelectasis in the right lung base.  Electronically Signed   By: Kreg Shropshire M.D.   On: 10/24/2019 23:07   DG Swallowing Func-Speech Pathology  Result Date: 10/23/2019 Objective Swallowing Evaluation: Type of Study: MBS-Modified Barium  Swallow Study  Patient Details Name: Mayerli Kirst MRN: [id number] Date of Birth: Aug 22, 1965 Today's Date: 10/23/2019 Time: SLP Start Time (ACUTE ONLY): 1339 -SLP Stop Time (ACUTE ONLY): 1357 SLP Time Calculation (min) (ACUTE ONLY): 18 min Past Medical History: Past Medical History: Diagnosis Date . Arthritis  . Chest wall pain  . Diabetes mellitus without complication (HCC)  . Hypertension  Past Surgical History: Past Surgical History: Procedure Laterality Date . aneurism repair   . lapband   HPI: Pt with acute headache and slurred speech, brought by EMS to hospital. CT head was obtained, revealing an ICH originating from the left basal ganglila, extending into the left lateral, third and 4th ventricles, with a small amount of blood in the right lateral ventricle as well. Early stages of hydrocephalus were also noted on CT. Acute hypoxia from respiratory failure, requiring intubation on 10/16/19. Extubated 10/21/19.  Subjective: alert, cooperative, needs cues Assessment / Plan / Recommendation CHL IP CLINICAL IMPRESSIONS 10/23/2019 Clinical Impression Pt has a mild oropharyngeal dysphagia. Orally, she has premature spillage of thin liquids and mildly prolonged mastication and transit of solids. Her pharyngeal phase is relatively more functional, but when drinking consecutively via cup or straw, there is trace, silent aspiration of thin liquids that spilled prematurely into the pyriform sinuses. No aspiration is observed with any other consistency. Also considering her mentation, recommend starting with Dys 2 (chopped) diet and nectar thick liquids with good potential to progress given additional time post-extubation and for cognitive therapy.  SLP Visit Diagnosis Dysphagia, oropharyngeal phase (R13.12) Attention and concentration deficit following -- Frontal lobe and executive function deficit following -- Impact on safety and function Mild aspiration risk   CHL IP TREATMENT RECOMMENDATION 10/23/2019 Treatment  Recommendations Therapy as outlined in treatment plan below   Prognosis 10/23/2019 Prognosis for Safe Diet Advancement Good Barriers to Reach Goals Cognitive deficits Barriers/Prognosis Comment -- CHL IP DIET RECOMMENDATION 10/23/2019 SLP Diet Recommendations Dysphagia 2 (Fine chop) solids;Nectar thick liquid Liquid Administration via Cup;Straw Medication Administration Crushed with puree Compensations Minimize environmental distractions Postural Changes Seated upright at 90 degrees   CHL IP OTHER RECOMMENDATIONS 10/23/2019 Recommended Consults -- Oral Care Recommendations Oral care BID Other Recommendations Order thickener from pharmacy;Prohibited food (jello, ice cream, thin soups);Remove water pitcher   CHL IP FOLLOW UP RECOMMENDATIONS 10/23/2019 Follow up Recommendations Inpatient Rehab   CHL IP FREQUENCY AND DURATION 10/23/2019 Speech Therapy Frequency (ACUTE ONLY) min 2x/week Treatment Duration 2 weeks      CHL IP ORAL PHASE 10/23/2019 Oral Phase Impaired Oral - Pudding Teaspoon -- Oral - Pudding Cup -- Oral - Honey Teaspoon -- Oral - Honey Cup -- Oral - Nectar Teaspoon -- Oral - Nectar Cup WFL Oral - Nectar Straw WFL Oral - Thin Teaspoon -- Oral - Thin Cup Premature spillage Oral - Thin Straw Premature spillage Oral - Puree WFL Oral - Mech Soft Impaired mastication;Delayed oral transit Oral - Regular -- Oral - Multi-Consistency -- Oral - Pill -- Oral Phase - Comment --  CHL IP PHARYNGEAL PHASE 10/23/2019 Pharyngeal Phase Impaired Pharyngeal- Pudding Teaspoon -- Pharyngeal -- Pharyngeal- Pudding Cup -- Pharyngeal -- Pharyngeal- Honey Teaspoon -- Pharyngeal -- Pharyngeal- Honey Cup -- Pharyngeal -- Pharyngeal- Nectar Teaspoon -- Pharyngeal -- Pharyngeal- Nectar Cup WFL Pharyngeal -- Pharyngeal- Nectar Straw WFL Pharyngeal -- Pharyngeal- Thin Teaspoon --  Pharyngeal -- Pharyngeal- Thin Cup Reduced airway/laryngeal closure;Penetration/Aspiration during swallow Pharyngeal Material enters airway, passes BELOW cords without  attempt by patient to eject out (silent aspiration) Pharyngeal- Thin Straw Reduced airway/laryngeal closure;Penetration/Aspiration during swallow Pharyngeal Material enters airway, passes BELOW cords without attempt by patient to eject out (silent aspiration) Pharyngeal- Puree WFL Pharyngeal -- Pharyngeal- Mechanical Soft WFL Pharyngeal -- Pharyngeal- Regular -- Pharyngeal -- Pharyngeal- Multi-consistency -- Pharyngeal -- Pharyngeal- Pill -- Pharyngeal -- Pharyngeal Comment --  CHL IP CERVICAL ESOPHAGEAL PHASE 10/23/2019 Cervical Esophageal Phase WFL Pudding Teaspoon -- Pudding Cup -- Honey Teaspoon -- Honey Cup -- Nectar Teaspoon -- Nectar Cup -- Nectar Straw -- Thin Teaspoon -- Thin Cup -- Thin Straw -- Puree -- Mechanical Soft -- Regular -- Multi-consistency -- Pill -- Cervical Esophageal Comment -- Mahala MenghiniLaura N., M.A. CCC-SLP Acute Rehabilitation Services Pager [phone] Office [phone] 10/23/2019, 3:28 PM                   Scheduled Meds: .  stroke: mapping our early stages of recovery book   Does not apply Once  . amLODipine  10 mg Oral Daily  . carvedilol  25 mg Oral BID WC  . chlorhexidine  15 mL Mouth Rinse BID  . Chlorhexidine Gluconate Cloth  6 each Topical Daily  . docusate  100 mg Oral BID  . enoxaparin (LOVENOX) injection  40 mg Subcutaneous Q24H  . hydrALAZINE  25 mg Oral Q8H  . insulin aspart  0-15 Units Subcutaneous Q4H  . insulin aspart  3 Units Subcutaneous Q4H  . insulin detemir  10 Units Subcutaneous BID  . mouth rinse  15 mL Mouth Rinse q12n4p  . pantoprazole  40 mg Oral QHS  . polyethylene glycol  17 g Oral Daily  . potassium chloride  40 mEq Oral Q4H  . senna-docusate  1 tablet Oral BID   Continuous Infusions: . sodium chloride 75 mL/hr at 10/25/19 0514  . sodium chloride 75 mL/hr at 10/25/19 0844     LOS: 10 days    Time spent: 35 min    Burke Keelshristopher Ayza Ripoll, MD [facility]  If 7PM-7AM, please contact night-coverage  10/25/2019, 12:27 PM

## 2019-10-25 NOTE — Progress Notes (Signed)
STROKE TEAM PROGRESS NOTE   INTERVAL HISTORY No family at bedside. Pt lying in bed, lethargic but able to arouse easily, follows commands. Some improvement of language output than yesterday. Still has right hemiparesis. Her Cre improved but WBC continues to elevate. Na 154, on 1/2NS.   OBJECTIVE Vitals:   10/25/19 0421 10/25/19 0500 10/25/19 0600 10/25/19 0731  BP: (!) 162/107   (!) 139/104  Pulse: 98   96  Resp: 18 15 15 16   Temp: 98.3 F (36.8 C)   97.6 F (36.4 C)  TempSrc: Oral   Oral  SpO2: 99%   100%  Weight:       CBC:  Recent Labs  Lab 10/20/19 0451 10/25/19 0257  WBC 13.3* 15.5*  HGB 11.9* 13.4  HCT 39.2 43.8  MCV 86.9 87.3  PLT 216 314   Basic Metabolic Panel:  Recent Labs  Lab 10/20/19 0451 10/20/19 0451 10/21/19 0609 10/22/19 2121 10/24/19 0337 10/24/19 1255 10/25/19 0257  NA 150*   < >  --  153*   < > 154* 154*  K 3.8   < >  --  3.4*   < > 4.6 3.3*  CL 116*   < >  --  117*   < > 118* 120*  CO2 24   < >  --  22   < > 23 24  GLUCOSE 146*   < >  --  166*   < > 237* 113*  BUN 41*   < >  --  51*   < > 50* 42*  CREATININE 0.92   < >  --  1.15*   < > 1.30* 1.12*  CALCIUM 9.0   < >  --  9.3   < > 9.2 8.8*  MG 2.5*   < > 2.5* 2.9*  --   --   --   PHOS 4.8*  --  4.0  --   --   --   --    < > = values in this interval not displayed.    Lipid Panel:     Component Value Date/Time   CHOL 274 (H) 10/15/2019 1949   TRIG 287 (H) 10/18/2019 0539   HDL 64 10/15/2019 1949   CHOLHDL 4.3 10/15/2019 1949   VLDL 24 10/15/2019 1949   LDLCALC 186 (H) 10/15/2019 1949   HgbA1c:  Lab Results  Component Value Date   HGBA1C 9.1 (H) 10/15/2019   Urine Drug Screen:     Component Value Date/Time   LABOPIA NONE DETECTED 10/16/2019 2204   COCAINSCRNUR NONE DETECTED 10/16/2019 2204   LABBENZ POSITIVE (A) 10/16/2019 2204   AMPHETMU NONE DETECTED 10/16/2019 2204   THCU NONE DETECTED 10/16/2019 2204   LABBARB NONE DETECTED 10/16/2019 2204    Alcohol Level      Component Value Date/Time   ETH <10 10/15/2019 1922    IMAGING past 24h DG CHEST PORT 1 VIEW  Result Date: 10/24/2019 CLINICAL DATA:  Stroke EXAM: PORTABLE CHEST 1 VIEW COMPARISON:  Radiograph 10/20/2019 FINDINGS: Persistent bandlike opacities in right lung base favoring subsegmental atelectatic change. Additional hazy areas of basilar atelectasis bilaterally. No focal consolidation, pneumothorax, effusion or convincing features of edema. Interval removal of the transesophageal endotracheal tubes. Telemetry leads remain over the chest. High attenuation contrast material is noted in the colon at the level of the splenic flexure. Soft tissues and osseous structures are otherwise unremarkable. IMPRESSION: 1. Interval removal of the transesophageal and endotracheal tubes. 2. Bibasilar atelectasis including more  subsegmental atelectasis in the right lung base. Electronically Signed   By: Lovena Le M.D.   On: 10/24/2019 23:07     PHYSICAL EXAM  Temp:  [97.6 F (36.4 C)-100.9 F (38.3 C)] 97.6 F (36.4 C) (06/27 0731) Pulse Rate:  [96-108] 96 (06/27 0731) Resp:  [10-32] 16 (06/27 0731) BP: (114-162)/(85-110) 139/104 (06/27 0731) SpO2:  [96 %-100 %] 100 % (06/27 0731)  General - Well nourished, well developed middle-aged African-American lady, not in distress  Ophthalmologic - fundi not visualized due to noncooperation.  Cardiovascular - Regular rate and rhythm.  Neuro - lethargic but can easily aroused when stimulated. Able to tell me her name but then perseverated on name for orientation questions. Intermittent word salad. Able to follow some simple commands but not all of them.  Pupils are equal reactive to light. Blinking to visual threat bilaterally. No gaze palsy. Right lower facial weakness. Tongue protrusion midline. Left UE 4/5, LLE 3/5, RLE 2/5 proximal and 3/5 distal, RUE 3/5. DTR 1+ and no babinski. Sensation, coordination not cooperative and gait not tested.     ASSESSMENT/PLAN [name] is a 54 y.o. female with DM, HTN, previous hx of aneurysm repair and arthritis, presenting with acute onset of right sided weakness and depressed level of consciousness with garbled speech after complaining of a headache earlier in the day.   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 Intraventricular penetration. Early dilatation of the lateral ventricles.   CT head - No significant interval change in size and morphology of acute intraparenchymal hemorrhage emanating from the left thalamus, estimated volume 11 CC. 5 mm MLS. IVH similar to previous. Associated obstructive hydrocephalus appears slightly worsened from previous.   MRI head - not able to perform due to previous aneurysm clips  CTA Head 6/19 - No significant residual or recurrent aneurysm.   CT Head 6/24 - Interval improvement in left BGa hematoma. Resolution of IVH. No hydrocephalus.  Carotid Doppler - unremarkable  2D Echo - EF 60 - 65%. No cardiac source of emboli identified.  Sars Corona Virus 2  - negative  LDL - 186  HgbA1c - 7.7  UDS - benzodiazepine  VTE prophylaxis - Lovenox 40 mg sq daily   No antithrombotic prior to admission, now on No antithrombotic  Therapy recommendations:  CIR   Disposition:  Pending  Obstructive hydrocephalus s/p EVD  CT repeat showed worsening obstructive hydrocephalus  Neurosurgery on board  Status post EVD [name]  CT Head 6/24 - Interval improvement in left BGa hematoma. Resolution of IVH. No hydrocephalus.   EVD self removed 6/24  Acute Respiratory Failure  Intubated  Prn precedex, fentanyl  CCM on board  Self extubation 6/24 - tolerating well  Hypernatremia and AKI  Na 155->154  Creatinine 0.92-1.15-0.98-1.30-> 1.12  Put on 1/2 NS @ 75 -> 100  Encourage po intake  Leukocytosis  WBCs - 9.9->24K->17.9-16.4-14.9-13.3->15.5  (afebrile)  UA neg 6/26  CXR 6/26 bibasilar atelectasis  CBC monitoring  History of aneurysm  As per husband, it was many years ago  Status post aneurysm clip  Clip not compatible with MRI due to no detailed information from husband  CTA head 6/19 - No significant residual or recurrent aneurysm.   Hypertensive emergency  Home BP meds: Norvasc ; Coreg ; Apresoline  Current BP meds: home meds resumed  Treated with cleviprex, now off . Long-term BP goal normotensive  Hyperlipidemia  Home Lipid lowering medication: none   LDL 186, goal < 70  No statin for now due to ICH   Consider statin at discharge  Diabetes type II, uncontrolled  Home diabetic meds: Jardiance ; Actos  Current diabetic meds: levemir  SSI  CBG monitoring  HgbA1c 7.7, goal < 7.0  Close PCP follow up  Dysphagia  Due to intubation the stroke  NPO -> tube feeding-> dysphagia 2 diet nectar thick liquids  1/2 NS at 75 -> 100   Speech on board  Other Stroke Risk Factors  Advanced age  Obesity, s/p bariatric sugery, recommend weight loss, diet and exercise as appropriate   Family hx stroke (mother)   Hx of cardiomyopathy  History aneurysm s/p repair  obstructive sleep apnea per pt report  Other Active Problems  Hypokalemia 3.8->3.4 ->5.6->4.6->3.3 - supplemented  Lethargic but unable to get amantadine/ritalin d/t hx QT prolongation  Paroxysmal SVT - on home coreg 25 bid  Hospital day # 10  Marvel Plan, MD PhD Stroke Neurology 10/25/2019 2:24 PM  To contact Stroke Continuity provider, please refer to [email or web address]. After hours, contact General Neurology

## 2019-10-26 DIAGNOSIS — I615 Nontraumatic intracerebral hemorrhage, intraventricular: Secondary | ICD-10-CM | POA: Diagnosis not present

## 2019-10-26 DIAGNOSIS — I610 Nontraumatic intracerebral hemorrhage in hemisphere, subcortical: Secondary | ICD-10-CM | POA: Diagnosis not present

## 2019-10-26 DIAGNOSIS — R400 Somnolence: Secondary | ICD-10-CM

## 2019-10-26 DIAGNOSIS — G911 Obstructive hydrocephalus: Secondary | ICD-10-CM | POA: Diagnosis not present

## 2019-10-26 DIAGNOSIS — E876 Hypokalemia: Secondary | ICD-10-CM | POA: Diagnosis not present

## 2019-10-26 DIAGNOSIS — E860 Dehydration: Secondary | ICD-10-CM | POA: Diagnosis not present

## 2019-10-26 DIAGNOSIS — J9601 Acute respiratory failure with hypoxia: Secondary | ICD-10-CM | POA: Diagnosis not present

## 2019-10-26 LAB — CBC
HCT: 41.3 % (ref 36.0–46.0)
Hemoglobin: 12.6 g/dL (ref 12.0–15.0)
MCH: 26.7 pg (ref 26.0–34.0)
MCHC: 30.5 g/dL (ref 30.0–36.0)
MCV: 87.5 fL (ref 80.0–100.0)
Platelets: 275 10*3/uL (ref 150–400)
RBC: 4.72 MIL/uL (ref 3.87–5.11)
RDW: 14.1 % (ref 11.5–15.5)
WBC: 14.5 10*3/uL — ABNORMAL HIGH (ref 4.0–10.5)
nRBC: 0 % (ref 0.0–0.2)

## 2019-10-26 LAB — GLUCOSE, CAPILLARY
Glucose-Capillary: 159 mg/dL — ABNORMAL HIGH (ref 70–99)
Glucose-Capillary: 186 mg/dL — ABNORMAL HIGH (ref 70–99)
Glucose-Capillary: 219 mg/dL — ABNORMAL HIGH (ref 70–99)
Glucose-Capillary: 158 mg/dL — ABNORMAL HIGH (ref 70–99)

## 2019-10-26 LAB — BASIC METABOLIC PANEL
Anion gap: 10 (ref 5–15)
BUN: 28 mg/dL — ABNORMAL HIGH (ref 6–20)
CO2: 22 mmol/L (ref 22–32)
Calcium: 8.4 mg/dL — ABNORMAL LOW (ref 8.9–10.3)
Chloride: 118 mmol/L — ABNORMAL HIGH (ref 98–111)
Creatinine, Ser: 0.96 mg/dL (ref 0.44–1.00)
GFR calc Af Amer: >60 mL/min (ref >60)
GFR calc non Af Amer: >60 mL/min (ref >60)
Glucose, Bld: 194 mg/dL — ABNORMAL HIGH (ref 70–99)
Potassium: 3.7 mmol/L (ref 3.5–5.1)
Sodium: 150 mmol/L — ABNORMAL HIGH (ref 135–145)

## 2019-10-26 MED ORDER — ATORVASTATIN CALCIUM 40 MG PO TABS
40.0000 mg | ORAL_TABLET | Freq: Every day | ORAL | Status: DC
  Administered 2019-10-26 – 2019-11-23 (×29): 40 mg via ORAL
  Filled 2019-10-26 (×29): qty 1
  Filled 2019-10-26: qty 4

## 2019-10-26 MED ORDER — LABETALOL HCL 5 MG/ML IV SOLN: 5.0000 mg | INTRAVENOUS | Status: DC | PRN

## 2019-10-26 MED ORDER — AMANTADINE HCL 50 MG/5ML PO SYRP
100.0000 mg | ORAL_SOLUTION | Freq: Two times a day (BID) | ORAL | Status: DC
  Administered 2019-10-26 – 2019-11-14 (×39): 100 mg via ORAL
  Filled 2019-10-26 (×43): qty 10

## 2019-10-26 MED ORDER — HYDRALAZINE HCL 50 MG PO TABS
50.0000 mg | ORAL_TABLET | Freq: Three times a day (TID) | ORAL | Status: DC
  Administered 2019-10-26 – 2019-10-27 (×3): 50 mg via ORAL
  Filled 2019-10-26 (×3): qty 1

## 2019-10-26 NOTE — Plan of Care (Signed)
  Problem: Coping: Goal: Will verbalize positive feelings about self Outcome: Progressing Goal: Will identify appropriate support needs Outcome: Progressing   Problem: Intracerebral Hemorrhage Tissue Perfusion: Goal: Complications of Intracerebral Hemorrhage will be minimized Outcome: Progressing

## 2019-10-26 NOTE — Progress Notes (Signed)
  Speech Language Pathology Treatment: Dysphagia  Patient Details Name: Latoya Cox MRN: [id number] DOB: [birth year] Today's Date: 10/26/2019 Time: 8315-1761 SLP Time Calculation (min) (ACUTE ONLY): 26 min  Assessment / Plan / Recommendation Clinical Impression  Pt was drowsy but became more alert after being repositioned for lunch meal. She kept her eyes mostly closed and cleared her mouth appropriately but needing moderate amounts of extra time even for pureed and chopped foods. No overt s/s of aspiration are noted though. Recommend continuing with current diet for now, but anticipate good prognosis for advancement as she can maintain her alertness.    HPI HPI: Pt with acute headache and slurred speech, brought by EMS to hospital. CT head was obtained, revealing an ICH originating from the left basal ganglila, extending into the left lateral, third and 4th ventricles, with a small amount of blood in the right lateral ventricle as well. Early stages of hydrocephalus were also noted on CT. Acute hypoxia from respiratory failure, requiring intubation on 10/16/19. Extubated 10/21/19.      SLP Plan  Continue with current plan of care       Recommendations  Diet recommendations: Dysphagia 2 (fine chop);Nectar-thick liquid Liquids provided via: Cup;Straw Medication Administration: Crushed with puree Supervision: Staff to assist with self feeding;Full supervision/cueing for compensatory strategies Compensations: Minimize environmental distractions Postural Changes and/or Swallow Maneuvers: Seated upright 90 degrees                Oral Care Recommendations: Oral care BID Follow up Recommendations: Inpatient Rehab SLP Visit Diagnosis: Dysphagia, oropharyngeal phase (R13.12) Plan: Continue with current plan of care       GO                Mahala Menghini., M.A. CCC-SLP Acute Rehabilitation Services Pager [phone] Office [phone]   10/26/2019, 1:02 PM

## 2019-10-26 NOTE — Progress Notes (Signed)
STROKE TEAM PROGRESS NOTE   INTERVAL HISTORY Husband and RN are at bedside. Pt lying in bed, lethargic but able to arouse, follows most simple commands. Still has right hemiparesis. Her Cre normalized and WBC trending down. Na down to 150, continue on 1/2NS. Will also put on amantadine.   OBJECTIVE Vitals:   10/26/19 0600 10/26/19 0700 10/26/19 0846 10/26/19 1153  BP:   (!) 151/98 (!) 139/96  Pulse:   88 88  Resp: 13 11 16 14   Temp:   98.4 F (36.9 C) 98.8 F (37.1 C)  TempSrc:   Oral Oral  SpO2:   98% 96%  Weight:      Height:       CBC:  Recent Labs  Lab 10/25/19 0257 10/26/19 0302  WBC 15.5* 14.5*  HGB 13.4 12.6  HCT 43.8 41.3  MCV 87.3 87.5  PLT 314 275   Basic Metabolic Panel:  Recent Labs  Lab 10/20/19 0451 10/20/19 0451 10/21/19 0609 10/22/19 2121 10/24/19 0337 10/25/19 2055 10/26/19 0302  NA 150*   < >  --  153*   < > 148* 150*  K 3.8   < >  --  3.4*   < > 4.0 3.7  CL 116*   < >  --  117*   < > 117* 118*  CO2 24   < >  --  22   < > 20* 22  GLUCOSE 146*   < >  --  166*   < > 331* 194*  BUN 41*   < >  --  51*   < > 36* 28*  CREATININE 0.92   < >  --  1.15*   < > 1.03* 0.96  CALCIUM 9.0   < >  --  9.3   < > 8.5* 8.4*  MG 2.5*   < > 2.5* 2.9*  --   --   --   PHOS 4.8*  --  4.0  --   --   --   --    < > = values in this interval not displayed.    Lipid Panel:     Component Value Date/Time   CHOL 274 (H) 10/15/2019 1949   TRIG 287 (H) 10/18/2019 0539   HDL 64 10/15/2019 1949   CHOLHDL 4.3 10/15/2019 1949   VLDL 24 10/15/2019 1949   LDLCALC 186 (H) 10/15/2019 1949   HgbA1c:  Lab Results  Component Value Date   HGBA1C 9.1 (H) 10/15/2019   Urine Drug Screen:     Component Value Date/Time   LABOPIA NONE DETECTED 10/16/2019 2204   COCAINSCRNUR NONE DETECTED 10/16/2019 2204   LABBENZ POSITIVE (A) 10/16/2019 2204   AMPHETMU NONE DETECTED 10/16/2019 2204   THCU NONE DETECTED 10/16/2019 2204   LABBARB NONE DETECTED 10/16/2019 2204    Alcohol  Level     Component Value Date/Time   ETH <10 10/15/2019 1922    IMAGING past 24h No results found.   PHYSICAL EXAM  Temp:  [98 F (36.7 C)-98.8 F (37.1 C)] 98.8 F (37.1 C) (06/28 1153) Pulse Rate:  [59-98] 88 (06/28 1153) Resp:  [0-18] 14 (06/28 1153) BP: (126-164)/(77-99) 139/96 (06/28 1153) SpO2:  [96 %-100 %] 96 % (06/28 1153) Weight:  [69.8 kg] 69.8 kg (06/27 1818)  General - Well nourished, well developed middle-aged African-American lady, not in distress  Ophthalmologic - fundi not visualized due to noncooperation.  Cardiovascular - Regular rate and rhythm.  Neuro - lethargic but can be  aroused with voice stimulation. Able to tell me her name, her husband name, relation with husband, intermittent perseveration. Able to follow some simple commands but not all of them.  Pupils are equal reactive to light. Blinking to visual threat bilaterally. No gaze palsy. Right lower facial weakness. Tongue protrusion midline. Left UE 4/5, LLE 3/5, RLE 2/5 proximal and 3/5 distal, RUE 3/5. DTR 1+ and no babinski. Sensation subjectively symmetrical, coordination not cooperative and gait not tested.    ASSESSMENT/PLAN Latoya Cox is a 54 y.o. female with DM, HTN, previous hx of aneurysm repair and arthritis, presenting with acute onset of right sided weakness and depressed level of consciousness with garbled speech after complaining of a headache earlier in the day.   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 Intraventricular penetration. Early dilatation of the lateral ventricles.   CT head - No significant interval change in size and morphology of acute intraparenchymal hemorrhage emanating from the left thalamus, estimated volume 11 CC. 5 mm MLS. IVH similar to previous. Associated obstructive hydrocephalus appears slightly worsened from previous.   MRI head - not able to perform due to  previous aneurysm clips  CTA Head 6/19 - No significant residual or recurrent aneurysm.   CT Head 6/24 - Interval improvement in left BGa hematoma. Resolution of IVH. No hydrocephalus.  Carotid Doppler - unremarkable  2D Echo - EF 60 - 65%. No cardiac source of emboli identified.  Sars Corona Virus 2  - negative  LDL - 186  HgbA1c - 7.7  UDS - benzodiazepine  VTE prophylaxis - Lovenox 40 mg sq daily   No antithrombotic prior to admission, now on No antithrombotic  Therapy recommendations:  CIR   Disposition:  Pending  Obstructive hydrocephalus s/p EVD  CT repeat showed worsening obstructive hydrocephalus  Neurosurgery on board  Status post EVD [name]  CT Head 6/24 - Interval improvement in left BGa hematoma. Resolution of IVH. No hydrocephalus.   EVD self removed 6/24  Acute Respiratory Failure  Intubated  Prn precedex, fentanyl  CCM on board  Self extubation 6/24 - tolerating well  Hypernatremia and AKI  Na 155->154->150  Creatinine 0.92-1.15-0.98-1.30-> 1.12->0.96  On 1/2 NS @ 75 -> 100  Encourage po intake  Continue monitor BMP  Leukocytosis  WBCs - 9.9->24K->17.9-16.4-14.9-13.3->15.5->14.5 (afebrile)  UA neg 6/26  CXR 6/26 bibasilar atelectasis  CBC monitoring  History of aneurysm  As per husband, it was many years ago  Status post aneurysm clip  Clip not compatible with MRI due to no detailed information from husband  CTA head 6/19 - No significant residual or recurrent aneurysm.   Hypertensive emergency  Home BP meds: Norvasc ; Coreg ; Apresoline  Treated with cleviprex, now off  BP on the high end  Continue Norvasc 10, coreg 25 bid  Increase hydralazine 25 Q8 -> 50 Q8 . Long-term BP goal normotensive  Hyperlipidemia  Home Lipid lowering medication: none   LDL 186, goal < 70  Now on lipitor 40  Continue statin at discharge  Diabetes type II, uncontrolled  Home diabetic meds: Jardiance ; Actos  Current  diabetic meds: levemir  SSI  CBG monitoring  HgbA1c 7.7, goal < 7.0  Close PCP follow up  Dysphagia  Due to intubation the stroke  NPO -> tube feeding-> dysphagia 2 diet nectar thick liquids  1/2 NS at 75 -> 100   Speech on board  Other Stroke Risk Factors  Advanced age  Obesity, s/p bariatric sugery, recommend weight loss, diet and exercise as appropriate   Family hx stroke (mother)   Hx of cardiomyopathy  History aneurysm s/p repair  obstructive sleep apnea per pt report  Other Active Problems  Hypokalemia 3.8->3.4 ->5.6->4.6->3.3->3.7   Lethargic and put on amantadine (QTc 411 on 6/24)  Paroxysmal SVT - on home coreg 25 bid  Hospital day # 11  Marvel Plan, MD PhD Stroke Neurology 10/26/2019 1:24 PM  To contact Stroke Continuity provider, please refer to [email or web address]. After hours, contact General Neurology

## 2019-10-26 NOTE — Progress Notes (Signed)
[facility]                                                                              Patient Demographics  Latoya Cox, is a 54 y.o. female, DOB - 1965-10-27, ZOX:[id number]  Admit date - 10/15/2019   Admitting Physician Caryl Pina, MD  Outpatient Primary MD for the patient is Myrlene Broker, MD  Outpatient specialists:   LOS - 11  days   Medical records reviewed and are as summarized below:    Chief Complaint  Patient presents with  . Code Stroke       Brief summary   Per admitting MD: WJX:BJYNWG Medleyis an 54 y.o.femalewith DM, HTN and arthritis,[name] and depressed level of consciousness with garbled speech at home, after complaining of a headacheearlier in the day. Husband told EMS that the patient had been complaining of a headache since the morning, but was with no deficits at that time. At 4:30 PM, she continued to complain about her headache and she also felt sleepy, so she took a nap. Her husband checked on her at 6:30 PM, noted the right sided weakness and depressed level of consciousness, and called EMS. On EMS arrival, they noted the same, as well as right facial droop. She vomited en route and continued to vomit intermittently on arrival to the ED. She was obtunded to somnolent and unable to answer questions intelligibly. STAT CT head was obtained, revealing an ICH originating from the left basal ganglila, extending into the left lateral, third and 4th ventricles, with a small amount of blood in the right lateral ventricle as well. Early stages of hydrocephalus were also noted on CT   Admitted to ICU by Neuro. 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 Intraventricular penetration. Early dilatation of the lateral ventricles.  -MRI head not performed due to previous aneurysm clips -CTA head 6/19 showed no significant residual or recurrent aneurysm.  -CT head 6/24 showed Interval improvement in leftbasal ganglia hematoma. Resolution ofIVH.No hydrocephalus. -Carotid Dopplers unremarkable -2D echo- EF 60 - 65%. No cardiac source of emboli identified. -LDL 186, hemoglobin A1c 7.7 -No antithrombotic prior to admission, now on no antithrombotics -PT evaluation recommended CIR, consult placed today -Much more alert and awake today, still has cognitive deficits, oriented to self, place  Active problems  Obstructive hydrocephalus s/p EVD -CT head showed worsening obstructive hydrocephalus -Neurosurgery consulted, status post EVD, Dr. Venetia Maxon -ET head 6/24 showed Interval improvement in leftbasal ganglia hematoma. Resolution ofIVH.No hydrocephalus. -EVD self removed 6/24  Acute respiratory failure with hypoxia -Patient was intubated, followed by CCM -Self extubated on 6/24, tolerating well  Hypernatremia -Sodium 150, creatinine improved 0.9 -Currently on half-normal saline at 100cc  an hour -BMET in a.m., if sodium trending up, will change to D5 water, encourage p.o. diet  History of aneurysm -Status post aneurysm clip, per husband many years ago -CTA head 6/19, no significant residual or recurrent aneurysm   Hypertensive emergency -BP currently stable, continue Norvasc, Coreg, hydralazine -Patient was treated with Cleviprex, now off  Hyperlipidemia LDL 186, goal less than seventy -will place on statin at discharge  Diabetes mellitus type II, uncontrolled Hemoglobin A1c 7.7, resume outpatient meds at discharge For now continue sliding scale insulin,  Levemir  Dysphagia -Due to intubation and stroke -Continue dysphagia 2 diet, nectar thick liquids, speech following -Per RN, much more alert and oriented today, will do SLP evaluation, possibly may advance diet  Leukocytosis -Unclear etiology, no fevers, UA negative on 6/26 -Chest x-ray 6/26 showed bibasilar atelectasis including more subsegmental atelectasis in right lung base  Paroxysmal SVT -Resolved, continue Coreg   Code Status: Full CODE STATUS DVT Prophylaxis:   SCD's Family Communication: Discussed all imaging results, lab results, explained to the patient   Disposition Plan:     Status is: Inpatient  Remains inpatient appropriate because:Inpatient level of care appropriate due to severity of illness   Dispo: The patient is from: Home              Anticipated d/c is to: CIR              Anticipated d/c date is: 1 day              Patient currently is not medically stable to d/c.  Still cognitive deficits with hyponatremia      Time Spent in minutes     Procedures:    Consultants:   Neurology Neurosurgery  Antimicrobials:   Anti-infectives (From admission, onward)   None          Medications  Scheduled Meds: .  stroke: mapping our early stages of recovery book   Does not apply Once  . amLODipine  10 mg Oral Daily  . carvedilol  25 mg Oral BID WC  . chlorhexidine  15 mL Mouth Rinse BID  . Chlorhexidine Gluconate Cloth  6 each Topical Daily  . docusate  100 mg Oral BID  . enoxaparin (LOVENOX) injection  40 mg Subcutaneous Q24H  . hydrALAZINE  25 mg Oral Q8H  . insulin aspart  0-15 Units Subcutaneous TID WC  . insulin detemir  10 Units Subcutaneous BID  . mouth rinse  15 mL Mouth Rinse q12n4p  . pantoprazole  40 mg Oral QHS  . polyethylene glycol  17 g Oral Daily  . senna-docusate  1 tablet Oral BID   Continuous Infusions: . sodium chloride 100 mL/hr at 10/26/19 0143   PRN Meds:.acetaminophen **OR** [DISCONTINUED] acetaminophen  (TYLENOL) oral liquid 160 mg/5 mL **OR** acetaminophen, labetalol      Subjective:   Latoya Cox was seen and examined today.  Alert and awake, oriented to self, place (hospital), otherwise still has cognitive deficits, slower to respond.  Per RN, mental status is improving.  No complaints per patient.  No fevers or chills, overnight issues.  No chest pain, nausea vomiting or abdominal pain.   Objective:   Vitals:   10/26/19 0500 10/26/19 0600 10/26/19 0700 10/26/19 0846  BP:    (!) 151/98  Pulse:    88  Resp: Temp:    98.4 F (36.9 C)  TempSrc:    Oral  SpO2:    98%  Weight:      Height:        Intake/Output Summary (Last 24 hours) at 10/26/2019 1010 Last data filed at 10/26/2019 2993 Gross per 24 hour  Intake 2566.74 ml  Output 1550 ml  Net 1016.74 ml     Wt Readings from Last 3 Encounters:  10/25/19 69.8 kg  10/05/19 78 kg  01/14/19 80.3 kg     Exam  General: Alert and oriented x self and place,   cardiovascular: S1 S2 auscultated, no murmurs, RRR  Respiratory: Clear to auscultation bilaterally, no wheezing, rales or rhonchi  Gastrointestinal: Soft, nontender, nondistended, + bowel sounds  Ext: no pedal edema bilaterally  Neuro: Follows simple commands,Left side 3/5, Right side 3/5    Musculoskeletal: No digital cyanosis, clubbing  Skin: No rashes  Psych: alert and awake   Data Reviewed:  I have personally reviewed following labs and imaging studies  Micro Results No results found for this or any previous visit (from the past 240 hour(s)).  Radiology Reports CT ANGIO HEAD W OR WO CONTRAST  Result Date: 10/17/2019 CLINICAL DATA:  Intraparenchymal hemorrhage. EXAM: CT ANGIOGRAPHY HEAD TECHNIQUE: Multidetector CT imaging of the head was performed using the standard protocol during bolus administration of intravenous contrast. Multiplanar CT image reconstructions and MIPs were obtained to evaluate the vascular anatomy. CONTRAST:  44mL  OMNIPAQUE IOHEXOL 350 MG/ML SOLN COMPARISON:  CT head without contrast 10/16/2019 at 1:48 a.m. FINDINGS: CT HEAD Brain: Hemorrhage centered in the left thalamus is stable in size. Intraventricular blood has increased. Left frontal ventriculostomy catheter is in place. Midline shift is slightly more prominent. Blood is seen in the third and fourth ventricles. No new parenchymal hemorrhage is present. Vascular: Aneurysm clip is again noted. Minimal vascular calcifications are present. No hyperdense vessel is evident. Skull: Craniotomy is noted. Calvarium is otherwise within normal limits. Sinuses: The paranasal sinuses and mastoid air cells are clear. Orbits: The globes and orbits are within normal limits. CTA HEAD Anterior circulation: Atherosclerotic changes are noted within the cavernous internal carotid arteries. No significant stenosis is present. Aneurysm clip is in place. No significant residual recurrent aneurysm is present. The A1 and M1 segments are normal. The anterior communicating artery is patent. MCA bifurcations are intact. ACA and MCA branch vessels are unremarkable. Posterior circulation: The vertebral arteries are codominant. PICA origins are visualized and normal. The basilar artery is normal. Both posterior cerebral arteries originate from basilar tip. The PCA branch vessels are within normal limits. Venous sinuses: The dural sinuses are patent. The straight sinus and deep cerebral veins patent. Cortical veins are unremarkable. Anatomic variants: None IMPRESSION: 1. Stable size of left thalamic hemorrhage. 2. Increased intraventricular blood. With slight increase in midline shift 3. Left frontal ventriculostomy catheter is in place. 4. No significant residual or recurrent aneurysm. 5. No significant proximal stenosis, aneurysm, or branch vessel occlusion within the Circle of Willis. Normal CTA of the head. No focal etiology for the hemorrhage. Electronically Signed   By: Marin Roberts M.D.    On: 10/17/2019 07:13   CT HEAD WO CONTRAST  Result Date: 10/22/2019 CLINICAL DATA:  Intracranial hemorrhage.  Encephalopathy EXAM: CT HEAD WITHOUT CONTRAST TECHNIQUE: Contiguous axial images were obtained from the base of the skull through the vertex without intravenous contrast. COMPARISON:  CT head 10/17/2019 FINDINGS: Brain: Left medial basal ganglia and thalamic hemorrhage shows interval improvement. No new hemorrhage. No intraventricular hemorrhage is present on today's study. Left ventricular drainage catheter is been removed. No hydrocephalus. Mild  midline shift to the right due to the hematoma. Small subdural hygromas along the tentorium bilaterally unchanged. Negative for acute infarct or mass. Vascular: Negative for hyperdense vessel. Aneurysm clip left ophthalmic artery region Skull: Left pterional craniotomy.  No acute skeletal abnormality. Sinuses/Orbits: Mild mucosal edema paranasal sinuses. Negative orbit. Other: None IMPRESSION: Interval improvement in left basal ganglia hematoma. Resolution of intraventricular hemorrhage. Left ventricular drain has been removed.  No hydrocephalus. Electronically Signed   By: Marlan Palau M.D.   On: 10/22/2019 21:06   CT HEAD WO CONTRAST  Result Date: 10/16/2019 CLINICAL DATA:  Follow-up examination for intracranial hemorrhage. EXAM: CT HEAD WITHOUT CONTRAST TECHNIQUE: Contiguous axial images were obtained from the base of the skull through the vertex without intravenous contrast. COMPARISON:  Prior CT from 10/15/2019. FINDINGS: Brain: Acute intraparenchymal hemorrhage emanating from the left thalamus again seen, not significantly changed in size and morphology as compared to previous exam. This measures 3.4 x 3.1 x 2.0 cm (estimated volume 11 cc). Mildly increased localized edema with trace 5 mm localized left-to-right shift at the septum pellucidum. Associated intraventricular extension with blood seen throughout the ventricular system. Degree of  intraventricular blood is similar. Associated obstructive hydrocephalus appears slightly worsened from previous. No other acute intracranial hemorrhage. No acute large vessel territory infarct. No extra-axial fluid collection or visible mass lesion. Vascular: No hyperdense vessel. Aneurysm clip position near the left ICA terminus again noted. Skull: No scalp soft tissue abnormality. Prior left frontal craniotomy. Sinuses/Orbits: Globes and orbital soft tissues within normal limits. Paranasal sinuses and mastoid air cells remain clear. Other: None. IMPRESSION: 1. No significant interval change in size and morphology of acute intraparenchymal hemorrhage emanating from the left thalamus, estimated volume 11 CC. Mildly increased localized edema with trace 5 mm localized left-to-right shift at the septum pellucidum. 2. Associated intraventricular extension with blood throughout the ventricular system, similar to previous. Associated obstructive hydrocephalus appears slightly worsened from previous. 3. No other new acute intracranial abnormality. Electronically Signed   By: Rise Mu M.D.   On: 10/16/2019 02:11   DG CHEST PORT 1 VIEW  Result Date: 10/24/2019 CLINICAL DATA:  Stroke EXAM: PORTABLE CHEST 1 VIEW COMPARISON:  Radiograph 10/20/2019 FINDINGS: Persistent bandlike opacities in right lung base favoring subsegmental atelectatic change. Additional hazy areas of basilar atelectasis bilaterally. No focal consolidation, pneumothorax, effusion or convincing features of edema. Interval removal of the transesophageal endotracheal tubes. Telemetry leads remain over the chest. High attenuation contrast material is noted in the colon at the level of the splenic flexure. Soft tissues and osseous structures are otherwise unremarkable. IMPRESSION: 1. Interval removal of the transesophageal and endotracheal tubes. 2. Bibasilar atelectasis including more subsegmental atelectasis in the right lung base.  Electronically Signed   By: Kreg Shropshire M.D.   On: 10/24/2019 23:07   DG CHEST PORT 1 VIEW  Result Date: 10/20/2019 CLINICAL DATA:  Intubated. EXAM: PORTABLE CHEST 1 VIEW COMPARISON:  Chest x-ray dated October 18, 2019. FINDINGS: Unchanged endotracheal and enteric tubes. Stable cardiomediastinal silhouette. Normal pulmonary vascularity. Low lung volumes with mild bibasilar atelectasis. No focal consolidation, pleural effusion, or pneumothorax. No acute osseous abnormality. IMPRESSION: 1. Stable support tubes.  No active disease. Electronically Signed   By: Obie Dredge M.D.   On: 10/20/2019 11:55   DG Chest Port 1 View  Result Date: 10/18/2019 CLINICAL DATA:  Respiratory failure.  Evaluate pneumo. EXAM: PORTABLE CHEST 1 VIEW COMPARISON:  October 16, 2019 FINDINGS: The ETT is in good position. The NG tube  terminates below today's film. No pneumothorax. The lungs are clear. Stable cardiomegaly. The hila and mediastinum are unchanged. IMPRESSION: 1. Support apparatus as above. 2. No other acute abnormalities. Electronically Signed   By: Gerome Sam III M.D   On: 10/18/2019 12:05   DG CHEST PORT 1 VIEW  Result Date: 10/16/2019 CLINICAL DATA:  ET tube and OG tube placed EXAM: PORTABLE CHEST 1 VIEW COMPARISON:  June 29, 2016 FINDINGS: The heart size and mediastinal contours are within normal limits. Probable subsegmental atelectasis seen at the right lung base. ETT is 2.8 cm above the carina. NG tube is seen below the diaphragm within the stomach. The visualized skeletal structures are unremarkable. IMPRESSION: ET tube and NG tube in satisfactory position. Subsegmental atelectasis at the right lung base. Electronically Signed   By: Jonna Clark M.D.   On: 10/16/2019 06:55   DG Swallowing Func-Speech Pathology  Result Date: 10/23/2019 Objective Swallowing Evaluation: Type of Study: MBS-Modified Barium Swallow Study  Patient Details Name: Ileene Allie MRN: [id number] Date of Birth: [birth year] Today's Date:  10/23/2019 Time: SLP Start Time (ACUTE ONLY): 1339 -SLP Stop Time (ACUTE ONLY): 1357 SLP Time Calculation (min) (ACUTE ONLY): 18 min Past Medical History: Past Medical History: Diagnosis Date . Arthritis  . Chest wall pain  . Diabetes mellitus without complication (HCC)  . Hypertension  Past Surgical History: Past Surgical History: Procedure Laterality Date . aneurism repair   . lapband   HPI: Pt with acute headache and slurred speech, brought by EMS to hospital. CT head was obtained, revealing an ICH originating from the left basal ganglila, extending into the left lateral, third and 4th ventricles, with a small amount of blood in the right lateral ventricle as well. Early stages of hydrocephalus were also noted on CT. Acute hypoxia from respiratory failure, requiring intubation on 10/16/19. Extubated 10/21/19.  Subjective: alert, cooperative, needs cues Assessment / Plan / Recommendation CHL IP CLINICAL IMPRESSIONS 10/23/2019 Clinical Impression Pt has a mild oropharyngeal dysphagia. Orally, she has premature spillage of thin liquids and mildly prolonged mastication and transit of solids. Her pharyngeal phase is relatively more functional, but when drinking consecutively via cup or straw, there is trace, silent aspiration of thin liquids that spilled prematurely into the pyriform sinuses. No aspiration is observed with any other consistency. Also considering her mentation, recommend starting with Dys 2 (chopped) diet and nectar thick liquids with good potential to progress given additional time post-extubation and for cognitive therapy.  SLP Visit Diagnosis Dysphagia, oropharyngeal phase (R13.12) Attention and concentration deficit following -- Frontal lobe and executive function deficit following -- Impact on safety and function Mild aspiration risk   CHL IP TREATMENT RECOMMENDATION 10/23/2019 Treatment Recommendations Therapy as outlined in treatment plan below   Prognosis 10/23/2019 Prognosis for Safe Diet  Advancement Good Barriers to Reach Goals Cognitive deficits Barriers/Prognosis Comment -- CHL IP DIET RECOMMENDATION 10/23/2019 SLP Diet Recommendations Dysphagia 2 (Fine chop) solids;Nectar thick liquid Liquid Administration via Cup;Straw Medication Administration Crushed with puree Compensations Minimize environmental distractions Postural Changes Seated upright at 90 degrees   CHL IP OTHER RECOMMENDATIONS 10/23/2019 Recommended Consults -- Oral Care Recommendations Oral care BID Other Recommendations Order thickener from pharmacy;Prohibited food (jello, ice cream, thin soups);Remove water pitcher   CHL IP FOLLOW UP RECOMMENDATIONS 10/23/2019 Follow up Recommendations Inpatient Rehab   CHL IP FREQUENCY AND DURATION 10/23/2019 Speech Therapy Frequency (ACUTE ONLY) min 2x/week Treatment Duration 2 weeks      CHL IP ORAL PHASE 10/23/2019 Oral Phase Impaired Oral -  Pudding Teaspoon -- Oral - Pudding Cup -- Oral - Honey Teaspoon -- Oral - Honey Cup -- Oral - Nectar Teaspoon -- Oral - Nectar Cup WFL Oral - Nectar Straw WFL Oral - Thin Teaspoon -- Oral - Thin Cup Premature spillage Oral - Thin Straw Premature spillage Oral - Puree WFL Oral - Mech Soft Impaired mastication;Delayed oral transit Oral - Regular -- Oral - Multi-Consistency -- Oral - Pill -- Oral Phase - Comment --  CHL IP PHARYNGEAL PHASE 10/23/2019 Pharyngeal Phase Impaired Pharyngeal- Pudding Teaspoon -- Pharyngeal -- Pharyngeal- Pudding Cup -- Pharyngeal -- Pharyngeal- Honey Teaspoon -- Pharyngeal -- Pharyngeal- Honey Cup -- Pharyngeal -- Pharyngeal- Nectar Teaspoon -- Pharyngeal -- Pharyngeal- Nectar Cup WFL Pharyngeal -- Pharyngeal- Nectar Straw WFL Pharyngeal -- Pharyngeal- Thin Teaspoon -- Pharyngeal -- Pharyngeal- Thin Cup Reduced airway/laryngeal closure;Penetration/Aspiration during swallow Pharyngeal Material enters airway, passes BELOW cords without attempt by patient to eject out (silent aspiration) Pharyngeal- Thin Straw Reduced airway/laryngeal  closure;Penetration/Aspiration during swallow Pharyngeal Material enters airway, passes BELOW cords without attempt by patient to eject out (silent aspiration) Pharyngeal- Puree WFL Pharyngeal -- Pharyngeal- Mechanical Soft WFL Pharyngeal -- Pharyngeal- Regular -- Pharyngeal -- Pharyngeal- Multi-consistency -- Pharyngeal -- Pharyngeal- Pill -- Pharyngeal -- Pharyngeal Comment --  CHL IP CERVICAL ESOPHAGEAL PHASE 10/23/2019 Cervical Esophageal Phase WFL Pudding Teaspoon -- Pudding Cup -- Honey Teaspoon -- Honey Cup -- Nectar Teaspoon -- Nectar Cup -- Nectar Straw -- Thin Teaspoon -- Thin Cup -- Thin Straw -- Puree -- Mechanical Soft -- Regular -- Multi-consistency -- Pill -- Cervical Esophageal Comment -- Mahala Menghini., M.A. CCC-SLP Acute Rehabilitation Services Pager [phone] Office [phone] 10/23/2019, 3:28 PM              ECHOCARDIOGRAM COMPLETE  Result Date: 10/16/2019    ECHOCARDIOGRAM REPORT   Patient Name:   ALLYSHA Lader Date of Exam: 10/16/2019 Medical Rec #:  [id number]     Height:       62.0 in Accession #:    [id number]    Weight:       172.0 lb Date of Birth:  1965-09-22      BSA:          1.793 m Patient Age:    54 years      BP:           128/62 mmHg Patient Gender: F             HR:           99 bpm. Exam Location:  Inpatient Procedure: 2D Echo, Color Doppler, Cardiac Doppler and Intracardiac            Opacification Agent Indications:    Stroke i163.9  History:        Patient has no prior history of Echocardiogram examinations.                 Risk Factors:Hypertension and Diabetes.  Sonographer:    Irving Burton Senior RDCS Referring Phys: [id number] Marvel Plan  Sonographer Comments: Echo performed with patient supine and on artificial respirator. IMPRESSIONS  1. Left ventricular ejection fraction, by estimation, is 60 to 65%. The left ventricle has normal function. The left ventricle has no regional wall motion abnormalities. There is mild concentric left ventricular hypertrophy. Left ventricular  diastolic parameters are consistent with Grade I diastolic dysfunction (impaired relaxation).  2. Right ventricular systolic function is normal. The right ventricular size is normal.  3. The mitral valve is normal in structure. No evidence of  mitral valve regurgitation. No evidence of mitral stenosis.  4. The aortic valve is normal in structure. Aortic valve regurgitation is not visualized. No aortic stenosis is present.  5. Aortic dilatation noted. There is borderline dilatation of the aortic root.  6. The inferior vena cava is normal in size with greater than 50% respiratory variability, suggesting right atrial pressure of 3 mmHg. FINDINGS  Left Ventricle: Left ventricular ejection fraction, by estimation, is 60 to 65%. The left ventricle has normal function. The left ventricle has no regional wall motion abnormalities. Definity contrast agent was given IV to delineate the left ventricular  endocardial borders. The left ventricular internal cavity size was normal in size. There is mild concentric left ventricular hypertrophy. Left ventricular diastolic parameters are consistent with Grade I diastolic dysfunction (impaired relaxation). Right Ventricle: The right ventricular size is normal. No increase in right ventricular wall thickness. Right ventricular systolic function is normal. Left Atrium: Left atrial size was normal in size. Right Atrium: Right atrial size was normal in size. Pericardium: There is no evidence of pericardial effusion. Mitral Valve: The mitral valve is normal in structure. Normal mobility of the mitral valve leaflets. No evidence of mitral valve regurgitation. No evidence of mitral valve stenosis. Tricuspid Valve: The tricuspid valve is normal in structure. Tricuspid valve regurgitation is not demonstrated. No evidence of tricuspid stenosis. Aortic Valve: The aortic valve is normal in structure. Aortic valve regurgitation is not visualized. No aortic stenosis is present. Pulmonic Valve: The  pulmonic valve was normal in structure. Pulmonic valve regurgitation is not visualized. No evidence of pulmonic stenosis. Aorta: Aortic dilatation noted. There is borderline dilatation of the aortic root. Venous: The inferior vena cava is normal in size with greater than 50% respiratory variability, suggesting right atrial pressure of 3 mmHg. IAS/Shunts: No atrial level shunt detected by color flow Doppler.  LEFT VENTRICLE PLAX 2D LVIDd:         4.40 cm LVIDs:         2.90 cm LV PW:         1.30 cm LV IVS:        1.20 cm LVOT diam:     2.20 cm LV SV:         49 LV SV Index:   27 LVOT Area:     3.80 cm  RIGHT VENTRICLE RV S prime:     18.30 cm/s LEFT ATRIUM             Index       RIGHT ATRIUM           Index LA diam:        2.90 cm 1.62 cm/m  RA Area:     14.90 cm LA Vol (A2C):   57.1 ml 31.85 ml/m RA Volume:   36.90 ml  20.58 ml/m LA Vol (A4C):   68.0 ml 37.93 ml/m LA Biplane Vol: 65.0 ml 36.25 ml/m  AORTIC VALVE LVOT Vmax:   89.23 cm/s LVOT Vmean:  58.733 cm/s LVOT VTI:    0.128 m  AORTA Ao Root diam: 3.90 cm Ao Asc diam:  3.40 cm  SHUNTS Systemic VTI:  0.13 m Systemic Diam: 2.20 cm Rachelle Hora Croitoru MD Electronically signed by Thurmon Fair MD Signature Date/Time: 10/16/2019/11:50:40 AM    Final    CT HEAD CODE STROKE WO CONTRAST  Result Date: 10/15/2019 CLINICAL DATA:  Code stroke.  Altered mental status.  Headache. EXAM: CT HEAD WITHOUT CONTRAST TECHNIQUE: Contiguous axial images were obtained from  the base of the skull through the vertex without intravenous contrast. COMPARISON:  Head CT 08/26/2018 FINDINGS: Brain: There is acute parenchymal hemorrhage with the epicenter in the left thalamus. The hematoma measures 3.2 x 2.3 x 2.5 cm (volume = 9.6 cm^3). There is intraventricular penetration with blood filling the third ventricle and nearly filling the fourth ventricle. Small amount in the frontal horns of the lateral ventricles. No surrounding edema at this time. Lateral ventricles are dilated compared  to the study of April 2020. Elsewhere, there chronic small-vessel ischemic changes of white matter. No large vessel territory stroke. No sign of mass. No extra-axial collection. 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 Skull: Previous left pterional craniotomy. Sinuses/Orbits: Clear/normal Other: None ASPECTS ([place] Stroke Program Early CT Score) - Ganglionic level infarction (caudate, lentiform nuclei, internal capsule, insula, M1-M3 cortex): 7 - Supraganglionic infarction (M4-M6 cortex): 3 Total score (0-10 with 10 being normal): 10 IMPRESSION: 1. Acute intraparenchymal hemorrhage in the left thalamus, 9.6 cc. Intraventricular penetration. Early dilatation of the lateral ventricles. 2. ASPECTS is 10 3. These results were communicated to Dr. Cheral Marker at 7:38 pmon 6/17/2021by text page via the [facility] messaging system. Electronically Signed   By: Nelson Chimes M.D.   On: 10/15/2019 19:40   VAS US CAROTID  Result Date: 10/19/2019 Carotid Arterial Duplex Study Indications:       CVA. Risk Factors:      Hypertension, Diabetes. Limitations        Today's exam was limited due to patient positioning. Comparison Study:  no prior Performing Technologist: Abram Sander RVS  Examination Guidelines: A complete evaluation includes B-mode imaging, spectral Doppler, color Doppler, and power Doppler as needed of all accessible portions of each vessel. Bilateral testing is considered an integral part of a complete examination. Limited examinations for reoccurring indications may be performed as noted.  Right Carotid Findings: +----------+--------+--------+--------+------------------+--------+           PSV cm/sEDV cm/sStenosisPlaque DescriptionComments +----------+--------+--------+--------+------------------+--------+ CCA Distal51      14                                         +----------+--------+--------+--------+------------------+--------+ ICA Prox  52      12      1-39%    heterogenous               +----------+--------+--------+--------+------------------+--------+ ECA       28      7                                          +----------+--------+--------+--------+------------------+--------+ +----------+--------+-------+--------+-------------------+           PSV cm/sEDV cmsDescribeArm Pressure (mmHG) +----------+--------+-------+--------+-------------------+ [id number]                                         +----------+--------+-------+--------+-------------------+ +---------+--------+--------+--------------+ VertebralPSV cm/sEDV cm/sNot identified +---------+--------+--------+--------------+  Left Carotid Findings: +----------+--------+--------+--------+------------------+--------+           PSV cm/sEDV cm/sStenosisPlaque DescriptionComments +----------+--------+--------+--------+------------------+--------+ CCA Prox  83      12              heterogenous               +----------+--------+--------+--------+------------------+--------+  CCA Distal54      13              heterogenous               +----------+--------+--------+--------+------------------+--------+ ICA Prox  49      15      1-39%   heterogenous               +----------+--------+--------+--------+------------------+--------+ ICA Distal50      17                                         +----------+--------+--------+--------+------------------+--------+ ECA       60      8                                          +----------+--------+--------+--------+------------------+--------+ +----------+--------+--------+--------+-------------------+           PSV cm/sEDV cm/sDescribeArm Pressure (mmHG) +----------+--------+--------+--------+-------------------+ [id number]                                          +----------+--------+--------+--------+-------------------+ +---------+--------+--+--------+--+---------+ VertebralPSV cm/s44EDV  cm/s11Antegrade +---------+--------+--+--------+--+---------+   Summary: Right Carotid: Velocities in the right ICA are consistent with a 1-39% stenosis. Left Carotid: Velocities in the left ICA are consistent with a 1-39% stenosis. Vertebrals: Left vertebral artery demonstrates antegrade flow. Right vertebral             artery was not visualized. *See table(s) above for measurements and observations.  Electronically signed by Delia Heady MD on 10/19/2019 at 1:33:17 PM.    Final     Lab Data:  CBC: Recent Labs  Lab 10/20/19 0451 10/25/19 0257 10/26/19 0302  WBC 13.3* 15.5* 14.5*  HGB 11.9* 13.4 12.6  HCT 39.2 43.8 41.3  MCV 86.9 87.3 87.5  PLT 216 314 275   Basic Metabolic Panel: Recent Labs  Lab 10/19/19 2158 10/19/19 2158 10/20/19 0451 10/20/19 0451 10/21/19 0609 10/22/19 2121 10/22/19 2121 10/24/19 0337 10/24/19 1255 10/25/19 0257 10/25/19 2055 10/26/19 0302  NA 149*   < > 150*   < >  --  153*   < > 155* 154* 154* 148* 150*  K 4.1   < > 3.8   < >  --  3.4*   < > 5.6* 4.6 3.3* 4.0 3.7  CL 116*   < > 116*   < >  --  117*   < > 120* 118* 120* 117* 118*  CO2 24   < > 24   < >  --  22   < > 19* 23 24 20* 22  GLUCOSE 103*   < > 146*   < >  --  166*   < > 162* 237* 113* 331* 194*  BUN 38*   < > 41*   < >  --  51*   < > 46* 50* 42* 36* 28*  CREATININE 0.81   < > 0.92   < >  --  1.15*   < > 0.98 1.30* 1.12* 1.03* 0.96  CALCIUM 9.2   < > 9.0   < >  --  9.3   < > 9.2 9.2 8.8* 8.5* 8.4*  MG 2.4  --  2.5*  --  2.5* 2.9*  --   --   --   --   --   --   PHOS  --   --  4.8*  --  4.0  --   --   --   --   --   --   --    < > = values in this interval not displayed.   GFR: Estimated Creatinine Clearance: 62.8 mL/min (by C-G formula based on SCr of 0.96 mg/dL). Liver Function Tests: No results for input(s): AST, ALT, ALKPHOS, BILITOT, PROT, ALBUMIN in the last 168 hours. No results for input(s): LIPASE, AMYLASE in the last 168 hours. No results for input(s): AMMONIA in the last 168  hours. Coagulation Profile: No results for input(s): INR, PROTIME in the last 168 hours. Cardiac Enzymes: No results for input(s): CKTOTAL, CKMB, CKMBINDEX, TROPONINI in the last 168 hours. BNP (last 3 results) No results for input(s): PROBNP in the last 8760 hours. HbA1C: No results for input(s): HGBA1C in the last 72 hours. CBG: Recent Labs  Lab 10/25/19 0743 10/25/19 1132 10/25/19 1618 10/25/19 2128 10/26/19 0618  GLUCAP 107* 330* 160* 252* 159*   Lipid Profile: No results for input(s): CHOL, HDL, LDLCALC, TRIG, CHOLHDL, LDLDIRECT in the last 72 hours. Thyroid Function Tests: No results for input(s): TSH, T4TOTAL, FREET4, T3FREE, THYROIDAB in the last 72 hours. Anemia Panel: No results for input(s): VITAMINB12, FOLATE, FERRITIN, TIBC, IRON, RETICCTPCT in the last 72 hours. Urine analysis:    Component Value Date/Time   COLORURINE YELLOW 10/24/2019 1618   APPEARANCEUR CLEAR 10/24/2019 1618   LABSPEC 1.029 10/24/2019 1618   PHURINE 5.0 10/24/2019 1618   GLUCOSEU >=500 (A) 10/24/2019 1618   HGBUR NEGATIVE 10/24/2019 1618   BILIRUBINUR NEGATIVE 10/24/2019 1618   KETONESUR 5 (A) 10/24/2019 1618   PROTEINUR 30 (A) 10/24/2019 1618   UROBILINOGEN 0.2 09/04/2014 1850   NITRITE NEGATIVE 10/24/2019 1618   LEUKOCYTESUR TRACE (A) 10/24/2019 1618     Tu Bayle M.D. [facility] 10/26/2019, 10:10 AM   Call night coverage person covering after 7pm

## 2019-10-26 NOTE — Progress Notes (Signed)
Physical Therapy Treatment Patient Details Name: Latoya Cox MRN: [id number] DOB: 02/06/1966 Today's Date: 10/26/2019    History of Present Illness 54 y.o. female with DM, HTN and arthritis, [name] and depressed level of consciousness with garbled speech at home, after complaining of a headache earlier in the day. STAT CT head was obtained, revealing an ICH originating from the left basal ganglila, extending into the left lateral, third and 4th ventricles, with a small amount of blood in the right lateral ventricle as well. Early stages of hydrocephalus were also noted on CT. Pt underwent L frontal IVC placement and intubation on 6/18. Pt extubated on 6/23.    PT Comments    Initially lethargic and unable to focus well, but after exercise, pt able to participate well.  Emphasis on transitions, sit to stand and transfers to recline.  Much of the session did involve rolling for pericare.    Follow Up Recommendations  CIR     Equipment Recommendations  Other (comment) (TBA)    Recommendations for Other Services       Precautions / Restrictions Precautions Precautions: Fall    Mobility  Bed Mobility Overal bed mobility: Needs Assistance Bed Mobility: Rolling;Supine to Sit Rolling: Mod assist   Supine to sit: Mod assist;+2 for physical assistance     General bed mobility comments: assist to come forward and up to EOB  Transfers Overall transfer level: Needs assistance Equipment used: None Transfers: Sit to/from [employer] Transfers Sit to Stand: Mod assist;+2 safety/equipment Stand pivot transfers: Mod assist;+2 physical assistance       General transfer comment: face to face assist of pt to come to stand and pivot with w/shift and LE guarding assist.  Ambulation/Gait                 Stairs             Wheelchair Mobility    Modified Rankin (Stroke Patients Only) Modified Rankin  (Stroke Patients Only) Pre-Morbid Rankin Score: No symptoms Modified Rankin: Moderately severe disability     Balance Overall balance assessment: Needs assistance Sitting-balance support: No upper extremity supported;Feet supported Sitting balance-Leahy Scale: Poor Sitting balance - Comments: pt reliant on UE's for balance assist   Standing balance support: Bilateral upper extremity supported Standing balance-Leahy Scale: Poor Standing balance comment: reliant on external support                            Cognition Arousal/Alertness: Lethargic;Awake/alert Behavior During Therapy: Flat affect Overall Cognitive Status: Impaired/Different from baseline                   Orientation Level: Situation;Time;Place Current Attention Level: Sustained Memory: Decreased recall of precautions Following Commands: Follows one step commands inconsistently;Follows one step commands with increased time Safety/Judgement: Decreased awareness of safety;Decreased awareness of deficits Awareness: Intellectual Problem Solving: Slow processing        Exercises Other Exercises Other Exercises: hip/knee flex/ext with graded resistance in extension.    General Comments        Pertinent Vitals/Pain Pain Assessment: Faces Faces Pain Scale: No hurt    Home Living                      Prior Function            PT Goals (current goals can now be found in  the care plan section) Acute Rehab PT Goals Patient Stated Goal: To improve mobility PT Goal Formulation: Patient unable to participate in goal setting Time For Goal Achievement: 10/31/19 Potential to Achieve Goals: Fair Progress towards PT goals: Progressing toward goals    Frequency    Min 4X/week      PT Plan Current plan remains appropriate    Co-evaluation              AM-PAC PT "6 Clicks" Mobility   Outcome Measure  Help needed turning from your back to your side while in a flat bed  without using bedrails?: A Lot Help needed moving from lying on your back to sitting on the side of a flat bed without using bedrails?: A Lot Help needed moving to and from a bed to a chair (including a wheelchair)?: A Lot Help needed standing up from a chair using your arms (e.g., wheelchair or bedside chair)?: A Lot Help needed to walk in hospital room?: A Lot Help needed climbing 3-5 steps with a railing? : Total 6 Click Score: 11    End of Session   Activity Tolerance: Patient tolerated treatment well Patient left: in chair;with call bell/phone within reach;with chair alarm set Nurse Communication: Mobility status PT Visit Diagnosis: Other abnormalities of gait and mobility (R26.89)     Time: 1550-1611 PT Time Calculation (min) (ACUTE ONLY): 21 min  Charges:  $Therapeutic Activity: 8-22 mins                     10/26/2019  Ginger Carne., PT Acute Rehabilitation Services [phone]  (pager) [phone]  (office)   Tessie Fass Tremain Rucinski 10/26/2019, 6:18 PM

## 2019-10-26 NOTE — Consult Note (Signed)
Physical Medicine and Rehabilitation Consult Reason for Consult: Right side weakness and altered mental status Referring Physician: [facility]   HPI: Latoya Cox is a 54 y.o. right-handed female with history of hypertension, diabetes mellitus.  Per chart review patient lives with spouse.  Independent prior to admission.  Presented 10/15/2019 with acute onset of right side weakness and headache as well as garbled speech.  Admission chemistries with negative alcohol, potassium 2.8, glucose 218, urine drug screen positive benzos.  CT of the head showed acute intraparenchymal hemorrhage in the left thalamus 9.6 cc intraventricular penetration.  Early dilation of the lateral ventricles.  Patient underwent placement of left frontal IVC 10/16/2019 per Dr. Venetia Maxon.  Latest CT and imaging 10/22/2019 showed interval improvement of left basal ganglia hematoma.  Resolution of intraventricular hemorrhage.  No hydrocephalus.  Carotid Dopplers with no ICA stenosis.  Echocardiogram with ejection fraction of 65% grade 1 diastolic dysfunction.  She was cleared to begin Lovenox for DVT prophylaxis 6 10/19/2019.  Close monitoring of blood pressure currently on multiple antihypertensive medications.  Patient did require short-term intubation extubated 10/22/2019.  Bouts of hypernatremia and AKI improved latest creatinine 0.96 sodium 150.  Tolerating a dysphagia #2 nectar thick liquid diet.  Therapy evaluations completed with recommendations of physical medicine rehab consult.   Review of Systems  Constitutional: Positive for fever. Negative for chills.  HENT: Negative for hearing loss.   Eyes: Negative for blurred vision and double vision.  Respiratory: Negative for cough and shortness of breath.   Cardiovascular: Negative for chest pain, palpitations and leg swelling.  Gastrointestinal: Positive for constipation. Negative for heartburn, nausea and vomiting.  Genitourinary: Negative for dysuria, flank pain and hematuria.    Musculoskeletal: Positive for myalgias.  Skin: Negative for rash.  Neurological: Positive for dizziness, weakness and headaches.  All other systems reviewed and are negative.  Past Medical History:  Diagnosis Date  . Arthritis   . Chest wall pain   . Diabetes mellitus without complication (HCC)   . Hypertension    Past Surgical History:  Procedure Laterality Date  . aneurism repair    . lapband     Family History  Problem Relation Age of Onset  . Stroke Mother   . Hypertension Mother   . Arthritis Mother   . Diabetes Mother   . Arthritis Father   . Hypertension Father   . Diabetes Father    Social History:  reports that she has never smoked. She has never used smokeless tobacco. She reports that she does not drink alcohol and does not use drugs. Allergies:  Allergies  Allergen Reactions  . Hydrocodone Itching    Pt states "if i take 2, it makes me itch"   Medications Prior to Admission  Medication Sig Dispense Refill  . amLODipine (NORVASC) 10 MG tablet TAKE 1 TABLET BY MOUTH EVERY DAY (Patient taking differently: Take 10 mg by mouth daily. ) 30 tablet 2  . Ascorbic Acid (VITAMIN C) 100 MG tablet Take 100 mg by mouth daily.    . Benzoyl Peroxide 10 % CREA Use on face twice daily 141 g 3  . carvedilol (COREG) 25 MG tablet TAKE 1 TAB 2 TIMES DAILY WITH A MEAL. OVERDUE FOR ANNUAL APPT MUST SEE PROVIDER FOR FUTURE REFILLS (Patient taking differently: Take 25 mg by mouth in the morning and at bedtime. ) 60 tablet 0  . CLEVER CHEK LANCETS MISC Use daily to check sugars. 100 each 11  . diclofenac (VOLTAREN) 75  MG EC tablet TAKE 1 TABLET BY MOUTH TWICE A DAY (Patient taking differently: Take 75 mg by mouth 2 (two) times daily. ) 60 tablet 11  . Diethylpropion HCl 25 MG TABS Take 1 tablet by mouth every evening.    . Doxylamine Succinate, Sleep, (SLEEP AID PO) Take 1 tablet by mouth at bedtime.    . empagliflozin (JARDIANCE) 25 MG TABS tablet Take 25 mg by mouth daily. 90 tablet  3  . fluconazole (DIFLUCAN) 150 MG tablet Take 1 tablet (150 mg total) by mouth every 3 (three) days. (Patient taking differently: Take 150 mg by mouth every 3 (three) days. As needed if symptoms have not improved.) 5 tablet 0  . fluticasone (FLONASE) 50 MCG/ACT nasal spray Place 1 spray daily as needed into both nostrils for allergies. 16 g 11  . glucose blood (CLEVER CHOICE MICRO TEST) test strip Use as instructed 100 each 12  . glucose blood (ONETOUCH VERIO) test strip 1 each by Other route 2 (two) times daily. Use to check blood sugars twice a day Dx E11.9 100 each 3  . hydrALAZINE (APRESOLINE) 25 MG tablet Take 1 tablet (25 mg total) by mouth 3 (three) times daily as needed (SBP >160, DBP>95). 120 tablet 0  . KLOR-CON M20 20 MEQ tablet TAKE 3 TABLETS BY MOUTH DAILY (NEED OFFICE VISIT AND LABS) 60 tablet 0  . Multiple Vitamins-Minerals (MULTIVITAMIN ADULT) TABS Take 1 tablet by mouth daily.    Marland Kitchen nystatin-triamcinolone ointment (MYCOLOG) APPLY TO AFFECTED AREA TWICE DAILY (Patient taking differently: Apply 1 application topically 2 (two) times daily. ) 120 g 11  . omeprazole (PRILOSEC) 40 MG capsule TAKE 1 CAPSULE BY MOUTH EVERY DAY (Patient taking differently: Take 40 mg by mouth daily. ) 90 capsule 3  . ondansetron (ZOFRAN) 4 MG tablet Take 1 tablet (4 mg total) by mouth every 6 (six) hours as needed for nausea. 20 tablet 0  . ONETOUCH DELICA LANCETS 33G MISC Use to help check blood sugars twice a day Dx E11.9 100 each 3  . pioglitazone (ACTOS) 30 MG tablet TAKE 1 TABLET BY MOUTH EVERY DAY (Patient taking differently: Take 30 mg by mouth daily. ) 90 tablet 1    Home: Home Living Family/patient expects to be discharged to:: Inpatient rehab Living Arrangements: Spouse/significant other, Children  Lives With: Spouse, Other (Comment) (two children)  Functional History: Prior Function Level of Independence: Independent Comments: No family present  Functional Status:  Mobility: Bed  Mobility Overal bed mobility: Needs Assistance Bed Mobility: Supine to Sit Rolling: Total assist Sidelying to sit: Total assist, +2 for physical assistance, HOB elevated Supine to sit: Total assist, +2 for physical assistance, +2 for safety/equipment Sit to supine: Total assist General bed mobility comments: assist to initiate legs (also due to impaired cognition- processing and motor planning) in transfer and full support at trunk to attain upright sitting EOB. Transfers Overall transfer level: Needs assistance Equipment used: 2 person hand held assist Transfers: Sit to/from Stand, [employer] Transfers Sit to Stand: Total assist, +2 physical assistance Squat pivot transfers: Total assist, +2 physical assistance  Lateral/Scoot Transfers: +2 physical assistance, Mod assist General transfer comment: increased assist on R side due to weakness      ADL: ADL Overall ADL's : Needs assistance/impaired Eating/Feeding: NPO Grooming: Wash/dry face, Maximal assistance, Sitting Grooming Details (indicate cue type and reason): Pt sat EOB and hand over hand washed face with max assist at EOBL. Upper Body Bathing: Maximal assistance, Sitting, Bed level Lower  Body Bathing: Total assistance, +2 for physical assistance, Sit to/from stand Upper Body Dressing : Total assistance, Sitting Lower Body Dressing: Total assistance, +2 for physical assistance, Sit to/from stand Lower Body Dressing Details (indicate cue type and reason): pt was able to sustain attention to sock, but had difficulty problem solving (initiation) and motor planning to don sock. Assisted pt with figure 4 method, pt continued to require total A Toilet Transfer: Maximal assistance, +2 for physical assistance, BSC, Squat-pivot Toilet Transfer Details (indicate cue type and reason): simulated to recliner. increased assist on R side due to weakness Toileting- Clothing Manipulation and Hygiene: Total assistance, +2 for physical assistance,  Sit to/from stand Functional mobility during ADLs: Maximal assistance, +2 for physical assistance, +2 for safety/equipment, Cueing for safety, Cueing for sequencing General ADL Comments: pt able to progress engagement from eval being post extubation  Cognition: Cognition Overall Cognitive Status: Impaired/Different from baseline Arousal/Alertness: Lethargic Orientation Level: Oriented to person, Oriented to place Attention: Sustained Sustained Attention: Impaired Sustained Attention Impairment: Verbal basic, Functional basic Memory: Impaired Memory Impairment: Storage deficit, Decreased short term memory, Retrieval deficit Decreased Short Term Memory: Verbal basic Awareness: Impaired Awareness Impairment: Emergent impairment Problem Solving: Impaired Problem Solving Impairment: Functional basic Safety/Judgment: Impaired Cognition Arousal/Alertness: Lethargic Behavior During Therapy: Flat affect Overall Cognitive Status: Impaired/Different from baseline Area of Impairment: Orientation, Attention, Memory, Following commands, Safety/judgement, Awareness, Problem solving Orientation Level: Disoriented to, Place, Time, Situation (pt giving non sensical answers) Current Attention Level: Focused Memory: Decreased recall of precautions, Decreased short-term memory Following Commands: Follows one step commands with increased time, Follows one step commands inconsistently Safety/Judgement: Decreased awareness of safety, Decreased awareness of deficits Awareness: Intellectual Problem Solving: Slow processing, Requires verbal cues, Requires tactile cues, Decreased initiation General Comments: pt with difficulty sustaining alertness in session, frequently closing eyes. Pt with slowed processing and needing repitive simple cues to process basic mobility tasks Difficult to assess due to: Intubated  Blood pressure (!) 151/98, pulse 88, temperature 98.4 F (36.9 C), temperature source Oral, resp.  rate 16, height 5\' 2"  (1.575 m), weight 69.8 kg, SpO2 98 %. Physical Exam  General: Alert and oriented x 3, No apparent distress HEENT: Head is normocephalic, atraumatic, PERRLA, EOMI, sclera anicteric, oral mucosa pink and moist, dentition intact, ext ear canals clear,  Neck: Supple without JVD or lymphadenopathy Heart: Reg rate and rhythm. No murmurs rubs or gallops Chest: CTA bilaterally without wheezes, rales, or rhonchi; no distress Abdomen: Soft, non-tender, non-distended, bowel sounds positive. Extremities: No clubbing, cyanosis, or edema. Pulses are 2+ Skin: Clean and intact without signs of breakdown Neuro: Patient is lethargic but arousable.  She was able to tell me her name with some perseveration.  Can follow simple commands intermittently.  Overall exam limited due to participation. Right sided facial droop. LUE 4/5 LLE: 3/5 RUE: 3/5 RLE: 2/5 HF, KE, 3/5 DF, PF Psych: Pt's affect is flat. Pt is cooperative    Results for orders placed or performed during the hospital encounter of 10/15/19 (from the past 24 hour(s))  Glucose, capillary     Status: Abnormal   Collection Time: 10/25/19 11:32 AM  Result Value Ref Range   Glucose-Capillary 330 (H) 70 - 99 mg/dL  Glucose, capillary     Status: Abnormal   Collection Time: 10/25/19  4:18 PM  Result Value Ref Range   Glucose-Capillary 160 (H) 70 - 99 mg/dL  Basic metabolic panel     Status: Abnormal   Collection Time: 10/25/19  8:55 PM  Result Value Ref Range   Sodium 148 (H) 135 - 145 mmol/L   Potassium 4.0 3.5 - 5.1 mmol/L   Chloride 117 (H) 98 - 111 mmol/L   CO2 20 (L) 22 - 32 mmol/L   Glucose, Bld 331 (H) 70 - 99 mg/dL   BUN 36 (H) 6 - 20 mg/dL   Creatinine, Ser 6.041.03 (H) 0.44 - 1.00 mg/dL   Calcium 8.5 (L) 8.9 - 10.3 mg/dL   GFR calc non Af Amer >60 >60 mL/min   GFR calc Af Amer >60 >60 mL/min   Anion gap 11 5 - 15  Glucose, capillary     Status: Abnormal   Collection Time: 10/25/19  9:28 PM  Result Value Ref Range    Glucose-Capillary 252 (H) 70 - 99 mg/dL  CBC     Status: Abnormal   Collection Time: 10/26/19  3:02 AM  Result Value Ref Range   WBC 14.5 (H) 4.0 - 10.5 K/uL   RBC 4.72 3.87 - 5.11 MIL/uL   Hemoglobin 12.6 12.0 - 15.0 g/dL   HCT 54.041.3 36 - 46 %   MCV 87.5 80.0 - 100.0 fL   MCH 26.7 26.0 - 34.0 pg   MCHC 30.5 30.0 - 36.0 g/dL   RDW 98.114.1 19.111.5 - 47.815.5 %   Platelets 275 150 - 400 K/uL   nRBC 0.0 0.0 - 0.2 %  Basic metabolic panel     Status: Abnormal   Collection Time: 10/26/19  3:02 AM  Result Value Ref Range   Sodium 150 (H) 135 - 145 mmol/L   Potassium 3.7 3.5 - 5.1 mmol/L   Chloride 118 (H) 98 - 111 mmol/L   CO2 22 22 - 32 mmol/L   Glucose, Bld 194 (H) 70 - 99 mg/dL   BUN 28 (H) 6 - 20 mg/dL   Creatinine, Ser 2.950.96 0.44 - 1.00 mg/dL   Calcium 8.4 (L) 8.9 - 10.3 mg/dL   GFR calc non Af Amer >60 >60 mL/min   GFR calc Af Amer >60 >60 mL/min   Anion gap 10 5 - 15  Glucose, capillary     Status: Abnormal   Collection Time: 10/26/19  6:18 AM  Result Value Ref Range   Glucose-Capillary 159 (H) 70 - 99 mg/dL   DG CHEST PORT 1 VIEW  Result Date: 10/24/2019 CLINICAL DATA:  Stroke EXAM: PORTABLE CHEST 1 VIEW COMPARISON:  Radiograph 10/20/2019 FINDINGS: Persistent bandlike opacities in right lung base favoring subsegmental atelectatic change. Additional hazy areas of basilar atelectasis bilaterally. No focal consolidation, pneumothorax, effusion or convincing features of edema. Interval removal of the transesophageal endotracheal tubes. Telemetry leads remain over the chest. High attenuation contrast material is noted in the colon at the level of the splenic flexure. Soft tissues and osseous structures are otherwise unremarkable. IMPRESSION: 1. Interval removal of the transesophageal and endotracheal tubes. 2. Bibasilar atelectasis including more subsegmental atelectasis in the right lung base. Electronically Signed   By: Kreg ShropshirePrice  DeHay M.D.   On: 10/24/2019 23:07      Assessment/Plan: Diagnosis: L thalamic ICH 1. Does the need for close, 24 hr/day medical supervision in concert with the patient's rehab needs make it unreasonable for this patient to be served in a less intensive setting? Yes 2. Co-Morbidities requiring supervision/potential complications: DM. HTN, previous history of aneurysm repair, arthritis, right sided weakness, depressed level of consciousness, impaired speech, headache 3. Due to bladder management, bowel management, safety, skin/wound care, disease management, medication administration, pain management and patient education,  does the patient require 24 hr/day rehab nursing? Yes 4. Does the patient require coordinated care of a physician, rehab nurse, therapy disciplines of PT, OT, SLP to address physical and functional deficits in the context of the above medical diagnosis(es)? Yes Addressing deficits in the following areas: balance, endurance, locomotion, strength, transferring, bowel/bladder control, bathing, dressing, feeding, grooming, toileting, cognition, speech, language and psychosocial support 5. Can the patient actively participate in an intensive therapy program of at least 3 hrs of therapy per day at least 5 days per week? Yes 6. The potential for patient to make measurable gains while on inpatient rehab is excellent 7. Anticipated functional outcomes upon discharge from inpatient rehab are mod assist  with PT, mod assist with OT, modified independent with SLP. 8. Estimated rehab length of stay to reach the above functional goals is: 2-3 weeks 9. Anticipated discharge destination: Home 10. Overall Rehab/Functional Prognosis: good  RECOMMENDATIONS: This patient's condition is appropriate for continued rehabilitative care in the following setting: CIR Patient has agreed to participate in recommended program. Yes Note that insurance prior authorization may be required for reimbursement for recommended care.  Comment: Mrs.  Loveridge would be a good CIR candidate. She has the support of her husband upon discharge. He will likely need additional support given that she is currently total assist. Thank you for this consult. We will continue to follow in Mrs. Mcgaughey's care.   Mcarthur Rossetti Angiulli, PA-C 10/26/2019   I have personally performed a face to face diagnostic evaluation, including, but not limited to relevant history and physical exam findings, of this patient and developed relevant assessment and plan.  Additionally, I have reviewed and concur with the physician assistant's documentation above.  Sula Soda, MD

## 2019-10-27 DIAGNOSIS — I610 Nontraumatic intracerebral hemorrhage in hemisphere, subcortical: Secondary | ICD-10-CM | POA: Diagnosis not present

## 2019-10-27 DIAGNOSIS — G911 Obstructive hydrocephalus: Secondary | ICD-10-CM | POA: Diagnosis not present

## 2019-10-27 DIAGNOSIS — I619 Nontraumatic intracerebral hemorrhage, unspecified: Secondary | ICD-10-CM | POA: Diagnosis not present

## 2019-10-27 DIAGNOSIS — R531 Weakness: Secondary | ICD-10-CM | POA: Diagnosis not present

## 2019-10-27 DIAGNOSIS — I615 Nontraumatic intracerebral hemorrhage, intraventricular: Secondary | ICD-10-CM | POA: Diagnosis not present

## 2019-10-27 DIAGNOSIS — E860 Dehydration: Secondary | ICD-10-CM | POA: Diagnosis not present

## 2019-10-27 DIAGNOSIS — R4182 Altered mental status, unspecified: Secondary | ICD-10-CM | POA: Diagnosis not present

## 2019-10-27 DIAGNOSIS — E875 Hyperkalemia: Secondary | ICD-10-CM

## 2019-10-27 DIAGNOSIS — E876 Hypokalemia: Secondary | ICD-10-CM | POA: Diagnosis not present

## 2019-10-27 LAB — GLUCOSE, CAPILLARY
Glucose-Capillary: 149 mg/dL — ABNORMAL HIGH (ref 70–99)
Glucose-Capillary: 190 mg/dL — ABNORMAL HIGH (ref 70–99)
Glucose-Capillary: 213 mg/dL — ABNORMAL HIGH (ref 70–99)
Glucose-Capillary: 141 mg/dL — ABNORMAL HIGH (ref 70–99)

## 2019-10-27 LAB — CBC
HCT: 41.9 % (ref 36.0–46.0)
Hemoglobin: 13 g/dL (ref 12.0–15.0)
MCH: 27 pg (ref 26.0–34.0)
MCHC: 31 g/dL (ref 30.0–36.0)
MCV: 86.9 fL (ref 80.0–100.0)
Platelets: 275 10*3/uL (ref 150–400)
RBC: 4.82 MIL/uL (ref 3.87–5.11)
RDW: 13.7 % (ref 11.5–15.5)
WBC: 15.9 10*3/uL — ABNORMAL HIGH (ref 4.0–10.5)
nRBC: 0 % (ref 0.0–0.2)

## 2019-10-27 LAB — BASIC METABOLIC PANEL
Anion gap: 10 (ref 5–15)
BUN: 18 mg/dL (ref 6–20)
CO2: 19 mmol/L — ABNORMAL LOW (ref 22–32)
Calcium: 8.2 mg/dL — ABNORMAL LOW (ref 8.9–10.3)
Chloride: 109 mmol/L (ref 98–111)
Creatinine, Ser: 0.69 mg/dL (ref 0.44–1.00)
GFR calc Af Amer: >60 mL/min (ref >60)
GFR calc non Af Amer: >60 mL/min (ref >60)
Glucose, Bld: 167 mg/dL — ABNORMAL HIGH (ref 70–99)
Potassium: 5.7 mmol/L — ABNORMAL HIGH (ref 3.5–5.1)
Sodium: 138 mmol/L (ref 135–145)

## 2019-10-27 MED ORDER — HYDRALAZINE HCL 50 MG PO TABS
100.0000 mg | ORAL_TABLET | Freq: Three times a day (TID) | ORAL | Status: DC
  Administered 2019-10-27 – 2019-11-02 (×16): 100 mg via ORAL
  Filled 2019-10-27 (×17): qty 2

## 2019-10-27 MED ORDER — HYDRALAZINE HCL 20 MG/ML IJ SOLN
10.0000 mg | Freq: Four times a day (QID) | INTRAMUSCULAR | Status: DC | PRN
  Administered 2019-11-09: 10 mg via INTRAVENOUS
  Filled 2019-10-27: qty 1

## 2019-10-27 MED ORDER — SODIUM CHLORIDE 0.45 % IV SOLN: INTRAVENOUS | Status: DC

## 2019-10-27 MED ORDER — SODIUM POLYSTYRENE SULFONATE 15 GM/60ML PO SUSP
30.0000 g | Freq: Once | ORAL | Status: AC
  Administered 2019-10-27: 30 g via ORAL
  Filled 2019-10-27: qty 120

## 2019-10-27 MED ORDER — CLONIDINE HCL 0.1 MG PO TABS
0.1000 mg | ORAL_TABLET | Freq: Two times a day (BID) | ORAL | Status: DC
  Administered 2019-10-27 – 2019-11-02 (×13): 0.1 mg via ORAL
  Filled 2019-10-27 (×13): qty 1

## 2019-10-27 MED ORDER — HYDRALAZINE HCL 50 MG PO TABS
50.0000 mg | ORAL_TABLET | Freq: Once | ORAL | Status: AC
  Administered 2019-10-27: 50 mg via ORAL
  Filled 2019-10-27: qty 1

## 2019-10-27 NOTE — Progress Notes (Signed)
Inpatient Rehabilitation-Admissions Coordinator    I met with pt at the bedside as follow up from PM&R MD consult completed yesterday 10/26/19. Please see formal consult note by Dr. Ranell Patrick for details. Discussed recommended rehab program, including expectations, expected LOS, and expected functional gains with pt who appears interested. With pt permission, I spoke with her husband to further discuss program details and he who confirmed both interest in the program and DC support. AC will begin insurance authorization process for possible admit.   Will update once there has been a determination.   Raechel Ache, OTR/L  Rehab Admissions Coordinator  [phone] 10/27/2019 4:07 PM

## 2019-10-27 NOTE — Progress Notes (Addendum)
[facility]                                                                              Patient Demographics  Latoya Cox, is a 54 y.o. female, DOB - [birth year], QHU:[id number]  Admit date - 10/15/2019   Admitting Physician Caryl Pina, MD  Outpatient Primary MD for the patient is Myrlene Broker, MD  Outpatient specialists:   LOS - 12  days   Medical records reviewed and are as summarized below:    Chief Complaint  Patient presents with  . Code Stroke       Brief summary   Per admitting MD:  WSF:[id number] Medleyis an 54 y.o.femalewith DM, HTN and arthritis,[name] and depressed level of consciousness with garbled speech at home, after complaining of a headacheearlier in the day. Husband told EMS that the patient had been complaining of a headache since the morning, but was with no deficits at that time. At 4:30 PM, she continued to complain about her headache and she also felt sleepy, so she took a nap. Her husband checked on her at 6:30 PM, noted the right sided weakness and depressed level of consciousness, and called EMS. On EMS arrival, they noted the same, as well as right facial droop. She vomited en route and continued to vomit intermittently on arrival to the ED. She was obtunded to somnolent and unable to answer questions intelligibly. STAT CT head was obtained, revealing an ICH originating from the left basal ganglila, extending into the left lateral, third and 4th ventricles, with a small amount of blood in the right lateral ventricle as well. Early stages of hydrocephalus were also noted on CT   Admitted to ICU by Neuro. Seen by NSU-EVD placed Remained in ICU on vent support Weaned and transferred out to Neuro floor with neuro Transferred care to [facility] day 9 on 6/27 2/2 concerns for Hypernatremia 154, AKI 42/1.1 and hyperkalemia - she was transferred to my service on  10/27/2019 on day 12 of hospital care.   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 Intraventricular penetration. Early dilatation of the lateral ventricles.  -MRI head not performed due to previous aneurysm clips -CTA head 6/19 showed no significant residual or recurrent aneurysm.  -CT head 6/24 showed Interval improvement in leftbasal ganglia hematoma. Resolution ofIVH.No hydrocephalus. -Carotid Dopplers unremarkable -2D echo- EF 60 - 65%. No cardiac source of emboli identified. -LDL 186, hemoglobin A1c 7.7 -No antithrombotic prior to admission, now on no antithrombotics -PT evaluation recommended CIR, consult placed  -Still slightly lethargic but able to answer all questions and follow appropriate commands, still has dysphagia and at risk for aspiration, continue supportive care, neurology on board.  Blood pressure goal of SBP less than 140 and diastolic less than 90.  Active problems  Obstructive hydrocephalus s/p EVD -CT head showed worsening obstructive hydrocephalus -Neurosurgery consulted, status post EVD, Dr. Venetia Maxon -ET head 6/24 showed Interval improvement in leftbasal ganglia hematoma.  Resolution ofIVH.No hydrocephalus. -EVD self removed 6/24  Acute respiratory failure with hypoxia -Patient was intubated, followed by CCM -Self extubated on 6/24, tolerating well  Hypernatremia -Improved with half-normal saline.  Hyperkalemia.  Kayexalate and repeat in the morning.    History of aneurysm -Status post aneurysm clip, per husband many years ago -CTA head 6/19, no significant residual or recurrent aneurysm   Hypertensive emergency -BP is in poor control she is on max dose of Norvasc, high-dose Coreg and hydralazine.  For better control I have increased her hydralazine dose, added Catapres and have added as needed IV hydralazine.   Please monitor for bradycardia.  Hyperlipidemia LDL 186, goal less than seventy -will place on statin at discharge  Diabetes mellitus type II, uncontrolled Hemoglobin A1c 7.7, resume outpatient meds at discharge For now continue sliding scale insulin, Levemir  Dysphagia -Due to intubation and stroke -Continue dysphagia 2 diet, nectar thick liquids, speech following -Per RN, much more alert and oriented today, will do SLP evaluation, possibly may advance diet  Leukocytosis -Unclear etiology, no fevers, UA negative on 6/26 -Chest x-ray 6/26 showed bibasilar atelectasis including more subsegmental atelectasis in right lung base, which she is at risk for aspiration due to underlying dysphagia have recommended patient and staff to have her in the chair in the daytime and to make sure all her meals are in chair with feeding assistance aspiration precautions.  Continue to monitor closely.  Currently afebrile and appears nontoxic.  Paroxysmal SVT -Resolved, continue Coreg     Code Status: Full CODE STATUS DVT Prophylaxis:   SCD's Family Communication: Discussed all imaging results, lab results, explained to the patient , husband Shon Hale [phone] detailed message left on 10/27/2019 at 9:22 AM.  Disposition Plan:     Status is: Inpatient  Remains inpatient appropriate because:Inpatient level of care appropriate due to severity of illness   Dispo: The patient is from: Home              Anticipated d/c is to: CIR              Anticipated d/c date is: 1 day              Patient currently medically stable for CIR discharge.      Time Spent in minutes     Procedures:    Consultants:   Neurology Neurosurgery  Antimicrobials:   Anti-infectives (From admission, onward)   None         Medications  Scheduled Meds: .  stroke: mapping our early stages of recovery book   Does not apply Once  . amantadine  100 mg Oral BID  . amLODipine  10 mg Oral Daily  . atorvastatin   40 mg Oral Daily  . carvedilol  25 mg Oral BID WC  . chlorhexidine  15 mL Mouth Rinse BID  . Chlorhexidine Gluconate Cloth  6 each Topical Daily  . docusate  100 mg Oral BID  . enoxaparin (LOVENOX) injection  40 mg Subcutaneous Q24H  . hydrALAZINE  50 mg Oral Q8H  . insulin aspart  0-15 Units Subcutaneous TID WC  . insulin detemir  10 Units Subcutaneous BID  . mouth rinse  15 mL Mouth Rinse q12n4p  . pantoprazole  40 mg Oral QHS  . polyethylene glycol  17 g Oral Daily  . senna-docusate  1 tablet Oral BID  . sodium polystyrene  30 g Oral Once   Continuous Infusions: . sodium chloride 50 mL/hr at 10/27/19 [phone]  PRN Meds:.acetaminophen **OR** [DISCONTINUED] acetaminophen (TYLENOL) oral liquid 160 mg/5 mL **OR** acetaminophen, labetalol   Subjective:   Alivia Cimino was seen and examined today.  She is slightly somnolent but in no distress, she denies any headache chest or abdominal pain, denies any shortness of breath.   Objective:   Vitals:   10/27/19 0338 10/27/19 0400 10/27/19 0500 10/27/19 0835  BP: (!) 178/110   (!) 153/106  Pulse: 87   81  Resp: 17 13 13 13   Temp: 98.8 F (37.1 C)   98.9 F (37.2 C)  TempSrc: Oral   Oral  SpO2: 98%   98%  Weight:      Height:        Intake/Output Summary (Last 24 hours) at 10/27/2019 0913 Last data filed at 10/27/2019 0539 Gross per 24 hour  Intake 1150.04 ml  Output 650 ml  Net 500.04 ml     Wt Readings from Last 3 Encounters:  10/25/19 69.8 kg  10/05/19 78 kg  01/14/19 80.3 kg     Exam  Awake, mildly lethargic but answers all questions, follows all commands, she has bilateral weakness but right weaker than the left Onyx.AT,PERRAL Supple Neck,No JVD, No cervical lymphadenopathy appriciated.  Symmetrical Chest wall movement, Good air movement bilaterally, few crackles RRR,No Gallops, Rubs or new Murmurs, No Parasternal Heave +ve B.Sounds, Abd Soft, No tenderness, No organomegaly appriciated, No rebound - guarding or  rigidity. No Cyanosis, Clubbing or edema, No new Rash or bruise    Data Reviewed:  I have personally reviewed following labs and imaging studies  Micro Results No results found for this or any previous visit (from the past 240 hour(s)).  Radiology Reports CT ANGIO HEAD W OR WO CONTRAST  Result Date: 10/17/2019 CLINICAL DATA:  Intraparenchymal hemorrhage. EXAM: CT ANGIOGRAPHY HEAD TECHNIQUE: Multidetector CT imaging of the head was performed using the standard protocol during bolus administration of intravenous contrast. Multiplanar CT image reconstructions and MIPs were obtained to evaluate the vascular anatomy. CONTRAST:  75mL OMNIPAQUE IOHEXOL 350 MG/ML SOLN COMPARISON:  CT head without contrast 10/16/2019 at 1:48 a.m. FINDINGS: CT HEAD Brain: Hemorrhage centered in the left thalamus is stable in size. Intraventricular blood has increased. Left frontal ventriculostomy catheter is in place. Midline shift is slightly more prominent. Blood is seen in the third and fourth ventricles. No new parenchymal hemorrhage is present. Vascular: Aneurysm clip is again noted. Minimal vascular calcifications are present. No hyperdense vessel is evident. Skull: Craniotomy is noted. Calvarium is otherwise within normal limits. Sinuses: The paranasal sinuses and mastoid air cells are clear. Orbits: The globes and orbits are within normal limits. CTA HEAD Anterior circulation: Atherosclerotic changes are noted within the cavernous internal carotid arteries. No significant stenosis is present. Aneurysm clip is in place. No significant residual recurrent aneurysm is present. The A1 and M1 segments are normal. The anterior communicating artery is patent. MCA bifurcations are intact. ACA and MCA branch vessels are unremarkable. Posterior circulation: The vertebral arteries are codominant. PICA origins are visualized and normal. The basilar artery is normal. Both posterior cerebral arteries originate from basilar tip. The PCA  branch vessels are within normal limits. Venous sinuses: The dural sinuses are patent. The straight sinus and deep cerebral veins patent. Cortical veins are unremarkable. Anatomic variants: None IMPRESSION: 1. Stable size of left thalamic hemorrhage. 2. Increased intraventricular blood. With slight increase in midline shift 3. Left frontal ventriculostomy catheter is in place. 4. No significant residual or recurrent aneurysm. 5. No significant proximal  stenosis, aneurysm, or branch vessel occlusion within the Circle of Willis. Normal CTA of the head. No focal etiology for the hemorrhage. Electronically Signed   By: Marin Robertshristopher  Mattern M.D.   On: 10/17/2019 07:13   CT HEAD WO CONTRAST  Result Date: 10/22/2019 CLINICAL DATA:  Intracranial hemorrhage.  Encephalopathy EXAM: CT HEAD WITHOUT CONTRAST TECHNIQUE: Contiguous axial images were obtained from the base of the skull through the vertex without intravenous contrast. COMPARISON:  CT head 10/17/2019 FINDINGS: Brain: Left medial basal ganglia and thalamic hemorrhage shows interval improvement. No new hemorrhage. No intraventricular hemorrhage is present on today's study. Left ventricular drainage catheter is been removed. No hydrocephalus. Mild midline shift to the right due to the hematoma. Small subdural hygromas along the tentorium bilaterally unchanged. Negative for acute infarct or mass. Vascular: Negative for hyperdense vessel. Aneurysm clip left ophthalmic artery region Skull: Left pterional craniotomy.  No acute skeletal abnormality. Sinuses/Orbits: Mild mucosal edema paranasal sinuses. Negative orbit. Other: None IMPRESSION: Interval improvement in left basal ganglia hematoma. Resolution of intraventricular hemorrhage. Left ventricular drain has been removed.  No hydrocephalus. Electronically Signed   By: Marlan Palauharles  Clark M.D.   On: 10/22/2019 21:06   CT HEAD WO CONTRAST  Result Date: 10/16/2019 CLINICAL DATA:  Follow-up examination for intracranial  hemorrhage. EXAM: CT HEAD WITHOUT CONTRAST TECHNIQUE: Contiguous axial images were obtained from the base of the skull through the vertex without intravenous contrast. COMPARISON:  Prior CT from 10/15/2019. FINDINGS: Brain: Acute intraparenchymal hemorrhage emanating from the left thalamus again seen, not significantly changed in size and morphology as compared to previous exam. This measures 3.4 x 3.1 x 2.0 cm (estimated volume 11 cc). Mildly increased localized edema with trace 5 mm localized left-to-right shift at the septum pellucidum. Associated intraventricular extension with blood seen throughout the ventricular system. Degree of intraventricular blood is similar. Associated obstructive hydrocephalus appears slightly worsened from previous. No other acute intracranial hemorrhage. No acute large vessel territory infarct. No extra-axial fluid collection or visible mass lesion. Vascular: No hyperdense vessel. Aneurysm clip position near the left ICA terminus again noted. Skull: No scalp soft tissue abnormality. Prior left frontal craniotomy. Sinuses/Orbits: Globes and orbital soft tissues within normal limits. Paranasal sinuses and mastoid air cells remain clear. Other: None. IMPRESSION: 1. No significant interval change in size and morphology of acute intraparenchymal hemorrhage emanating from the left thalamus, estimated volume 11 CC. Mildly increased localized edema with trace 5 mm localized left-to-right shift at the septum pellucidum. 2. Associated intraventricular extension with blood throughout the ventricular system, similar to previous. Associated obstructive hydrocephalus appears slightly worsened from previous. 3. No other new acute intracranial abnormality. Electronically Signed   By: Rise MuBenjamin  McClintock M.D.   On: 10/16/2019 02:11   DG CHEST PORT 1 VIEW  Result Date: 10/24/2019 CLINICAL DATA:  Stroke EXAM: PORTABLE CHEST 1 VIEW COMPARISON:  Radiograph 10/20/2019 FINDINGS: Persistent bandlike  opacities in right lung base favoring subsegmental atelectatic change. Additional hazy areas of basilar atelectasis bilaterally. No focal consolidation, pneumothorax, effusion or convincing features of edema. Interval removal of the transesophageal endotracheal tubes. Telemetry leads remain over the chest. High attenuation contrast material is noted in the colon at the level of the splenic flexure. Soft tissues and osseous structures are otherwise unremarkable. IMPRESSION: 1. Interval removal of the transesophageal and endotracheal tubes. 2. Bibasilar atelectasis including more subsegmental atelectasis in the right lung base. Electronically Signed   By: Kreg ShropshirePrice  DeHay M.D.   On: 10/24/2019 23:07   DG CHEST PORT 1  VIEW  Result Date: 10/20/2019 CLINICAL DATA:  Intubated. EXAM: PORTABLE CHEST 1 VIEW COMPARISON:  Chest x-ray dated October 18, 2019. FINDINGS: Unchanged endotracheal and enteric tubes. Stable cardiomediastinal silhouette. Normal pulmonary vascularity. Low lung volumes with mild bibasilar atelectasis. No focal consolidation, pleural effusion, or pneumothorax. No acute osseous abnormality. IMPRESSION: 1. Stable support tubes.  No active disease. Electronically Signed   By: Obie Dredge M.D.   On: 10/20/2019 11:55   DG Chest Port 1 View  Result Date: 10/18/2019 CLINICAL DATA:  Respiratory failure.  Evaluate pneumo. EXAM: PORTABLE CHEST 1 VIEW COMPARISON:  October 16, 2019 FINDINGS: The ETT is in good position. The NG tube terminates below today's film. No pneumothorax. The lungs are clear. Stable cardiomegaly. The hila and mediastinum are unchanged. IMPRESSION: 1. Support apparatus as above. 2. No other acute abnormalities. Electronically Signed   By: Gerome Sam III M.D   On: 10/18/2019 12:05   DG CHEST PORT 1 VIEW  Result Date: 10/16/2019 CLINICAL DATA:  ET tube and OG tube placed EXAM: PORTABLE CHEST 1 VIEW COMPARISON:  June 29, 2016 FINDINGS: The heart size and mediastinal contours are within  normal limits. Probable subsegmental atelectasis seen at the right lung base. ETT is 2.8 cm above the carina. NG tube is seen below the diaphragm within the stomach. The visualized skeletal structures are unremarkable. IMPRESSION: ET tube and NG tube in satisfactory position. Subsegmental atelectasis at the right lung base. Electronically Signed   By: Jonna Clark M.D.   On: 10/16/2019 06:55   DG Swallowing Func-Speech Pathology  Result Date: 10/23/2019 Objective Swallowing Evaluation: Type of Study: MBS-Modified Barium Swallow Study  Patient Details Name: Millicent Blazejewski MRN: [id number] Date of Birth: 1965/10/05 Today's Date: 10/23/2019 Time: SLP Start Time (ACUTE ONLY): 1339 -SLP Stop Time (ACUTE ONLY): 1357 SLP Time Calculation (min) (ACUTE ONLY): 18 min Past Medical History: Past Medical History: Diagnosis Date . Arthritis  . Chest wall pain  . Diabetes mellitus without complication (HCC)  . Hypertension  Past Surgical History: Past Surgical History: Procedure Laterality Date . aneurism repair   . lapband   HPI: Pt with acute headache and slurred speech, brought by EMS to hospital. CT head was obtained, revealing an ICH originating from the left basal ganglila, extending into the left lateral, third and 4th ventricles, with a small amount of blood in the right lateral ventricle as well. Early stages of hydrocephalus were also noted on CT. Acute hypoxia from respiratory failure, requiring intubation on 10/16/19. Extubated 10/21/19.  Subjective: alert, cooperative, needs cues Assessment / Plan / Recommendation CHL IP CLINICAL IMPRESSIONS 10/23/2019 Clinical Impression Pt has a mild oropharyngeal dysphagia. Orally, she has premature spillage of thin liquids and mildly prolonged mastication and transit of solids. Her pharyngeal phase is relatively more functional, but when drinking consecutively via cup or straw, there is trace, silent aspiration of thin liquids that spilled prematurely into the pyriform sinuses. No  aspiration is observed with any other consistency. Also considering her mentation, recommend starting with Dys 2 (chopped) diet and nectar thick liquids with good potential to progress given additional time post-extubation and for cognitive therapy.  SLP Visit Diagnosis Dysphagia, oropharyngeal phase (R13.12) Attention and concentration deficit following -- Frontal lobe and executive function deficit following -- Impact on safety and function Mild aspiration risk   CHL IP TREATMENT RECOMMENDATION 10/23/2019 Treatment Recommendations Therapy as outlined in treatment plan below   Prognosis 10/23/2019 Prognosis for Safe Diet Advancement Good Barriers to Reach Goals Cognitive deficits Barriers/Prognosis  Comment -- CHL IP DIET RECOMMENDATION 10/23/2019 SLP Diet Recommendations Dysphagia 2 (Fine chop) solids;Nectar thick liquid Liquid Administration via Cup;Straw Medication Administration Crushed with puree Compensations Minimize environmental distractions Postural Changes Seated upright at 90 degrees   CHL IP OTHER RECOMMENDATIONS 10/23/2019 Recommended Consults -- Oral Care Recommendations Oral care BID Other Recommendations Order thickener from pharmacy;Prohibited food (jello, ice cream, thin soups);Remove water pitcher   CHL IP FOLLOW UP RECOMMENDATIONS 10/23/2019 Follow up Recommendations Inpatient Rehab   CHL IP FREQUENCY AND DURATION 10/23/2019 Speech Therapy Frequency (ACUTE ONLY) min 2x/week Treatment Duration 2 weeks      CHL IP ORAL PHASE 10/23/2019 Oral Phase Impaired Oral - Pudding Teaspoon -- Oral - Pudding Cup -- Oral - Honey Teaspoon -- Oral - Honey Cup -- Oral - Nectar Teaspoon -- Oral - Nectar Cup WFL Oral - Nectar Straw WFL Oral - Thin Teaspoon -- Oral - Thin Cup Premature spillage Oral - Thin Straw Premature spillage Oral - Puree WFL Oral - Mech Soft Impaired mastication;Delayed oral transit Oral - Regular -- Oral - Multi-Consistency -- Oral - Pill -- Oral Phase - Comment --  CHL IP PHARYNGEAL PHASE 10/23/2019  Pharyngeal Phase Impaired Pharyngeal- Pudding Teaspoon -- Pharyngeal -- Pharyngeal- Pudding Cup -- Pharyngeal -- Pharyngeal- Honey Teaspoon -- Pharyngeal -- Pharyngeal- Honey Cup -- Pharyngeal -- Pharyngeal- Nectar Teaspoon -- Pharyngeal -- Pharyngeal- Nectar Cup WFL Pharyngeal -- Pharyngeal- Nectar Straw WFL Pharyngeal -- Pharyngeal- Thin Teaspoon -- Pharyngeal -- Pharyngeal- Thin Cup Reduced airway/laryngeal closure;Penetration/Aspiration during swallow Pharyngeal Material enters airway, passes BELOW cords without attempt by patient to eject out (silent aspiration) Pharyngeal- Thin Straw Reduced airway/laryngeal closure;Penetration/Aspiration during swallow Pharyngeal Material enters airway, passes BELOW cords without attempt by patient to eject out (silent aspiration) Pharyngeal- Puree WFL Pharyngeal -- Pharyngeal- Mechanical Soft WFL Pharyngeal -- Pharyngeal- Regular -- Pharyngeal -- Pharyngeal- Multi-consistency -- Pharyngeal -- Pharyngeal- Pill -- Pharyngeal -- Pharyngeal Comment --  CHL IP CERVICAL ESOPHAGEAL PHASE 10/23/2019 Cervical Esophageal Phase WFL Pudding Teaspoon -- Pudding Cup -- Honey Teaspoon -- Honey Cup -- Nectar Teaspoon -- Nectar Cup -- Nectar Straw -- Thin Teaspoon -- Thin Cup -- Thin Straw -- Puree -- Mechanical Soft -- Regular -- Multi-consistency -- Pill -- Cervical Esophageal Comment -- Mahala Menghini., M.A. CCC-SLP Acute Rehabilitation Services Pager [phone] Office [phone] 10/23/2019, 3:28 PM              ECHOCARDIOGRAM COMPLETE  Result Date: 10/16/2019    ECHOCARDIOGRAM REPORT   Patient Name:   IDONNA Seats Date of Exam: 10/16/2019 Medical Rec #:  [id number]     Height:       62.0 in Accession #:    [id number]    Weight:       172.0 lb Date of Birth:  [birth year]      BSA:          1.793 m Patient Age:    54 years      BP:           128/62 mmHg Patient Gender: F             HR:           99 bpm. Exam Location:  Inpatient Procedure: 2D Echo, Color Doppler, Cardiac Doppler and  Intracardiac            Opacification Agent Indications:    Stroke i163.9  History:        Patient has no prior history of Echocardiogram examinations.  Risk Factors:Hypertension and Diabetes.  Sonographer:    Irving Burton Senior RDCS Referring Phys: [id number] Marvel Plan  Sonographer Comments: Echo performed with patient supine and on artificial respirator. IMPRESSIONS  1. Left ventricular ejection fraction, by estimation, is 60 to 65%. The left ventricle has normal function. The left ventricle has no regional wall motion abnormalities. There is mild concentric left ventricular hypertrophy. Left ventricular diastolic parameters are consistent with Grade I diastolic dysfunction (impaired relaxation).  2. Right ventricular systolic function is normal. The right ventricular size is normal.  3. The mitral valve is normal in structure. No evidence of mitral valve regurgitation. No evidence of mitral stenosis.  4. The aortic valve is normal in structure. Aortic valve regurgitation is not visualized. No aortic stenosis is present.  5. Aortic dilatation noted. There is borderline dilatation of the aortic root.  6. The inferior vena cava is normal in size with greater than 50% respiratory variability, suggesting right atrial pressure of 3 mmHg. FINDINGS  Left Ventricle: Left ventricular ejection fraction, by estimation, is 60 to 65%. The left ventricle has normal function. The left ventricle has no regional wall motion abnormalities. Definity contrast agent was given IV to delineate the left ventricular  endocardial borders. The left ventricular internal cavity size was normal in size. There is mild concentric left ventricular hypertrophy. Left ventricular diastolic parameters are consistent with Grade I diastolic dysfunction (impaired relaxation). Right Ventricle: The right ventricular size is normal. No increase in right ventricular wall thickness. Right ventricular systolic function is normal. Left Atrium: Left  atrial size was normal in size. Right Atrium: Right atrial size was normal in size. Pericardium: There is no evidence of pericardial effusion. Mitral Valve: The mitral valve is normal in structure. Normal mobility of the mitral valve leaflets. No evidence of mitral valve regurgitation. No evidence of mitral valve stenosis. Tricuspid Valve: The tricuspid valve is normal in structure. Tricuspid valve regurgitation is not demonstrated. No evidence of tricuspid stenosis. Aortic Valve: The aortic valve is normal in structure. Aortic valve regurgitation is not visualized. No aortic stenosis is present. Pulmonic Valve: The pulmonic valve was normal in structure. Pulmonic valve regurgitation is not visualized. No evidence of pulmonic stenosis. Aorta: Aortic dilatation noted. There is borderline dilatation of the aortic root. Venous: The inferior vena cava is normal in size with greater than 50% respiratory variability, suggesting right atrial pressure of 3 mmHg. IAS/Shunts: No atrial level shunt detected by color flow Doppler.  LEFT VENTRICLE PLAX 2D LVIDd:         4.40 cm LVIDs:         2.90 cm LV PW:         1.30 cm LV IVS:        1.20 cm LVOT diam:     2.20 cm LV SV:         49 LV SV Index:   27 LVOT Area:     3.80 cm  RIGHT VENTRICLE RV S prime:     18.30 cm/s LEFT ATRIUM             Index       RIGHT ATRIUM           Index LA diam:        2.90 cm 1.62 cm/m  RA Area:     14.90 cm LA Vol (A2C):   57.1 ml 31.85 ml/m RA Volume:   36.90 ml  20.58 ml/m LA Vol (A4C):   68.0 ml 37.93 ml/m LA Biplane  Vol: 65.0 ml 36.25 ml/m  AORTIC VALVE LVOT Vmax:   89.23 cm/s LVOT Vmean:  58.733 cm/s LVOT VTI:    0.128 m  AORTA Ao Root diam: 3.90 cm Ao Asc diam:  3.40 cm  SHUNTS Systemic VTI:  0.13 m Systemic Diam: 2.20 cm Rachelle Hora Croitoru MD Electronically signed by Thurmon Fair MD Signature Date/Time: 10/16/2019/11:50:40 AM    Final    CT HEAD CODE STROKE WO CONTRAST  Result Date: 10/15/2019 CLINICAL DATA:  Code stroke.  Altered  mental status.  Headache. EXAM: CT HEAD WITHOUT CONTRAST TECHNIQUE: Contiguous axial images were obtained from the base of the skull through the vertex without intravenous contrast. COMPARISON:  Head CT 08/26/2018 FINDINGS: Brain: There is acute parenchymal hemorrhage with the epicenter in the left thalamus. The hematoma measures 3.2 x 2.3 x 2.5 cm (volume = 9.6 cm^3). There is intraventricular penetration with blood filling the third ventricle and nearly filling the fourth ventricle. Small amount in the frontal horns of the lateral ventricles. No surrounding edema at this time. Lateral ventricles are dilated compared to the study of April 2020. Elsewhere, there chronic small-vessel ischemic changes of white matter. No large vessel territory stroke. No sign of mass. No extra-axial collection. 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 Skull: Previous left pterional craniotomy. Sinuses/Orbits: Clear/normal Other: None ASPECTS (Alberta Stroke Program Early CT Score) - Ganglionic level infarction (caudate, lentiform nuclei, internal capsule, insula, M1-M3 cortex): 7 - Supraganglionic infarction (M4-M6 cortex): 3 Total score (0-10 with 10 being normal): 10 IMPRESSION: 1. Acute intraparenchymal hemorrhage in the left thalamus, 9.6 cc. Intraventricular penetration. Early dilatation of the lateral ventricles. 2. ASPECTS is 10 3. These results were communicated to Dr. Otelia Limes at 7:38 pmon 6/17/2021by text page via the [facility] messaging system. Electronically Signed   By: Paulina Fusi M.D.   On: 10/15/2019 19:40   VAS US CAROTID  Result Date: 10/19/2019 Carotid Arterial Duplex Study Indications:       CVA. Risk Factors:      Hypertension, Diabetes. Limitations        Today's exam was limited due to patient positioning. Comparison Study:  no prior Performing Technologist: Blanch Media RVS  Examination Guidelines: A complete evaluation includes B-mode imaging, spectral Doppler, color Doppler,  and power Doppler as needed of all accessible portions of each vessel. Bilateral testing is considered an integral part of a complete examination. Limited examinations for reoccurring indications may be performed as noted.  Right Carotid Findings: +----------+--------+--------+--------+------------------+--------+           PSV cm/sEDV cm/sStenosisPlaque DescriptionComments +----------+--------+--------+--------+------------------+--------+ CCA Distal51      14                                         +----------+--------+--------+--------+------------------+--------+ ICA Prox  52      12      1-39%   heterogenous               +----------+--------+--------+--------+------------------+--------+ ECA       28      7                                          +----------+--------+--------+--------+------------------+--------+ +----------+--------+-------+--------+-------------------+           PSV cm/sEDV cmsDescribeArm Pressure (  mmHG) +----------+--------+-------+--------+-------------------+ [id number]                                         +----------+--------+-------+--------+-------------------+ +---------+--------+--------+--------------+ VertebralPSV cm/sEDV cm/sNot identified +---------+--------+--------+--------------+  Left Carotid Findings: +----------+--------+--------+--------+------------------+--------+           PSV cm/sEDV cm/sStenosisPlaque DescriptionComments +----------+--------+--------+--------+------------------+--------+ CCA Prox  83      12              heterogenous               +----------+--------+--------+--------+------------------+--------+ CCA Distal54      13              heterogenous               +----------+--------+--------+--------+------------------+--------+ ICA Prox  49      15      1-39%   heterogenous               +----------+--------+--------+--------+------------------+--------+ ICA Distal50      17                                          +----------+--------+--------+--------+------------------+--------+ ECA       60      8                                          +----------+--------+--------+--------+------------------+--------+ +----------+--------+--------+--------+-------------------+           PSV cm/sEDV cm/sDescribeArm Pressure (mmHG) +----------+--------+--------+--------+-------------------+ [id number]                                          +----------+--------+--------+--------+-------------------+ +---------+--------+--+--------+--+---------+ VertebralPSV cm/s44EDV cm/s11Antegrade +---------+--------+--+--------+--+---------+   Summary: Right Carotid: Velocities in the right ICA are consistent with a 1-39% stenosis. Left Carotid: Velocities in the left ICA are consistent with a 1-39% stenosis. Vertebrals: Left vertebral artery demonstrates antegrade flow. Right vertebral             artery was not visualized. *See table(s) above for measurements and observations.  Electronically signed by Delia Heady MD on 10/19/2019 at 1:33:17 PM.    Final     Lab Data:  CBC: Recent Labs  Lab 10/25/19 0257 10/26/19 0302 10/27/19 0307  WBC 15.5* 14.5* 15.9*  HGB 13.4 12.6 13.0  HCT 43.8 41.3 41.9  MCV 87.3 87.5 86.9  PLT 314 275 275   Basic Metabolic Panel: Recent Labs  Lab 10/21/19 0609 10/22/19 2121 10/24/19 0337 10/24/19 1255 10/25/19 0257 10/25/19 2055 10/26/19 0302 10/27/19 0307  NA  --  153*   < > 154* 154* 148* 150* 138  K  --  3.4*   < > 4.6 3.3* 4.0 3.7 5.7*  CL  --  117*   < > 118* 120* 117* 118* 109  CO2  --  22   < > 23 24 20* 22 19*  GLUCOSE  --  166*   < > 237* 113* 331* 194* 167*  BUN  --  51*   < > 50* 42* 36* 28* 18  CREATININE  --  1.15*   < > 1.30* 1.12* 1.03* 0.96  0.69  CALCIUM  --  9.3   < > 9.2 8.8* 8.5* 8.4* 8.2*  MG 2.5* 2.9*  --   --   --   --   --   --   PHOS 4.0  --   --   --   --   --   --   --    < > = values in  this interval not displayed.   GFR: Estimated Creatinine Clearance: 75.3 mL/min (by C-G formula based on SCr of 0.69 mg/dL). Liver Function Tests: No results for input(s): AST, ALT, ALKPHOS, BILITOT, PROT, ALBUMIN in the last 168 hours. No results for input(s): LIPASE, AMYLASE in the last 168 hours. No results for input(s): AMMONIA in the last 168 hours. Coagulation Profile: No results for input(s): INR, PROTIME in the last 168 hours. Cardiac Enzymes: No results for input(s): CKTOTAL, CKMB, CKMBINDEX, TROPONINI in the last 168 hours. BNP (last 3 results) No results for input(s): PROBNP in the last 8760 hours. HbA1C: No results for input(s): HGBA1C in the last 72 hours. CBG: Recent Labs  Lab 10/26/19 0618 10/26/19 1127 10/26/19 1511 10/26/19 2136 10/27/19 0612  GLUCAP 159* 186* 219* 158* 149*   Lipid Profile: No results for input(s): CHOL, HDL, LDLCALC, TRIG, CHOLHDL, LDLDIRECT in the last 72 hours. Thyroid Function Tests: No results for input(s): TSH, T4TOTAL, FREET4, T3FREE, THYROIDAB in the last 72 hours. Anemia Panel: No results for input(s): VITAMINB12, FOLATE, FERRITIN, TIBC, IRON, RETICCTPCT in the last 72 hours. Urine analysis:    Component Value Date/Time   COLORURINE YELLOW 10/24/2019 1618   APPEARANCEUR CLEAR 10/24/2019 1618   LABSPEC 1.029 10/24/2019 1618   PHURINE 5.0 10/24/2019 1618   GLUCOSEU >=500 (A) 10/24/2019 1618   HGBUR NEGATIVE 10/24/2019 1618   BILIRUBINUR NEGATIVE 10/24/2019 1618   KETONESUR 5 (A) 10/24/2019 1618   PROTEINUR 30 (A) 10/24/2019 1618   UROBILINOGEN 0.2 09/04/2014 1850   NITRITE NEGATIVE 10/24/2019 1618   LEUKOCYTESUR TRACE (A) 10/24/2019 1618     Susa Raring M.D. [facility] 10/27/2019, 9:13 AM   Call night coverage person covering after 7pm

## 2019-10-27 NOTE — Progress Notes (Signed)
Occupational Therapy Treatment Patient Details Name: Latoya Cox MRN: [id number] DOB: 1965-08-14 Today's Date: 10/27/2019    History of present illness 54 y.o. female with DM, HTN and arthritis, [name] and depressed level of consciousness with garbled speech at home, after complaining of a headache earlier in the day. STAT CT head was obtained, revealing an ICH originating from the left basal ganglila, extending into the left lateral, third and 4th ventricles, with a small amount of blood in the right lateral ventricle as well. Early stages of hydrocephalus were also noted on CT. Pt underwent L frontal IVC placement and intubation on 6/18. Pt extubated on 6/23.   OT comments  Patient supine in bed and agreeable to OT/PT session.  She follows simple commands with increased time in non distracting environment, but with fatigue becomes inconsistent. Pt requires max assist for bed mobility, min guard to mod assist for static sitting balance, min guard for grooming on BSC and total assist +2 toileting in standing today. Decreased R sided awareness, cueing to maintain grasp on walker and keep eyes open during session.  Will follow. CIR remains appropriate.     Follow Up Recommendations  CIR;Supervision/Assistance - 24 hour    Equipment Recommendations  Other (comment) (TBD at next venue of care )    Recommendations for Other Services      Precautions / Restrictions Precautions Precautions: Fall Restrictions Weight Bearing Restrictions: No       Mobility Bed Mobility Overal bed mobility: Needs Assistance Bed Mobility: Supine to Sit     Supine to sit: HOB elevated;Max assist     General bed mobility comments: able to initate BLE movement and trunk flexion to EOB but requires assist to complete and scoot forward   Transfers Overall transfer level: Needs assistance Equipment used: Rolling walker (2 wheeled) Transfers: Sit  to/from [employer] Sit to Stand: Mod assist;+2 physical assistance;+2 safety/equipment;Max assist Stand pivot transfers: Mod assist;+2 physical assistance;+2 safety/equipment       General transfer comment: using RW with cueing for hand placement and technique (pt preference to pull up on walker), with mod-max assist (increased assist as fatigued) +2 to power up, steady and pivot     Balance Overall balance assessment: Needs assistance Sitting-balance support: No upper extremity supported;Feet supported Sitting balance-Leahy Scale: Poor Sitting balance - Comments: R lateral and posterior lean with mod assist at times, at best min guard for safety  Postural control: Right lateral lean;Posterior lean Standing balance support: Bilateral upper extremity supported;During functional activity Standing balance-Leahy Scale: Poor Standing balance comment: reliant on external support                           ADL either performed or assessed with clinical judgement   ADL Overall ADL's : Needs assistance/impaired     Grooming: Wash/dry face;Min guard;Sitting Grooming Details (indicate cue type and reason): sitting on BSC to wash face with min guard for balance                  Toilet Transfer: Moderate assistance;Maximal assistance;+2 for physical assistance;+2 for safety/equipment;Stand-pivot;RW;BSC Toilet Transfer Details (indicate cue type and reason): to BSC using RW, mod assist +2 initally fading to max assist with fatigue  Toileting- Clothing Manipulation and Hygiene: Total assistance;+2 for physical assistance;+2 for safety/equipment;Sit to/from stand Toileting - Clothing Manipulation Details (indicate cue type and reason): incontinent of BM with  no awareness, requires +2 in standing for hygiene mgmt but tolerated 3 stands for a total of approx 10 minutes      Functional mobility during ADLs: Moderate assistance;Maximal assistance;+2 for physical  assistance;+2 for safety/equipment General ADL Comments: pt with flat affect, fatigues quickly; progressing to toileting in standing with +2 assist      Vision   Additional Comments: continue to assess    Perception     Praxis      Cognition Arousal/Alertness: Lethargic;Awake/alert Behavior During Therapy: Flat affect Overall Cognitive Status: Impaired/Different from baseline Area of Impairment: Orientation;Attention;Memory;Following commands;Safety/judgement;Awareness;Problem solving                 Orientation Level: Disoriented to;Time (did not assess place/situation) Current Attention Level: Sustained Memory: Decreased recall of precautions;Decreased short-term memory Following Commands: Follows one step commands inconsistently;Follows one step commands with increased time Safety/Judgement: Decreased awareness of safety;Decreased awareness of deficits Awareness: Intellectual Problem Solving: Slow processing;Decreased initiation;Difficulty sequencing;Requires verbal cues;Requires tactile cues General Comments: pt with slow processing and flat affect, follows simple commands with increased time but fatigues easily; closes eyes during session and requires cueing to maintain open        Exercises     Shoulder Instructions       General Comments family present during session, cueing to maintain grasp of R hand on walker     Pertinent Vitals/ Pain       Pain Assessment: Faces Faces Pain Scale: No hurt  Home Living                                          Prior Functioning/Environment              Frequency  Min 2X/week        Progress Toward Goals  OT Goals(current goals can now be found in the care plan section)  Progress towards OT goals: Progressing toward goals     Plan Discharge plan remains appropriate;Frequency remains appropriate    Co-evaluation    PT/OT/SLP Co-Evaluation/Treatment: Yes Reason for Co-Treatment: For  patient/therapist safety;To address functional/ADL transfers   OT goals addressed during session: ADL's and self-care      AM-PAC OT "6 Clicks" Daily Activity     Outcome Measure   Help from another person eating meals?: Total Help from another person taking care of personal grooming?: A Lot Help from another person toileting, which includes using toliet, bedpan, or urinal?: Total Help from another person bathing (including washing, rinsing, drying)?: Total Help from another person to put on and taking off regular upper body clothing?: Total Help from another person to put on and taking off regular lower body clothing?: Total 6 Click Score: 7    End of Session Equipment Utilized During Treatment: Rolling walker;Gait belt  OT Visit Diagnosis: Other abnormalities of gait and mobility (R26.89);Hemiplegia and hemiparesis;Other symptoms and signs involving the nervous system (R29.898) Hemiplegia - Right/Left: Right Hemiplegia - dominant/non-dominant: Dominant Hemiplegia - caused by: Nontraumatic intracerebral hemorrhage   Activity Tolerance Patient limited by fatigue   Patient Left with call bell/phone within reach;in chair;with chair alarm set;with family/visitor present   Nurse Communication Mobility status;Precautions;Other (comment) (purewick)        Time: 8502-7741 OT Time Calculation (min): 38 min  Charges: OT General Charges $OT Visit: 1 Visit OT Treatments $Self Care/Home Management : 23-37 mins  Latoya Cox B, OT Acute  Rehabilitation Services Pager [phone] Office [phone]    Latoya Cox 10/27/2019, 12:56 PM

## 2019-10-27 NOTE — Progress Notes (Signed)
Physical Therapy Treatment Patient Details Name: Latoya Cox MRN: [id number] DOB: 20-Aug-1965 Today's Date: 10/27/2019    History of Present Illness 54 y.o. female with DM, HTN and arthritis, [name] and depressed level of consciousness with garbled speech at home, after complaining of a headache earlier in the day. STAT CT head was obtained, revealing an ICH originating from the left basal ganglila, extending into the left lateral, third and 4th ventricles, with a small amount of blood in the right lateral ventricle as well. Early stages of hydrocephalus were also noted on CT. Pt underwent L frontal IVC placement and intubation on 6/18. Pt extubated on 6/23.    PT Comments    Pt continues for flat affect, decreased ability to answer question, make choices or focus any significant length of time on a task.  Emphasis today on standing tasks.  By necessity, pt stood a significant amount of time in a RW during extensive peri care before and after spending time on a BSC.    Follow Up Recommendations  CIR     Equipment Recommendations  Other (comment) (TBA)    Recommendations for Other Services       Precautions / Restrictions Precautions Precautions: Fall    Mobility  Bed Mobility Overal bed mobility: Needs Assistance Bed Mobility: Supine to Sit     Supine to sit: HOB elevated;Max assist     General bed mobility comments: able to initate BLE movement and trunk flexion to EOB but requires assist to complete and scoot forward   Transfers Overall transfer level: Needs assistance Equipment used: Rolling walker (2 wheeled) Transfers: Sit to/from [employer] Sit to Stand: Mod assist;+2 physical assistance;+2 safety/equipment;Max assist Stand pivot transfers: Mod assist;+2 physical assistance;+2 safety/equipment       General transfer comment: using RW with cueing for hand placement and technique (pt  preference to pull up on walker), with mod-max assist (increased assist as fatigued) +2 to power up, steady and pivot   Ambulation/Gait Ambulation/Gait assistance: Mod assist;+2 safety/equipment Gait Distance (Feet): 12 Feet Assistive device: Rolling walker (2 wheeled) Gait Pattern/deviations: Step-through pattern Gait velocity: slow Gait velocity interpretation: <1.31 ft/sec, indicative of household ambulator General Gait Details: slow unsteady wide BOS steps with RW and significant truncal support   Stairs             Wheelchair Mobility    Modified Rankin (Stroke Patients Only) Modified Rankin (Stroke Patients Only) Pre-Morbid Rankin Score: No symptoms Modified Rankin: Moderately severe disability     Balance Overall balance assessment: Needs assistance Sitting-balance support: No upper extremity supported;Feet supported Sitting balance-Leahy Scale: Poor Sitting balance - Comments: R lateral and posterior lean with mod assist at times, at best min guard for safety  Postural control: Right lateral lean;Posterior lean Standing balance support: Bilateral upper extremity supported;During functional activity Standing balance-Leahy Scale: Poor Standing balance comment: reliant on external support  Stood for peri care a significant amount of time                            Cognition Arousal/Alertness: Lethargic;Awake/alert Behavior During Therapy: Flat affect Overall Cognitive Status: Impaired/Different from baseline Area of Impairment: Orientation;Attention;Memory;Following commands;Safety/judgement;Awareness;Problem solving                 Orientation Level: Disoriented to;Time (did not assess place/situation) Current Attention Level: Sustained Memory: Decreased recall of precautions;Decreased short-term memory Following Commands:  Follows one step commands inconsistently;Follows one step commands with increased time Safety/Judgement: Decreased  awareness of safety;Decreased awareness of deficits Awareness: Intellectual Problem Solving: Slow processing;Decreased initiation;Difficulty sequencing;Requires verbal cues;Requires tactile cues General Comments: pt with slow processing and flat affect, follows simple commands with increased time but fatigues easily; closes eyes during session and requires cueing to maintain open      Exercises Other Exercises Other Exercises: hip/knee flex/ext with graded resistance in extension.    General Comments General comments (skin integrity, edema, etc.): family present during session, cueing to maintain grasp of R hand on walker       Pertinent Vitals/Pain Pain Assessment: Faces Faces Pain Scale: No hurt    Home Living                      Prior Function            PT Goals (current goals can now be found in the care plan section) Acute Rehab PT Goals PT Goal Formulation: Patient unable to participate in goal setting Time For Goal Achievement: 10/31/19 Potential to Achieve Goals: Fair Progress towards PT goals: Progressing toward goals    Frequency    Min 4X/week      PT Plan Current plan remains appropriate    Co-evaluation PT/OT/SLP Co-Evaluation/Treatment: Yes Reason for Co-Treatment: For patient/therapist safety PT goals addressed during session: Mobility/safety with mobility;Strengthening/ROM        AM-PAC PT "6 Clicks" Mobility   Outcome Measure  Help needed turning from your back to your side while in a flat bed without using bedrails?: A Lot Help needed moving from lying on your back to sitting on the side of a flat bed without using bedrails?: A Lot Help needed moving to and from a bed to a chair (including a wheelchair)?: A Lot Help needed standing up from a chair using your arms (e.g., wheelchair or bedside chair)?: A Lot Help needed to walk in hospital room?: A Lot Help needed climbing 3-5 steps with a railing? : Total 6 Click Score: 11     End of Session   Activity Tolerance: Patient tolerated treatment well Patient left: in chair;with call bell/phone within reach;with chair alarm set Nurse Communication: Mobility status PT Visit Diagnosis: Other abnormalities of gait and mobility (R26.89)     Time: 3546-5681 PT Time Calculation (min) (ACUTE ONLY): 38 min  Charges:  $Therapeutic Activity: 8-22 mins                     10/27/2019  Jacinto Halim., PT Acute Rehabilitation Services [phone]  (pager) [phone]  (office)   Latoya Cox 10/27/2019, 5:15 PM

## 2019-10-27 NOTE — Plan of Care (Signed)
  Problem: Coping: Goal: Will verbalize positive feelings about self Outcome: Progressing   Problem: Self-Care: Goal: Ability to communicate needs accurately will improve Outcome: Progressing

## 2019-10-28 DIAGNOSIS — J9601 Acute respiratory failure with hypoxia: Secondary | ICD-10-CM | POA: Diagnosis not present

## 2019-10-28 DIAGNOSIS — E876 Hypokalemia: Secondary | ICD-10-CM | POA: Diagnosis not present

## 2019-10-28 DIAGNOSIS — I615 Nontraumatic intracerebral hemorrhage, intraventricular: Secondary | ICD-10-CM | POA: Diagnosis not present

## 2019-10-28 DIAGNOSIS — R400 Somnolence: Secondary | ICD-10-CM | POA: Diagnosis not present

## 2019-10-28 DIAGNOSIS — I610 Nontraumatic intracerebral hemorrhage in hemisphere, subcortical: Secondary | ICD-10-CM | POA: Diagnosis not present

## 2019-10-28 DIAGNOSIS — G911 Obstructive hydrocephalus: Secondary | ICD-10-CM | POA: Diagnosis not present

## 2019-10-28 DIAGNOSIS — E860 Dehydration: Secondary | ICD-10-CM | POA: Diagnosis not present

## 2019-10-28 LAB — CBC
HCT: 39 % (ref 36.0–46.0)
Hemoglobin: 12.3 g/dL (ref 12.0–15.0)
MCH: 26.5 pg (ref 26.0–34.0)
MCHC: 31.5 g/dL (ref 30.0–36.0)
MCV: 84.1 fL (ref 80.0–100.0)
Platelets: 250 10*3/uL (ref 150–400)
RBC: 4.64 MIL/uL (ref 3.87–5.11)
RDW: 13.6 % (ref 11.5–15.5)
WBC: 15.3 10*3/uL — ABNORMAL HIGH (ref 4.0–10.5)
nRBC: 0 % (ref 0.0–0.2)

## 2019-10-28 LAB — BASIC METABOLIC PANEL
Anion gap: 9 (ref 5–15)
BUN: 10 mg/dL (ref 6–20)
CO2: 24 mmol/L (ref 22–32)
Calcium: 8.2 mg/dL — ABNORMAL LOW (ref 8.9–10.3)
Chloride: 106 mmol/L (ref 98–111)
Creatinine, Ser: 0.76 mg/dL (ref 0.44–1.00)
GFR calc Af Amer: >60 mL/min (ref >60)
GFR calc non Af Amer: >60 mL/min (ref >60)
Glucose, Bld: 151 mg/dL — ABNORMAL HIGH (ref 70–99)
Potassium: 2.9 mmol/L — ABNORMAL LOW (ref 3.5–5.1)
Sodium: 139 mmol/L (ref 135–145)

## 2019-10-28 LAB — GLUCOSE, CAPILLARY
Glucose-Capillary: 188 mg/dL — ABNORMAL HIGH (ref 70–99)
Glucose-Capillary: 210 mg/dL — ABNORMAL HIGH (ref 70–99)
Glucose-Capillary: 177 mg/dL — ABNORMAL HIGH (ref 70–99)

## 2019-10-28 LAB — MAGNESIUM: Magnesium: 1.8 mg/dL (ref 1.7–2.4)

## 2019-10-28 MED ORDER — POTASSIUM CHLORIDE CRYS ER 20 MEQ PO TBCR
40.0000 meq | EXTENDED_RELEASE_TABLET | Freq: Once | ORAL | Status: AC
  Administered 2019-10-28: 40 meq via ORAL
  Filled 2019-10-28: qty 2

## 2019-10-28 NOTE — Progress Notes (Signed)
Inpatient Rehabilitation-Admissions Coordinator   Pt's insurance policy appears to be on hold and is limited plan that would not cover CIR here at [facility]. Notified the pt's husband who plans to call his plan to check into why the plan is on hold and clarify benefits. Discussed option of self pay, which pt's husband reports is not an option. Notified TOC team regarding barriers to admit to CIR.   Will continue to follow up with this case and pt's husband for clarification and other rehab options.   Cheri Rous, OTR/L  Rehab Admissions Coordinator  [phone] 10/28/2019 11:32 AM

## 2019-10-28 NOTE — Progress Notes (Signed)
  Speech Language Pathology Treatment: Cognitive-Linquistic  Patient Details Name: Latoya Cox MRN: [id number] DOB: Apr 18, 1966 Today's Date: 10/28/2019 Time: 3614-4315 SLP Time Calculation (min) (ACUTE ONLY): 16 min  Assessment / Plan / Recommendation Clinical Impression  Pt is drowsy and wants to keep her eyes closed throughout session, but still followed one-step commands with Min-Mod cues. She declined most POs verbally, but did take several sips of nectar thick liquids without overt difficulty. Pt's expressive language is limited primarily to the word or short-phrase level given frequent cueing and opportunities to respond. She responds to most open-ended questions with "I don't know" and needed Max cues to complete a simple verbal sequencing task. She will benefit from ongoing SLP f/u to maximize communication, cognition, and swallowing.   HPI HPI: Pt with acute headache and slurred speech, brought by EMS to hospital. CT head was obtained, revealing an ICH originating from the left basal ganglila, extending into the left lateral, third and 4th ventricles, with a small amount of blood in the right lateral ventricle as well. Early stages of hydrocephalus were also noted on CT. Acute hypoxia from respiratory failure, requiring intubation on 10/16/19. Extubated 10/21/19.      SLP Plan  Continue with current plan of care       Recommendations  Diet recommendations: Dysphagia 2 (fine chop);Nectar-thick liquid Liquids provided via: Cup;Straw Medication Administration: Crushed with puree Supervision: Staff to assist with self feeding;Full supervision/cueing for compensatory strategies Compensations: Minimize environmental distractions Postural Changes and/or Swallow Maneuvers: Seated upright 90 degrees                Oral Care Recommendations: Oral care BID Follow up Recommendations: Inpatient Rehab SLP Visit Diagnosis: Aphasia (R47.01) Plan: Continue with current plan of  care       GO                Mahala Menghini., M.A. CCC-SLP Acute Rehabilitation Services Pager [phone] Office [phone]  10/28/2019, 3:47 PM

## 2019-10-28 NOTE — Progress Notes (Signed)
STROKE TEAM PROGRESS NOTE   INTERVAL HISTORY No family at bedside. Pt lying in bed, lethargic but arousable. No neuro changes. K 2.9 and still has leukocytosis, but improving, no fever. Pending insurance approval for CIR.   OBJECTIVE Vitals:   10/27/19 2015 10/27/19 2300 10/28/19 0300 10/28/19 0751  BP: (!) 145/96 (!) 141/99 (!) 139/100 133/89  Pulse: 93 93 88 89  Resp: 15 15 13 16   Temp: 99 F (37.2 C) 98.9 F (37.2 C) 99.6 F (37.6 C) 98.8 F (37.1 C)  TempSrc: Oral Oral Axillary Oral  SpO2: 97% 98% 98% 98%  Weight:      Height:       CBC:  Recent Labs  Lab 10/27/19 0307 10/28/19 0442  WBC 15.9* 15.3*  HGB 13.0 12.3  HCT 41.9 39.0  MCV 86.9 84.1  PLT 275 250   Basic Metabolic Panel:  Recent Labs  Lab 10/22/19 2121 10/24/19 0337 10/27/19 0307 10/28/19 0442  NA 153*   < > 138 139  K 3.4*   < > 5.7* 2.9*  CL 117*   < > 109 106  CO2 22   < > 19* 24  GLUCOSE 166*   < > 167* 151*  BUN 51*   < > 18 10  CREATININE 1.15*   < > 0.69 0.76  CALCIUM 9.3   < > 8.2* 8.2*  MG 2.9*  --   --  1.8   < > = values in this interval not displayed.   Lipid Panel:     Component Value Date/Time   CHOL 274 (H) 10/15/2019 1949   TRIG 287 (H) 10/18/2019 0539   HDL 64 10/15/2019 1949   CHOLHDL 4.3 10/15/2019 1949   VLDL 24 10/15/2019 1949   LDLCALC 186 (H) 10/15/2019 1949   HgbA1c:  Lab Results  Component Value Date   HGBA1C 9.1 (H) 10/15/2019   Urine Drug Screen:     Component Value Date/Time   LABOPIA NONE DETECTED 10/16/2019 2204   COCAINSCRNUR NONE DETECTED 10/16/2019 2204   LABBENZ POSITIVE (A) 10/16/2019 2204   AMPHETMU NONE DETECTED 10/16/2019 2204   THCU NONE DETECTED 10/16/2019 2204   LABBARB NONE DETECTED 10/16/2019 2204    Alcohol Level     Component Value Date/Time   ETH <10 10/15/2019 1922    IMAGING past 24h No results found.   PHYSICAL EXAM  General - Well nourished, well developed middle-aged African-American lady, not in  distress  Ophthalmologic - fundi not visualized due to noncooperation.  Cardiovascular - Regular rate and rhythm.  Neuro - lethargic but able to arouse with voice, able to tell me her name and in hospital, but not time. Able to follow simple commands, however, intermittent word salad, still has anomia, but able to repeat sentences.  Pupils are equal reactive to light. Blinking to visual threat bilaterally. No gaze palsy. Right lower facial weakness. Tongue protrusion midline. Left UE 4/5, LLE 3/5, RLE 2/5 proximal and 3/5 distal, RUE 3+/5. DTR 1+ and no babinski. Sensation subjectively symmetrical, coordination grossly intact and gait not tested.    ASSESSMENT/PLAN Ms. Haylyn Halberg is a 54 y.o. female with DM, HTN, previous hx of aneurysm repair and arthritis, presenting with acute onset of right sided weakness and depressed level of consciousness with garbled speech after complaining of a headache earlier in the day.   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 Intraventricular penetration.  Early dilatation of the lateral ventricles.   CT head - No significant interval change in size and morphology of acute intraparenchymal hemorrhage emanating from the left thalamus, estimated volume 11 CC. 5 mm MLS. IVH similar to previous. Associated obstructive hydrocephalus appears slightly worsened from previous.   MRI head - not able to perform due to previous aneurysm clips  CTA Head 6/19 - No significant residual or recurrent aneurysm.   CT Head 6/24 - Interval improvement in left BGa hematoma. Resolution of IVH.No hydrocephalus.  Carotid Doppler - unremarkable  2D Echo - EF 60 - 65%. No cardiac source of emboli identified.  Sars Corona Virus 2  - negative  LDL - 186  HgbA1c - 7.7  UDS - benzodiazepine  VTE prophylaxis - Lovenox 40 mg sq daily   No antithrombotic prior to admission, now on No antithrombotic  due to ICH  Therapy recommendations:  CIR   Disposition:  Pending  Obstructive hydrocephalus s/p EVD  CT repeat showed worsening obstructive hydrocephalus  Neurosurgery on board  Status post EVD [name]  CT Head 6/24 - Interval improvement in left BGa hematoma. Resolution of IVH.No hydrocephalus.   EVD self removed 6/24  Acute Respiratory Failure  Intubated  Self extubation 6/24 - tolerating well  Hypernatremia and AKI  Na 155->154->150->148->138->139  Creatinine 0.92-1.15-0.98-1.30->1.12->0.96->0.69->0.76   On 1/2 NS @ 75 ->100->50  Encourage po intake  Continue monitor BMP  Leukocytosis  WBCs - 9.9->24K->17.9-16.4-14.9-13.3->15.5->14.5->15.9->15.3 (afebrile - 99.6)  UA neg 6/26  CXR 6/26 bibasilar atelectasis  CBC monitoring  History of aneurysm  As per husband, it was many years ago  Status post aneurysm clip  Clip not compatible with MRI due to no detailed information from husband  CTA head 6/19 - No significant residual or recurrent aneurysm.   Hypertensive emergency  Home BP meds: Norvasc ; Coreg ; Apresoline  Treated with cleviprex, now off  BP stable   Continue norvasc 10, coreg 25 bid  hydralazine 25 q8->50 q8->100 q8  New catapress 0.1 bid  . Long-term BP goal normotensive  Hyperlipidemia  Home Lipid lowering medication: none   LDL 186, goal < 70  Now on lipitor 40  Consider statin at discharge  Diabetes type II, uncontrolled  Home diabetic meds: Jardiance ; Actos  Current diabetic meds: levemir  SSI  CBG monitoring  HgbA1c 7.7, goal < 7.0  Close PCP follow up  Dysphagia  Due to intubation the stroke  NPO->tube feeding -> dysphagia 2 nectar thick liquids  On IV fluid 1/2 NS @ 100->50-> off  Speech on board  Other Stroke Risk Factors  Advanced age  Obesity, s/p bariatric sugery, recommend weight loss, diet and exercise as appropriate   Family hx stroke (mother)   Hx of  cardiomyopathy  History aneurysm s/p repair  obstructive sleep apnea per pt report  Other Active Problems  Hypokalemia 3.8->3.4 ->5.6->4.6->3.3->4.0->3.7->5.7 - kayexalate -> 2.9 - supplement   Lethargic and put on amantadine (QTc 411 on 6/24)  Paroxysmal SVT - on home coreg 25 bid  Hospital day # 13  Neurology will sign off. Please call with questions. Pt will follow up with stroke clinic NP at [facility] in about 4 weeks. Thanks for the consult.  Marvel Plan, MD PhD Stroke Neurology 10/28/2019 10:28 AM   To contact Stroke Continuity provider, please refer to [email or web address]. After hours, contact General Neurology

## 2019-10-28 NOTE — Progress Notes (Signed)
Nutrition Follow-up  DOCUMENTATION CODES:   Obesity unspecified  INTERVENTION:  Vital Cuisine shake po BID, each supplement provides 500 kcal and 22 grams of protein  NUTRITION DIAGNOSIS:   Inadequate oral intake related to dysphagia as evidenced by NPO status.  Progressing, pt now on Dysphagia 2 diet with Nectar thick liquids  GOAL:   Patient will meet greater than or equal to 90% of their needs  Progressing.  MONITOR:   Diet advancement, Labs, Weight trends, Skin, I & O's  REASON FOR ASSESSMENT:   Ventilator, Consult Enteral/tube feeding initiation and management  ASSESSMENT:   54 year old female who presented on 6/17 as a code stroke. PMH of DM and poorly controlled HTN. CT revealed ICH.  6/18 - worsening hydrocephalus, s/p EVD, intubated 6/23 - extubated 6/24 - EVD self removed 6/25 - s/p MBS, Dysphagia 2 with nectar thick liquids  Pending transfer to CIR. Pt noted to be lethargic still. PO intake is still poor, ranging from 0-90% intake x last 7 recorded meals (39% average intake). RD will order supplements to aid in increasing protein/calorie intake.   UOP: x24 hours I/O: +1,640.23ml since admit  Labs: K+ 2.9 (L), CBGs 141-213 Medications: colace, Novolog, Levemir, Protonix, Miralax, Klor-con, Senokot-s   Diet Order:   Diet Order            DIET DYS 2 Room service appropriate? Yes; Fluid consistency: Nectar Thick  Diet effective now                 EDUCATION NEEDS:   No education needs have been identified at this time  Skin:  Skin Assessment: Skin Integrity Issues: Skin Integrity Issues:: Incisions Incisions: L head  Last BM:  6/29  Height:   Ht Readings from Last 1 Encounters:  10/25/19 5\' 2"  (1.575 m)    Weight:   Wt Readings from Last 1 Encounters:  10/25/19 69.8 kg    Ideal Body Weight:  50 kg  BMI:  Body mass index is 28.15 kg/m.  Estimated Nutritional Needs:   Kcal:  1800-2000  Protein:  90-110 grams  Fluid:   >/= 1.8 L    10/27/19, MS, RD, LDN RD pager number and weekend/on-call pager number located in [place].

## 2019-10-28 NOTE — Progress Notes (Signed)
[facility]                                                                              Patient Demographics  Latoya Cox, is a 54 y.o. female, DOB - [birth year], ZOX:[id number]  Admit date - 10/15/2019   Admitting Physician Caryl Pina, MD  Outpatient Primary MD for the patient is Myrlene Broker, MD  Outpatient specialists:   LOS - 13  days   Medical records reviewed and are as summarized below:    Chief Complaint  Patient presents with  . Code Stroke       Brief summary   Per admitting MD: WJX:BJYNWG Medleyis an 54 y.o.femalewith DM, HTN and arthritis,[name] and depressed level of consciousness with garbled speech at home, after complaining of a headacheearlier in the day. Husband told EMS that the patient had been complaining of a headache since the morning, but was with no deficits at that time. At 4:30 PM, she continued to complain about her headache and she also felt sleepy, so she took a nap. Her husband checked on her at 6:30 PM, noted the right sided weakness and depressed level of consciousness, and called EMS. On EMS arrival, they noted the same, as well as right facial droop. She vomited en route and continued to vomit intermittently on arrival to the ED. She was obtunded to somnolent and unable to answer questions intelligibly. STAT CT head was obtained, revealing an ICH originating from the left basal ganglila, extending into the left lateral, third and 4th ventricles, with a small amount of blood in the right lateral ventricle as well. Early stages of hydrocephalus were also noted on CT   Admitted to ICU by Neuro. Seen by NSU-EVD placed Remained in ICU on vent support Weaned and transferred out to Neuro floor with neuro Transferred care to [facility] day 9 on 6/27 2/2 concerns for Hypernatremia 154, AKI 42/1.1 and hyperkalemia - resolved at 3.3    Assessment & Plan     Principal problem ICH -L thalamic ICH with IVH s/p EVD, likely hypertensive -CT head showed Acute intraparenchymal hemorrhage in the left thalamus,9.6 cc. Intraventricular penetration. Early dilatation of the lateral ventricles.  -MRI head not performed due to previous aneurysm clips -CTA head 6/19 showed no significant residual or recurrent aneurysm.  -CT head 6/24 showed Interval improvement in leftbasal ganglia hematoma. Resolution ofIVH.No hydrocephalus. -Carotid Dopplers unremarkable -2D echo- EF 60 - 65%. No cardiac source of emboli identified. -LDL 186, hemoglobin A1c 7.7 -No antithrombotic prior to admission, now on no antithrombotics -awaiting insurance approval for inpatient rehab -Neurology following, lethargic but arousable, no new neuro changes.  Per neurology, Dr Roda Shutters will sign off, follow-up outpatient with stroke clinic in about 4 weeks  Active problems  Obstructive hydrocephalus s/p EVD -CT head showed worsening obstructive hydrocephalus -Neurosurgery consulted, status post EVD, Dr. Venetia Maxon -ET head 6/24 showed Interval improvement in leftbasal ganglia hematoma. Resolution ofIVH.No hydrocephalus. -EVD self removed 6/24  Acute respiratory failure with hypoxia -Patient was intubated, followed by CCM -Self extubated on 6/24, tolerating well  Hypernatremia -  Sodium 150, creatinine improved 0.9 -Sodium improving, 139  Hypokalemia -Replaced  History of aneurysm -Status post aneurysm clip, per husband many years ago -CTA head 6/19, no significant residual or recurrent aneurysm   Hypertensive emergency -BP currently improving -Continue Norvasc, Coreg, hydralazine, clonidine 0.1 mg twice daily  -No bradycardia  Hyperlipidemia LDL 186, goal less than seventy -will place on statin at discharge  Diabetes mellitus type II, uncontrolled Hemoglobin A1c 7.7, resume outpatient meds at discharge For now continue sliding scale insulin,  Levemir  Dysphagia -Due to intubation and stroke -Continue dysphagia 2 diet with nectar thick liquids  Leukocytosis -Unclear etiology, no fevers, UA negative on 6/26 -Chest x-ray 6/26 showed bibasilar atelectasis including more subsegmental atelectasis in right lung base -High risk of aspiration, continue precautions, upright in the chair -Currently afebrile, no active signs of infection  Paroxysmal SVT -Resolved, continue Coreg   Code Status: Full CODE STATUS DVT Prophylaxis:   SCD's Family Communication: Discussed all imaging results, lab results, explained to the patient's husband on the phone    Disposition Plan:     Status is: Inpatient  Remains inpatient appropriate because:Inpatient level of care appropriate due to severity of illness   Dispo: The patient is from: Home              Anticipated d/c is to: [facility]              Anticipated d/c date is: 1 day              Patient currently is not medically stable to d/c.  Still cognitive deficits       Time Spent in minutes    Procedures:    Consultants:   Neurology Neurosurgery  Antimicrobials:   Anti-infectives (From admission, onward)   None         Medications  Scheduled Meds: .  stroke: mapping our early stages of recovery book   Does not apply Once  . amantadine  100 mg Oral BID  . amLODipine  10 mg Oral Daily  . atorvastatin  40 mg Oral Daily  . carvedilol  25 mg Oral BID WC  . chlorhexidine  15 mL Mouth Rinse BID  . Chlorhexidine Gluconate Cloth  6 each Topical Daily  . cloNIDine  0.1 mg Oral BID  . docusate  100 mg Oral BID  . enoxaparin (LOVENOX) injection  40 mg Subcutaneous Q24H  . hydrALAZINE  100 mg Oral Q8H  . insulin aspart  0-15 Units Subcutaneous TID WC  . insulin detemir  10 Units Subcutaneous BID  . mouth rinse  15 mL Mouth Rinse q12n4p  . pantoprazole  40 mg Oral QHS  . polyethylene glycol  17 g Oral Daily  . potassium chloride  40 mEq Oral Once  . senna-docusate  1  tablet Oral BID   Continuous Infusions:  PRN Meds:.acetaminophen **OR** [DISCONTINUED] acetaminophen (TYLENOL) oral liquid 160 mg/5 mL **OR** acetaminophen, hydrALAZINE, labetalol      Subjective:   Latoya Cox was seen and examined today.  Still lethargic but arousable.  No acute neuro changes overnight.  No fevers or chills.  Difficult to obtain review of system from the patient.  No chest pain, ongoing abdominal pain or any diarrhea.  Objective:   Vitals:   10/27/19 2300 10/28/19 0300 10/28/19 0751 10/28/19 1146  BP: (!) 141/99 (!) 139/100 133/89 135/89  Pulse: 93 88 89 94  Resp: 15 13 16    Temp: 98.9 F (37.2 C) 99.6 F (  37.6 C) 98.8 F (37.1 C) (!) 97.4 F (36.3 C)  TempSrc: Oral Axillary Oral Oral  SpO2: 98% 98% 98% 100%  Weight:      Height:        Intake/Output Summary (Last 24 hours) at 10/28/2019 1342 Last data filed at 10/28/2019 0825 Gross per 24 hour  Intake 325.18 ml  Output 650 ml  Net -324.82 ml     Wt Readings from Last 3 Encounters:  10/25/19 69.8 kg  10/05/19 78 kg  01/14/19 80.3 kg   Physical Exam  General: Lethargic but arousable and oriented to self and place but not time  Cardiovascular: S1 S2 clear, RRR. No pedal edema b/l  Respiratory: CTAB, no wheezing, rales or rhonchi  Gastrointestinal: Soft, nontender, nondistended, NBS  Ext: no pedal edema bilaterally  Neuro: Follows simple commands, LUE 4/5, LLE 3/5, LUE 4/5, RLE 3/5  Musculoskeletal: No cyanosis, clubbing  Skin: No rashes  Psych: Lethargic but arousable, oriented to self and place but not time   Data Reviewed:  I have personally reviewed following labs and imaging studies  Micro Results No results found for this or any previous visit (from the past 240 hour(s)).  Radiology Reports CT ANGIO HEAD W OR WO CONTRAST  Result Date: 10/17/2019 CLINICAL DATA:  Intraparenchymal hemorrhage. EXAM: CT ANGIOGRAPHY HEAD TECHNIQUE: Multidetector CT imaging of the head was  performed using the standard protocol during bolus administration of intravenous contrast. Multiplanar CT image reconstructions and MIPs were obtained to evaluate the vascular anatomy. CONTRAST:  61mL OMNIPAQUE IOHEXOL 350 MG/ML SOLN COMPARISON:  CT head without contrast 10/16/2019 at 1:48 a.m. FINDINGS: CT HEAD Brain: Hemorrhage centered in the left thalamus is stable in size. Intraventricular blood has increased. Left frontal ventriculostomy catheter is in place. Midline shift is slightly more prominent. Blood is seen in the third and fourth ventricles. No new parenchymal hemorrhage is present. Vascular: Aneurysm clip is again noted. Minimal vascular calcifications are present. No hyperdense vessel is evident. Skull: Craniotomy is noted. Calvarium is otherwise within normal limits. Sinuses: The paranasal sinuses and mastoid air cells are clear. Orbits: The globes and orbits are within normal limits. CTA HEAD Anterior circulation: Atherosclerotic changes are noted within the cavernous internal carotid arteries. No significant stenosis is present. Aneurysm clip is in place. No significant residual recurrent aneurysm is present. The A1 and M1 segments are normal. The anterior communicating artery is patent. MCA bifurcations are intact. ACA and MCA branch vessels are unremarkable. Posterior circulation: The vertebral arteries are codominant. PICA origins are visualized and normal. The basilar artery is normal. Both posterior cerebral arteries originate from basilar tip. The PCA branch vessels are within normal limits. Venous sinuses: The dural sinuses are patent. The straight sinus and deep cerebral veins patent. Cortical veins are unremarkable. Anatomic variants: None IMPRESSION: 1. Stable size of left thalamic hemorrhage. 2. Increased intraventricular blood. With slight increase in midline shift 3. Left frontal ventriculostomy catheter is in place. 4. No significant residual or recurrent aneurysm. 5. No significant  proximal stenosis, aneurysm, or branch vessel occlusion within the Circle of Willis. Normal CTA of the head. No focal etiology for the hemorrhage. Electronically Signed   By: Marin Roberts M.D.   On: 10/17/2019 07:13   CT HEAD WO CONTRAST  Result Date: 10/22/2019 CLINICAL DATA:  Intracranial hemorrhage.  Encephalopathy EXAM: CT HEAD WITHOUT CONTRAST TECHNIQUE: Contiguous axial images were obtained from the base of the skull through the vertex without intravenous contrast. COMPARISON:  CT head 10/17/2019 FINDINGS:  Brain: Left medial basal ganglia and thalamic hemorrhage shows interval improvement. No new hemorrhage. No intraventricular hemorrhage is present on today's study. Left ventricular drainage catheter is been removed. No hydrocephalus. Mild midline shift to the right due to the hematoma. Small subdural hygromas along the tentorium bilaterally unchanged. Negative for acute infarct or mass. Vascular: Negative for hyperdense vessel. Aneurysm clip left ophthalmic artery region Skull: Left pterional craniotomy.  No acute skeletal abnormality. Sinuses/Orbits: Mild mucosal edema paranasal sinuses. Negative orbit. Other: None IMPRESSION: Interval improvement in left basal ganglia hematoma. Resolution of intraventricular hemorrhage. Left ventricular drain has been removed.  No hydrocephalus. Electronically Signed   By: Marlan Palau M.D.   On: 10/22/2019 21:06   CT HEAD WO CONTRAST  Result Date: 10/16/2019 CLINICAL DATA:  Follow-up examination for intracranial hemorrhage. EXAM: CT HEAD WITHOUT CONTRAST TECHNIQUE: Contiguous axial images were obtained from the base of the skull through the vertex without intravenous contrast. COMPARISON:  Prior CT from 10/15/2019. FINDINGS: Brain: Acute intraparenchymal hemorrhage emanating from the left thalamus again seen, not significantly changed in size and morphology as compared to previous exam. This measures 3.4 x 3.1 x 2.0 cm (estimated volume 11 cc). Mildly  increased localized edema with trace 5 mm localized left-to-right shift at the septum pellucidum. Associated intraventricular extension with blood seen throughout the ventricular system. Degree of intraventricular blood is similar. Associated obstructive hydrocephalus appears slightly worsened from previous. No other acute intracranial hemorrhage. No acute large vessel territory infarct. No extra-axial fluid collection or visible mass lesion. Vascular: No hyperdense vessel. Aneurysm clip position near the left ICA terminus again noted. Skull: No scalp soft tissue abnormality. Prior left frontal craniotomy. Sinuses/Orbits: Globes and orbital soft tissues within normal limits. Paranasal sinuses and mastoid air cells remain clear. Other: None. IMPRESSION: 1. No significant interval change in size and morphology of acute intraparenchymal hemorrhage emanating from the left thalamus, estimated volume 11 CC. Mildly increased localized edema with trace 5 mm localized left-to-right shift at the septum pellucidum. 2. Associated intraventricular extension with blood throughout the ventricular system, similar to previous. Associated obstructive hydrocephalus appears slightly worsened from previous. 3. No other new acute intracranial abnormality. Electronically Signed   By: Rise Mu M.D.   On: 10/16/2019 02:11   DG CHEST PORT 1 VIEW  Result Date: 10/24/2019 CLINICAL DATA:  Stroke EXAM: PORTABLE CHEST 1 VIEW COMPARISON:  Radiograph 10/20/2019 FINDINGS: Persistent bandlike opacities in right lung base favoring subsegmental atelectatic change. Additional hazy areas of basilar atelectasis bilaterally. No focal consolidation, pneumothorax, effusion or convincing features of edema. Interval removal of the transesophageal endotracheal tubes. Telemetry leads remain over the chest. High attenuation contrast material is noted in the colon at the level of the splenic flexure. Soft tissues and osseous structures are  otherwise unremarkable. IMPRESSION: 1. Interval removal of the transesophageal and endotracheal tubes. 2. Bibasilar atelectasis including more subsegmental atelectasis in the right lung base. Electronically Signed   By: Kreg Shropshire M.D.   On: 10/24/2019 23:07   DG CHEST PORT 1 VIEW  Result Date: 10/20/2019 CLINICAL DATA:  Intubated. EXAM: PORTABLE CHEST 1 VIEW COMPARISON:  Chest x-ray dated October 18, 2019. FINDINGS: Unchanged endotracheal and enteric tubes. Stable cardiomediastinal silhouette. Normal pulmonary vascularity. Low lung volumes with mild bibasilar atelectasis. No focal consolidation, pleural effusion, or pneumothorax. No acute osseous abnormality. IMPRESSION: 1. Stable support tubes.  No active disease. Electronically Signed   By: Obie Dredge M.D.   On: 10/20/2019 11:55   DG Chest [facility]  Result Date: 10/18/2019 CLINICAL DATA:  Respiratory failure.  Evaluate pneumo. EXAM: PORTABLE CHEST 1 VIEW COMPARISON:  October 16, 2019 FINDINGS: The ETT is in good position. The NG tube terminates below today's film. No pneumothorax. The lungs are clear. Stable cardiomegaly. The hila and mediastinum are unchanged. IMPRESSION: 1. Support apparatus as above. 2. No other acute abnormalities. Electronically Signed   By: Gerome Samavid  Williams III M.D   On: 10/18/2019 12:05   DG CHEST PORT 1 VIEW  Result Date: 10/16/2019 CLINICAL DATA:  ET tube and OG tube placed EXAM: PORTABLE CHEST 1 VIEW COMPARISON:  June 29, 2016 FINDINGS: The heart size and mediastinal contours are within normal limits. Probable subsegmental atelectasis seen at the right lung base. ETT is 2.8 cm above the carina. NG tube is seen below the diaphragm within the stomach. The visualized skeletal structures are unremarkable. IMPRESSION: ET tube and NG tube in satisfactory position. Subsegmental atelectasis at the right lung base. Electronically Signed   By: Jonna ClarkBindu  Avutu M.D.   On: 10/16/2019 06:55   DG Swallowing Func-Speech  Pathology  Result Date: 10/23/2019 Objective Swallowing Evaluation: Type of Study: MBS-Modified Barium Swallow Study  Patient Details Name: Christie Nottinghamureka Marlett MRN: [id number] Date of Birth: 12/12/1965 Today's Date: 10/23/2019 Time: SLP Start Time (ACUTE ONLY): 1339 -SLP Stop Time (ACUTE ONLY): 1357 SLP Time Calculation (min) (ACUTE ONLY): 18 min Past Medical History: Past Medical History: Diagnosis Date . Arthritis  . Chest wall pain  . Diabetes mellitus without complication (HCC)  . Hypertension  Past Surgical History: Past Surgical History: Procedure Laterality Date . aneurism repair   . lapband   HPI: Pt with acute headache and slurred speech, brought by EMS to hospital. CT head was obtained, revealing an ICH originating from the left basal ganglila, extending into the left lateral, third and 4th ventricles, with a small amount of blood in the right lateral ventricle as well. Early stages of hydrocephalus were also noted on CT. Acute hypoxia from respiratory failure, requiring intubation on 10/16/19. Extubated 10/21/19.  Subjective: alert, cooperative, needs cues Assessment / Plan / Recommendation CHL IP CLINICAL IMPRESSIONS 10/23/2019 Clinical Impression Pt has a mild oropharyngeal dysphagia. Orally, she has premature spillage of thin liquids and mildly prolonged mastication and transit of solids. Her pharyngeal phase is relatively more functional, but when drinking consecutively via cup or straw, there is trace, silent aspiration of thin liquids that spilled prematurely into the pyriform sinuses. No aspiration is observed with any other consistency. Also considering her mentation, recommend starting with Dys 2 (chopped) diet and nectar thick liquids with good potential to progress given additional time post-extubation and for cognitive therapy.  SLP Visit Diagnosis Dysphagia, oropharyngeal phase (R13.12) Attention and concentration deficit following -- Frontal lobe and executive function deficit following -- Impact on  safety and function Mild aspiration risk   CHL IP TREATMENT RECOMMENDATION 10/23/2019 Treatment Recommendations Therapy as outlined in treatment plan below   Prognosis 10/23/2019 Prognosis for Safe Diet Advancement Good Barriers to Reach Goals Cognitive deficits Barriers/Prognosis Comment -- CHL IP DIET RECOMMENDATION 10/23/2019 SLP Diet Recommendations Dysphagia 2 (Fine chop) solids;Nectar thick liquid Liquid Administration via Cup;Straw Medication Administration Crushed with puree Compensations Minimize environmental distractions Postural Changes Seated upright at 90 degrees   CHL IP OTHER RECOMMENDATIONS 10/23/2019 Recommended Consults -- Oral Care Recommendations Oral care BID Other Recommendations Order thickener from pharmacy;Prohibited food (jello, ice cream, thin soups);Remove water pitcher   CHL IP FOLLOW UP RECOMMENDATIONS 10/23/2019 Follow up Recommendations [facility]  [facility] FREQUENCY AND DURATION 10/23/2019 Speech Therapy Frequency (ACUTE ONLY) min 2x/week Treatment Duration 2 weeks      CHL IP ORAL PHASE 10/23/2019 Oral Phase Impaired Oral - Pudding Teaspoon -- Oral - Pudding Cup -- Oral - Honey Teaspoon -- Oral - Honey Cup -- Oral - Nectar Teaspoon -- Oral - Nectar Cup WFL Oral - Nectar Straw WFL Oral - Thin Teaspoon -- Oral - Thin Cup Premature spillage Oral - Thin Straw Premature spillage Oral - Puree WFL Oral - Mech Soft Impaired mastication;Delayed oral transit Oral - Regular -- Oral - Multi-Consistency -- Oral - Pill -- Oral Phase - Comment --  CHL IP PHARYNGEAL PHASE 10/23/2019 Pharyngeal Phase Impaired Pharyngeal- Pudding Teaspoon -- Pharyngeal -- Pharyngeal- Pudding Cup -- Pharyngeal -- Pharyngeal- Honey Teaspoon -- Pharyngeal -- Pharyngeal- Honey Cup -- Pharyngeal -- Pharyngeal- Nectar Teaspoon -- Pharyngeal -- Pharyngeal- Nectar Cup WFL Pharyngeal -- Pharyngeal- Nectar Straw WFL Pharyngeal -- Pharyngeal- Thin Teaspoon -- Pharyngeal -- Pharyngeal- Thin Cup Reduced airway/laryngeal  closure;Penetration/Aspiration during swallow Pharyngeal Material enters airway, passes BELOW cords without attempt by patient to eject out (silent aspiration) Pharyngeal- Thin Straw Reduced airway/laryngeal closure;Penetration/Aspiration during swallow Pharyngeal Material enters airway, passes BELOW cords without attempt by patient to eject out (silent aspiration) Pharyngeal- Puree WFL Pharyngeal -- Pharyngeal- Mechanical Soft WFL Pharyngeal -- Pharyngeal- Regular -- Pharyngeal -- Pharyngeal- Multi-consistency -- Pharyngeal -- Pharyngeal- Pill -- Pharyngeal -- Pharyngeal Comment --  CHL IP CERVICAL ESOPHAGEAL PHASE 10/23/2019 Cervical Esophageal Phase WFL Pudding Teaspoon -- Pudding Cup -- Honey Teaspoon -- Honey Cup -- Nectar Teaspoon -- Nectar Cup -- Nectar Straw -- Thin Teaspoon -- Thin Cup -- Thin Straw -- Puree -- Mechanical Soft -- Regular -- Multi-consistency -- Pill -- Cervical Esophageal Comment -- Mahala Menghini., M.A. CCC-SLP Acute Rehabilitation Services Pager [phone] Office [phone] 10/23/2019, 3:28 PM              ECHOCARDIOGRAM COMPLETE  Result Date: 10/16/2019    ECHOCARDIOGRAM REPORT   Patient Name:   MCKYNZIE Harvell Date of Exam: 10/16/2019 Medical Rec #:  [id number]     Height:       62.0 in Accession #:    [id number]    Weight:       172.0 lb Date of Birth:  July 20, 1965      BSA:          1.793 m Patient Age:    54 years      BP:           128/62 mmHg Patient Gender: F             HR:           99 bpm. Exam Location:  Inpatient Procedure: 2D Echo, Color Doppler, Cardiac Doppler and Intracardiac            Opacification Agent Indications:    Stroke i163.9  History:        Patient has no prior history of Echocardiogram examinations.                 Risk Factors:Hypertension and Diabetes.  Sonographer:    Irving Burton Senior RDCS Referring Phys: [id number] Marvel Plan  Sonographer Comments: Echo performed with patient supine and on artificial respirator. IMPRESSIONS  1. Left ventricular ejection fraction, by  estimation, is 60 to 65%. The left ventricle has normal function. The left ventricle has no regional wall motion abnormalities. There is mild concentric left ventricular hypertrophy. Left ventricular diastolic parameters are consistent with Grade  I diastolic dysfunction (impaired relaxation).  2. Right ventricular systolic function is normal. The right ventricular size is normal.  3. The mitral valve is normal in structure. No evidence of mitral valve regurgitation. No evidence of mitral stenosis.  4. The aortic valve is normal in structure. Aortic valve regurgitation is not visualized. No aortic stenosis is present.  5. Aortic dilatation noted. There is borderline dilatation of the aortic root.  6. The inferior vena cava is normal in size with greater than 50% respiratory variability, suggesting right atrial pressure of 3 mmHg. FINDINGS  Left Ventricle: Left ventricular ejection fraction, by estimation, is 60 to 65%. The left ventricle has normal function. The left ventricle has no regional wall motion abnormalities. Definity contrast agent was given IV to delineate the left ventricular  endocardial borders. The left ventricular internal cavity size was normal in size. There is mild concentric left ventricular hypertrophy. Left ventricular diastolic parameters are consistent with Grade I diastolic dysfunction (impaired relaxation). Right Ventricle: The right ventricular size is normal. No increase in right ventricular wall thickness. Right ventricular systolic function is normal. Left Atrium: Left atrial size was normal in size. Right Atrium: Right atrial size was normal in size. Pericardium: There is no evidence of pericardial effusion. Mitral Valve: The mitral valve is normal in structure. Normal mobility of the mitral valve leaflets. No evidence of mitral valve regurgitation. No evidence of mitral valve stenosis. Tricuspid Valve: The tricuspid valve is normal in structure. Tricuspid valve regurgitation is not  demonstrated. No evidence of tricuspid stenosis. Aortic Valve: The aortic valve is normal in structure. Aortic valve regurgitation is not visualized. No aortic stenosis is present. Pulmonic Valve: The pulmonic valve was normal in structure. Pulmonic valve regurgitation is not visualized. No evidence of pulmonic stenosis. Aorta: Aortic dilatation noted. There is borderline dilatation of the aortic root. Venous: The inferior vena cava is normal in size with greater than 50% respiratory variability, suggesting right atrial pressure of 3 mmHg. IAS/Shunts: No atrial level shunt detected by color flow Doppler.  LEFT VENTRICLE PLAX 2D LVIDd:         4.40 cm LVIDs:         2.90 cm LV PW:         1.30 cm LV IVS:        1.20 cm LVOT diam:     2.20 cm LV SV:         49 LV SV Index:   29 LVOT Area:     3.80 cm  RIGHT VENTRICLE RV S prime:     18.30 cm/s LEFT ATRIUM             Index       RIGHT ATRIUM           Index LA diam:        2.90 cm 1.62 cm/m  RA Area:     14.90 cm LA Vol (A2C):   57.1 ml 31.85 ml/m RA Volume:   36.90 ml  20.58 ml/m LA Vol (A4C):   68.0 ml 37.93 ml/m LA Biplane Vol: 65.0 ml 36.25 ml/m  AORTIC VALVE LVOT Vmax:   89.23 cm/s LVOT Vmean:  58.733 cm/s LVOT VTI:    0.128 m  AORTA Ao Root diam: 3.90 cm Ao Asc diam:  3.40 cm  SHUNTS Systemic VTI:  0.13 m Systemic Diam: 2.20 cm Rachelle Hora Croitoru MD Electronically signed by Thurmon Fair MD Signature Date/Time: 10/16/2019/11:50:40 AM    Final    CT HEAD  CODE STROKE WO CONTRAST  Result Date: 10/15/2019 CLINICAL DATA:  Code stroke.  Altered mental status.  Headache. EXAM: CT HEAD WITHOUT CONTRAST TECHNIQUE: Contiguous axial images were obtained from the base of the skull through the vertex without intravenous contrast. COMPARISON:  Head CT 08/26/2018 FINDINGS: Brain: There is acute parenchymal hemorrhage with the epicenter in the left thalamus. The hematoma measures 3.2 x 2.3 x 2.5 cm (volume = 9.6 cm^3). There is intraventricular penetration with blood  filling the third ventricle and nearly filling the fourth ventricle. Small amount in the frontal horns of the lateral ventricles. No surrounding edema at this time. Lateral ventricles are dilated compared to the study of April 2020. Elsewhere, there chronic small-vessel ischemic changes of white matter. No large vessel territory stroke. No sign of mass. No extra-axial collection. 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 Skull: Previous left pterional craniotomy. Sinuses/Orbits: Clear/normal Other: None ASPECTS (Alberta Stroke Program Early CT Score) - Ganglionic level infarction (caudate, lentiform nuclei, internal capsule, insula, M1-M3 cortex): 7 - Supraganglionic infarction (M4-M6 cortex): 3 Total score (0-10 with 10 being normal): 10 IMPRESSION: 1. Acute intraparenchymal hemorrhage in the left thalamus, 9.6 cc. Intraventricular penetration. Early dilatation of the lateral ventricles. 2. ASPECTS is 10 3. These results were communicated to Dr. Otelia Limes at 7:38 pmon 6/17/2021by text page via the [facility] messaging system. Electronically Signed   By: Paulina Fusi M.D.   On: 10/15/2019 19:40   VAS US CAROTID  Result Date: 10/19/2019 Carotid Arterial Duplex Study Indications:       CVA. Risk Factors:      Hypertension, Diabetes. Limitations        Today's exam was limited due to patient positioning. Comparison Study:  no prior Performing Technologist: Blanch Media RVS  Examination Guidelines: A complete evaluation includes B-mode imaging, spectral Doppler, color Doppler, and power Doppler as needed of all accessible portions of each vessel. Bilateral testing is considered an integral part of a complete examination. Limited examinations for reoccurring indications may be performed as noted.  Right Carotid Findings: +----------+--------+--------+--------+------------------+--------+           PSV cm/sEDV cm/sStenosisPlaque DescriptionComments  +----------+--------+--------+--------+------------------+--------+ CCA Distal51      14                                         +----------+--------+--------+--------+------------------+--------+ ICA Prox  52      12      1-39%   heterogenous               +----------+--------+--------+--------+------------------+--------+ ECA       28      7                                          +----------+--------+--------+--------+------------------+--------+ +----------+--------+-------+--------+-------------------+           PSV cm/sEDV cmsDescribeArm Pressure (mmHG) +----------+--------+-------+--------+-------------------+ [id number]                                         +----------+--------+-------+--------+-------------------+ +---------+--------+--------+--------------+ VertebralPSV cm/sEDV cm/sNot identified +---------+--------+--------+--------------+  Left Carotid Findings: +----------+--------+--------+--------+------------------+--------+           PSV cm/sEDV cm/sStenosisPlaque DescriptionComments +----------+--------+--------+--------+------------------+--------+ CCA Prox  83      12              heterogenous               +----------+--------+--------+--------+------------------+--------+ CCA Distal54      13              heterogenous               +----------+--------+--------+--------+------------------+--------+ ICA Prox  49      15      1-39%   heterogenous               +----------+--------+--------+--------+------------------+--------+ ICA Distal50      17                                         +----------+--------+--------+--------+------------------+--------+ ECA       60      8                                          +----------+--------+--------+--------+------------------+--------+ +----------+--------+--------+--------+-------------------+           PSV cm/sEDV cm/sDescribeArm Pressure (mmHG)  +----------+--------+--------+--------+-------------------+ [id number]                                          +----------+--------+--------+--------+-------------------+ +---------+--------+--+--------+--+---------+ VertebralPSV cm/s44EDV cm/s11Antegrade +---------+--------+--+--------+--+---------+   Summary: Right Carotid: Velocities in the right ICA are consistent with a 1-39% stenosis. Left Carotid: Velocities in the left ICA are consistent with a 1-39% stenosis. Vertebrals: Left vertebral artery demonstrates antegrade flow. Right vertebral             artery was not visualized. *See table(s) above for measurements and observations.  Electronically signed by Delia Heady MD on 10/19/2019 at 1:33:17 PM.    Final     Lab Data:  CBC: Recent Labs  Lab 10/25/19 0257 10/26/19 0302 10/27/19 0307 10/28/19 0442  WBC 15.5* 14.5* 15.9* 15.3*  HGB 13.4 12.6 13.0 12.3  HCT 43.8 41.3 41.9 39.0  MCV 87.3 87.5 86.9 84.1  PLT 314 275 275 250   Basic Metabolic Panel: Recent Labs  Lab 10/22/19 2121 10/24/19 0337 10/25/19 0257 10/25/19 2055 10/26/19 0302 10/27/19 0307 10/28/19 0442  NA 153*   < > 154* 148* 150* 138 139  K 3.4*   < > 3.3* 4.0 3.7 5.7* 2.9*  CL 117*   < > 120* 117* 118* 109 106  CO2 22   < > 24 20* 22 19* 24  GLUCOSE 166*   < > 113* 331* 194* 167* 151*  BUN 51*   < > 42* 36* 28* 18 10  CREATININE 1.15*   < > 1.12* 1.03* 0.96 0.69 0.76  CALCIUM 9.3   < > 8.8* 8.5* 8.4* 8.2* 8.2*  MG 2.9*  --   --   --   --   --  1.8   < > = values in this interval not displayed.   GFR: Estimated Creatinine Clearance: 75.3 mL/min (by C-G formula based on SCr of 0.76 mg/dL). Liver Function Tests: No results for input(s): AST, ALT, ALKPHOS, BILITOT, PROT, ALBUMIN in the last 168 hours. No results for input(s): LIPASE, AMYLASE in the last 168 hours.  No results for input(s): AMMONIA in the last 168 hours. Coagulation Profile: No results for input(s): INR, PROTIME in the last 168  hours. Cardiac Enzymes: No results for input(s): CKTOTAL, CKMB, CKMBINDEX, TROPONINI in the last 168 hours. BNP (last 3 results) No results for input(s): PROBNP in the last 8760 hours. HbA1C: No results for input(s): HGBA1C in the last 72 hours. CBG: Recent Labs  Lab 10/27/19 0612 10/27/19 1231 10/27/19 1551 10/27/19 2100 10/28/19 1149  GLUCAP 149* 190* 213* 141* 188*   Lipid Profile: No results for input(s): CHOL, HDL, LDLCALC, TRIG, CHOLHDL, LDLDIRECT in the last 72 hours. Thyroid Function Tests: No results for input(s): TSH, T4TOTAL, FREET4, T3FREE, THYROIDAB in the last 72 hours. Anemia Panel: No results for input(s): VITAMINB12, FOLATE, FERRITIN, TIBC, IRON, RETICCTPCT in the last 72 hours. Urine analysis:    Component Value Date/Time   COLORURINE YELLOW 10/24/2019 1618   APPEARANCEUR CLEAR 10/24/2019 1618   LABSPEC 1.029 10/24/2019 1618   PHURINE 5.0 10/24/2019 1618   GLUCOSEU >=500 (A) 10/24/2019 1618   HGBUR NEGATIVE 10/24/2019 1618   BILIRUBINUR NEGATIVE 10/24/2019 1618   KETONESUR 5 (A) 10/24/2019 1618   PROTEINUR 30 (A) 10/24/2019 1618   UROBILINOGEN 0.2 09/04/2014 1850   NITRITE NEGATIVE 10/24/2019 1618   LEUKOCYTESUR TRACE (A) 10/24/2019 1618     Chantil Bari M.D. [facility] 10/28/2019, 1:42 PM   Call night coverage person covering after 7pm

## 2019-10-28 NOTE — Progress Notes (Signed)
Physical Therapy Treatment Patient Details Name: Latoya Cox MRN: [id number] DOB: 26-Jan-1966 Today's Date: 10/28/2019    History of Present Illness 54 y.o. female with DM, HTN and arthritis, [name] and depressed level of consciousness with garbled speech at home, after complaining of a headache earlier in the day. STAT CT head was obtained, revealing an ICH originating from the left basal ganglila, extending into the left lateral, third and 4th ventricles, with a small amount of blood in the right lateral ventricle as well. Early stages of hydrocephalus were also noted on CT. Pt underwent L frontal IVC placement and intubation on 6/18. Pt extubated on 6/23.    PT Comments    Pt still having trouble arousing and then having trouble staying alert or focused.  Emphasis on transitions to EOB, sit to stand and short distance ambulation in the RW in a confined space.    Follow Up Recommendations  CIR     Equipment Recommendations  Other (comment) (TBA)    Recommendations for Other Services       Precautions / Restrictions Precautions Precautions: Fall    Mobility  Bed Mobility Overal bed mobility: Needs Assistance Bed Mobility: Supine to Sit     Supine to sit: HOB elevated;Max assist     General bed mobility comments: able to initate BLE movement and trunk flexion to EOB but requires assist to complete and scoot forward   Transfers Overall transfer level: Needs assistance Equipment used: Rolling walker (2 wheeled) Transfers: Sit to/from [employer] Sit to Stand: Mod assist;+2 physical assistance;+2 safety/equipment;Min assist Stand pivot transfers: Mod assist;+2 physical assistance;+2 safety/equipment       General transfer comment: cues for hand placement, assist to come forward more than boost.  Ambulation/Gait Ambulation/Gait assistance: Mod assist;+2 safety/equipment Gait Distance (Feet):  3 Feet (forward/back x2 and 10 feet ) Assistive device: Rolling walker (2 wheeled) Gait Pattern/deviations: Step-through pattern Gait velocity: slow   General Gait Details: slow unsteady wide BOS steps with RW,  truncal stability assist   Stairs             Wheelchair Mobility    Modified Rankin (Stroke Patients Only) Modified Rankin (Stroke Patients Only) Pre-Morbid Rankin Score: No symptoms Modified Rankin: Moderately severe disability     Balance Overall balance assessment: Needs assistance Sitting-balance support: No upper extremity supported;Feet supported Sitting balance-Leahy Scale: Fair (to poor) Sitting balance - Comments: initialposterior lean until pt is aroused if not alert, then pt can maintain balance Postural control: Right lateral lean;Posterior lean Standing balance support: Bilateral upper extremity supported;During functional activity Standing balance-Leahy Scale: Poor Standing balance comment: reliant on external support  Stood for peri care a significant amount of time                            Cognition Arousal/Alertness: Lethargic;Awake/alert Behavior During Therapy: Flat affect Overall Cognitive Status: Impaired/Different from baseline Area of Impairment: Orientation;Attention;Memory;Following commands;Safety/judgement;Awareness;Problem solving                 Orientation Level: Disoriented to;Time (did not assess place/situation) Current Attention Level: Sustained Memory: Decreased recall of precautions;Decreased short-term memory Following Commands: Follows one step commands inconsistently;Follows one step commands with increased time Safety/Judgement: Decreased awareness of safety;Decreased awareness of deficits Awareness: Intellectual Problem Solving: Slow processing;Decreased initiation;Difficulty sequencing;Requires verbal cues;Requires tactile cues General Comments: pt with slow processing and flat affect, follows  simple commands with increased time but fatigues easily; closes eyes during session and requires cueing to maintain open      Exercises      General Comments        Pertinent Vitals/Pain Pain Assessment: Faces Faces Pain Scale: No hurt    Home Living                      Prior Function            PT Goals (current goals can now be found in the care plan section) Acute Rehab PT Goals PT Goal Formulation: Patient unable to participate in goal setting Time For Goal Achievement: 10/31/19 Potential to Achieve Goals: Fair Progress towards PT goals: Progressing toward goals    Frequency    Min 4X/week      PT Plan Current plan remains appropriate    Co-evaluation              AM-PAC PT "6 Clicks" Mobility   Outcome Measure  Help needed turning from your back to your side while in a flat bed without using bedrails?: A Lot Help needed moving from lying on your back to sitting on the side of a flat bed without using bedrails?: A Lot Help needed moving to and from a bed to a chair (including a wheelchair)?: A Lot Help needed standing up from a chair using your arms (e.g., wheelchair or bedside chair)?: A Little Help needed to walk in hospital room?: A Lot Help needed climbing 3-5 steps with a railing? : Total 6 Click Score: 12    End of Session   Activity Tolerance: Patient tolerated treatment well Patient left: in chair;with call bell/phone within reach;with chair alarm set Nurse Communication: Mobility status PT Visit Diagnosis: Other abnormalities of gait and mobility (R26.89);Other symptoms and signs involving the nervous system (R29.898)     Time: 1531-1601 PT Time Calculation (min) (ACUTE ONLY): 30 min  Charges:  $Gait Training: 8-22 mins $Therapeutic Activity: 8-22 mins                     10/28/2019  Jacinto Halim., PT Acute Rehabilitation Services [phone]  (pager) [phone]  (office)   Eliseo Gum Adreona Brand 10/28/2019, 6:21 PM

## 2019-10-29 DIAGNOSIS — E876 Hypokalemia: Secondary | ICD-10-CM | POA: Diagnosis not present

## 2019-10-29 DIAGNOSIS — R400 Somnolence: Secondary | ICD-10-CM | POA: Diagnosis not present

## 2019-10-29 DIAGNOSIS — J9601 Acute respiratory failure with hypoxia: Secondary | ICD-10-CM | POA: Diagnosis not present

## 2019-10-29 DIAGNOSIS — I610 Nontraumatic intracerebral hemorrhage in hemisphere, subcortical: Secondary | ICD-10-CM | POA: Diagnosis not present

## 2019-10-29 LAB — CBC
HCT: 36.5 % (ref 36.0–46.0)
Hemoglobin: 11.4 g/dL — ABNORMAL LOW (ref 12.0–15.0)
MCH: 26.3 pg (ref 26.0–34.0)
MCHC: 31.2 g/dL (ref 30.0–36.0)
MCV: 84.3 fL (ref 80.0–100.0)
Platelets: 217 10*3/uL (ref 150–400)
RBC: 4.33 MIL/uL (ref 3.87–5.11)
RDW: 13.5 % (ref 11.5–15.5)
WBC: 13 10*3/uL — ABNORMAL HIGH (ref 4.0–10.5)
nRBC: 0 % (ref 0.0–0.2)

## 2019-10-29 LAB — GLUCOSE, CAPILLARY
Glucose-Capillary: 147 mg/dL — ABNORMAL HIGH (ref 70–99)
Glucose-Capillary: 227 mg/dL — ABNORMAL HIGH (ref 70–99)
Glucose-Capillary: 215 mg/dL — ABNORMAL HIGH (ref 70–99)
Glucose-Capillary: 161 mg/dL — ABNORMAL HIGH (ref 70–99)

## 2019-10-29 LAB — BASIC METABOLIC PANEL
Anion gap: 9 (ref 5–15)
BUN: 14 mg/dL (ref 6–20)
CO2: 25 mmol/L (ref 22–32)
Calcium: 8.3 mg/dL — ABNORMAL LOW (ref 8.9–10.3)
Chloride: 105 mmol/L (ref 98–111)
Creatinine, Ser: 0.86 mg/dL (ref 0.44–1.00)
GFR calc Af Amer: >60 mL/min (ref >60)
GFR calc non Af Amer: >60 mL/min (ref >60)
Glucose, Bld: 185 mg/dL — ABNORMAL HIGH (ref 70–99)
Potassium: 2.8 mmol/L — ABNORMAL LOW (ref 3.5–5.1)
Sodium: 139 mmol/L (ref 135–145)

## 2019-10-29 LAB — MAGNESIUM: Magnesium: 1.9 mg/dL (ref 1.7–2.4)

## 2019-10-29 MED ORDER — POTASSIUM CHLORIDE CRYS ER 20 MEQ PO TBCR
40.0000 meq | EXTENDED_RELEASE_TABLET | ORAL | Status: AC
  Administered 2019-10-29 (×2): 40 meq via ORAL
  Filled 2019-10-29 (×2): qty 2

## 2019-10-29 NOTE — NC FL2 (Signed)
[place] MEDICAID FL2 LEVEL OF CARE SCREENING TOOL     IDENTIFICATION  Patient Name: Latoya Cox Birthdate: [birth year] Sex: female Admission Date (Current Location): 10/15/2019  [facility] and [place] Number:  Producer, television/film/video and Address:  [facility] [place],  [address] [place] [address]      Provider Number: [id number]  Attending Physician Name and Address:  Cathren Harsh, MD  Relative Name and Phone Number:       Current Level of Care: Hospital Recommended Level of Care: Skilled Nursing Facility Prior Approval Number:    Date Approved/Denied:   PASRR Number:    Discharge Plan: SNF    Current Diagnoses: Patient Active Problem List   Diagnosis Date Noted  . Acute hypoxemic respiratory failure (HCC)   . Altered mental status   . 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 Acne 10/05/2019  . Intractable nausea and vomiting 01/14/2019  . Lactic acidosis 01/14/2019  . QT prolongation 01/14/2019  . Suspected COVID-19 virus infection 12/11/2018  . Hx of completed stroke 08/28/2018  . Hypokalemia 10/18/2017  . Overweight (BMI 25.0-29.9) 05/14/2017  . Routine general medical examination at a health care facility 08/02/2016  . Neck fullness 01/27/2016  . Diabetes mellitus type 2 in obese (HCC) 05/17/2015  . Essential hypertension 05/17/2015  . Vitamin D deficiency 05/17/2015    Orientation RESPIRATION BLADDER Height & Weight     Self, Place  Normal Continent, External catheter Weight: 179 lb 7.3 oz (81.4 kg) Height:  5\' 2"  (157.5 cm)  BEHAVIORAL SYMPTOMS/MOOD NEUROLOGICAL BOWEL NUTRITION STATUS      Continent Diet (See dc summary)  AMBULATORY STATUS COMMUNICATION OF NEEDS Skin   Extensive Assist Verbally Surgical wounds (head, left)                       Personal Care Assistance Level of Assistance  Bathing, Feeding, Dressing Bathing Assistance: Maximum assistance Feeding assistance: Limited  assistance Dressing Assistance: Maximum assistance     Functional Limitations Info  Sight, Speech, Hearing Sight Info: Adequate Hearing Info: Adequate Speech Info: Impaired    SPECIAL CARE FACTORS FREQUENCY  PT (By licensed PT), OT (By licensed OT), Speech therapy     PT Frequency: 5x/week OT Frequency: 5x/week     Speech Therapy Frequency: 3x/week      Contractures Contractures Info: Not present    Additional Factors Info  Code Status, Allergies Code Status Info: Full Allergies Info: Hydrocodone           Current Medications (10/29/2019):  This is the current hospital active medication list Current Facility-Administered Medications  Medication Dose Route Frequency Provider Last Rate Last Admin  .  stroke: mapping our early stages of recovery book   Does not apply Once 12/30/2019, MD      . acetaminophen (TYLENOL) tablet 650 mg  650 mg Oral Q4H PRN Caryl Pina, MD       Or  . acetaminophen (TYLENOL) suppository 650 mg  650 mg Rectal Q4H PRN Caryl Pina, MD   650 mg at 10/22/19 2353  . amantadine (SYMMETREL) 50 MG/5ML solution 100 mg  100 mg Oral BID 10/24/19, MD   100 mg at 10/29/19 0933  . amLODipine (NORVASC) tablet 10 mg  10 mg Oral Daily 12/30/19, MD   10 mg at 10/29/19 0926  . atorvastatin (LIPITOR) tablet 40 mg  40 mg Oral Daily 12/30/19, MD  40 mg at 10/29/19 0926  . carvedilol (COREG) tablet 25 mg  25 mg Oral BID WC Marvel Plan, MD   25 mg at 10/29/19 0927  . chlorhexidine (PERIDEX) 0.12 % solution 15 mL  15 mL Mouth Rinse BID Micki Riley, MD   15 mL at 10/29/19 0941  . Chlorhexidine Gluconate Cloth 2 % PADS 6 each  6 each Topical Daily Micki Riley, MD   6 each at 10/26/19 [phone]  . cloNIDine (CATAPRES) tablet 0.1 mg  0.1 mg Oral BID Leroy Sea, MD   0.1 mg at 10/29/19 0927  . docusate (COLACE) 50 MG/5ML liquid 100 mg  100 mg Oral BID Micki Riley, MD   100 mg at 10/29/19 1607  . enoxaparin (LOVENOX) injection 40 mg  40 mg  Subcutaneous Q24H Annie Main L, NP   40 mg at 10/29/19 0936  . hydrALAZINE (APRESOLINE) injection 10 mg  10 mg Intravenous Q6H PRN Leroy Sea, MD      . hydrALAZINE (APRESOLINE) tablet 100 mg  100 mg Oral Q8H Leroy Sea, MD   100 mg at 10/29/19 0616  . insulin aspart (novoLOG) injection 0-15 Units  0-15 Units Subcutaneous TID WC Eduard Clos, MD   2 Units at 10/29/19 [phone]  . insulin detemir (LEVEMIR) injection 10 Units  10 Units Subcutaneous BID Lorin Glass, MD   10 Units at 10/29/19 [phone]  . labetalol (NORMODYNE) injection 5-20 mg  5-20 mg Intravenous Q2H PRN Marvel Plan, MD      . MEDLINE mouth rinse  15 mL Mouth Rinse q12n4p Micki Riley, MD   15 mL at 10/28/19 1200  . pantoprazole (PROTONIX) EC tablet 40 mg  40 mg Oral QHS Norva Pavlov, RPH   40 mg at 10/28/19 2202  . polyethylene glycol (MIRALAX / GLYCOLAX) packet 17 g  17 g Oral Daily Micki Riley, MD   17 g at 10/29/19 4854  . senna-docusate (Senokot-S) tablet 1 tablet  1 tablet Oral BID Micki Riley, MD   1 tablet at 10/29/19 6270     Discharge Medications: Please see discharge summary for a list of discharge medications.  Relevant Imaging Results:  Relevant Lab Results:   Additional Information ssn: [id number]  Coralyn Helling, LCSW

## 2019-10-29 NOTE — Progress Notes (Signed)
[facility]                                                                              Patient Demographics  Latoya Cox, is a 54 y.o. female, DOB - 11/22/1965, YQI:[id number]  Admit date - 10/15/2019   Admitting Physician Caryl PinaEric Lindzen, MD  Outpatient Primary MD for the patient is Myrlene Brokerrawford, Elizabeth A, MD  Outpatient specialists:   LOS - 14  days   Medical records reviewed and are as summarized below:    Chief Complaint  Patient presents with  . Code Stroke       Brief summary   Per admitting MD: LOV:FIEPPIHPI:Latoya Medleyis an 54 y.o.femalewith DM, HTN and arthritis,[name] and depressed level of consciousness with garbled speech at home, after complaining of a headacheearlier in the day. Husband told EMS that the patient had been complaining of a headache since the morning, but was with no deficits at that time. At 4:30 PM, she continued to complain about her headache and she also felt sleepy, so she took a nap. Her husband checked on her at 6:30 PM, noted the right sided weakness and depressed level of consciousness, and called EMS. On EMS arrival, they noted the same, as well as right facial droop. She vomited en route and continued to vomit intermittently on arrival to the ED. She was obtunded to somnolent and unable to answer questions intelligibly. STAT CT head was obtained, revealing an ICH originating from the left basal ganglila, extending into the left lateral, third and 4th ventricles, with a small amount of blood in the right lateral ventricle as well. Early stages of hydrocephalus were also noted on CT   Admitted to ICU by Neuro. 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 Intraventricular penetration. Early dilatation of the lateral ventricles.  -MRI head not performed due to previous aneurysm clips -CTA head 6/19 showed no significant residual or recurrent aneurysm.  -CT head 6/24 showed Interval improvement in leftbasal ganglia hematoma. Resolution ofIVH.No hydrocephalus. -Carotid Dopplers unremarkable -2D echo- EF 60 - 65%. No cardiac source of emboli identified. -LDL 186, hemoglobin A1c 7.7 -No antithrombotic prior to admission, now on no antithrombotics -awaiting insurance approval for inpatient rehab -Neurology following, lethargic but arousable, no new neuro changes.  Per neurology, Dr Roda ShuttersXu will sign off, follow-up outpatient with stroke clinic in about 4 weeks -No acute complaints, awaiting CIR  Active problems  Obstructive hydrocephalus s/p EVD -CT head showed worsening obstructive hydrocephalus -Neurosurgery consulted, status post EVD, Dr. Venetia MaxonStern -CT head 6/24 showed Interval improvement in leftbasal ganglia hematoma. Resolution ofIVH.No hydrocephalus. -EVD self removed 6/24, no acute issues  Acute respiratory failure with hypoxia -Patient was intubated, self extubated on  6/24 -tolerating well, O2 sats 100% on room air  Hypernatremia -Sodium 150, creatinine improved 0.9 -Sodium improving, 139  Hypokalemia -Replaced p.o. 40 mEq x 2  History of aneurysm -Status post aneurysm clip, per husband many years ago -CTA head 6/19, no significant residual or recurrent aneurysm   Hypertensive emergency -BP currently improving -Continue Norvasc, Coreg, hydralazine, clonidine 0.1 mg twice daily  -No bradycardia  Hyperlipidemia LDL 186, goal less than seventy Placed on statin  Diabetes mellitus type II, uncontrolled Hemoglobin A1c 7.7, resume outpatient meds at discharge For  now continue sliding scale insulin, Levemir  Dysphagia -Due to intubation and stroke -Continue dysphagia 2 diet with nectar thick liquids  Leukocytosis -Unclear etiology, no fevers, UA negative on 6/26 -Chest x-ray 6/26 showed bibasilar atelectasis including more subsegmental atelectasis in right lung base -High risk of aspiration, continue precautions, upright in the chair -Trending down, no fevers, no signs of infection  Paroxysmal SVT -Resolved, continue Coreg   Code Status: Full CODE STATUS DVT Prophylaxis:   SCD's Family Communication: Discussed all imaging results, lab results, explained to the patient's husband on the phone on 6/30  Disposition Plan:     Status is: Inpatient  Remains inpatient appropriate because:Inpatient level of care appropriate due to severity of illness   Dispo: The patient is from: Home              Anticipated d/c is to: CIR              Anticipated d/c date is: 1 day              Patient currently is medically stable to d/c.  Still has cognitive deficits, however now cleared from neurological standpoint to be discharged to CIR when bed available, pending insurance approval      Time Spent in minutes    Procedures:    Consultants:   Neurology Neurosurgery  Antimicrobials:   Anti-infectives (From admission, onward)   None         Medications  Scheduled Meds: .  stroke: mapping our early stages of recovery book   Does not apply Once  . amantadine  100 mg Oral BID  . amLODipine  10 mg Oral Daily  . atorvastatin  40 mg Oral Daily  . carvedilol  25 mg Oral BID WC  . chlorhexidine  15 mL Mouth Rinse BID  . Chlorhexidine Gluconate Cloth  6 each Topical Daily  . cloNIDine  0.1 mg Oral BID  . docusate  100 mg Oral BID  . enoxaparin (LOVENOX) injection  40 mg Subcutaneous Q24H  . hydrALAZINE  100 mg Oral Q8H  . insulin aspart  0-15 Units Subcutaneous TID WC  . insulin detemir  10 Units Subcutaneous BID  . mouth rinse  15  mL Mouth Rinse q12n4p  . pantoprazole  40 mg Oral QHS  . polyethylene glycol  17 g Oral Daily  . senna-docusate  1 tablet Oral BID   Continuous Infusions:  PRN Meds:.acetaminophen **OR** [DISCONTINUED] acetaminophen (TYLENOL) oral liquid 160 mg/5 mL **OR** acetaminophen, hydrALAZINE, labetalol      Subjective:   Latoya Cox was seen and examined today.  Alert and awake, no acute complaints, no acute neuro changes overnight.  No fevers or chills.  Alert and oriented x2, no chest pain, shortness of breath, abdominal pain.    Objective:   Vitals:   10/29/19 0300 10/29/19 0500 10/29/19 0826 10/29/19 1142  BP: (!) 140/94  105/74 91/72  Pulse: 82  83 79  Resp: Temp: 98.8 F (37.1 C)  98.2 F (36.8 C) 98.2 F (36.8 C)  TempSrc: Oral  Oral Oral  SpO2: 97%  98% 100%  Weight:  81.4 kg    Height:        Intake/Output Summary (Last 24 hours) at 10/29/2019 1227 Last data filed at 10/29/2019 0600 Gross per 24 hour  Intake 220 ml  Output 350 ml  Net -130 ml     Wt Readings from Last 3 Encounters:  10/29/19 81.4 kg  10/05/19 78 kg  01/14/19 80.3 kg    Physical Exam  General: Alert and oriented x self and place, NAD  Cardiovascular: S1 S2 clear, RRR. No pedal edema b/l  Respiratory: CTAB, no wheezing, rales or rhonchi  Gastrointestinal: Soft, nontender, nondistended, NBS  Ext: no pedal edema bilaterally  Neuro: Bilateral lower extremity weakness  Musculoskeletal: No cyanosis, clubbing  Skin: No rashes  Psych: Still some cognitive deficits     Data Reviewed:  I have personally reviewed following labs and imaging studies  Micro Results No results found for this or any previous visit (from the past 240 hour(s)).  Radiology Reports CT ANGIO HEAD W OR WO CONTRAST  Result Date: 10/17/2019 CLINICAL DATA:  Intraparenchymal hemorrhage. EXAM: CT ANGIOGRAPHY HEAD TECHNIQUE: Multidetector CT imaging of the head was performed using the standard protocol  during bolus administration of intravenous contrast. Multiplanar CT image reconstructions and MIPs were obtained to evaluate the vascular anatomy. CONTRAST:  75mL OMNIPAQUE IOHEXOL 350 MG/ML SOLN COMPARISON:  CT head without contrast 10/16/2019 at 1:48 a.m. FINDINGS: CT HEAD Brain: Hemorrhage centered in the left thalamus is stable in size. Intraventricular blood has increased. Left frontal ventriculostomy catheter is in place. Midline shift is slightly more prominent. Blood is seen in the third and fourth ventricles. No new parenchymal hemorrhage is present. Vascular: Aneurysm clip is again noted. Minimal vascular calcifications are present. No hyperdense vessel is evident. Skull: Craniotomy is noted. Calvarium is otherwise within normal limits. Sinuses: The paranasal sinuses and mastoid air cells are clear. Orbits: The globes and orbits are within normal limits. CTA HEAD Anterior circulation: Atherosclerotic changes are noted within the cavernous internal carotid arteries. No significant stenosis is present. Aneurysm clip is in place. No significant residual recurrent aneurysm is present. The A1 and M1 segments are normal. The anterior communicating artery is patent. MCA bifurcations are intact. ACA and MCA branch vessels are unremarkable. Posterior circulation: The vertebral arteries are codominant. PICA origins are visualized and normal. The basilar artery is normal. Both posterior cerebral arteries originate from basilar tip. The PCA branch vessels are within normal limits. Venous sinuses: The dural sinuses are patent. The straight sinus and deep cerebral veins patent. Cortical veins are unremarkable. Anatomic variants: None IMPRESSION: 1. Stable size of left thalamic hemorrhage. 2. Increased intraventricular blood. With slight increase in midline shift 3. Left frontal ventriculostomy catheter is in place. 4. No significant residual or recurrent aneurysm. 5. No significant proximal stenosis, aneurysm, or branch  vessel occlusion within the Circle of Willis. Normal CTA of the head. No focal etiology for the hemorrhage. Electronically Signed   By: Marin Roberts M.D.   On: 10/17/2019 07:13   CT HEAD WO CONTRAST  Result Date: 10/22/2019 CLINICAL DATA:  Intracranial hemorrhage.  Encephalopathy EXAM: CT HEAD WITHOUT CONTRAST TECHNIQUE: Contiguous axial images were obtained from the base of the skull through the vertex without intravenous contrast. COMPARISON:  CT head 10/17/2019 FINDINGS: Brain: Left  medial basal ganglia and thalamic hemorrhage shows interval improvement. No new hemorrhage. No intraventricular hemorrhage is present on today's study. Left ventricular drainage catheter is been removed. No hydrocephalus. Mild midline shift to the right due to the hematoma. Small subdural hygromas along the tentorium bilaterally unchanged. Negative for acute infarct or mass. Vascular: Negative for hyperdense vessel. Aneurysm clip left ophthalmic artery region Skull: Left pterional craniotomy.  No acute skeletal abnormality. Sinuses/Orbits: Mild mucosal edema paranasal sinuses. Negative orbit. Other: None IMPRESSION: Interval improvement in left basal ganglia hematoma. Resolution of intraventricular hemorrhage. Left ventricular drain has been removed.  No hydrocephalus. Electronically Signed   By: Marlan Palau M.D.   On: 10/22/2019 21:06   CT HEAD WO CONTRAST  Result Date: 10/16/2019 CLINICAL DATA:  Follow-up examination for intracranial hemorrhage. EXAM: CT HEAD WITHOUT CONTRAST TECHNIQUE: Contiguous axial images were obtained from the base of the skull through the vertex without intravenous contrast. COMPARISON:  Prior CT from 10/15/2019. FINDINGS: Brain: Acute intraparenchymal hemorrhage emanating from the left thalamus again seen, not significantly changed in size and morphology as compared to previous exam. This measures 3.4 x 3.1 x 2.0 cm (estimated volume 11 cc). Mildly increased localized edema with trace 5 mm  localized left-to-right shift at the septum pellucidum. Associated intraventricular extension with blood seen throughout the ventricular system. Degree of intraventricular blood is similar. Associated obstructive hydrocephalus appears slightly worsened from previous. No other acute intracranial hemorrhage. No acute large vessel territory infarct. No extra-axial fluid collection or visible mass lesion. Vascular: No hyperdense vessel. Aneurysm clip position near the left ICA terminus again noted. Skull: No scalp soft tissue abnormality. Prior left frontal craniotomy. Sinuses/Orbits: Globes and orbital soft tissues within normal limits. Paranasal sinuses and mastoid air cells remain clear. Other: None. IMPRESSION: 1. No significant interval change in size and morphology of acute intraparenchymal hemorrhage emanating from the left thalamus, estimated volume 11 CC. Mildly increased localized edema with trace 5 mm localized left-to-right shift at the septum pellucidum. 2. Associated intraventricular extension with blood throughout the ventricular system, similar to previous. Associated obstructive hydrocephalus appears slightly worsened from previous. 3. No other new acute intracranial abnormality. Electronically Signed   By: Rise Mu M.D.   On: 10/16/2019 02:11   DG CHEST PORT 1 VIEW  Result Date: 10/24/2019 CLINICAL DATA:  Stroke EXAM: PORTABLE CHEST 1 VIEW COMPARISON:  Radiograph 10/20/2019 FINDINGS: Persistent bandlike opacities in right lung base favoring subsegmental atelectatic change. Additional hazy areas of basilar atelectasis bilaterally. No focal consolidation, pneumothorax, effusion or convincing features of edema. Interval removal of the transesophageal endotracheal tubes. Telemetry leads remain over the chest. High attenuation contrast material is noted in the colon at the level of the splenic flexure. Soft tissues and osseous structures are otherwise unremarkable. IMPRESSION: 1. Interval  removal of the transesophageal and endotracheal tubes. 2. Bibasilar atelectasis including more subsegmental atelectasis in the right lung base. Electronically Signed   By: Kreg Shropshire M.D.   On: 10/24/2019 23:07   DG CHEST PORT 1 VIEW  Result Date: 10/20/2019 CLINICAL DATA:  Intubated. EXAM: PORTABLE CHEST 1 VIEW COMPARISON:  Chest x-ray dated October 18, 2019. FINDINGS: Unchanged endotracheal and enteric tubes. Stable cardiomediastinal silhouette. Normal pulmonary vascularity. Low lung volumes with mild bibasilar atelectasis. No focal consolidation, pleural effusion, or pneumothorax. No acute osseous abnormality. IMPRESSION: 1. Stable support tubes.  No active disease. Electronically Signed   By: Obie Dredge M.D.   On: 10/20/2019 11:55   DG Chest [facility] 1 View  Result  Date: 10/18/2019 CLINICAL DATA:  Respiratory failure.  Evaluate pneumo. EXAM: PORTABLE CHEST 1 VIEW COMPARISON:  October 16, 2019 FINDINGS: The ETT is in good position. The NG tube terminates below today's film. No pneumothorax. The lungs are clear. Stable cardiomegaly. The hila and mediastinum are unchanged. IMPRESSION: 1. Support apparatus as above. 2. No other acute abnormalities. Electronically Signed   By: Gerome Sam III M.D   On: 10/18/2019 12:05   DG CHEST PORT 1 VIEW  Result Date: 10/16/2019 CLINICAL DATA:  ET tube and OG tube placed EXAM: PORTABLE CHEST 1 VIEW COMPARISON:  June 29, 2016 FINDINGS: The heart size and mediastinal contours are within normal limits. Probable subsegmental atelectasis seen at the right lung base. ETT is 2.8 cm above the carina. NG tube is seen below the diaphragm within the stomach. The visualized skeletal structures are unremarkable. IMPRESSION: ET tube and NG tube in satisfactory position. Subsegmental atelectasis at the right lung base. Electronically Signed   By: Jonna Clark M.D.   On: 10/16/2019 06:55   DG Swallowing Func-Speech Pathology  Result Date: 10/23/2019 Objective Swallowing  Evaluation: Type of Study: MBS-Modified Barium Swallow Study  Patient Details Name: Latoya Cox MRN: [id number] Date of Birth: 03-14-1966 Today's Date: 10/23/2019 Time: SLP Start Time (ACUTE ONLY): 1339 -SLP Stop Time (ACUTE ONLY): 1357 SLP Time Calculation (min) (ACUTE ONLY): 18 min Past Medical History: Past Medical History: Diagnosis Date . Arthritis  . Chest wall pain  . Diabetes mellitus without complication (HCC)  . Hypertension  Past Surgical History: Past Surgical History: Procedure Laterality Date . aneurism repair   . lapband   HPI: Pt with acute headache and slurred speech, brought by EMS to hospital. CT head was obtained, revealing an ICH originating from the left basal ganglila, extending into the left lateral, third and 4th ventricles, with a small amount of blood in the right lateral ventricle as well. Early stages of hydrocephalus were also noted on CT. Acute hypoxia from respiratory failure, requiring intubation on 10/16/19. Extubated 10/21/19.  Subjective: alert, cooperative, needs cues Assessment / Plan / Recommendation CHL IP CLINICAL IMPRESSIONS 10/23/2019 Clinical Impression Pt has a mild oropharyngeal dysphagia. Orally, she has premature spillage of thin liquids and mildly prolonged mastication and transit of solids. Her pharyngeal phase is relatively more functional, but when drinking consecutively via cup or straw, there is trace, silent aspiration of thin liquids that spilled prematurely into the pyriform sinuses. No aspiration is observed with any other consistency. Also considering her mentation, recommend starting with Dys 2 (chopped) diet and nectar thick liquids with good potential to progress given additional time post-extubation and for cognitive therapy.  SLP Visit Diagnosis Dysphagia, oropharyngeal phase (R13.12) Attention and concentration deficit following -- Frontal lobe and executive function deficit following -- Impact on safety and function Mild aspiration risk   CHL IP TREATMENT  RECOMMENDATION 10/23/2019 Treatment Recommendations Therapy as outlined in treatment plan below   Prognosis 10/23/2019 Prognosis for Safe Diet Advancement Good Barriers to Reach Goals Cognitive deficits Barriers/Prognosis Comment -- CHL IP DIET RECOMMENDATION 10/23/2019 SLP Diet Recommendations Dysphagia 2 (Fine chop) solids;Nectar thick liquid Liquid Administration via Cup;Straw Medication Administration Crushed with puree Compensations Minimize environmental distractions Postural Changes Seated upright at 90 degrees   CHL IP OTHER RECOMMENDATIONS 10/23/2019 Recommended Consults -- Oral Care Recommendations Oral care BID Other Recommendations Order thickener from pharmacy;Prohibited food (jello, ice cream, thin soups);Remove water pitcher   CHL IP FOLLOW UP RECOMMENDATIONS 10/23/2019 Follow up Recommendations Inpatient Rehab   CHL IP  FREQUENCY AND DURATION 10/23/2019 Speech Therapy Frequency (ACUTE ONLY) min 2x/week Treatment Duration 2 weeks      CHL IP ORAL PHASE 10/23/2019 Oral Phase Impaired Oral - Pudding Teaspoon -- Oral - Pudding Cup -- Oral - Honey Teaspoon -- Oral - Honey Cup -- Oral - Nectar Teaspoon -- Oral - Nectar Cup WFL Oral - Nectar Straw WFL Oral - Thin Teaspoon -- Oral - Thin Cup Premature spillage Oral - Thin Straw Premature spillage Oral - Puree WFL Oral - Mech Soft Impaired mastication;Delayed oral transit Oral - Regular -- Oral - Multi-Consistency -- Oral - Pill -- Oral Phase - Comment --  CHL IP PHARYNGEAL PHASE 10/23/2019 Pharyngeal Phase Impaired Pharyngeal- Pudding Teaspoon -- Pharyngeal -- Pharyngeal- Pudding Cup -- Pharyngeal -- Pharyngeal- Honey Teaspoon -- Pharyngeal -- Pharyngeal- Honey Cup -- Pharyngeal -- Pharyngeal- Nectar Teaspoon -- Pharyngeal -- Pharyngeal- Nectar Cup WFL Pharyngeal -- Pharyngeal- Nectar Straw WFL Pharyngeal -- Pharyngeal- Thin Teaspoon -- Pharyngeal -- Pharyngeal- Thin Cup Reduced airway/laryngeal closure;Penetration/Aspiration during swallow Pharyngeal Material enters  airway, passes BELOW cords without attempt by patient to eject out (silent aspiration) Pharyngeal- Thin Straw Reduced airway/laryngeal closure;Penetration/Aspiration during swallow Pharyngeal Material enters airway, passes BELOW cords without attempt by patient to eject out (silent aspiration) Pharyngeal- Puree WFL Pharyngeal -- Pharyngeal- Mechanical Soft WFL Pharyngeal -- Pharyngeal- Regular -- Pharyngeal -- Pharyngeal- Multi-consistency -- Pharyngeal -- Pharyngeal- Pill -- Pharyngeal -- Pharyngeal Comment --  CHL IP CERVICAL ESOPHAGEAL PHASE 10/23/2019 Cervical Esophageal Phase WFL Pudding Teaspoon -- Pudding Cup -- Honey Teaspoon -- Honey Cup -- Nectar Teaspoon -- Nectar Cup -- Nectar Straw -- Thin Teaspoon -- Thin Cup -- Thin Straw -- Puree -- Mechanical Soft -- Regular -- Multi-consistency -- Pill -- Cervical Esophageal Comment -- Mahala Menghini., M.A. CCC-SLP Acute Rehabilitation Services Pager [phone] Office [phone] 10/23/2019, 3:28 PM              ECHOCARDIOGRAM COMPLETE  Result Date: 10/16/2019    ECHOCARDIOGRAM REPORT   Patient Name:   Latoya Cox Date of Exam: 10/16/2019 Medical Rec #:  [id number]     Height:       62.0 in Accession #:    [id number]    Weight:       172.0 lb Date of Birth:  1965/08/28      BSA:          1.793 m Patient Age:    54 years      BP:           128/62 mmHg Patient Gender: F             HR:           99 bpm. Exam Location:  Inpatient Procedure: 2D Echo, Color Doppler, Cardiac Doppler and Intracardiac            Opacification Agent Indications:    Stroke i163.9  History:        Patient has no prior history of Echocardiogram examinations.                 Risk Factors:Hypertension and Diabetes.  Sonographer:    Irving Burton Senior RDCS Referring Phys: [id number] Marvel Plan  Sonographer Comments: Echo performed with patient supine and on artificial respirator. IMPRESSIONS  1. Left ventricular ejection fraction, by estimation, is 60 to 65%. The left ventricle has normal function. The  left ventricle has no regional wall motion abnormalities. There is mild concentric left ventricular hypertrophy. Left ventricular diastolic parameters are consistent with Grade I  diastolic dysfunction (impaired relaxation).  2. Right ventricular systolic function is normal. The right ventricular size is normal.  3. The mitral valve is normal in structure. No evidence of mitral valve regurgitation. No evidence of mitral stenosis.  4. The aortic valve is normal in structure. Aortic valve regurgitation is not visualized. No aortic stenosis is present.  5. Aortic dilatation noted. There is borderline dilatation of the aortic root.  6. The inferior vena cava is normal in size with greater than 50% respiratory variability, suggesting right atrial pressure of 3 mmHg. FINDINGS  Left Ventricle: Left ventricular ejection fraction, by estimation, is 60 to 65%. The left ventricle has normal function. The left ventricle has no regional wall motion abnormalities. Definity contrast agent was given IV to delineate the left ventricular  endocardial borders. The left ventricular internal cavity size was normal in size. There is mild concentric left ventricular hypertrophy. Left ventricular diastolic parameters are consistent with Grade I diastolic dysfunction (impaired relaxation). Right Ventricle: The right ventricular size is normal. No increase in right ventricular wall thickness. Right ventricular systolic function is normal. Left Atrium: Left atrial size was normal in size. Right Atrium: Right atrial size was normal in size. Pericardium: There is no evidence of pericardial effusion. Mitral Valve: The mitral valve is normal in structure. Normal mobility of the mitral valve leaflets. No evidence of mitral valve regurgitation. No evidence of mitral valve stenosis. Tricuspid Valve: The tricuspid valve is normal in structure. Tricuspid valve regurgitation is not demonstrated. No evidence of tricuspid stenosis. Aortic Valve: The  aortic valve is normal in structure. Aortic valve regurgitation is not visualized. No aortic stenosis is present. Pulmonic Valve: The pulmonic valve was normal in structure. Pulmonic valve regurgitation is not visualized. No evidence of pulmonic stenosis. Aorta: Aortic dilatation noted. There is borderline dilatation of the aortic root. Venous: The inferior vena cava is normal in size with greater than 50% respiratory variability, suggesting right atrial pressure of 3 mmHg. IAS/Shunts: No atrial level shunt detected by color flow Doppler.  LEFT VENTRICLE PLAX 2D LVIDd:         4.40 cm LVIDs:         2.90 cm LV PW:         1.30 cm LV IVS:        1.20 cm LVOT diam:     2.20 cm LV SV:         49 LV SV Index:   27 LVOT Area:     3.80 cm  RIGHT VENTRICLE RV S prime:     18.30 cm/s LEFT ATRIUM             Index       RIGHT ATRIUM           Index LA diam:        2.90 cm 1.62 cm/m  RA Area:     14.90 cm LA Vol (A2C):   57.1 ml 31.85 ml/m RA Volume:   36.90 ml  20.58 ml/m LA Vol (A4C):   68.0 ml 37.93 ml/m LA Biplane Vol: 65.0 ml 36.25 ml/m  AORTIC VALVE LVOT Vmax:   89.23 cm/s LVOT Vmean:  58.733 cm/s LVOT VTI:    0.128 m  AORTA Ao Root diam: 3.90 cm Ao Asc diam:  3.40 cm  SHUNTS Systemic VTI:  0.13 m Systemic Diam: 2.20 cm Rachelle Hora Croitoru MD Electronically signed by Thurmon Fair MD Signature Date/Time: 10/16/2019/11:50:40 AM    Final    CT HEAD CODE  STROKE WO CONTRAST  Result Date: 10/15/2019 CLINICAL DATA:  Code stroke.  Altered mental status.  Headache. EXAM: CT HEAD WITHOUT CONTRAST TECHNIQUE: Contiguous axial images were obtained from the base of the skull through the vertex without intravenous contrast. COMPARISON:  Head CT 08/26/2018 FINDINGS: Brain: There is acute parenchymal hemorrhage with the epicenter in the left thalamus. The hematoma measures 3.2 x 2.3 x 2.5 cm (volume = 9.6 cm^3). There is intraventricular penetration with blood filling the third ventricle and nearly filling the fourth ventricle.  Small amount in the frontal horns of the lateral ventricles. No surrounding edema at this time. Lateral ventricles are dilated compared to the study of April 2020. Elsewhere, there chronic small-vessel ischemic changes of white matter. No large vessel territory stroke. No sign of mass. No extra-axial collection. 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 Skull: Previous left pterional craniotomy. Sinuses/Orbits: Clear/normal Other: None ASPECTS ([place]berta Stroke Program Early CT Score) - Ganglionic level infarction (caudate, lentiform nuclei, internal capsule, insula, M1-M3 cortex): 7 - Supraganglionic infarction (M4-M6 cortex): 3 Total score (0-10 with 10 being normal): 10 IMPRESSION: 1. Acute intraparenchymal hemorrhage in the left thalamus, 9.6 cc. Intraventricular penetration. Early dilatation of the lateral ventricles. 2. ASPECTS is 10 3. These results were communicated to Dr. Otelia Limes at 7:38 pmon 6/17/2021by text page via the [facility] messaging system. Electronically Signed   By: Paulina Fusi M.D.   On: 10/15/2019 19:40   VAS US CAROTID  Result Date: 10/19/2019 Carotid Arterial Duplex Study Indications:       CVA. Risk Factors:      Hypertension, Diabetes. Limitations        Today's exam was limited due to patient positioning. Comparison Study:  no prior Performing Technologist: Blanch Media RVS  Examination Guidelines: A complete evaluation includes B-mode imaging, spectral Doppler, color Doppler, and power Doppler as needed of all accessible portions of each vessel. Bilateral testing is considered an integral part of a complete examination. Limited examinations for reoccurring indications may be performed as noted.  Right Carotid Findings: +----------+--------+--------+--------+------------------+--------+           PSV cm/sEDV cm/sStenosisPlaque DescriptionComments +----------+--------+--------+--------+------------------+--------+ CCA Distal51      14                                          +----------+--------+--------+--------+------------------+--------+ ICA Prox  52      12      1-39%   heterogenous               +----------+--------+--------+--------+------------------+--------+ ECA       28      7                                          +----------+--------+--------+--------+------------------+--------+ +----------+--------+-------+--------+-------------------+           PSV cm/sEDV cmsDescribeArm Pressure (mmHG) +----------+--------+-------+--------+-------------------+ [id number]                                         +----------+--------+-------+--------+-------------------+ +---------+--------+--------+--------------+ VertebralPSV cm/sEDV cm/sNot identified +---------+--------+--------+--------------+  Left Carotid Findings: +----------+--------+--------+--------+------------------+--------+           PSV cm/sEDV cm/sStenosisPlaque DescriptionComments +----------+--------+--------+--------+------------------+--------+ CCA Prox  83      12              heterogenous               +----------+--------+--------+--------+------------------+--------+ CCA Distal54      13              heterogenous               +----------+--------+--------+--------+------------------+--------+ ICA Prox  49      15      1-39%   heterogenous               +----------+--------+--------+--------+------------------+--------+ ICA Distal50      17                                         +----------+--------+--------+--------+------------------+--------+ ECA       60      8                                          +----------+--------+--------+--------+------------------+--------+ +----------+--------+--------+--------+-------------------+           PSV cm/sEDV cm/sDescribeArm Pressure (mmHG) +----------+--------+--------+--------+-------------------+ [id number]                                           +----------+--------+--------+--------+-------------------+ +---------+--------+--+--------+--+---------+ VertebralPSV cm/s44EDV cm/s11Antegrade +---------+--------+--+--------+--+---------+   Summary: Right Carotid: Velocities in the right ICA are consistent with a 1-39% stenosis. Left Carotid: Velocities in the left ICA are consistent with a 1-39% stenosis. Vertebrals: Left vertebral artery demonstrates antegrade flow. Right vertebral             artery was not visualized. *See table(s) above for measurements and observations.  Electronically signed by Delia Heady MD on 10/19/2019 at 1:33:17 PM.    Final     Lab Data:  CBC: Recent Labs  Lab 10/25/19 0257 10/26/19 0302 10/27/19 0307 10/28/19 0442 10/29/19 0316  WBC 15.5* 14.5* 15.9* 15.3* 13.0*  HGB 13.4 12.6 13.0 12.3 11.4*  HCT 43.8 41.3 41.9 39.0 36.5  MCV 87.3 87.5 86.9 84.1 84.3  PLT 314 275 275 250 217   Basic Metabolic Panel: Recent Labs  Lab 10/22/19 2121 10/24/19 0337 10/25/19 2055 10/26/19 0302 10/27/19 0307 10/28/19 0442 10/29/19 0316  NA 153*   < > 148* 150* 138 139 139  K 3.4*   < > 4.0 3.7 5.7* 2.9* 2.8*  CL 117*   < > 117* 118* 109 106 105  CO2 22   < > 20* 22 19* 24 25  GLUCOSE 166*   < > 331* 194* 167* 151* 185*  BUN 51*   < > 36* 28* 18 10 14   CREATININE 1.15*   < > 1.03* 0.96 0.69 0.76 0.86  CALCIUM 9.3   < > 8.5* 8.4* 8.2* 8.2* 8.3*  MG 2.9*  --   --   --   --  1.8 1.9   < > = values in this interval not displayed.   GFR: Estimated Creatinine Clearance: 75.6 mL/min (by C-G formula based on SCr of 0.86 mg/dL). Liver Function Tests: No results for input(s): AST, ALT, ALKPHOS, BILITOT, PROT, ALBUMIN in the last 168 hours. No results for input(s): LIPASE, AMYLASE  in the last 168 hours. No results for input(s): AMMONIA in the last 168 hours. Coagulation Profile: No results for input(s): INR, PROTIME in the last 168 hours. Cardiac Enzymes: No results for input(s): CKTOTAL, CKMB, CKMBINDEX,  TROPONINI in the last 168 hours. BNP (last 3 results) No results for input(s): PROBNP in the last 8760 hours. HbA1C: No results for input(s): HGBA1C in the last 72 hours. CBG: Recent Labs  Lab 10/28/19 1149 10/28/19 1701 10/28/19 2135 10/29/19 0617 10/29/19 1141  GLUCAP 188* 210* 177* 147* 227*   Lipid Profile: No results for input(s): CHOL, HDL, LDLCALC, TRIG, CHOLHDL, LDLDIRECT in the last 72 hours. Thyroid Function Tests: No results for input(s): TSH, T4TOTAL, FREET4, T3FREE, THYROIDAB in the last 72 hours. Anemia Panel: No results for input(s): VITAMINB12, FOLATE, FERRITIN, TIBC, IRON, RETICCTPCT in the last 72 hours. Urine analysis:    Component Value Date/Time   COLORURINE YELLOW 10/24/2019 1618   APPEARANCEUR CLEAR 10/24/2019 1618   LABSPEC 1.029 10/24/2019 1618   PHURINE 5.0 10/24/2019 1618   GLUCOSEU >=500 (A) 10/24/2019 1618   HGBUR NEGATIVE 10/24/2019 1618   BILIRUBINUR NEGATIVE 10/24/2019 1618   KETONESUR 5 (A) 10/24/2019 1618   PROTEINUR 30 (A) 10/24/2019 1618   UROBILINOGEN 0.2 09/04/2014 1850   NITRITE NEGATIVE 10/24/2019 1618   LEUKOCYTESUR TRACE (A) 10/24/2019 1618     Jazzy Parmer M.D. [facility] 10/29/2019, 12:27 PM   Call night coverage person covering after 7pm

## 2019-10-29 NOTE — TOC Progression Note (Signed)
Transition of Care [facility]) - Progression Note    Patient Details  Name: Latoya Cox MRN: [id number] Date of Birth: Apr 28, 1966  Transition of [facility]) CM/SW Contact  Coralyn Helling, [place] Phone Number: 10/29/2019, 1:53 PM  Clinical Narrative:   Patient referred to CIR. Insurance remains to be a barrier. Unsure of what insurance will cover. LCSW faxed patient out to SNF as a back up. Unclear if insurance will cover SNF. Spouse working to resolve insurance issues. Patient needs PASRR, ncmust website down and cannot complete.   Coralyn Helling, Norberta Keens CSW [phone]         Expected Discharge Plan and Services                                                 Social Determinants of Health (SDOH) Interventions    Readmission Risk Interventions Readmission Risk Prevention Plan 10/29/2019  Transportation Screening Complete  Home Care Screening Complete  Some recent data might be hidden

## 2019-10-29 NOTE — Progress Notes (Signed)
Physical Therapy Treatment Patient Details Name: Latoya Cox MRN: [id number] DOB: 1966/04/21 Today's Date: 10/29/2019    History of Present Illness 54 y.o. female with DM, HTN and arthritis, [name] and depressed level of consciousness with garbled speech at home, after complaining of a headache earlier in the day. STAT CT head was obtained, revealing an ICH originating from the left basal ganglila, extending into the left lateral, third and 4th ventricles, with a small amount of blood in the right lateral ventricle as well. Early stages of hydrocephalus were also noted on CT. Pt underwent L frontal IVC placement and intubation on 6/18. Pt extubated on 6/23.    PT Comments    Mentation slowly, but notably improving.  Pt making basic choices from options provided.  Still little initiation to command.  Emphasis on transitions, balance and progressing gait stability/stamina.    Follow Up Recommendations  CIR     Equipment Recommendations  Other (comment)    Recommendations for Other Services       Precautions / Restrictions Precautions Precautions: Fall    Mobility  Bed Mobility Overal bed mobility: Needs Assistance Bed Mobility: Supine to Sit     Supine to sit: Mod assist;+2 for physical assistance     General bed mobility comments: little initiation, with cues and assists she starts to move and needs help to come up via R elbow  Transfers Overall transfer level: Needs assistance Equipment used: Rolling walker (2 wheeled) Transfers: Sit to/from Stand Sit to Stand: Mod assist;+2 safety/equipment         General transfer comment: cues for hand placement, assist to come forward more than boost.  Ambulation/Gait Ambulation/Gait assistance: Min assist;Mod assist;+2 safety/equipment Gait Distance (Feet): 60 Feet Assistive device: Rolling walker (2 wheeled) Gait Pattern/deviations: Step-through pattern Gait  velocity: slow Gait velocity interpretation: <1.8 ft/sec, indicate of risk for recurrent falls General Gait Details: mildly unsteady, some scisorring and mild staggering as she fatigues getting back to the bed, but much improved.   Stairs             Wheelchair Mobility    Modified Rankin (Stroke Patients Only) Modified Rankin (Stroke Patients Only) Modified Rankin: Moderately severe disability     Balance Overall balance assessment: Needs assistance   Sitting balance-Leahy Scale: Fair     Standing balance support: Bilateral upper extremity supported;During functional activity Standing balance-Leahy Scale: Poor Standing balance comment: reliant on external support  Stood for peri care a significant amount of time                            Cognition Arousal/Alertness: Awake/alert Behavior During Therapy: Flat affect;WFL for tasks assessed/performed Overall Cognitive Status: Impaired/Different from baseline (NT formally, making basic choices, still slowed processing)                                        Exercises      General Comments        Pertinent Vitals/Pain Pain Assessment: Faces Faces Pain Scale: No hurt    Home Living                      Prior Function            PT Goals (current goals can now be  found in the care plan section) Acute Rehab PT Goals PT Goal Formulation: Patient unable to participate in goal setting Time For Goal Achievement: 10/31/19 Potential to Achieve Goals: Fair Progress towards PT goals: Progressing toward goals    Frequency    Min 4X/week      PT Plan Current plan remains appropriate    Co-evaluation              AM-PAC PT "6 Clicks" Mobility   Outcome Measure  Help needed turning from your back to your side while in a flat bed without using bedrails?: A Lot Help needed moving from lying on your back to sitting on the side of a flat bed without using bedrails?: A  Lot Help needed moving to and from a bed to a chair (including a wheelchair)?: A Lot Help needed standing up from a chair using your arms (e.g., wheelchair or bedside chair)?: A Lot Help needed to walk in hospital room?: A Lot Help needed climbing 3-5 steps with a railing? : A Lot 6 Click Score: 12    End of Session   Activity Tolerance: Patient tolerated treatment well Patient left: in chair;with call bell/phone within reach;with chair alarm set Nurse Communication: Mobility status PT Visit Diagnosis: Other abnormalities of gait and mobility (R26.89);Other symptoms and signs involving the nervous system (Z60.109)     Time: 3235-5732 PT Time Calculation (min) (ACUTE ONLY): 17 min  Charges:  $Gait Training: 8-22 mins                     10/29/2019  Jacinto Halim., PT Acute Rehabilitation Services [phone]  (pager) [phone]  (office)   Eliseo Gum Eesa Justiss 10/29/2019, 5:52 PM

## 2019-10-30 DIAGNOSIS — R4182 Altered mental status, unspecified: Secondary | ICD-10-CM | POA: Diagnosis not present

## 2019-10-30 DIAGNOSIS — J9601 Acute respiratory failure with hypoxia: Secondary | ICD-10-CM | POA: Diagnosis not present

## 2019-10-30 DIAGNOSIS — R400 Somnolence: Secondary | ICD-10-CM | POA: Diagnosis not present

## 2019-10-30 DIAGNOSIS — E876 Hypokalemia: Secondary | ICD-10-CM | POA: Diagnosis not present

## 2019-10-30 LAB — GLUCOSE, CAPILLARY
Glucose-Capillary: 149 mg/dL — ABNORMAL HIGH (ref 70–99)
Glucose-Capillary: 225 mg/dL — ABNORMAL HIGH (ref 70–99)
Glucose-Capillary: 147 mg/dL — ABNORMAL HIGH (ref 70–99)
Glucose-Capillary: 371 mg/dL — ABNORMAL HIGH (ref 70–99)

## 2019-10-30 LAB — MAGNESIUM: Magnesium: 1.9 mg/dL (ref 1.7–2.4)

## 2019-10-30 NOTE — Progress Notes (Addendum)
Occupational Therapy Treatment Patient Details Name: Latoya Cox MRN: [id number] DOB: 1965/06/16 Today's Date: 10/30/2019    History of present illness 54 y.o. female with DM, HTN and arthritis, [name] and depressed level of consciousness with garbled speech at home, after complaining of a headache earlier in the day. STAT CT head was obtained, revealing an ICH originating from the left basal ganglila, extending into the left lateral, third and 4th ventricles, with a small amount of blood in the right lateral ventricle as well. Early stages of hydrocephalus were also noted on CT. Pt underwent L frontal IVC placement and intubation on 6/18. Pt extubated on 6/23.   OT comments  Upon arrival pt lethargic in bed, but agreeable to skilled-OT services. Completed grooming and simulated bathing tasks in recliner with set up and min A with max verbal cues for sequencing and attention to task. Pt completed bed mobility with Min A, transfers with Min Guard-Min A +2 for safety and walker management needing max multimodal cues for sequencing and attending to task. Pt able to read card on planted gift from church with comprehension and attempt to recall events from stroke. Pt pleasant but lethargic during session. Pt was returned to bed with alarm on and call bell/phone within reach. Pt is progressing towards OT goals. Believe CIR still appropriate to increase independence with ADLs.    Follow Up Recommendations  CIR    Equipment Recommendations  Other (comment) (TBD at next venue of care)    Recommendations for Other Services Rehab consult    Precautions / Restrictions Precautions Precautions: Fall       Mobility Bed Mobility Overal bed mobility: Needs Assistance Bed Mobility: Supine to Sit;Sit to Supine     Supine to sit: Min assist Sit to supine: Min assist   General bed mobility comments: Min A to assist with LB    Transfers Overall transfer level: Needs assistance Equipment used: Rolling walker (2 wheeled) Transfers: Sit to/from [employer] Sit to Stand: Min assist;+2 safety/equipment;Min guard Stand pivot transfers: Min assist;+2 safety/equipment       General transfer comment: cues for hand placement on RW and sequencing    Balance Overall balance assessment: Needs assistance Sitting-balance support: No upper extremity supported;Feet supported Sitting balance-Leahy Scale: Fair     Standing balance support: Bilateral upper extremity supported;During functional activity Standing balance-Leahy Scale: Poor Standing balance comment: reliant on RW needing verbal cues for sequuencing and opening eyes                           ADL either performed or assessed with clinical judgement   ADL       Grooming: Wash/dry face;Set up;Sitting Grooming Details (indicate cue type and reason): sitting in chair with washed face with washcloth with set up Upper Body Bathing: Sitting;Cueing for sequencing;Minimal assistance Upper Body Bathing Details (indicate cue type and reason): Pt donned lotion with Min A and max verbal cues for sequencing and staying awake Lower Body Bathing: Minimal assistance;Cueing for sequencing;Sitting/lateral leans Lower Body Bathing Details (indicate cue type and reason): Pt donned lotion with min A to upper and lower legs needing max verbal cues for sequencing and attention to task         Toilet Transfer: Minimal assistance;+2 for safety/equipment;Stand-pivot;RW;Cueing for sequencing Toilet Transfer Details (indicate cue type and reason): simulated to recliner using RW. Min A +2 for safety and  RW management. Needed max verbal cues to open eyes and for sequencing         Functional mobility during ADLs: Minimal assistance;+2 for safety/equipment;Rolling walker General ADL Comments: Pt needing max verbal cues for sequencing and opening eyes to  not fall asleep     Vision   Vision Assessment?: Vision impaired- to be further tested in functional context Additional Comments: Brought pt's attention to gifted plant in room. Pt able to read gift card to determine who gave her the flowers          Cognition Arousal/Alertness: Lethargic Behavior During Therapy: Flat affect;WFL for tasks assessed/performed Overall Cognitive Status: Impaired/Different from baseline Area of Impairment: Attention;Memory;Following commands;Safety/judgement;Awareness;Problem solving                   Current Attention Level: Sustained Memory: Decreased recall of precautions;Decreased short-term memory Following Commands: Follows one step commands inconsistently;Follows one step commands with increased time Safety/Judgement: Decreased awareness of safety;Decreased awareness of deficits Awareness: Intellectual Problem Solving: Slow processing;Decreased initiation;Difficulty sequencing;Requires verbal cues;Requires tactile cues General Comments: Pt with slow processing and decreased initiation. Follows simple commands but fatigues quickly.               General Comments Pt fatigued but asking about how long her "seizure" was.    Pertinent Vitals/ Pain       Pain Assessment: Faces Faces Pain Scale: No hurt         Frequency  Min 2X/week        Progress Toward Goals  OT Goals(current goals can now be found in the care plan section)  Progress towards OT goals: Progressing toward goals  Acute Rehab OT Goals Patient Stated Goal: To improve mobility Time For Goal Achievement: 11/02/19 Potential to Achieve Goals: Good  Plan Discharge plan remains appropriate;Frequency remains appropriate       AM-PAC OT "6 Clicks" Daily Activity     Outcome Measure   Help from another person eating meals?: Total Help from another person taking care of personal grooming?: A Little Help from another person toileting, which includes using toliet,  bedpan, or urinal?: A Little Help from another person bathing (including washing, rinsing, drying)?: A Little Help from another person to put on and taking off regular upper body clothing?: A Lot Help from another person to put on and taking off regular lower body clothing?: A Lot 6 Click Score: 14    End of Session Equipment Utilized During Treatment: Rolling walker;Gait belt  OT Visit Diagnosis: Other abnormalities of gait and mobility (R26.89);Hemiplegia and hemiparesis;Other symptoms and signs involving the nervous system (R29.898) Hemiplegia - Right/Left: Right Hemiplegia - dominant/non-dominant: Dominant Hemiplegia - caused by: Nontraumatic intracerebral hemorrhage   Activity Tolerance Patient limited by fatigue   Patient Left in bed;with call bell/phone within reach;with bed alarm set   Nurse Communication Mobility status      Time: 1610-9604 OT Time Calculation (min): 28 min  Charges: OT General Charges $OT Visit: 1 Visit OT Treatments $Self Care/Home Management : 23-37 mins  Kynsie Falkner/OTS   Ellicia Alix 10/30/2019, 3:43 PM

## 2019-10-30 NOTE — Progress Notes (Signed)
[facility]                                                                              Patient Demographics  Latoya Cox, is a 54 y.o. female, DOB - [birth year], BJY:[id number]  Admit date - 10/15/2019   Admitting Physician Latoya Pina, MD  Outpatient Primary MD for the patient is Latoya Broker, MD  Outpatient specialists:   LOS - 15  days   Medical records reviewed and are as summarized below:    Chief Complaint  Patient presents with  . Code Stroke       Brief summary   Per admitting MD: Latoya Medleyis an 54 y.o.femalewith DM, HTN and arthritis,[name] and depressed level of consciousness with garbled speech at home, after complaining of a headacheearlier in the day. Husband told EMS that the patient had been complaining of a headache since the morning, but was with no deficits at that time. At 4:30 PM, she continued to complain about her headache and she also felt sleepy, so she took a nap. Her husband checked on her at 6:30 PM, noted the right sided weakness and depressed level of consciousness, and called EMS. On EMS arrival, they noted the same, as well as right facial droop. She vomited en route and continued to vomit intermittently on arrival to the ED. She was obtunded to somnolent and unable to answer questions intelligibly. STAT CT head was obtained, revealing an ICH originating from the left basal ganglila, extending into the left lateral, third and 4th ventricles, with a small amount of blood in the right lateral ventricle as well. Early stages of hydrocephalus were also noted on CT   Admitted to ICU by Neuro. Seen by NSU-EVD placed Remained in ICU on vent support Weaned and transferred out to Neuro floor with neuro Transferred care to [facility] day 9 on 6/27 2/2 concerns for Hypernatremia 154, AKI 42/1.1 and hyperkalemia - resolved at 3.3    Assessment & Plan     Principal problem ICH -L thalamic ICH with IVH s/p EVD, likely hypertensive -CT head showed Acute intraparenchymal hemorrhage in the left thalamus,9.6 cc. Intraventricular penetration. Early dilatation of the lateral ventricles.  -MRI head not performed due to previous aneurysm clips -CTA head 6/19 showed no significant residual or recurrent aneurysm.  -CT head 6/24 showed Interval improvement in leftbasal ganglia hematoma. Resolution ofIVH.No hydrocephalus. -Carotid Dopplers unremarkable -2D echo- EF 60 - 65%. No cardiac source of emboli identified. -LDL 186, hemoglobin A1c 7.7 -No antithrombotic prior to admission, now on no antithrombotics -awaiting insurance approval for inpatient rehab -Still has cognitive slowing, flat affect but follows commands.  Outpatient follow-up with neurology  Active problems  Obstructive hydrocephalus s/p EVD -CT head showed worsening obstructive hydrocephalus -Neurosurgery consulted, status post EVD, Dr. Venetia Cox -CT head 6/24 showed Interval improvement in leftbasal ganglia hematoma. Resolution ofIVH.No hydrocephalus. -EVD self removed 6/24, no acute issues  Acute respiratory failure with hypoxia -Patient was intubated, self extubated on 6/24 -tolerating well, O2 sats 100% on room air  Hypernatremia -Sodium 150, creatinine improved 0.9 -Sodium  improving, 139  Hypokalemia -Obtain BMET  History of aneurysm -Status post aneurysm clip, per husband many years ago -CTA head 6/19, no significant residual or recurrent aneurysm   Hypertensive emergency BP improving -Continue Norvasc, Coreg, hydralazine, clonidine 0.1 mg twice daily  -No bradycardia  Hyperlipidemia LDL 186, goal less than seventy Continue statin  Diabetes mellitus type II, uncontrolled Hemoglobin A1c 7.7, resume outpatient meds at discharge For now continue sliding scale insulin, Levemir  Dysphagia -Due to intubation and stroke -Continue dysphagia 2 diet with  nectar thick liquids  Leukocytosis -Unclear etiology, no fevers, UA negative on 6/26 -Chest x-ray 6/26 showed bibasilar atelectasis including more subsegmental atelectasis in right lung base -High risk of aspiration, continue precautions, upright in the chair -No fevers or signs of infection, follow CBC  Paroxysmal SVT -Resolved, continue Coreg   Code Status: Full CODE STATUS DVT Prophylaxis:   SCD's Family Communication: Discussed all imaging results, lab results, explained to the patient's husband on the phone on 6/30.  Called today, x2 busy tone, will try again  Disposition Plan:     Status is: Inpatient  Remains inpatient appropriate because:Inpatient level of care appropriate due to severity of illness   Dispo: The patient is from: Home              Anticipated d/c is to: CIR              Anticipated d/c date is: 1 day              Patient currently is medically stable to d/c.  Still has cognitive deficits, however now cleared from neurological standpoint to be discharged to CIR when bed available, pending insurance approval      Time Spent in minutes    Procedures:    Consultants:   Neurology Neurosurgery  Antimicrobials:   Anti-infectives (From admission, onward)   None         Medications  Scheduled Meds: .  stroke: mapping our early stages of recovery book   Does not apply Once  . amantadine  100 mg Oral BID  . amLODipine  10 mg Oral Daily  . atorvastatin  40 mg Oral Daily  . carvedilol  25 mg Oral BID WC  . chlorhexidine  15 mL Mouth Rinse BID  . Chlorhexidine Gluconate Cloth  6 each Topical Daily  . cloNIDine  0.1 mg Oral BID  . docusate  100 mg Oral BID  . enoxaparin (LOVENOX) injection  40 mg Subcutaneous Q24H  . hydrALAZINE  100 mg Oral Q8H  . insulin aspart  0-15 Units Subcutaneous TID WC  . insulin detemir  10 Units Subcutaneous BID  . mouth rinse  15 mL Mouth Rinse q12n4p  . pantoprazole  40 mg Oral QHS  . polyethylene glycol   17 g Oral Daily  . senna-docusate  1 tablet Oral BID   Continuous Infusions:  PRN Meds:.acetaminophen **OR** [DISCONTINUED] acetaminophen (TYLENOL) oral liquid 160 mg/5 mL **OR** acetaminophen, hydrALAZINE, labetalol      Subjective:   Latoya Cox was seen and examined today.  Sitting up in the chair, eating breakfast, cognitive slowing flat affect but otherwise following commands.  No acute changes overnight.  No new neurological deficits.  No fevers or chills.  No nausea vomiting diarrhea or abdominal pain  Objective:   Vitals:   10/30/19 0327 10/30/19 0500 10/30/19 0748 10/30/19 1059  BP: 123/60  (!) 134/93 104/72  Pulse: 81  87 74  Resp: 19  17  19  Temp: 97.7 F (36.5 C)  98.5 F (36.9 C) 98.6 F (37 C)  TempSrc: Oral  Oral Oral  SpO2: 98%  100% 99%  Weight:  80.8 kg    Height:        Intake/Output Summary (Last 24 hours) at 10/30/2019 1201 Last data filed at 10/30/2019 0600 Gross per 24 hour  Intake 240 ml  Output 1375 ml  Net -1135 ml     Wt Readings from Last 3 Encounters:  10/30/19 80.8 kg  10/05/19 78 kg  01/14/19 80.3 kg    Physical Exam  General: Alert and oriented x 2, self and place, [facility]  Cardiovascular: S1 S2 clear, RRR. No pedal edema b/l  Respiratory: CTAB, no wheezing, rales or rhonchi  Gastrointestinal: Soft, nontender, nondistended, NBS  Ext: no pedal edema bilaterally  Neuro: Bilateral lower extremity weakness  Musculoskeletal: No cyanosis, clubbing  Skin: No rashes  Psych: Flat affect, cognitive slow mentation, improving      Data Reviewed:  I have personally reviewed following labs and imaging studies  Micro Results No results found for this or any previous visit (from the past 240 hour(s)).  Radiology Reports CT ANGIO HEAD W OR WO CONTRAST  Result Date: 10/17/2019 CLINICAL DATA:  Intraparenchymal hemorrhage. EXAM: CT ANGIOGRAPHY HEAD TECHNIQUE: Multidetector CT imaging of the head was performed using the  standard protocol during bolus administration of intravenous contrast. Multiplanar CT image reconstructions and MIPs were obtained to evaluate the vascular anatomy. CONTRAST:  75mL OMNIPAQUE IOHEXOL 350 MG/ML SOLN COMPARISON:  CT head without contrast 10/16/2019 at 1:48 a.m. FINDINGS: CT HEAD Brain: Hemorrhage centered in the left thalamus is stable in size. Intraventricular blood has increased. Left frontal ventriculostomy catheter is in place. Midline shift is slightly more prominent. Blood is seen in the third and fourth ventricles. No new parenchymal hemorrhage is present. Vascular: Aneurysm clip is again noted. Minimal vascular calcifications are present. No hyperdense vessel is evident. Skull: Craniotomy is noted. Calvarium is otherwise within normal limits. Sinuses: The paranasal sinuses and mastoid air cells are clear. Orbits: The globes and orbits are within normal limits. CTA HEAD Anterior circulation: Atherosclerotic changes are noted within the cavernous internal carotid arteries. No significant stenosis is present. Aneurysm clip is in place. No significant residual recurrent aneurysm is present. The A1 and M1 segments are normal. The anterior communicating artery is patent. MCA bifurcations are intact. ACA and MCA branch vessels are unremarkable. Posterior circulation: The vertebral arteries are codominant. PICA origins are visualized and normal. The basilar artery is normal. Both posterior cerebral arteries originate from basilar tip. The PCA branch vessels are within normal limits. Venous sinuses: The dural sinuses are patent. The straight sinus and deep cerebral veins patent. Cortical veins are unremarkable. Anatomic variants: None IMPRESSION: 1. Stable size of left thalamic hemorrhage. 2. Increased intraventricular blood. With slight increase in midline shift 3. Left frontal ventriculostomy catheter is in place. 4. No significant residual or recurrent aneurysm. 5. No significant proximal stenosis,  aneurysm, or branch vessel occlusion within the Circle of Willis. Normal CTA of the head. No focal etiology for the hemorrhage. Electronically Signed   By: Marin Robertshristopher  Mattern M.D.   On: 10/17/2019 07:13   CT HEAD WO CONTRAST  Result Date: 10/22/2019 CLINICAL DATA:  Intracranial hemorrhage.  Encephalopathy EXAM: CT HEAD WITHOUT CONTRAST TECHNIQUE: Contiguous axial images were obtained from the base of the skull through the vertex without intravenous contrast. COMPARISON:  CT head 10/17/2019 FINDINGS: Brain: Left medial basal  ganglia and thalamic hemorrhage shows interval improvement. No new hemorrhage. No intraventricular hemorrhage is present on today's study. Left ventricular drainage catheter is been removed. No hydrocephalus. Mild midline shift to the right due to the hematoma. Small subdural hygromas along the tentorium bilaterally unchanged. Negative for acute infarct or mass. Vascular: Negative for hyperdense vessel. Aneurysm clip left ophthalmic artery region Skull: Left pterional craniotomy.  No acute skeletal abnormality. Sinuses/Orbits: Mild mucosal edema paranasal sinuses. Negative orbit. Other: None IMPRESSION: Interval improvement in left basal ganglia hematoma. Resolution of intraventricular hemorrhage. Left ventricular drain has been removed.  No hydrocephalus. Electronically Marlan Palau: Charles  Clark M.D.   On: 10/22/2019 21:06   CT HEAD WO CONTRAST  Result Date: 10/16/2019 CLINICAL DATA:  Follow-up examination for intracranial hemorrhage. EXAM: CT HEAD WITHOUT CONTRAST TECHNIQUE: Contiguous axial images were obtained from the base of the skull through the vertex without intravenous contrast. COMPARISON:  Prior CT from 10/15/2019. FINDINGS: Brain: Acute intraparenchymal hemorrhage emanating from the left thalamus again seen, not significantly changed in size and morphology as compared to previous exam. This measures 3.4 x 3.1 x 2.0 cm (estimated volume 11 cc). Mildly increased localized  edema with trace 5 mm localized left-to-right shift at the septum pellucidum. Associated intraventricular extension with blood seen throughout the ventricular system. Degree of intraventricular blood is similar. Associated obstructive hydrocephalus appears slightly worsened from previous. No other acute intracranial hemorrhage. No acute large vessel territory infarct. No extra-axial fluid collection or visible mass lesion. Vascular: No hyperdense vessel. Aneurysm clip position near the left ICA terminus again noted. Skull: No scalp soft tissue abnormality. Prior left frontal craniotomy. Sinuses/Orbits: Globes and orbital soft tissues within normal limits. Paranasal sinuses and mastoid air cells remain clear. Other: None. IMPRESSION: 1. No significant interval change in size and morphology of acute intraparenchymal hemorrhage emanating from the left thalamus, estimated volume 11 CC. Mildly increased localized edema with trace 5 mm localized left-to-right shift at the septum pellucidum. 2. Associated intraventricular extension with blood throughout the ventricular system, similar to previous. Associated obstructive hydrocephalus appears slightly worsened from previous. 3. No other new acute intracranial abnormality. Electronically Signed   By: Rise Mu M.D.   On: 10/16/2019 02:11   DG CHEST PORT 1 VIEW  Result Date: 10/24/2019 CLINICAL DATA:  Stroke EXAM: PORTABLE CHEST 1 VIEW COMPARISON:  Radiograph 10/20/2019 FINDINGS: Persistent bandlike opacities in right lung base favoring subsegmental atelectatic change. Additional hazy areas of basilar atelectasis bilaterally. No focal consolidation, pneumothorax, effusion or convincing features of edema. Interval removal of the transesophageal endotracheal tubes. Telemetry leads remain over the chest. High attenuation contrast material is noted in the colon at the level of the splenic flexure. Soft tissues and osseous structures are otherwise unremarkable.  IMPRESSION: 1. Interval removal of the transesophageal and endotracheal tubes. 2. Bibasilar atelectasis including more subsegmental atelectasis in the right lung base. Electronically Signed   By: Kreg Shropshire M.D.   On: 10/24/2019 23:07   DG CHEST PORT 1 VIEW  Result Date: 10/20/2019 CLINICAL DATA:  Intubated. EXAM: PORTABLE CHEST 1 VIEW COMPARISON:  Chest x-ray dated October 18, 2019. FINDINGS: Unchanged endotracheal and enteric tubes. Stable cardiomediastinal silhouette. Normal pulmonary vascularity. Low lung volumes with mild bibasilar atelectasis. No focal consolidation, pleural effusion, or pneumothorax. No acute osseous abnormality. IMPRESSION: 1. Stable support tubes.  No active disease. Electronically Signed   By: Obie Dredge M.D.   On: 10/20/2019 11:55   DG Chest Port 1 View  Result Date: 10/18/2019  CLINICAL DATA:  Respiratory failure.  Evaluate pneumo. EXAM: PORTABLE CHEST 1 VIEW COMPARISON:  October 16, 2019 FINDINGS: The ETT is in good position. The NG tube terminates below today's film. No pneumothorax. The lungs are clear. Stable cardiomegaly. The hila and mediastinum are unchanged. IMPRESSION: 1. Support apparatus as above. 2. No other acute abnormalities. Electronically Signed   By: Gerome Sam III M.D   On: 10/18/2019 12:05   DG CHEST PORT 1 VIEW  Result Date: 10/16/2019 CLINICAL DATA:  ET tube and OG tube placed EXAM: PORTABLE CHEST 1 VIEW COMPARISON:  June 29, 2016 FINDINGS: The heart size and mediastinal contours are within normal limits. Probable subsegmental atelectasis seen at the right lung base. ETT is 2.8 cm above the carina. NG tube is seen below the diaphragm within the stomach. The visualized skeletal structures are unremarkable. IMPRESSION: ET tube and NG tube in satisfactory position. Subsegmental atelectasis at the right lung base. Electronically Signed   By: Jonna Clark M.D.   On: 10/16/2019 06:55   DG Swallowing Func-Speech Pathology  Result Date:  10/23/2019 Objective Swallowing Evaluation: Type of Study: MBS-Modified Barium Swallow Study  Patient Details Name: Latoya Cox MRN: [id number] Date of Birth: 1966-02-10 Today's Date: 10/23/2019 Time: SLP Start Time (ACUTE ONLY): 1339 -SLP Stop Time (ACUTE ONLY): 1357 SLP Time Calculation (min) (ACUTE ONLY): 18 min Past Medical History: Past Medical History: Diagnosis Date . Arthritis  . Chest wall pain  . Diabetes mellitus without complication (HCC)  . Hypertension  Past Surgical History: Past Surgical History: Procedure Laterality Date . aneurism repair   . lapband   HPI: Pt with acute headache and slurred speech, brought by EMS to hospital. CT head was obtained, revealing an ICH originating from the left basal ganglila, extending into the left lateral, third and 4th ventricles, with a small amount of blood in the right lateral ventricle as well. Early stages of hydrocephalus were also noted on CT. Acute hypoxia from respiratory failure, requiring intubation on 10/16/19. Extubated 10/21/19.  Subjective: alert, cooperative, needs cues Assessment / Plan / Recommendation CHL IP CLINICAL IMPRESSIONS 10/23/2019 Clinical Impression Pt has a mild oropharyngeal dysphagia. Orally, she has premature spillage of thin liquids and mildly prolonged mastication and transit of solids. Her pharyngeal phase is relatively more functional, but when drinking consecutively via cup or straw, there is trace, silent aspiration of thin liquids that spilled prematurely into the pyriform sinuses. No aspiration is observed with any other consistency. Also considering her mentation, recommend starting with Dys 2 (chopped) diet and nectar thick liquids with good potential to progress given additional time post-extubation and for cognitive therapy.  SLP Visit Diagnosis Dysphagia, oropharyngeal phase (R13.12) Attention and concentration deficit following -- Frontal lobe and executive function deficit following -- Impact on safety and function Mild  aspiration risk   CHL IP TREATMENT RECOMMENDATION 10/23/2019 Treatment Recommendations Therapy as outlined in treatment plan below   Prognosis 10/23/2019 Prognosis for Safe Diet Advancement Good Barriers to Reach Goals Cognitive deficits Barriers/Prognosis Comment -- CHL IP DIET RECOMMENDATION 10/23/2019 SLP Diet Recommendations Dysphagia 2 (Fine chop) solids;Nectar thick liquid Liquid Administration via Cup;Straw Medication Administration Crushed with puree Compensations Minimize environmental distractions Postural Changes Seated upright at 90 degrees   CHL IP OTHER RECOMMENDATIONS 10/23/2019 Recommended Consults -- Oral Care Recommendations Oral care BID Other Recommendations Order thickener from pharmacy;Prohibited food (jello, ice cream, thin soups);Remove water pitcher   CHL IP FOLLOW UP RECOMMENDATIONS 10/23/2019 Follow up Recommendations Inpatient Rehab   CHL IP FREQUENCY AND  DURATION 10/23/2019 Speech Therapy Frequency (ACUTE ONLY) min 2x/week Treatment Duration 2 weeks      CHL IP ORAL PHASE 10/23/2019 Oral Phase Impaired Oral - Pudding Teaspoon -- Oral - Pudding Cup -- Oral - Honey Teaspoon -- Oral - Honey Cup -- Oral - Nectar Teaspoon -- Oral - Nectar Cup WFL Oral - Nectar Straw WFL Oral - Thin Teaspoon -- Oral - Thin Cup Premature spillage Oral - Thin Straw Premature spillage Oral - Puree WFL Oral - Mech Soft Impaired mastication;Delayed oral transit Oral - Regular -- Oral - Multi-Consistency -- Oral - Pill -- Oral Phase - Comment --  CHL IP PHARYNGEAL PHASE 10/23/2019 Pharyngeal Phase Impaired Pharyngeal- Pudding Teaspoon -- Pharyngeal -- Pharyngeal- Pudding Cup -- Pharyngeal -- Pharyngeal- Honey Teaspoon -- Pharyngeal -- Pharyngeal- Honey Cup -- Pharyngeal -- Pharyngeal- Nectar Teaspoon -- Pharyngeal -- Pharyngeal- Nectar Cup WFL Pharyngeal -- Pharyngeal- Nectar Straw WFL Pharyngeal -- Pharyngeal- Thin Teaspoon -- Pharyngeal -- Pharyngeal- Thin Cup Reduced airway/laryngeal closure;Penetration/Aspiration during  swallow Pharyngeal Material enters airway, passes BELOW cords without attempt by patient to eject out (silent aspiration) Pharyngeal- Thin Straw Reduced airway/laryngeal closure;Penetration/Aspiration during swallow Pharyngeal Material enters airway, passes BELOW cords without attempt by patient to eject out (silent aspiration) Pharyngeal- Puree WFL Pharyngeal -- Pharyngeal- Mechanical Soft WFL Pharyngeal -- Pharyngeal- Regular -- Pharyngeal -- Pharyngeal- Multi-consistency -- Pharyngeal -- Pharyngeal- Pill -- Pharyngeal -- Pharyngeal Comment --  CHL IP CERVICAL ESOPHAGEAL PHASE 10/23/2019 Cervical Esophageal Phase WFL Pudding Teaspoon -- Pudding Cup -- Honey Teaspoon -- Honey Cup -- Nectar Teaspoon -- Nectar Cup -- Nectar Straw -- Thin Teaspoon -- Thin Cup -- Thin Straw -- Puree -- Mechanical Soft -- Regular -- Multi-consistency -- Pill -- Cervical Esophageal Comment -- Mahala Menghini., M.A. CCC-SLP Acute Rehabilitation Services Pager [phone] Office [phone] 10/23/2019, 3:28 PM              ECHOCARDIOGRAM COMPLETE  Result Date: 10/16/2019    ECHOCARDIOGRAM REPORT   Patient Name:   Latoya Cox Date of Exam: 10/16/2019 Medical Rec #:  [id number]     Height:       62.0 in Accession #:    [id number]    Weight:       172.0 lb Date of Birth:  [birth year]      BSA:          1.793 m Patient Age:    54 years      BP:           128/62 mmHg Patient Gender: F             HR:           99 bpm. Exam Location:  Inpatient Procedure: 2D Echo, Color Doppler, Cardiac Doppler and Intracardiac            Opacification Agent Indications:    Stroke i163.9  History:        Patient has no prior history of Echocardiogram examinations.                 Risk Factors:Hypertension and Diabetes.  Sonographer:    Irving Burton Senior RDCS Referring Phys: [id number] Marvel Plan  Sonographer Comments: Echo performed with patient supine and on artificial respirator. IMPRESSIONS  1. Left ventricular ejection fraction, by estimation, is 60 to 65%. The left  ventricle has normal function. The left ventricle has no regional wall motion abnormalities. There is mild concentric left ventricular hypertrophy. Left ventricular diastolic parameters are consistent with Grade I diastolic dysfunction (  impaired relaxation).  2. Right ventricular systolic function is normal. The right ventricular size is normal.  3. The mitral valve is normal in structure. No evidence of mitral valve regurgitation. No evidence of mitral stenosis.  4. The aortic valve is normal in structure. Aortic valve regurgitation is not visualized. No aortic stenosis is present.  5. Aortic dilatation noted. There is borderline dilatation of the aortic root.  6. The inferior vena cava is normal in size with greater than 50% respiratory variability, suggesting right atrial pressure of 3 mmHg. FINDINGS  Left Ventricle: Left ventricular ejection fraction, by estimation, is 60 to 65%. The left ventricle has normal function. The left ventricle has no regional wall motion abnormalities. Definity contrast agent was given IV to delineate the left ventricular  endocardial borders. The left ventricular internal cavity size was normal in size. There is mild concentric left ventricular hypertrophy. Left ventricular diastolic parameters are consistent with Grade I diastolic dysfunction (impaired relaxation). Right Ventricle: The right ventricular size is normal. No increase in right ventricular wall thickness. Right ventricular systolic function is normal. Left Atrium: Left atrial size was normal in size. Right Atrium: Right atrial size was normal in size. Pericardium: There is no evidence of pericardial effusion. Mitral Valve: The mitral valve is normal in structure. Normal mobility of the mitral valve leaflets. No evidence of mitral valve regurgitation. No evidence of mitral valve stenosis. Tricuspid Valve: The tricuspid valve is normal in structure. Tricuspid valve regurgitation is not demonstrated. No evidence of  tricuspid stenosis. Aortic Valve: The aortic valve is normal in structure. Aortic valve regurgitation is not visualized. No aortic stenosis is present. Pulmonic Valve: The pulmonic valve was normal in structure. Pulmonic valve regurgitation is not visualized. No evidence of pulmonic stenosis. Aorta: Aortic dilatation noted. There is borderline dilatation of the aortic root. Venous: The inferior vena cava is normal in size with greater than 50% respiratory variability, suggesting right atrial pressure of 3 mmHg. IAS/Shunts: No atrial level shunt detected by color flow Doppler.  LEFT VENTRICLE PLAX 2D LVIDd:         4.40 cm LVIDs:         2.90 cm LV PW:         1.30 cm LV IVS:        1.20 cm LVOT diam:     2.20 cm LV SV:         49 LV SV Index:   27 LVOT Area:     3.80 cm  RIGHT VENTRICLE RV S prime:     18.30 cm/s LEFT ATRIUM             Index       RIGHT ATRIUM           Index LA diam:        2.90 cm 1.62 cm/m  RA Area:     14.90 cm LA Vol (A2C):   57.1 ml 31.85 ml/m RA Volume:   36.90 ml  20.58 ml/m LA Vol (A4C):   68.0 ml 37.93 ml/m LA Biplane Vol: 65.0 ml 36.25 ml/m  AORTIC VALVE LVOT Vmax:   89.23 cm/s LVOT Vmean:  58.733 cm/s LVOT VTI:    0.128 m  AORTA Ao Root diam: 3.90 cm Ao Asc diam:  3.40 cm  SHUNTS Systemic VTI:  0.13 m Systemic Diam: 2.20 cm Rachelle Hora Croitoru MD Electronically signed by Thurmon Fair MD Signature Date/Time: 10/16/2019/11:50:40 AM    Final    CT HEAD CODE STROKE WO  CONTRAST  Result Date: 10/15/2019 CLINICAL DATA:  Code stroke.  Altered mental status.  Headache. EXAM: CT HEAD WITHOUT CONTRAST TECHNIQUE: Contiguous axial images were obtained from the base of the skull through the vertex without intravenous contrast. COMPARISON:  Head CT 08/26/2018 FINDINGS: Brain: There is acute parenchymal hemorrhage with the epicenter in the left thalamus. The hematoma measures 3.2 x 2.3 x 2.5 cm (volume = 9.6 cm^3). There is intraventricular penetration with blood filling the third ventricle and  nearly filling the fourth ventricle. Small amount in the frontal horns of the lateral ventricles. No surrounding edema at this time. Lateral ventricles are dilated compared to the study of April 2020. Elsewhere, there chronic small-vessel ischemic changes of white matter. No large vessel territory stroke. No sign of mass. No extra-axial collection. 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 Skull: Previous left pterional craniotomy. Sinuses/Orbits: Clear/normal Other: None ASPECTS ([place]berta Stroke Program Early CT Score) - Ganglionic level infarction (caudate, lentiform nuclei, internal capsule, insula, M1-M3 cortex): 7 - Supraganglionic infarction (M4-M6 cortex): 3 Total score (0-10 with 10 being normal): 10 IMPRESSION: 1. Acute intraparenchymal hemorrhage in the left thalamus, 9.6 cc. Intraventricular penetration. Early dilatation of the lateral ventricles. 2. ASPECTS is 10 3. These results were communicated to Dr. Otelia Limes at 7:38 pmon 6/17/2021by text page via the [facility] messaging system. Electronically Signed   By: Paulina Fusi M.D.   On: 10/15/2019 19:40   VAS US CAROTID  Result Date: 10/19/2019 Carotid Arterial Duplex Study Indications:       CVA. Risk Factors:      Hypertension, Diabetes. Limitations        Today's exam was limited due to patient positioning. Comparison Study:  no prior Performing Technologist: Blanch Media RVS  Examination Guidelines: A complete evaluation includes B-mode imaging, spectral Doppler, color Doppler, and power Doppler as needed of all accessible portions of each vessel. Bilateral testing is considered an integral part of a complete examination. Limited examinations for reoccurring indications may be performed as noted.  Right Carotid Findings: +----------+--------+--------+--------+------------------+--------+           PSV cm/sEDV cm/sStenosisPlaque DescriptionComments +----------+--------+--------+--------+------------------+--------+  CCA Distal51      14                                         +----------+--------+--------+--------+------------------+--------+ ICA Prox  52      12      1-39%   heterogenous               +----------+--------+--------+--------+------------------+--------+ ECA       28      7                                          +----------+--------+--------+--------+------------------+--------+ +----------+--------+-------+--------+-------------------+           PSV cm/sEDV cmsDescribeArm Pressure (mmHG) +----------+--------+-------+--------+-------------------+ [id number]                                         +----------+--------+-------+--------+-------------------+ +---------+--------+--------+--------------+ VertebralPSV cm/sEDV cm/sNot identified +---------+--------+--------+--------------+  Left Carotid Findings: +----------+--------+--------+--------+------------------+--------+           PSV cm/sEDV cm/sStenosisPlaque DescriptionComments +----------+--------+--------+--------+------------------+--------+ CCA Prox  83  12              heterogenous               +----------+--------+--------+--------+------------------+--------+ CCA Distal54      13              heterogenous               +----------+--------+--------+--------+------------------+--------+ ICA Prox  49      15      1-39%   heterogenous               +----------+--------+--------+--------+------------------+--------+ ICA Distal50      17                                         +----------+--------+--------+--------+------------------+--------+ ECA       60      8                                          +----------+--------+--------+--------+------------------+--------+ +----------+--------+--------+--------+-------------------+           PSV cm/sEDV cm/sDescribeArm Pressure (mmHG) +----------+--------+--------+--------+-------------------+ [id number]                                           +----------+--------+--------+--------+-------------------+ +---------+--------+--+--------+--+---------+ VertebralPSV cm/s44EDV cm/s11Antegrade +---------+--------+--+--------+--+---------+   Summary: Right Carotid: Velocities in the right ICA are consistent with a 1-39% stenosis. Left Carotid: Velocities in the left ICA are consistent with a 1-39% stenosis. Vertebrals: Left vertebral artery demonstrates antegrade flow. Right vertebral             artery was not visualized. *See table(s) above for measurements and observations.  Electronically signed by Delia Heady MD on 10/19/2019 at 1:33:17 PM.    Final     Lab Data:  CBC: Recent Labs  Lab 10/25/19 0257 10/26/19 0302 10/27/19 0307 10/28/19 0442 10/29/19 0316  WBC 15.5* 14.5* 15.9* 15.3* 13.0*  HGB 13.4 12.6 13.0 12.3 11.4*  HCT 43.8 41.3 41.9 39.0 36.5  MCV 87.3 87.5 86.9 84.1 84.3  PLT 314 275 275 250 217   Basic Metabolic Panel: Recent Labs  Lab 10/25/19 2055 10/26/19 0302 10/27/19 0307 10/28/19 0442 10/29/19 0316 10/30/19 0350  NA 148* 150* 138 139 139  --   K 4.0 3.7 5.7* 2.9* 2.8*  --   CL 117* 118* 109 106 105  --   CO2 20* 22 19* 24 25  --   GLUCOSE 331* 194* 167* 151* 185*  --   BUN 36* 28* 18 10 14   --   CREATININE 1.03* 0.96 0.69 0.76 0.86  --   CALCIUM 8.5* 8.4* 8.2* 8.2* 8.3*  --   MG  --   --   --  1.8 1.9 1.9   GFR: Estimated Creatinine Clearance: 75.4 mL/min (by C-G formula based on SCr of 0.86 mg/dL). Liver Function Tests: No results for input(s): AST, ALT, ALKPHOS, BILITOT, PROT, ALBUMIN in the last 168 hours. No results for input(s): LIPASE, AMYLASE in the last 168 hours. No results for input(s): AMMONIA in the last 168 hours. Coagulation Profile: No results for input(s): INR, PROTIME in the last 168 hours. Cardiac Enzymes: No results for input(s): CKTOTAL, CKMB, CKMBINDEX, TROPONINI  in the last 168 hours. BNP (last 3 results) No results for input(s):  PROBNP in the last 8760 hours. HbA1C: No results for input(s): HGBA1C in the last 72 hours. CBG: Recent Labs  Lab 10/29/19 1141 10/29/19 1517 10/29/19 2146 10/30/19 0646 10/30/19 1104  GLUCAP 227* 215* 161* 149* 225*   Lipid Profile: No results for input(s): CHOL, HDL, LDLCALC, TRIG, CHOLHDL, LDLDIRECT in the last 72 hours. Thyroid Function Tests: No results for input(s): TSH, T4TOTAL, FREET4, T3FREE, THYROIDAB in the last 72 hours. Anemia Panel: No results for input(s): VITAMINB12, FOLATE, FERRITIN, TIBC, IRON, RETICCTPCT in the last 72 hours. Urine analysis:    Component Value Date/Time   COLORURINE YELLOW 10/24/2019 1618   APPEARANCEUR CLEAR 10/24/2019 1618   LABSPEC 1.029 10/24/2019 1618   PHURINE 5.0 10/24/2019 1618   GLUCOSEU >=500 (A) 10/24/2019 1618   HGBUR NEGATIVE 10/24/2019 1618   BILIRUBINUR NEGATIVE 10/24/2019 1618   KETONESUR 5 (A) 10/24/2019 1618   PROTEINUR 30 (A) 10/24/2019 1618   UROBILINOGEN 0.2 09/04/2014 1850   NITRITE NEGATIVE 10/24/2019 1618   LEUKOCYTESUR TRACE (A) 10/24/2019 1618     Eshan Trupiano M.D. [facility] 10/30/2019, 12:01 PM   Call night coverage person covering after 7pm

## 2019-10-31 DIAGNOSIS — R4182 Altered mental status, unspecified: Secondary | ICD-10-CM | POA: Diagnosis not present

## 2019-10-31 DIAGNOSIS — E860 Dehydration: Secondary | ICD-10-CM | POA: Diagnosis not present

## 2019-10-31 DIAGNOSIS — R400 Somnolence: Secondary | ICD-10-CM | POA: Diagnosis not present

## 2019-10-31 DIAGNOSIS — J9601 Acute respiratory failure with hypoxia: Secondary | ICD-10-CM | POA: Diagnosis not present

## 2019-10-31 LAB — GLUCOSE, CAPILLARY
Glucose-Capillary: 168 mg/dL — ABNORMAL HIGH (ref 70–99)
Glucose-Capillary: 241 mg/dL — ABNORMAL HIGH (ref 70–99)
Glucose-Capillary: 176 mg/dL — ABNORMAL HIGH (ref 70–99)
Glucose-Capillary: 93 mg/dL (ref 70–99)

## 2019-10-31 LAB — BASIC METABOLIC PANEL
Anion gap: 9 (ref 5–15)
BUN: 14 mg/dL (ref 6–20)
CO2: 26 mmol/L (ref 22–32)
Calcium: 8.5 mg/dL — ABNORMAL LOW (ref 8.9–10.3)
Chloride: 103 mmol/L (ref 98–111)
Creatinine, Ser: 0.88 mg/dL (ref 0.44–1.00)
GFR calc Af Amer: >60 mL/min (ref >60)
GFR calc non Af Amer: >60 mL/min (ref >60)
Glucose, Bld: 214 mg/dL — ABNORMAL HIGH (ref 70–99)
Potassium: 3.2 mmol/L — ABNORMAL LOW (ref 3.5–5.1)
Sodium: 138 mmol/L (ref 135–145)

## 2019-10-31 LAB — CBC
HCT: 38.1 % (ref 36.0–46.0)
Hemoglobin: 12 g/dL (ref 12.0–15.0)
MCH: 27 pg (ref 26.0–34.0)
MCHC: 31.5 g/dL (ref 30.0–36.0)
MCV: 85.6 fL (ref 80.0–100.0)
Platelets: 275 10*3/uL (ref 150–400)
RBC: 4.45 MIL/uL (ref 3.87–5.11)
RDW: 13.7 % (ref 11.5–15.5)
WBC: 11.6 10*3/uL — ABNORMAL HIGH (ref 4.0–10.5)
nRBC: 0 % (ref 0.0–0.2)

## 2019-10-31 LAB — MAGNESIUM: Magnesium: 2 mg/dL (ref 1.7–2.4)

## 2019-10-31 MED ORDER — INSULIN DETEMIR 100 UNIT/ML ~~LOC~~ SOLN
13.0000 [IU] | Freq: Two times a day (BID) | SUBCUTANEOUS | Status: DC
  Administered 2019-10-31 – 2019-11-23 (×46): 13 [IU] via SUBCUTANEOUS
  Filled 2019-10-31 (×48): qty 0.13

## 2019-10-31 MED ORDER — INSULIN ASPART 100 UNIT/ML ~~LOC~~ SOLN
4.0000 [IU] | Freq: Three times a day (TID) | SUBCUTANEOUS | Status: DC
  Administered 2019-10-31 – 2019-11-23 (×62): 4 [IU] via SUBCUTANEOUS

## 2019-10-31 MED ORDER — INSULIN ASPART 100 UNIT/ML ~~LOC~~ SOLN
0.0000 [IU] | Freq: Three times a day (TID) | SUBCUTANEOUS | Status: DC
  Administered 2019-10-31: 2 [IU] via SUBCUTANEOUS
  Administered 2019-11-01: 3 [IU] via SUBCUTANEOUS
  Administered 2019-11-01: 1 [IU] via SUBCUTANEOUS
  Administered 2019-11-02: 2 [IU] via SUBCUTANEOUS
  Administered 2019-11-02 (×2): 5 [IU] via SUBCUTANEOUS
  Administered 2019-11-03: 3 [IU] via SUBCUTANEOUS
  Administered 2019-11-04 – 2019-11-05 (×4): 1 [IU] via SUBCUTANEOUS
  Administered 2019-11-05: 2 [IU] via SUBCUTANEOUS
  Administered 2019-11-06: 5 [IU] via SUBCUTANEOUS
  Administered 2019-11-06: 1 [IU] via SUBCUTANEOUS
  Administered 2019-11-06: 2 [IU] via SUBCUTANEOUS
  Administered 2019-11-07: 1 [IU] via SUBCUTANEOUS
  Administered 2019-11-09 – 2019-11-10 (×3): 2 [IU] via SUBCUTANEOUS
  Administered 2019-11-10: 1 [IU] via SUBCUTANEOUS
  Administered 2019-11-11 – 2019-11-13 (×4): 2 [IU] via SUBCUTANEOUS
  Administered 2019-11-13: 1 [IU] via SUBCUTANEOUS
  Administered 2019-11-14: 3 [IU] via SUBCUTANEOUS
  Administered 2019-11-14 – 2019-11-15 (×2): 2 [IU] via SUBCUTANEOUS
  Administered 2019-11-15 – 2019-11-16 (×2): 3 [IU] via SUBCUTANEOUS
  Administered 2019-11-16 – 2019-11-17 (×2): 2 [IU] via SUBCUTANEOUS
  Administered 2019-11-17 – 2019-11-18 (×2): 3 [IU] via SUBCUTANEOUS
  Administered 2019-11-18: 1 [IU] via SUBCUTANEOUS
  Administered 2019-11-18: 2 [IU] via SUBCUTANEOUS
  Administered 2019-11-19: 3 [IU] via SUBCUTANEOUS
  Administered 2019-11-19: 9 [IU] via SUBCUTANEOUS
  Administered 2019-11-19 – 2019-11-20 (×2): 1 [IU] via SUBCUTANEOUS
  Administered 2019-11-20 – 2019-11-21 (×2): 2 [IU] via SUBCUTANEOUS
  Administered 2019-11-21: 5 [IU] via SUBCUTANEOUS
  Administered 2019-11-22: 3 [IU] via SUBCUTANEOUS
  Administered 2019-11-22: 2 [IU] via SUBCUTANEOUS
  Administered 2019-11-23: 7 [IU] via SUBCUTANEOUS

## 2019-10-31 MED ORDER — POTASSIUM CHLORIDE CRYS ER 20 MEQ PO TBCR
40.0000 meq | EXTENDED_RELEASE_TABLET | Freq: Once | ORAL | Status: AC
  Administered 2019-10-31: 40 meq via ORAL
  Filled 2019-10-31: qty 2

## 2019-10-31 NOTE — Progress Notes (Signed)
[facility]                                                                              Patient Demographics  Latoya Cox, is a 54 y.o. female, DOB - 04-09-1966, ZOX:[id number]  Admit date - 10/15/2019   Admitting Physician Caryl Pina, MD  Outpatient Primary MD for the patient is Myrlene Broker, MD  Outpatient specialists:   LOS - 16  days   Medical records reviewed and are as summarized below:    Chief Complaint  Patient presents with  . Code Stroke       Brief summary   Per admitting MD: WJX:BJYNWG Medleyis an 54 y.o.femalewith DM, HTN and arthritis,[name] and depressed level of consciousness with garbled speech at home, after complaining of a headacheearlier in the day. Husband told EMS that the patient had been complaining of a headache since the morning, but was with no deficits at that time. At 4:30 PM, she continued to complain about her headache and she also felt sleepy, so she took a nap. Her husband checked on her at 6:30 PM, noted the right sided weakness and depressed level of consciousness, and called EMS. On EMS arrival, they noted the same, as well as right facial droop. She vomited en route and continued to vomit intermittently on arrival to the ED. She was obtunded to somnolent and unable to answer questions intelligibly. STAT CT head was obtained, revealing an ICH originating from the left basal ganglila, extending into the left lateral, third and 4th ventricles, with a small amount of blood in the right lateral ventricle as well. Early stages of hydrocephalus were also noted on CT   Admitted to ICU by Neuro. Seen by NSU-EVD placed Remained in ICU on vent support Weaned and transferred out to Neuro floor with neuro Transferred care to [facility] day 9 on 6/27 2/2 concerns for Hypernatremia 154, AKI 42/1.1 and hyperkalemia - resolved at 3.3    Assessment & Plan     Principal problem ICH -L thalamic ICH with IVH s/p EVD, likely hypertensive -CT head showed Acute intraparenchymal hemorrhage in the left thalamus,9.6 cc. Intraventricular penetration. Early dilatation of the lateral ventricles.  -MRI head not performed due to previous aneurysm clips -CTA head 6/19 showed no significant residual or recurrent aneurysm.  -CT head 6/24 showed Interval improvement in leftbasal ganglia hematoma. Resolution ofIVH.No hydrocephalus. -Carotid Dopplers unremarkable -2D echo- EF 60 - 65%. No cardiac source of emboli identified. -LDL 186, hemoglobin A1c 7.7 -No antithrombotic prior to admission, now on no antithrombotics, outpatient follow-up with neurology -Still has some cognitive slowing, improving, flat affect, awaiting skilled nursing facility  Active problems  Obstructive hydrocephalus s/p EVD -CT head showed worsening obstructive hydrocephalus -Neurosurgery consulted, status post EVD, Dr. Venetia Maxon -CT head 6/24 showed Interval improvement in leftbasal ganglia hematoma. Resolution ofIVH.No hydrocephalus. -EVD self removed 6/24, no acute issues  Acute respiratory failure with hypoxia -Patient was intubated, self extubated on 6/24 -No issues, O2 sats 100% on room air  Hypernatremia -Sodium 150, creatinine improved 0.9 -Improved, sodium 138  Hypokalemia -  Potassium 3.2, replaced  History of aneurysm -Status post aneurysm clip, per husband many years ago -CTA head 6/19, no significant residual or recurrent aneurysm   Hypertensive emergency -BP stable, continue Norvasc, Coreg, hydralazine, clonidine -No bradycardia  Hyperlipidemia LDL 186, goal less than seventy Continue statin  Diabetes mellitus type II, uncontrolled Hemoglobin A1c 7.7, resume outpatient meds at discharge -CBGs uncontrolled, 214 fasting -Increased Levemir to 13 units twice daily, NovoLog 4 units 3 times daily AC, sliding scale insulin sensitive  Dysphagia -Due to  intubation and stroke -Continue dysphagia 2 diet with nectar thick liquids  Leukocytosis -Unclear etiology, no fevers, UA negative on 6/26 -Chest x-ray 6/26 showed bibasilar atelectasis including more subsegmental atelectasis in right lung base -High risk of aspiration, continue precautions, upright in the chair -No fevers or signs of infection, follow CBC  Paroxysmal SVT -Resolved, continue Coreg   Code Status: Full CODE STATUS DVT Prophylaxis:   SCD's Family Communication: Discussed all imaging results, lab results, explained to the patient.   Disposition Plan:     Status is: Inpatient  Remains inpatient appropriate because:Inpatient level of care appropriate due to severity of illness   Dispo: The patient is from: Home              Anticipated d/c is to: SNF              Anticipated d/c date is: 1 day              Patient currently is medically stable to d/c.  Pending insurance approval, patient to be discharged to skilled nursing facility    Time Spent in minutes    Procedures:    Consultants:   Neurology Neurosurgery  Antimicrobials:   Anti-infectives (From admission, onward)   None         Medications  Scheduled Meds: .  stroke: mapping our early stages of recovery book   Does not apply Once  . amantadine  100 mg Oral BID  . amLODipine  10 mg Oral Daily  . atorvastatin  40 mg Oral Daily  . carvedilol  25 mg Oral BID WC  . chlorhexidine  15 mL Mouth Rinse BID  . Chlorhexidine Gluconate Cloth  6 each Topical Daily  . cloNIDine  0.1 mg Oral BID  . docusate  100 mg Oral BID  . enoxaparin (LOVENOX) injection  40 mg Subcutaneous Q24H  . hydrALAZINE  100 mg Oral Q8H  . insulin aspart  0-15 Units Subcutaneous TID WC  . insulin detemir  10 Units Subcutaneous BID  . mouth rinse  15 mL Mouth Rinse q12n4p  . pantoprazole  40 mg Oral QHS  . polyethylene glycol  17 g Oral Daily  . senna-docusate  1 tablet Oral BID   Continuous Infusions:  PRN  Meds:.acetaminophen **OR** [DISCONTINUED] acetaminophen (TYLENOL) oral liquid 160 mg/5 mL **OR** acetaminophen, hydrALAZINE, labetalol      Subjective:   Latoya Cox was seen and examined today.  No acute issues, sitting up in the chair.  Flat affect but follows commands, no acute neurological deficits.  No nausea vomiting diarrhea or abdominal pain.  No new events overnight  Objective:   Vitals:   10/31/19 0004 10/31/19 0340 10/31/19 0719 10/31/19 1109  BP: 112/78 127/86 134/84 91/67  Pulse: 84 82 87 74  Resp: 18 18 18 18   Temp: 98.3 F (36.8 C) 98.9 F (37.2 C) 99.3 F (37.4 C) 98 F (36.7 C)  TempSrc: Oral Oral Oral   SpO2:  100% 100% 100% 100%  Weight:      Height:        Intake/Output Summary (Last 24 hours) at 10/31/2019 1326 Last data filed at 10/30/2019 1700 Gross per 24 hour  Intake 240 ml  Output --  Net 240 ml     Wt Readings from Last 3 Encounters:  10/30/19 80.8 kg  10/05/19 78 kg  01/14/19 80.3 kg   Physical Exam  General: Alert and oriented x self and place, NAD, comfortable  Cardiovascular: S1 S2 clear, RRR. No pedal edema b/l  Respiratory: CTAB, no wheezing, rales or rhonchi  Gastrointestinal: Soft, nontender, nondistended, NBS  Ext: no pedal edema bilaterally  Neuro: no new deficits  Musculoskeletal: No cyanosis, clubbing  Skin: No rashes  Psych: Flat affect     Data Reviewed:  I have personally reviewed following labs and imaging studies  Micro Results No results found for this or any previous visit (from the past 240 hour(s)).  Radiology Reports CT ANGIO HEAD W OR WO CONTRAST  Result Date: 10/17/2019 CLINICAL DATA:  Intraparenchymal hemorrhage. EXAM: CT ANGIOGRAPHY HEAD TECHNIQUE: Multidetector CT imaging of the head was performed using the standard protocol during bolus administration of intravenous contrast. Multiplanar CT image reconstructions and MIPs were obtained to evaluate the vascular anatomy. CONTRAST:  20mL OMNIPAQUE  IOHEXOL 350 MG/ML SOLN COMPARISON:  CT head without contrast 10/16/2019 at 1:48 a.m. FINDINGS: CT HEAD Brain: Hemorrhage centered in the left thalamus is stable in size. Intraventricular blood has increased. Left frontal ventriculostomy catheter is in place. Midline shift is slightly more prominent. Blood is seen in the third and fourth ventricles. No new parenchymal hemorrhage is present. Vascular: Aneurysm clip is again noted. Minimal vascular calcifications are present. No hyperdense vessel is evident. Skull: Craniotomy is noted. Calvarium is otherwise within normal limits. Sinuses: The paranasal sinuses and mastoid air cells are clear. Orbits: The globes and orbits are within normal limits. CTA HEAD Anterior circulation: Atherosclerotic changes are noted within the cavernous internal carotid arteries. No significant stenosis is present. Aneurysm clip is in place. No significant residual recurrent aneurysm is present. The A1 and M1 segments are normal. The anterior communicating artery is patent. MCA bifurcations are intact. ACA and MCA branch vessels are unremarkable. Posterior circulation: The vertebral arteries are codominant. PICA origins are visualized and normal. The basilar artery is normal. Both posterior cerebral arteries originate from basilar tip. The PCA branch vessels are within normal limits. Venous sinuses: The dural sinuses are patent. The straight sinus and deep cerebral veins patent. Cortical veins are unremarkable. Anatomic variants: None IMPRESSION: 1. Stable size of left thalamic hemorrhage. 2. Increased intraventricular blood. With slight increase in midline shift 3. Left frontal ventriculostomy catheter is in place. 4. No significant residual or recurrent aneurysm. 5. No significant proximal stenosis, aneurysm, or branch vessel occlusion within the Circle of Willis. Normal CTA of the head. No focal etiology for the hemorrhage. Electronically Signed   By: Marin Roberts M.D.   On:  10/17/2019 07:13   CT HEAD WO CONTRAST  Result Date: 10/22/2019 CLINICAL DATA:  Intracranial hemorrhage.  Encephalopathy EXAM: CT HEAD WITHOUT CONTRAST TECHNIQUE: Contiguous axial images were obtained from the base of the skull through the vertex without intravenous contrast. COMPARISON:  CT head 10/17/2019 FINDINGS: Brain: Left medial basal ganglia and thalamic hemorrhage shows interval improvement. No new hemorrhage. No intraventricular hemorrhage is present on today's study. Left ventricular drainage catheter is been removed. No hydrocephalus. Mild midline shift to the right due  to the hematoma. Small subdural hygromas along the tentorium bilaterally unchanged. Negative for acute infarct or mass. Vascular: Negative for hyperdense vessel. Aneurysm clip left ophthalmic artery region Skull: Left pterional craniotomy.  No acute skeletal abnormality. Sinuses/Orbits: Mild mucosal edema paranasal sinuses. Negative orbit. Other: None IMPRESSION: Interval improvement in left basal ganglia hematoma. Resolution of intraventricular hemorrhage. Left ventricular drain has been removed.  No hydrocephalus. Electronically Signed   By: Marlan Palau M.D.   On: 10/22/2019 21:06   CT HEAD WO CONTRAST  Result Date: 10/16/2019 CLINICAL DATA:  Follow-up examination for intracranial hemorrhage. EXAM: CT HEAD WITHOUT CONTRAST TECHNIQUE: Contiguous axial images were obtained from the base of the skull through the vertex without intravenous contrast. COMPARISON:  Prior CT from 10/15/2019. FINDINGS: Brain: Acute intraparenchymal hemorrhage emanating from the left thalamus again seen, not significantly changed in size and morphology as compared to previous exam. This measures 3.4 x 3.1 x 2.0 cm (estimated volume 11 cc). Mildly increased localized edema with trace 5 mm localized left-to-right shift at the septum pellucidum. Associated intraventricular extension with blood seen throughout the ventricular system. Degree of  intraventricular blood is similar. Associated obstructive hydrocephalus appears slightly worsened from previous. No other acute intracranial hemorrhage. No acute large vessel territory infarct. No extra-axial fluid collection or visible mass lesion. Vascular: No hyperdense vessel. Aneurysm clip position near the left ICA terminus again noted. Skull: No scalp soft tissue abnormality. Prior left frontal craniotomy. Sinuses/Orbits: Globes and orbital soft tissues within normal limits. Paranasal sinuses and mastoid air cells remain clear. Other: None. IMPRESSION: 1. No significant interval change in size and morphology of acute intraparenchymal hemorrhage emanating from the left thalamus, estimated volume 11 CC. Mildly increased localized edema with trace 5 mm localized left-to-right shift at the septum pellucidum. 2. Associated intraventricular extension with blood throughout the ventricular system, similar to previous. Associated obstructive hydrocephalus appears slightly worsened from previous. 3. No other new acute intracranial abnormality. Electronically Signed   By: Rise Mu M.D.   On: 10/16/2019 02:11   DG CHEST PORT 1 VIEW  Result Date: 10/24/2019 CLINICAL DATA:  Stroke EXAM: PORTABLE CHEST 1 VIEW COMPARISON:  Radiograph 10/20/2019 FINDINGS: Persistent bandlike opacities in right lung base favoring subsegmental atelectatic change. Additional hazy areas of basilar atelectasis bilaterally. No focal consolidation, pneumothorax, effusion or convincing features of edema. Interval removal of the transesophageal endotracheal tubes. Telemetry leads remain over the chest. High attenuation contrast material is noted in the colon at the level of the splenic flexure. Soft tissues and osseous structures are otherwise unremarkable. IMPRESSION: 1. Interval removal of the transesophageal and endotracheal tubes. 2. Bibasilar atelectasis including more subsegmental atelectasis in the right lung base.  Electronically Signed   By: Kreg Shropshire M.D.   On: 10/24/2019 23:07   DG CHEST PORT 1 VIEW  Result Date: 10/20/2019 CLINICAL DATA:  Intubated. EXAM: PORTABLE CHEST 1 VIEW COMPARISON:  Chest x-ray dated October 18, 2019. FINDINGS: Unchanged endotracheal and enteric tubes. Stable cardiomediastinal silhouette. Normal pulmonary vascularity. Low lung volumes with mild bibasilar atelectasis. No focal consolidation, pleural effusion, or pneumothorax. No acute osseous abnormality. IMPRESSION: 1. Stable support tubes.  No active disease. Electronically Signed   By: Obie Dredge M.D.   On: 10/20/2019 11:55   DG Chest Port 1 View  Result Date: 10/18/2019 CLINICAL DATA:  Respiratory failure.  Evaluate pneumo. EXAM: PORTABLE CHEST 1 VIEW COMPARISON:  October 16, 2019 FINDINGS: The ETT is in good position. The NG tube terminates below today's film. No pneumothorax.  The lungs are clear. Stable cardiomegaly. The hila and mediastinum are unchanged. IMPRESSION: 1. Support apparatus as above. 2. No other acute abnormalities. Electronically Signed   By: Gerome Sam III M.D   On: 10/18/2019 12:05   DG CHEST PORT 1 VIEW  Result Date: 10/16/2019 CLINICAL DATA:  ET tube and OG tube placed EXAM: PORTABLE CHEST 1 VIEW COMPARISON:  June 29, 2016 FINDINGS: The heart size and mediastinal contours are within normal limits. Probable subsegmental atelectasis seen at the right lung base. ETT is 2.8 cm above the carina. NG tube is seen below the diaphragm within the stomach. The visualized skeletal structures are unremarkable. IMPRESSION: ET tube and NG tube in satisfactory position. Subsegmental atelectasis at the right lung base. Electronically Signed   By: Jonna Clark M.D.   On: 10/16/2019 06:55   DG Swallowing Func-Speech Pathology  Result Date: 10/23/2019 Objective Swallowing Evaluation: Type of Study: MBS-Modified Barium Swallow Study  Patient Details Name: Latoya Cox MRN: [id number] Date of Birth: 1966/02/05 Today's Date:  10/23/2019 Time: SLP Start Time (ACUTE ONLY): 1339 -SLP Stop Time (ACUTE ONLY): 1357 SLP Time Calculation (min) (ACUTE ONLY): 18 min Past Medical History: Past Medical History: Diagnosis Date . Arthritis  . Chest wall pain  . Diabetes mellitus without complication (HCC)  . Hypertension  Past Surgical History: Past Surgical History: Procedure Laterality Date . aneurism repair   . lapband   HPI: Pt with acute headache and slurred speech, brought by EMS to hospital. CT head was obtained, revealing an ICH originating from the left basal ganglila, extending into the left lateral, third and 4th ventricles, with a small amount of blood in the right lateral ventricle as well. Early stages of hydrocephalus were also noted on CT. Acute hypoxia from respiratory failure, requiring intubation on 10/16/19. Extubated 10/21/19.  Subjective: alert, cooperative, needs cues Assessment / Plan / Recommendation CHL IP CLINICAL IMPRESSIONS 10/23/2019 Clinical Impression Pt has a mild oropharyngeal dysphagia. Orally, she has premature spillage of thin liquids and mildly prolonged mastication and transit of solids. Her pharyngeal phase is relatively more functional, but when drinking consecutively via cup or straw, there is trace, silent aspiration of thin liquids that spilled prematurely into the pyriform sinuses. No aspiration is observed with any other consistency. Also considering her mentation, recommend starting with Dys 2 (chopped) diet and nectar thick liquids with good potential to progress given additional time post-extubation and for cognitive therapy.  SLP Visit Diagnosis Dysphagia, oropharyngeal phase (R13.12) Attention and concentration deficit following -- Frontal lobe and executive function deficit following -- Impact on safety and function Mild aspiration risk   CHL IP TREATMENT RECOMMENDATION 10/23/2019 Treatment Recommendations Therapy as outlined in treatment plan below   Prognosis 10/23/2019 Prognosis for Safe Diet  Advancement Good Barriers to Reach Goals Cognitive deficits Barriers/Prognosis Comment -- CHL IP DIET RECOMMENDATION 10/23/2019 SLP Diet Recommendations Dysphagia 2 (Fine chop) solids;Nectar thick liquid Liquid Administration via Cup;Straw Medication Administration Crushed with puree Compensations Minimize environmental distractions Postural Changes Seated upright at 90 degrees   CHL IP OTHER RECOMMENDATIONS 10/23/2019 Recommended Consults -- Oral Care Recommendations Oral care BID Other Recommendations Order thickener from pharmacy;Prohibited food (jello, ice cream, thin soups);Remove water pitcher   CHL IP FOLLOW UP RECOMMENDATIONS 10/23/2019 Follow up Recommendations Inpatient Rehab   CHL IP FREQUENCY AND DURATION 10/23/2019 Speech Therapy Frequency (ACUTE ONLY) min 2x/week Treatment Duration 2 weeks      CHL IP ORAL PHASE 10/23/2019 Oral Phase Impaired Oral - Pudding Teaspoon -- Oral - Pudding  Cup -- Oral - Honey Teaspoon -- Oral - Honey Cup -- Oral - Nectar Teaspoon -- Oral - Nectar Cup WFL Oral - Nectar Straw WFL Oral - Thin Teaspoon -- Oral - Thin Cup Premature spillage Oral - Thin Straw Premature spillage Oral - Puree WFL Oral - Mech Soft Impaired mastication;Delayed oral transit Oral - Regular -- Oral - Multi-Consistency -- Oral - Pill -- Oral Phase - Comment --  CHL IP PHARYNGEAL PHASE 10/23/2019 Pharyngeal Phase Impaired Pharyngeal- Pudding Teaspoon -- Pharyngeal -- Pharyngeal- Pudding Cup -- Pharyngeal -- Pharyngeal- Honey Teaspoon -- Pharyngeal -- Pharyngeal- Honey Cup -- Pharyngeal -- Pharyngeal- Nectar Teaspoon -- Pharyngeal -- Pharyngeal- Nectar Cup WFL Pharyngeal -- Pharyngeal- Nectar Straw WFL Pharyngeal -- Pharyngeal- Thin Teaspoon -- Pharyngeal -- Pharyngeal- Thin Cup Reduced airway/laryngeal closure;Penetration/Aspiration during swallow Pharyngeal Material enters airway, passes BELOW cords without attempt by patient to eject out (silent aspiration) Pharyngeal- Thin Straw Reduced airway/laryngeal  closure;Penetration/Aspiration during swallow Pharyngeal Material enters airway, passes BELOW cords without attempt by patient to eject out (silent aspiration) Pharyngeal- Puree WFL Pharyngeal -- Pharyngeal- Mechanical Soft WFL Pharyngeal -- Pharyngeal- Regular -- Pharyngeal -- Pharyngeal- Multi-consistency -- Pharyngeal -- Pharyngeal- Pill -- Pharyngeal -- Pharyngeal Comment --  CHL IP CERVICAL ESOPHAGEAL PHASE 10/23/2019 Cervical Esophageal Phase WFL Pudding Teaspoon -- Pudding Cup -- Honey Teaspoon -- Honey Cup -- Nectar Teaspoon -- Nectar Cup -- Nectar Straw -- Thin Teaspoon -- Thin Cup -- Thin Straw -- Puree -- Mechanical Soft -- Regular -- Multi-consistency -- Pill -- Cervical Esophageal Comment -- Mahala Menghini., M.A. CCC-SLP Acute Rehabilitation Services Pager [phone] Office [phone] 10/23/2019, 3:28 PM              ECHOCARDIOGRAM COMPLETE  Result Date: 10/16/2019    ECHOCARDIOGRAM REPORT   Patient Name:   Latoya Cox Date of Exam: 10/16/2019 Medical Rec #:  [id number]     Height:       62.0 in Accession #:    [id number]    Weight:       172.0 lb Date of Birth:  03/07/1966      BSA:          1.793 m Patient Age:    54 years      BP:           128/62 mmHg Patient Gender: F             HR:           99 bpm. Exam Location:  Inpatient Procedure: 2D Echo, Color Doppler, Cardiac Doppler and Intracardiac            Opacification Agent Indications:    Stroke i163.9  History:        Patient has no prior history of Echocardiogram examinations.                 Risk Factors:Hypertension and Diabetes.  Sonographer:    Irving Burton Senior RDCS Referring Phys: [id number] Marvel Plan  Sonographer Comments: Echo performed with patient supine and on artificial respirator. IMPRESSIONS  1. Left ventricular ejection fraction, by estimation, is 60 to 65%. The left ventricle has normal function. The left ventricle has no regional wall motion abnormalities. There is mild concentric left ventricular hypertrophy. Left ventricular  diastolic parameters are consistent with Grade I diastolic dysfunction (impaired relaxation).  2. Right ventricular systolic function is normal. The right ventricular size is normal.  3. The mitral valve is normal in structure. No evidence of mitral valve regurgitation. No evidence of  mitral stenosis.  4. The aortic valve is normal in structure. Aortic valve regurgitation is not visualized. No aortic stenosis is present.  5. Aortic dilatation noted. There is borderline dilatation of the aortic root.  6. The inferior vena cava is normal in size with greater than 50% respiratory variability, suggesting right atrial pressure of 3 mmHg. FINDINGS  Left Ventricle: Left ventricular ejection fraction, by estimation, is 60 to 65%. The left ventricle has normal function. The left ventricle has no regional wall motion abnormalities. Definity contrast agent was given IV to delineate the left ventricular  endocardial borders. The left ventricular internal cavity size was normal in size. There is mild concentric left ventricular hypertrophy. Left ventricular diastolic parameters are consistent with Grade I diastolic dysfunction (impaired relaxation). Right Ventricle: The right ventricular size is normal. No increase in right ventricular wall thickness. Right ventricular systolic function is normal. Left Atrium: Left atrial size was normal in size. Right Atrium: Right atrial size was normal in size. Pericardium: There is no evidence of pericardial effusion. Mitral Valve: The mitral valve is normal in structure. Normal mobility of the mitral valve leaflets. No evidence of mitral valve regurgitation. No evidence of mitral valve stenosis. Tricuspid Valve: The tricuspid valve is normal in structure. Tricuspid valve regurgitation is not demonstrated. No evidence of tricuspid stenosis. Aortic Valve: The aortic valve is normal in structure. Aortic valve regurgitation is not visualized. No aortic stenosis is present. Pulmonic Valve: The  pulmonic valve was normal in structure. Pulmonic valve regurgitation is not visualized. No evidence of pulmonic stenosis. Aorta: Aortic dilatation noted. There is borderline dilatation of the aortic root. Venous: The inferior vena cava is normal in size with greater than 50% respiratory variability, suggesting right atrial pressure of 3 mmHg. IAS/Shunts: No atrial level shunt detected by color flow Doppler.  LEFT VENTRICLE PLAX 2D LVIDd:         4.40 cm LVIDs:         2.90 cm LV PW:         1.30 cm LV IVS:        1.20 cm LVOT diam:     2.20 cm LV SV:         49 LV SV Index:   27 LVOT Area:     3.80 cm  RIGHT VENTRICLE RV S prime:     18.30 cm/s LEFT ATRIUM             Index       RIGHT ATRIUM           Index LA diam:        2.90 cm 1.62 cm/m  RA Area:     14.90 cm LA Vol (A2C):   57.1 ml 31.85 ml/m RA Volume:   36.90 ml  20.58 ml/m LA Vol (A4C):   68.0 ml 37.93 ml/m LA Biplane Vol: 65.0 ml 36.25 ml/m  AORTIC VALVE LVOT Vmax:   89.23 cm/s LVOT Vmean:  58.733 cm/s LVOT VTI:    0.128 m  AORTA Ao Root diam: 3.90 cm Ao Asc diam:  3.40 cm  SHUNTS Systemic VTI:  0.13 m Systemic Diam: 2.20 cm Rachelle Hora Croitoru MD Electronically signed by Thurmon Fair MD Signature Date/Time: 10/16/2019/11:50:40 AM    Final    CT HEAD CODE STROKE WO CONTRAST  Result Date: 10/15/2019 CLINICAL DATA:  Code stroke.  Altered mental status.  Headache. EXAM: CT HEAD WITHOUT CONTRAST TECHNIQUE: Contiguous axial images were obtained from the base of the skull through  the vertex without intravenous contrast. COMPARISON:  Head CT 08/26/2018 FINDINGS: Brain: There is acute parenchymal hemorrhage with the epicenter in the left thalamus. The hematoma measures 3.2 x 2.3 x 2.5 cm (volume = 9.6 cm^3). There is intraventricular penetration with blood filling the third ventricle and nearly filling the fourth ventricle. Small amount in the frontal horns of the lateral ventricles. No surrounding edema at this time. Lateral ventricles are dilated compared  to the study of April 2020. Elsewhere, there chronic small-vessel ischemic changes of white matter. No large vessel territory stroke. No sign of mass. No extra-axial collection. 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 Skull: Previous left pterional craniotomy. Sinuses/Orbits: Clear/normal Other: None ASPECTS (Alberta Stroke Program Early CT Score) - Ganglionic level infarction (caudate, lentiform nuclei, internal capsule, insula, M1-M3 cortex): 7 - Supraganglionic infarction (M4-M6 cortex): 3 Total score (0-10 with 10 being normal): 10 IMPRESSION: 1. Acute intraparenchymal hemorrhage in the left thalamus, 9.6 cc. Intraventricular penetration. Early dilatation of the lateral ventricles. 2. ASPECTS is 10 3. These results were communicated to Dr. Otelia LimesLindzen at 7:38 pmon 6/17/2021by text page via the [facility]. Electronically Signed   By: Paulina FusiMark  Shogry M.D.   On: 10/15/2019 19:40   VAS US CAROTID  Result Date: 10/19/2019 Carotid Arterial Duplex Study Indications:       CVA. Risk Factors:      Hypertension, Diabetes. Limitations        Today's exam was limited due to patient positioning. Comparison Study:  no prior Performing Technologist: Blanch MediaMegan Riddle RVS  Examination Guidelines: A complete evaluation includes B-mode imaging, spectral Doppler, color Doppler, and power Doppler as needed of all accessible portions of each vessel. Bilateral testing is considered an integral part of a complete examination. Limited examinations for reoccurring indications may be performed as noted.  Right Carotid Findings: +----------+--------+--------+--------+------------------+--------+           PSV cm/sEDV cm/sStenosisPlaque DescriptionComments +----------+--------+--------+--------+------------------+--------+ CCA Distal51      14                                         +----------+--------+--------+--------+------------------+--------+ ICA Prox  52      12      1-39%    heterogenous               +----------+--------+--------+--------+------------------+--------+ ECA       28      7                                          +----------+--------+--------+--------+------------------+--------+ +----------+--------+-------+--------+-------------------+           PSV cm/sEDV cmsDescribeArm Pressure (mmHG) +----------+--------+-------+--------+-------------------+ [id number]                                         +----------+--------+-------+--------+-------------------+ +---------+--------+--------+--------------+ VertebralPSV cm/sEDV cm/sNot identified +---------+--------+--------+--------------+  Left Carotid Findings: +----------+--------+--------+--------+------------------+--------+           PSV cm/sEDV cm/sStenosisPlaque DescriptionComments +----------+--------+--------+--------+------------------+--------+ CCA Prox  83      12              heterogenous               +----------+--------+--------+--------+------------------+--------+  CCA Distal54      13              heterogenous               +----------+--------+--------+--------+------------------+--------+ ICA Prox  49      15      1-39%   heterogenous               +----------+--------+--------+--------+------------------+--------+ ICA Distal50      17                                         +----------+--------+--------+--------+------------------+--------+ ECA       60      8                                          +----------+--------+--------+--------+------------------+--------+ +----------+--------+--------+--------+-------------------+           PSV cm/sEDV cm/sDescribeArm Pressure (mmHG) +----------+--------+--------+--------+-------------------+ [id number]                                          +----------+--------+--------+--------+-------------------+ +---------+--------+--+--------+--+---------+ VertebralPSV cm/s44EDV  cm/s11Antegrade +---------+--------+--+--------+--+---------+   Summary: Right Carotid: Velocities in the right ICA are consistent with a 1-39% stenosis. Left Carotid: Velocities in the left ICA are consistent with a 1-39% stenosis. Vertebrals: Left vertebral artery demonstrates antegrade flow. Right vertebral             artery was not visualized. *See table(s) above for measurements and observations.  Electronically signed by Delia Heady MD on 10/19/2019 at 1:33:17 PM.    Final     Lab Data:  CBC: Recent Labs  Lab 10/26/19 0302 10/27/19 0307 10/28/19 0442 10/29/19 0316 10/31/19 0438  WBC 14.5* 15.9* 15.3* 13.0* 11.6*  HGB 12.6 13.0 12.3 11.4* 12.0  HCT 41.3 41.9 39.0 36.5 38.1  MCV 87.5 86.9 84.1 84.3 85.6  PLT 275 275 250 217 275   Basic Metabolic Panel: Recent Labs  Lab 10/26/19 0302 10/27/19 0307 10/28/19 0442 10/29/19 0316 10/30/19 0350 10/31/19 0438  NA 150* 138 139 139  --  138  K 3.7 5.7* 2.9* 2.8*  --  3.2*  CL 118* 109 106 105  --  103  CO2 22 19* 24 25  --  26  GLUCOSE 194* 167* 151* 185*  --  214*  BUN 28* 18 10 14   --  14  CREATININE 0.96 0.69 0.76 0.86  --  0.88  CALCIUM 8.4* 8.2* 8.2* 8.3*  --  8.5*  MG  --   --  1.8 1.9 1.9 2.0   GFR: Estimated Creatinine Clearance: 73.7 mL/min (by C-G formula based on SCr of 0.88 mg/dL). Liver Function Tests: No results for input(s): AST, ALT, ALKPHOS, BILITOT, PROT, ALBUMIN in the last 168 hours. No results for input(s): LIPASE, AMYLASE in the last 168 hours. No results for input(s): AMMONIA in the last 168 hours. Coagulation Profile: No results for input(s): INR, PROTIME in the last 168 hours. Cardiac Enzymes: No results for input(s): CKTOTAL, CKMB, CKMBINDEX, TROPONINI in the last 168 hours. BNP (last 3 results) No results for input(s): PROBNP in the last 8760 hours. HbA1C: No results for input(s): HGBA1C in the last 72 hours. CBG: Recent  Labs  Lab 10/30/19 1104 10/30/19 1642 10/30/19 2113  10/31/19 0651 10/31/19 1106  GLUCAP 225* 147* 371* 168* 241*   Lipid Profile: No results for input(s): CHOL, HDL, LDLCALC, TRIG, CHOLHDL, LDLDIRECT in the last 72 hours. Thyroid Function Tests: No results for input(s): TSH, T4TOTAL, FREET4, T3FREE, THYROIDAB in the last 72 hours. Anemia Panel: No results for input(s): VITAMINB12, FOLATE, FERRITIN, TIBC, IRON, RETICCTPCT in the last 72 hours. Urine analysis:    Component Value Date/Time   COLORURINE YELLOW 10/24/2019 1618   APPEARANCEUR CLEAR 10/24/2019 1618   LABSPEC 1.029 10/24/2019 1618   PHURINE 5.0 10/24/2019 1618   GLUCOSEU >=500 (A) 10/24/2019 1618   HGBUR NEGATIVE 10/24/2019 1618   BILIRUBINUR NEGATIVE 10/24/2019 1618   KETONESUR 5 (A) 10/24/2019 1618   PROTEINUR 30 (A) 10/24/2019 1618   UROBILINOGEN 0.2 09/04/2014 1850   NITRITE NEGATIVE 10/24/2019 1618   LEUKOCYTESUR TRACE (A) 10/24/2019 1618     Shamicka Inga M.D. [facility] 10/31/2019, 1:26 PM   Call night coverage person covering after 7pm

## 2019-11-01 ENCOUNTER — Other Ambulatory Visit: Payer: Self-pay | Admitting: Internal Medicine

## 2019-11-01 DIAGNOSIS — J9601 Acute respiratory failure with hypoxia: Secondary | ICD-10-CM | POA: Diagnosis not present

## 2019-11-01 DIAGNOSIS — E876 Hypokalemia: Secondary | ICD-10-CM | POA: Diagnosis not present

## 2019-11-01 DIAGNOSIS — E860 Dehydration: Secondary | ICD-10-CM | POA: Diagnosis not present

## 2019-11-01 DIAGNOSIS — R400 Somnolence: Secondary | ICD-10-CM | POA: Diagnosis not present

## 2019-11-01 LAB — GLUCOSE, CAPILLARY
Glucose-Capillary: 108 mg/dL — ABNORMAL HIGH (ref 70–99)
Glucose-Capillary: 206 mg/dL — ABNORMAL HIGH (ref 70–99)
Glucose-Capillary: 138 mg/dL — ABNORMAL HIGH (ref 70–99)
Glucose-Capillary: 268 mg/dL — ABNORMAL HIGH (ref 70–99)

## 2019-11-01 NOTE — Progress Notes (Addendum)
PROGRESS NOTE    Latoya Cox  KGM:[id number]  DOB: 18-Aug-1965  PCP: Myrlene Broker, MD Admit date:10/15/2019 Chief compliant: Headache, AMS 54 y.o.femalewith DM, HTN and arthritis,[name] and depressed level of consciousness with garbled speech at home, after complaining of a headacheearlier in the day. On EMS arrival, they noted right facial droop. She vomited en route and continued to vomit intermittently on arrival to the ED.  ED Course: She was obtunded to somnolent and unable to answer questions intelligibly. STAT CT head was obtained, revealing an ICH originating from the left basal ganglila, extending into the left lateral, third and 4th ventricles, with a small amount of blood in the right lateral ventricle as well. Early stages of hydrocephalus were also noted on CT Hospital course: Patient admitted to ICU by Neuro.Seen by NSU-EVD placed. Remained in ICU on vent support , subsequently weaned and transferred out to [facility] on Hosp day 9 ( 6/27) with concerns for Hypernatremia 154, AKI 42/1.1 and hyperkalemia - resolved and now hypokalemia at 3.2  Subjective:  Patient appears somnolent.  Easily arousable but unable to stay awake to answer questions when seen in rounds this morning.  She denies any headaches.  SBP been soft this afternoon with systolic 90s to low 100s.  Antihypertensives held.  Objective: Vitals:   11/01/19 2339 11/02/19 0444 11/02/19 0500 11/02/19 0734  BP: 101/69 122/88  106/72  Pulse: 85 83  86  Resp: 18 18  18   Temp: 98.7 F (37.1 C) 98.5 F (36.9 C)  98.7 F (37.1 C)  TempSrc: Oral Oral  Oral  SpO2: 98% 98%  98%  Weight:   78.4 kg   Height:        Intake/Output Summary (Last 24 hours) at 11/02/2019 0916 Last data filed at 11/02/2019 0755 Gross per 24 hour  Intake 860 ml  Output 400 ml  Net 460 ml   Filed Weights   10/30/19 0500 11/01/19 0500 11/02/19 0500  Weight: 80.8 kg 78.4  kg 78.4 kg    Physical Examination:  General: Appears somnolent, no acute distress otherwise. Head ENT: Atraumatic normocephalic, PERRLA, neck supple Heart: S1-S2 heard, regular rate and rhythm, no murmurs.  No leg edema noted Lungs: Equal air entry bilaterally, no rhonchi or rales on exam, no accessory muscle use Abdomen: Bowel sounds heard, soft, nontender, nondistended. No organomegaly.  No CVA tenderness Extremities: No pedal edema.  No cyanosis or clubbing. Neurological: Somnolent, able to move all extremities on command although appears weak in bilateral lower extremities compared to upper. Skin: No wounds or rashes.   Data Reviewed: I have personally reviewed following labs and imaging studies  CBC: Recent Labs  Lab 10/27/19 0307 10/28/19 0442 10/29/19 0316 10/31/19 0438 11/02/19 0631  WBC 15.9* 15.3* 13.0* 11.6* 10.7*  HGB 13.0 12.3 11.4* 12.0 11.4*  HCT 41.9 39.0 36.5 38.1 36.4  MCV 86.9 84.1 84.3 85.6 85.0  PLT 275 250 217 275 266   Basic Metabolic Panel: Recent Labs  Lab 10/27/19 0307 10/28/19 0442 10/29/19 0316 10/30/19 0350 10/31/19 0438 11/02/19 0631  NA 138 139 139  --  138 140  K 5.7* 2.9* 2.8*  --  3.2* 3.2*  CL 109 106 105  --  103 104  CO2 19* 24 25  --  26 26  GLUCOSE 167* 151* 185*  --  214* 167*  BUN 18 10 14   --  14 15  CREATININE 0.69 0.76  0.86  --  0.88 0.91  CALCIUM 8.2* 8.2* 8.3*  --  8.5* 8.5*  MG  --  1.8 1.9 1.9 2.0  --    GFR: Estimated Creatinine Clearance: 70.1 mL/min (by C-G formula based on SCr of 0.91 mg/dL). Liver Function Tests: No results for input(s): AST, ALT, ALKPHOS, BILITOT, PROT, ALBUMIN in the last 168 hours. No results for input(s): LIPASE, AMYLASE in the last 168 hours. No results for input(s): AMMONIA in the last 168 hours. Coagulation Profile: No results for input(s): INR, PROTIME in the last 168 hours. Cardiac Enzymes: No results for input(s): CKTOTAL, CKMB, CKMBINDEX, TROPONINI in the last 168 hours. BNP  (last 3 results) No results for input(s): PROBNP in the last 8760 hours. HbA1C: No results for input(s): HGBA1C in the last 72 hours. CBG: Recent Labs  Lab 11/01/19 0601 11/01/19 1131 11/01/19 1624 11/01/19 2106 11/02/19 0610  GLUCAP 108* 206* 138* 268* 159*   Lipid Profile: No results for input(s): CHOL, HDL, LDLCALC, TRIG, CHOLHDL, LDLDIRECT in the last 72 hours. Thyroid Function Tests: No results for input(s): TSH, T4TOTAL, FREET4, T3FREE, THYROIDAB in the last 72 hours. Anemia Panel: No results for input(s): VITAMINB12, FOLATE, FERRITIN, TIBC, IRON, RETICCTPCT in the last 72 hours. Sepsis Labs: No results for input(s): PROCALCITON, LATICACIDVEN in the last 168 hours.  No results found for this or any previous visit (from the past 240 hour(s)).    Radiology Studies: No results found.    Scheduled Meds: .  stroke: mapping our early stages of recovery book   Does not apply Once  . amantadine  100 mg Oral BID  . amLODipine  10 mg Oral Daily  . atorvastatin  40 mg Oral Daily  . carvedilol  25 mg Oral BID WC  . chlorhexidine  15 mL Mouth Rinse BID  . Chlorhexidine Gluconate Cloth  6 each Topical Daily  . cloNIDine  0.1 mg Oral BID  . docusate  100 mg Oral BID  . enoxaparin (LOVENOX) injection  40 mg Subcutaneous Q24H  . hydrALAZINE  100 mg Oral Q8H  . insulin aspart  0-9 Units Subcutaneous TID WC  . insulin aspart  4 Units Subcutaneous TID WC  . insulin detemir  13 Units Subcutaneous BID  . mouth rinse  15 mL Mouth Rinse q12n4p  . pantoprazole  40 mg Oral QHS  . polyethylene glycol  17 g Oral Daily  . senna-docusate  1 tablet Oral BID   Continuous Infusions:       Assessment/Plan:  1.  Left thalamic intracranial, intraventricular hemorrhage: Likely related to hypertension.  CT head on admission showed acute ICH in the left thalamus (9.6 cc) with intraventricular penetration and associated early dilatation of the lateral ventricles.  MRI head was not performed  due to history of previous aneurysm clips.  CTA of the head on 6/19 did not reveal any recurrent aneurysm.  S/p EVD while in ICU with improvement.  CT head on June 24 showed interval improvement. Patient was not on any antiplatelet or antithrombotic agents prior to admission.  Now awaiting SNF placement and has improved cognitively. If concern for continued hypersomnolence, will repeat CT head this evening.  Hold blood pressure medications.  2.  Obstructive hydrocephalus: Present in initial hospital course as result of above.  Seen by neurosurgery and underwent EVD.  CT head on June 24 showed interval improvement of thalamic hemorrhage and resolution of IVH.  There was no evidence of hydrocephalus. EVD self removed 6/24, no acute issues  and remained stable.  3.  Acute hypoxic respiratory failure: In the setting of problem #1.  Patient was intubated, self extubated on 6/24.  Currently saturating well on room air.  4. Hypernatremia:  Sodium was 150 initially, now improved to 138.  5. Hypertension-patient on Norvasc, Coreg and hydralazine at home.  Treated with cleviprex initially for uncontrolled blood pressure while in ICU for hypertensive emergency possibly causing problem #1.BP now soft with multiple antihypertensives on board including Norvasc, Coreg, hydralazine, clonidine.  Continue Norvasc, Coreg, reduce hydralazine to home dose with holding parameters and discontinue clonidine.  6.  Dysphagia: In the setting of problem #1 and prolonged intubation..  Continue dysphagia 2 diet with nectar thick liquids.  Patient was somnolent on speech therapy follow-up today as well.  They recommend MBS prior to discharge to SNF.  7. Leukocytosis: Likely reactive, could be related to atelectasis versus aspiration..  Afebrile.  UA negative on 6/26.  Chest x-ray on 6/26 also showed bibasilar atelectasis more subsegmental atelectasis in the right lung base.  Continue aspiration precautions and dysphagia diet.  8.  Diabetes mellitus type II, uncontrolled:Hemoglobin A1c 7.7, resume outpatient meds at discharge.Continue Levemir 13 units twice daily, NovoLog 4 units 3 times daily AC, sliding scale insulin  9.  PSVT: Resolved and stable on Coreg. Initially hyperkalemic, now hypokalemic with potassium 3.2. Replace.  10.Hyperlipidemia:LDL 186, goal <70, Continue statin  DVT prophylaxis: SCDs given ICH Code Status: Full code Family / Patient Communication:  Disposition Plan:   Status is: Inpatient  Remains inpatient appropriate because:Unsafe d/c plan   Dispo: The patient is from: Home              Anticipated d/c is to: SNF              Anticipated d/c date is: 2 days              Patient currently is medically stable to d/c.   Time spent: 35 minutes     >50% time spent in discussions with care team and coordination of care.    Alessandra Bevels, MD [facility] Pager in [place]  If 7PM-7AM, please contact night-coverage [email or web address] 11/02/2019, 9:16 AM

## 2019-11-01 NOTE — Progress Notes (Signed)
[facility]                                                                              Patient Demographics  Latoya Cox, is a 54 y.o. female, DOB - 14-Apr-1966, ZOX:[id number]  Admit date - 10/15/2019   Admitting Physician Caryl Pina, MD  Outpatient Primary MD for the patient is Myrlene Broker, MD  Outpatient specialists:   LOS - 17  days   Medical records reviewed and are as summarized below:    Chief Complaint  Patient presents with  . Code Stroke       Brief summary   Per admitting MD: WJX:BJYNWG Medleyis an 54 y.o.femalewith DM, HTN and arthritis,[name] and depressed level of consciousness with garbled speech at home, after complaining of a headacheearlier in the day. Husband told EMS that the patient had been complaining of a headache since the morning, but was with no deficits at that time. At 4:30 PM, she continued to complain about her headache and she also felt sleepy, so she took a nap. Her husband checked on her at 6:30 PM, noted the right sided weakness and depressed level of consciousness, and called EMS. On EMS arrival, they noted the same, as well as right facial droop. She vomited en route and continued to vomit intermittently on arrival to the ED. She was obtunded to somnolent and unable to answer questions intelligibly. STAT CT head was obtained, revealing an ICH originating from the left basal ganglila, extending into the left lateral, third and 4th ventricles, with a small amount of blood in the right lateral ventricle as well. Early stages of hydrocephalus were also noted on CT   Admitted to ICU by Neuro. Seen by NSU-EVD placed Remained in ICU on vent support Weaned and transferred out to Neuro floor with neuro Transferred care to [facility]osp day 9 on 6/27 2/2 concerns for Hypernatremia 154, AKI 42/1.1 and hyperkalemia - resolved at 3.3    Assessment & Plan     Principal problem ICH -L thalamic ICH with IVH s/p EVD, likely hypertensive -CT head showed Acute intraparenchymal hemorrhage in the left thalamus,9.6 cc. Intraventricular penetration. Early dilatation of the lateral ventricles.  -MRI head not performed due to previous aneurysm clips -CTA head 6/19 showed no significant residual or recurrent aneurysm.  -CT head 6/24 showed Interval improvement in leftbasal ganglia hematoma. Resolution ofIVH.No hydrocephalus. -Carotid Dopplers unremarkable -2D echo- EF 60 - 65%. No cardiac source of emboli identified. -LDL 186, hemoglobin A1c 7.7 -No antithrombotic prior to admission, now on no antithrombotics, outpatient follow-up with neurology -Improving cognitively, awaiting SNF placement, no acute issues  Active problems  Obstructive hydrocephalus s/p EVD -CT head showed worsening obstructive hydrocephalus -Neurosurgery consulted, status post EVD, Dr. Venetia Maxon -CT head 6/24 showed Interval improvement in leftbasal ganglia hematoma. Resolution ofIVH.No hydrocephalus. -EVD self removed 6/24, no acute issues  Acute respiratory failure with hypoxia -Patient was intubated, self extubated on 6/24 -No issues, O2 sats 100% on room air  Hypernatremia -Sodium 150, creatinine improved 0.9 -Improved, sodium 138  History of aneurysm -Status post  aneurysm clip, per husband many years ago -CTA head 6/19, no significant residual or recurrent aneurysm   Hypertensive emergency -BP stable, continue Norvasc, Coreg, hydralazine, clonidine -No bradycardia  Hyperlipidemia LDL 186, goal less than seventy Continue statin  Diabetes mellitus type II, uncontrolled Hemoglobin A1c 7.7, resume outpatient meds at discharge Continue Levemir 13 units twice daily, NovoLog 4 units 3 times daily AC, sliding scale insulin  Dysphagia -Due to intubation and stroke -Continue dysphagia 2 diet with nectar thick liquids  Leukocytosis -Unclear etiology, no  fevers, UA negative on 6/26 -Chest x-ray 6/26 showed bibasilar atelectasis including more subsegmental atelectasis in right lung base -High risk of aspiration, continue precautions, upright in the chair -No fevers or signs of infection, follow CBC  Paroxysmal SVT -Resolved, continue Coreg   Code Status: Full CODE STATUS DVT Prophylaxis:   SCD's Family Communication: Discussed all imaging results, lab results, explained to the patient.   Disposition Plan:     Status is: Inpatient  Remains inpatient appropriate because:Inpatient level of care appropriate due to severity of illness   Dispo: The patient is from: Home              Anticipated d/c is to: SNF              Anticipated d/c date is: 1 day              Patient currently is medically stable to d/c.  Pending insurance approval, patient to be discharged to skilled nursing facility    Time Spent in minutes    Procedures:    Consultants:   Neurology Neurosurgery  Antimicrobials:   Anti-infectives (From admission, onward)   None         Medications  Scheduled Meds: .  stroke: mapping our early stages of recovery book   Does not apply Once  . amantadine  100 mg Oral BID  . amLODipine  10 mg Oral Daily  . atorvastatin  40 mg Oral Daily  . carvedilol  25 mg Oral BID WC  . chlorhexidine  15 mL Mouth Rinse BID  . Chlorhexidine Gluconate Cloth  6 each Topical Daily  . cloNIDine  0.1 mg Oral BID  . docusate  100 mg Oral BID  . enoxaparin (LOVENOX) injection  40 mg Subcutaneous Q24H  . hydrALAZINE  100 mg Oral Q8H  . insulin aspart  0-9 Units Subcutaneous TID WC  . insulin aspart  4 Units Subcutaneous TID WC  . insulin detemir  13 Units Subcutaneous BID  . mouth rinse  15 mL Mouth Rinse q12n4p  . pantoprazole  40 mg Oral QHS  . polyethylene glycol  17 g Oral Daily  . senna-docusate  1 tablet Oral BID   Continuous Infusions:  PRN Meds:.acetaminophen **OR** [DISCONTINUED] acetaminophen (TYLENOL) oral  liquid 160 mg/5 mL **OR** acetaminophen, hydrALAZINE, labetalol      Subjective:   Lujuana Kapler was seen and examined today.  No acute complaints, pleasant and cooperative, no acute neurological deficits.  No issues overnight.  No abdominal pain, nausea vomiting or diarrhea.  No chest pain or shortness of breath.   Objective:   Vitals:   11/01/19 0008 11/01/19 0418 11/01/19 0500 11/01/19 0800  BP: (!) 145/92 (!) 140/93  (!) 142/92  Pulse: 77 80  84  Resp: Temp: 98.3 F (36.8 C) 98.4 F (36.9 C)  98.5 F (36.9 C)  TempSrc: Oral Oral  Oral  SpO2: 100% 100%  100%  Weight:   78.4 kg   Height:       No intake or output data in the 24 hours ending 11/01/19 1028   Wt Readings from Last 3 Encounters:  11/01/19 78.4 kg  10/05/19 78 kg  01/14/19 80.3 kg    Physical Exam  General: Alert and oriented x self, place, NAD,  Cardiovascular: S1 S2 clear, RRR. No pedal edema b/l  Respiratory: CTAB, no wheezing, rales or rhonchi  Gastrointestinal: Soft, nontender, nondistended, NBS  Ext: no pedal edema bilaterally  Neuro: no new deficits  Musculoskeletal: No cyanosis, clubbing  Skin: No rashes  Psych: Much more alert, and pleasant today.  Cognitive deficits improving      Data Reviewed:  I have personally reviewed following labs and imaging studies  Micro Results No results found for this or any previous visit (from the past 240 hour(s)).  Radiology Reports CT ANGIO HEAD W OR WO CONTRAST  Result Date: 10/17/2019 CLINICAL DATA:  Intraparenchymal hemorrhage. EXAM: CT ANGIOGRAPHY HEAD TECHNIQUE: Multidetector CT imaging of the head was performed using the standard protocol during bolus administration of intravenous contrast. Multiplanar CT image reconstructions and MIPs were obtained to evaluate the vascular anatomy. CONTRAST:  75mL OMNIPAQUE IOHEXOL 350 MG/ML SOLN COMPARISON:  CT head without contrast 10/16/2019 at 1:48 a.m. FINDINGS: CT HEAD Brain:  Hemorrhage centered in the left thalamus is stable in size. Intraventricular blood has increased. Left frontal ventriculostomy catheter is in place. Midline shift is slightly more prominent. Blood is seen in the third and fourth ventricles. No new parenchymal hemorrhage is present. Vascular: Aneurysm clip is again noted. Minimal vascular calcifications are present. No hyperdense vessel is evident. Skull: Craniotomy is noted. Calvarium is otherwise within normal limits. Sinuses: The paranasal sinuses and mastoid air cells are clear. Orbits: The globes and orbits are within normal limits. CTA HEAD Anterior circulation: Atherosclerotic changes are noted within the cavernous internal carotid arteries. No significant stenosis is present. Aneurysm clip is in place. No significant residual recurrent aneurysm is present. The A1 and M1 segments are normal. The anterior communicating artery is patent. MCA bifurcations are intact. ACA and MCA branch vessels are unremarkable. Posterior circulation: The vertebral arteries are codominant. PICA origins are visualized and normal. The basilar artery is normal. Both posterior cerebral arteries originate from basilar tip. The PCA branch vessels are within normal limits. Venous sinuses: The dural sinuses are patent. The straight sinus and deep cerebral veins patent. Cortical veins are unremarkable. Anatomic variants: None IMPRESSION: 1. Stable size of left thalamic hemorrhage. 2. Increased intraventricular blood. With slight increase in midline shift 3. Left frontal ventriculostomy catheter is in place. 4. No significant residual or recurrent aneurysm. 5. No significant proximal stenosis, aneurysm, or branch vessel occlusion within the Circle of Willis. Normal CTA of the head. No focal etiology for the hemorrhage. Electronically Signed   By: Marin Roberts M.D.   On: 10/17/2019 07:13   CT HEAD WO CONTRAST  Result Date: 10/22/2019 CLINICAL DATA:  Intracranial hemorrhage.   Encephalopathy EXAM: CT HEAD WITHOUT CONTRAST TECHNIQUE: Contiguous axial images were obtained from the base of the skull through the vertex without intravenous contrast. COMPARISON:  CT head 10/17/2019 FINDINGS: Brain: Left medial basal ganglia and thalamic hemorrhage shows interval improvement. No new hemorrhage. No intraventricular hemorrhage is present on today's study. Left ventricular drainage catheter is been removed. No hydrocephalus. Mild midline shift to the right due to the hematoma. Small subdural hygromas along the tentorium bilaterally unchanged. Negative for acute infarct or  mass. Vascular: Negative for hyperdense vessel. Aneurysm clip left ophthalmic artery region Skull: Left pterional craniotomy.  No acute skeletal abnormality. Sinuses/Orbits: Mild mucosal edema paranasal sinuses. Negative orbit. Other: None IMPRESSION: Interval improvement in left basal ganglia hematoma. Resolution of intraventricular hemorrhage. Left ventricular drain has been removed.  No hydrocephalus. Electronically Signed   By: Marlan Palau M.D.   On: 10/22/2019 21:06   CT HEAD WO CONTRAST  Result Date: 10/16/2019 CLINICAL DATA:  Follow-up examination for intracranial hemorrhage. EXAM: CT HEAD WITHOUT CONTRAST TECHNIQUE: Contiguous axial images were obtained from the base of the skull through the vertex without intravenous contrast. COMPARISON:  Prior CT from 10/15/2019. FINDINGS: Brain: Acute intraparenchymal hemorrhage emanating from the left thalamus again seen, not significantly changed in size and morphology as compared to previous exam. This measures 3.4 x 3.1 x 2.0 cm (estimated volume 11 cc). Mildly increased localized edema with trace 5 mm localized left-to-right shift at the septum pellucidum. Associated intraventricular extension with blood seen throughout the ventricular system. Degree of intraventricular blood is similar. Associated obstructive hydrocephalus appears slightly worsened from previous. No other  acute intracranial hemorrhage. No acute large vessel territory infarct. No extra-axial fluid collection or visible mass lesion. Vascular: No hyperdense vessel. Aneurysm clip position near the left ICA terminus again noted. Skull: No scalp soft tissue abnormality. Prior left frontal craniotomy. Sinuses/Orbits: Globes and orbital soft tissues within normal limits. Paranasal sinuses and mastoid air cells remain clear. Other: None. IMPRESSION: 1. No significant interval change in size and morphology of acute intraparenchymal hemorrhage emanating from the left thalamus, estimated volume 11 CC. Mildly increased localized edema with trace 5 mm localized left-to-right shift at the septum pellucidum. 2. Associated intraventricular extension with blood throughout the ventricular system, similar to previous. Associated obstructive hydrocephalus appears slightly worsened from previous. 3. No other new acute intracranial abnormality. Electronically Signed   By: Rise Mu M.D.   On: 10/16/2019 02:11   DG CHEST PORT 1 VIEW  Result Date: 10/24/2019 CLINICAL DATA:  Stroke EXAM: PORTABLE CHEST 1 VIEW COMPARISON:  Radiograph 10/20/2019 FINDINGS: Persistent bandlike opacities in right lung base favoring subsegmental atelectatic change. Additional hazy areas of basilar atelectasis bilaterally. No focal consolidation, pneumothorax, effusion or convincing features of edema. Interval removal of the transesophageal endotracheal tubes. Telemetry leads remain over the chest. High attenuation contrast material is noted in the colon at the level of the splenic flexure. Soft tissues and osseous structures are otherwise unremarkable. IMPRESSION: 1. Interval removal of the transesophageal and endotracheal tubes. 2. Bibasilar atelectasis including more subsegmental atelectasis in the right lung base. Electronically Signed   By: Kreg Shropshire M.D.   On: 10/24/2019 23:07   DG CHEST PORT 1 VIEW  Result Date: 10/20/2019 CLINICAL DATA:   Intubated. EXAM: PORTABLE CHEST 1 VIEW COMPARISON:  Chest x-ray dated October 18, 2019. FINDINGS: Unchanged endotracheal and enteric tubes. Stable cardiomediastinal silhouette. Normal pulmonary vascularity. Low lung volumes with mild bibasilar atelectasis. No focal consolidation, pleural effusion, or pneumothorax. No acute osseous abnormality. IMPRESSION: 1. Stable support tubes.  No active disease. Electronically Signed   By: Obie Dredge M.D.   On: 10/20/2019 11:55   DG Chest Port 1 View  Result Date: 10/18/2019 CLINICAL DATA:  Respiratory failure.  Evaluate pneumo. EXAM: PORTABLE CHEST 1 VIEW COMPARISON:  October 16, 2019 FINDINGS: The ETT is in good position. The NG tube terminates below today's film. No pneumothorax. The lungs are clear. Stable cardiomegaly. The hila and mediastinum are unchanged. IMPRESSION: 1. Support apparatus  as above. 2. No other acute abnormalities. Electronically Signed   By: Gerome Sam III M.D   On: 10/18/2019 12:05   DG CHEST PORT 1 VIEW  Result Date: 10/16/2019 CLINICAL DATA:  ET tube and OG tube placed EXAM: PORTABLE CHEST 1 VIEW COMPARISON:  June 29, 2016 FINDINGS: The heart size and mediastinal contours are within normal limits. Probable subsegmental atelectasis seen at the right lung base. ETT is 2.8 cm above the carina. NG tube is seen below the diaphragm within the stomach. The visualized skeletal structures are unremarkable. IMPRESSION: ET tube and NG tube in satisfactory position. Subsegmental atelectasis at the right lung base. Electronically Signed   By: Jonna Clark M.D.   On: 10/16/2019 06:55   DG Swallowing Func-Speech Pathology  Result Date: 10/23/2019 Objective Swallowing Evaluation: Type of Study: MBS-Modified Barium Swallow Study  Patient Details Name: Lorah Kalina MRN: [id number] Date of Birth: December 05, 1965 Today's Date: 10/23/2019 Time: SLP Start Time (ACUTE ONLY): 1339 -SLP Stop Time (ACUTE ONLY): 1357 SLP Time Calculation (min) (ACUTE ONLY): 18 min Past  Medical History: Past Medical History: Diagnosis Date . Arthritis  . Chest wall pain  . Diabetes mellitus without complication (HCC)  . Hypertension  Past Surgical History: Past Surgical History: Procedure Laterality Date . aneurism repair   . lapband   HPI: Pt with acute headache and slurred speech, brought by EMS to hospital. CT head was obtained, revealing an ICH originating from the left basal ganglila, extending into the left lateral, third and 4th ventricles, with a small amount of blood in the right lateral ventricle as well. Early stages of hydrocephalus were also noted on CT. Acute hypoxia from respiratory failure, requiring intubation on 10/16/19. Extubated 10/21/19.  Subjective: alert, cooperative, needs cues Assessment / Plan / Recommendation CHL IP CLINICAL IMPRESSIONS 10/23/2019 Clinical Impression Pt has a mild oropharyngeal dysphagia. Orally, she has premature spillage of thin liquids and mildly prolonged mastication and transit of solids. Her pharyngeal phase is relatively more functional, but when drinking consecutively via cup or straw, there is trace, silent aspiration of thin liquids that spilled prematurely into the pyriform sinuses. No aspiration is observed with any other consistency. Also considering her mentation, recommend starting with Dys 2 (chopped) diet and nectar thick liquids with good potential to progress given additional time post-extubation and for cognitive therapy.  SLP Visit Diagnosis Dysphagia, oropharyngeal phase (R13.12) Attention and concentration deficit following -- Frontal lobe and executive function deficit following -- Impact on safety and function Mild aspiration risk   CHL IP TREATMENT RECOMMENDATION 10/23/2019 Treatment Recommendations Therapy as outlined in treatment plan below   Prognosis 10/23/2019 Prognosis for Safe Diet Advancement Good Barriers to Reach Goals Cognitive deficits Barriers/Prognosis Comment -- CHL IP DIET RECOMMENDATION 10/23/2019 SLP Diet  Recommendations Dysphagia 2 (Fine chop) solids;Nectar thick liquid Liquid Administration via Cup;Straw Medication Administration Crushed with puree Compensations Minimize environmental distractions Postural Changes Seated upright at 90 degrees   CHL IP OTHER RECOMMENDATIONS 10/23/2019 Recommended Consults -- Oral Care Recommendations Oral care BID Other Recommendations Order thickener from pharmacy;Prohibited food (jello, ice cream, thin soups);Remove water pitcher   CHL IP FOLLOW UP RECOMMENDATIONS 10/23/2019 Follow up Recommendations Inpatient Rehab   CHL IP FREQUENCY AND DURATION 10/23/2019 Speech Therapy Frequency (ACUTE ONLY) min 2x/week Treatment Duration 2 weeks      CHL IP ORAL PHASE 10/23/2019 Oral Phase Impaired Oral - Pudding Teaspoon -- Oral - Pudding Cup -- Oral - Honey Teaspoon -- Oral - Honey Cup -- Oral - Nectar Teaspoon --  Oral - Nectar Cup WFL Oral - Nectar Straw WFL Oral - Thin Teaspoon -- Oral - Thin Cup Premature spillage Oral - Thin Straw Premature spillage Oral - Puree WFL Oral - Mech Soft Impaired mastication;Delayed oral transit Oral - Regular -- Oral - Multi-Consistency -- Oral - Pill -- Oral Phase - Comment --  CHL IP PHARYNGEAL PHASE 10/23/2019 Pharyngeal Phase Impaired Pharyngeal- Pudding Teaspoon -- Pharyngeal -- Pharyngeal- Pudding Cup -- Pharyngeal -- Pharyngeal- Honey Teaspoon -- Pharyngeal -- Pharyngeal- Honey Cup -- Pharyngeal -- Pharyngeal- Nectar Teaspoon -- Pharyngeal -- Pharyngeal- Nectar Cup WFL Pharyngeal -- Pharyngeal- Nectar Straw WFL Pharyngeal -- Pharyngeal- Thin Teaspoon -- Pharyngeal -- Pharyngeal- Thin Cup Reduced airway/laryngeal closure;Penetration/Aspiration during swallow Pharyngeal Material enters airway, passes BELOW cords without attempt by patient to eject out (silent aspiration) Pharyngeal- Thin Straw Reduced airway/laryngeal closure;Penetration/Aspiration during swallow Pharyngeal Material enters airway, passes BELOW cords without attempt by patient to eject out  (silent aspiration) Pharyngeal- Puree WFL Pharyngeal -- Pharyngeal- Mechanical Soft WFL Pharyngeal -- Pharyngeal- Regular -- Pharyngeal -- Pharyngeal- Multi-consistency -- Pharyngeal -- Pharyngeal- Pill -- Pharyngeal -- Pharyngeal Comment --  CHL IP CERVICAL ESOPHAGEAL PHASE 10/23/2019 Cervical Esophageal Phase WFL Pudding Teaspoon -- Pudding Cup -- Honey Teaspoon -- Honey Cup -- Nectar Teaspoon -- Nectar Cup -- Nectar Straw -- Thin Teaspoon -- Thin Cup -- Thin Straw -- Puree -- Mechanical Soft -- Regular -- Multi-consistency -- Pill -- Cervical Esophageal Comment -- Mahala Menghini., M.A. CCC-SLP Acute Rehabilitation Services Pager [phone] Office [phone] 10/23/2019, 3:28 PM              ECHOCARDIOGRAM COMPLETE  Result Date: 10/16/2019    ECHOCARDIOGRAM REPORT   Patient Name:   SERIAH Hummel Date of Exam: 10/16/2019 Medical Rec #:  [id number]     Height:       62.0 in Accession #:    [id number]    Weight:       172.0 lb Date of Birth:  1965/08/04      BSA:          1.793 m Patient Age:    54 years      BP:           128/62 mmHg Patient Gender: F             HR:           99 bpm. Exam Location:  Inpatient Procedure: 2D Echo, Color Doppler, Cardiac Doppler and Intracardiac            Opacification Agent Indications:    Stroke i163.9  History:        Patient has no prior history of Echocardiogram examinations.                 Risk Factors:Hypertension and Diabetes.  Sonographer:    Irving Burton Senior RDCS Referring Phys: [id number] Marvel Plan  Sonographer Comments: Echo performed with patient supine and on artificial respirator. IMPRESSIONS  1. Left ventricular ejection fraction, by estimation, is 60 to 65%. The left ventricle has normal function. The left ventricle has no regional wall motion abnormalities. There is mild concentric left ventricular hypertrophy. Left ventricular diastolic parameters are consistent with Grade I diastolic dysfunction (impaired relaxation).  2. Right ventricular systolic function is normal. The  right ventricular size is normal.  3. The mitral valve is normal in structure. No evidence of mitral valve regurgitation. No evidence of mitral stenosis.  4. The aortic valve is normal in structure. Aortic valve regurgitation is not visualized.  No aortic stenosis is present.  5. Aortic dilatation noted. There is borderline dilatation of the aortic root.  6. The inferior vena cava is normal in size with greater than 50% respiratory variability, suggesting right atrial pressure of 3 mmHg. FINDINGS  Left Ventricle: Left ventricular ejection fraction, by estimation, is 60 to 65%. The left ventricle has normal function. The left ventricle has no regional wall motion abnormalities. Definity contrast agent was given IV to delineate the left ventricular  endocardial borders. The left ventricular internal cavity size was normal in size. There is mild concentric left ventricular hypertrophy. Left ventricular diastolic parameters are consistent with Grade I diastolic dysfunction (impaired relaxation). Right Ventricle: The right ventricular size is normal. No increase in right ventricular wall thickness. Right ventricular systolic function is normal. Left Atrium: Left atrial size was normal in size. Right Atrium: Right atrial size was normal in size. Pericardium: There is no evidence of pericardial effusion. Mitral Valve: The mitral valve is normal in structure. Normal mobility of the mitral valve leaflets. No evidence of mitral valve regurgitation. No evidence of mitral valve stenosis. Tricuspid Valve: The tricuspid valve is normal in structure. Tricuspid valve regurgitation is not demonstrated. No evidence of tricuspid stenosis. Aortic Valve: The aortic valve is normal in structure. Aortic valve regurgitation is not visualized. No aortic stenosis is present. Pulmonic Valve: The pulmonic valve was normal in structure. Pulmonic valve regurgitation is not visualized. No evidence of pulmonic stenosis. Aorta: Aortic dilatation  noted. There is borderline dilatation of the aortic root. Venous: The inferior vena cava is normal in size with greater than 50% respiratory variability, suggesting right atrial pressure of 3 mmHg. IAS/Shunts: No atrial level shunt detected by color flow Doppler.  LEFT VENTRICLE PLAX 2D LVIDd:         4.40 cm LVIDs:         2.90 cm LV PW:         1.30 cm LV IVS:        1.20 cm LVOT diam:     2.20 cm LV SV:         49 LV SV Index:   27 LVOT Area:     3.80 cm  RIGHT VENTRICLE RV S prime:     18.30 cm/s LEFT ATRIUM             Index       RIGHT ATRIUM           Index LA diam:        2.90 cm 1.62 cm/m  RA Area:     14.90 cm LA Vol (A2C):   57.1 ml 31.85 ml/m RA Volume:   36.90 ml  20.58 ml/m LA Vol (A4C):   68.0 ml 37.93 ml/m LA Biplane Vol: 65.0 ml 36.25 ml/m  AORTIC VALVE LVOT Vmax:   89.23 cm/s LVOT Vmean:  58.733 cm/s LVOT VTI:    0.128 m  AORTA Ao Root diam: 3.90 cm Ao Asc diam:  3.40 cm  SHUNTS Systemic VTI:  0.13 m Systemic Diam: 2.20 cm Rachelle HoraMihai Croitoru MD Electronically signed by Thurmon FairMihai Croitoru MD Signature Date/Time: 10/16/2019/11:50:40 AM    Final    CT HEAD CODE STROKE WO CONTRAST  Result Date: 10/15/2019 CLINICAL DATA:  Code stroke.  Altered mental status.  Headache. EXAM: CT HEAD WITHOUT CONTRAST TECHNIQUE: Contiguous axial images were obtained from the base of the skull through the vertex without intravenous contrast. COMPARISON:  Head CT 08/26/2018 FINDINGS: Brain: There is acute parenchymal hemorrhage  with the epicenter in the left thalamus. The hematoma measures 3.2 x 2.3 x 2.5 cm (volume = 9.6 cm^3). There is intraventricular penetration with blood filling the third ventricle and nearly filling the fourth ventricle. Small amount in the frontal horns of the lateral ventricles. No surrounding edema at this time. Lateral ventricles are dilated compared to the study of April 2020. Elsewhere, there chronic small-vessel ischemic changes of white matter. No large vessel territory stroke. No sign of  mass. No extra-axial collection. 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 Skull: Previous left pterional craniotomy. Sinuses/Orbits: Clear/normal Other: None ASPECTS (Alberta Stroke Program Early CT Score) - Ganglionic level infarction (caudate, lentiform nuclei, internal capsule, insula, M1-M3 cortex): 7 - Supraganglionic infarction (M4-M6 cortex): 3 Total score (0-10 with 10 being normal): 10 IMPRESSION: 1. Acute intraparenchymal hemorrhage in the left thalamus, 9.6 cc. Intraventricular penetration. Early dilatation of the lateral ventricles. 2. ASPECTS is 10 3. These results were communicated to Dr. Otelia Limes at 7:38 pmon 6/17/2021by text page via the [facility] messaging system. Electronically Signed   By: Paulina Fusi M.D.   On: 10/15/2019 19:40   VAS US CAROTID  Result Date: 10/19/2019 Carotid Arterial Duplex Study Indications:       CVA. Risk Factors:      Hypertension, Diabetes. Limitations        Today's exam was limited due to patient positioning. Comparison Study:  no prior Performing Technologist: Blanch Media RVS  Examination Guidelines: A complete evaluation includes B-mode imaging, spectral Doppler, color Doppler, and power Doppler as needed of all accessible portions of each vessel. Bilateral testing is considered an integral part of a complete examination. Limited examinations for reoccurring indications may be performed as noted.  Right Carotid Findings: +----------+--------+--------+--------+------------------+--------+           PSV cm/sEDV cm/sStenosisPlaque DescriptionComments +----------+--------+--------+--------+------------------+--------+ CCA Distal51      14                                         +----------+--------+--------+--------+------------------+--------+ ICA Prox  52      12      1-39%   heterogenous               +----------+--------+--------+--------+------------------+--------+ ECA       28      7                                           +----------+--------+--------+--------+------------------+--------+ +----------+--------+-------+--------+-------------------+           PSV cm/sEDV cmsDescribeArm Pressure (mmHG) +----------+--------+-------+--------+-------------------+ [id number]                                         +----------+--------+-------+--------+-------------------+ +---------+--------+--------+--------------+ VertebralPSV cm/sEDV cm/sNot identified +---------+--------+--------+--------------+  Left Carotid Findings: +----------+--------+--------+--------+------------------+--------+           PSV cm/sEDV cm/sStenosisPlaque DescriptionComments +----------+--------+--------+--------+------------------+--------+ CCA Prox  83      12              heterogenous               +----------+--------+--------+--------+------------------+--------+ CCA Distal54      13  heterogenous               +----------+--------+--------+--------+------------------+--------+ ICA Prox  49      15      1-39%   heterogenous               +----------+--------+--------+--------+------------------+--------+ ICA Distal50      17                                         +----------+--------+--------+--------+------------------+--------+ ECA       60      8                                          +----------+--------+--------+--------+------------------+--------+ +----------+--------+--------+--------+-------------------+           PSV cm/sEDV cm/sDescribeArm Pressure (mmHG) +----------+--------+--------+--------+-------------------+ [id number]                                          +----------+--------+--------+--------+-------------------+ +---------+--------+--+--------+--+---------+ VertebralPSV cm/s44EDV cm/s11Antegrade +---------+--------+--+--------+--+---------+   Summary: Right Carotid: Velocities in the right ICA are consistent with a 1-39%  stenosis. Left Carotid: Velocities in the left ICA are consistent with a 1-39% stenosis. Vertebrals: Left vertebral artery demonstrates antegrade flow. Right vertebral             artery was not visualized. *See table(s) above for measurements and observations.  Electronically signed by Delia Heady MD on 10/19/2019 at 1:33:17 PM.    Final     Lab Data:  CBC: Recent Labs  Lab 10/26/19 0302 10/27/19 0307 10/28/19 0442 10/29/19 0316 10/31/19 0438  WBC 14.5* 15.9* 15.3* 13.0* 11.6*  HGB 12.6 13.0 12.3 11.4* 12.0  HCT 41.3 41.9 39.0 36.5 38.1  MCV 87.5 86.9 84.1 84.3 85.6  PLT 275 275 250 217 275   Basic Metabolic Panel: Recent Labs  Lab 10/26/19 0302 10/27/19 0307 10/28/19 0442 10/29/19 0316 10/30/19 0350 10/31/19 0438  NA 150* 138 139 139  --  138  K 3.7 5.7* 2.9* 2.8*  --  3.2*  CL 118* 109 106 105  --  103  CO2 22 19* 24 25  --  26  GLUCOSE 194* 167* 151* 185*  --  214*  BUN 28* 18 10 14   --  14  CREATININE 0.96 0.69 0.76 0.86  --  0.88  CALCIUM 8.4* 8.2* 8.2* 8.3*  --  8.5*  MG  --   --  1.8 1.9 1.9 2.0   GFR: Estimated Creatinine Clearance: 72.5 mL/min (by C-G formula based on SCr of 0.88 mg/dL). Liver Function Tests: No results for input(s): AST, ALT, ALKPHOS, BILITOT, PROT, ALBUMIN in the last 168 hours. No results for input(s): LIPASE, AMYLASE in the last 168 hours. No results for input(s): AMMONIA in the last 168 hours. Coagulation Profile: No results for input(s): INR, PROTIME in the last 168 hours. Cardiac Enzymes: No results for input(s): CKTOTAL, CKMB, CKMBINDEX, TROPONINI in the last 168 hours. BNP (last 3 results) No results for input(s): PROBNP in the last 8760 hours. HbA1C: No results for input(s): HGBA1C in the last 72 hours. CBG: Recent Labs  Lab 10/31/19 0651 10/31/19 1106 10/31/19 1534 10/31/19 2121 11/01/19 0601  GLUCAP 168* 241* 176* 93 108*  Lipid Profile: No results for input(s): CHOL, HDL, LDLCALC, TRIG, CHOLHDL, LDLDIRECT in the  last 72 hours. Thyroid Function Tests: No results for input(s): TSH, T4TOTAL, FREET4, T3FREE, THYROIDAB in the last 72 hours. Anemia Panel: No results for input(s): VITAMINB12, FOLATE, FERRITIN, TIBC, IRON, RETICCTPCT in the last 72 hours. Urine analysis:    Component Value Date/Time   COLORURINE YELLOW 10/24/2019 1618   APPEARANCEUR CLEAR 10/24/2019 1618   LABSPEC 1.029 10/24/2019 1618   PHURINE 5.0 10/24/2019 1618   GLUCOSEU >=500 (A) 10/24/2019 1618   HGBUR NEGATIVE 10/24/2019 1618   BILIRUBINUR NEGATIVE 10/24/2019 1618   KETONESUR 5 (A) 10/24/2019 1618   PROTEINUR 30 (A) 10/24/2019 1618   UROBILINOGEN 0.2 09/04/2014 1850   NITRITE NEGATIVE 10/24/2019 1618   LEUKOCYTESUR TRACE (A) 10/24/2019 1618     Consuelo Suthers M.D. [facility] 11/01/2019, 10:28 AM   Call night coverage person covering after 7pm

## 2019-11-02 ENCOUNTER — Inpatient Hospital Stay (HOSPITAL_COMMUNITY): Payer: No Typology Code available for payment source

## 2019-11-02 DIAGNOSIS — I610 Nontraumatic intracerebral hemorrhage in hemisphere, subcortical: Secondary | ICD-10-CM | POA: Diagnosis not present

## 2019-11-02 DIAGNOSIS — J9601 Acute respiratory failure with hypoxia: Secondary | ICD-10-CM | POA: Diagnosis not present

## 2019-11-02 DIAGNOSIS — R400 Somnolence: Secondary | ICD-10-CM | POA: Diagnosis not present

## 2019-11-02 DIAGNOSIS — G911 Obstructive hydrocephalus: Secondary | ICD-10-CM | POA: Diagnosis not present

## 2019-11-02 DIAGNOSIS — I10 Essential (primary) hypertension: Secondary | ICD-10-CM

## 2019-11-02 LAB — GLUCOSE, CAPILLARY
Glucose-Capillary: 159 mg/dL — ABNORMAL HIGH (ref 70–99)
Glucose-Capillary: 256 mg/dL — ABNORMAL HIGH (ref 70–99)
Glucose-Capillary: 272 mg/dL — ABNORMAL HIGH (ref 70–99)
Glucose-Capillary: 112 mg/dL — ABNORMAL HIGH (ref 70–99)

## 2019-11-02 LAB — BASIC METABOLIC PANEL
Anion gap: 10 (ref 5–15)
BUN: 15 mg/dL (ref 6–20)
CO2: 26 mmol/L (ref 22–32)
Calcium: 8.5 mg/dL — ABNORMAL LOW (ref 8.9–10.3)
Chloride: 104 mmol/L (ref 98–111)
Creatinine, Ser: 0.91 mg/dL (ref 0.44–1.00)
GFR calc Af Amer: >60 mL/min (ref >60)
GFR calc non Af Amer: >60 mL/min (ref >60)
Glucose, Bld: 167 mg/dL — ABNORMAL HIGH (ref 70–99)
Potassium: 3.2 mmol/L — ABNORMAL LOW (ref 3.5–5.1)
Sodium: 140 mmol/L (ref 135–145)

## 2019-11-02 LAB — CBC
HCT: 36.4 % (ref 36.0–46.0)
Hemoglobin: 11.4 g/dL — ABNORMAL LOW (ref 12.0–15.0)
MCH: 26.6 pg (ref 26.0–34.0)
MCHC: 31.3 g/dL (ref 30.0–36.0)
MCV: 85 fL (ref 80.0–100.0)
Platelets: 266 10*3/uL (ref 150–400)
RBC: 4.28 MIL/uL (ref 3.87–5.11)
RDW: 14.1 % (ref 11.5–15.5)
WBC: 10.7 10*3/uL — ABNORMAL HIGH (ref 4.0–10.5)
nRBC: 0 % (ref 0.0–0.2)

## 2019-11-02 MED ORDER — HYDRALAZINE HCL 25 MG PO TABS
25.0000 mg | ORAL_TABLET | Freq: Three times a day (TID) | ORAL | Status: DC
  Administered 2019-11-02 – 2019-11-14 (×37): 25 mg via ORAL
  Filled 2019-11-02 (×37): qty 1

## 2019-11-02 MED ORDER — POTASSIUM CHLORIDE 20 MEQ PO PACK
40.0000 meq | PACK | Freq: Once | ORAL | Status: AC
  Administered 2019-11-02: 40 meq via ORAL
  Filled 2019-11-02: qty 2

## 2019-11-02 NOTE — Progress Notes (Addendum)
Physical Therapy Treatment Patient Details Name: Latoya Cox MRN: [id number] DOB: 05-11-1965 Today's Date: 11/02/2019    History of Present Illness 54 y.o. female with DM, HTN and arthritis, [name] and depressed level of consciousness with garbled speech at home, after complaining of a headache earlier in the day. STAT CT head was obtained, revealing an ICH originating from the left basal ganglila, extending into the left lateral, third and 4th ventricles, with a small amount of blood in the right lateral ventricle as well. Early stages of hydrocephalus were also noted on CT. Pt underwent L frontal IVC placement and intubation on 6/18. Pt extubated on 6/23.    PT Comments    Pt received in bed, very lethargic requiring continual cues to keep eyes open and participate. She required mod assist bed mobility, min assist +2 safety transfers, and min assist +2 safety ambulation 5' with RW. Pt ambulated to sink to perform grooming tasks with OT. While standing at sink, pt presenting with increasing lethargic as well as decreased HR (113-81). Recliner was positioned behind pt and she was assisted to sitting. BP taken and found to be 75/61. Pt then assisted back to bed. BP 91/63 after return to supine.  RN notified.   Pt/husband declining CIR due to payer source, not covered by insurance policy so would be private pay. Discharge recommendation updated to SNF.   Follow Up Recommendations  SNF     Equipment Recommendations  Other (comment) (defer to next venue)    Recommendations for Other Services       Precautions / Restrictions Precautions Precautions: Fall Restrictions Weight Bearing Restrictions: No    Mobility  Bed Mobility Overal bed mobility: Needs Assistance Bed Mobility: Supine to Sit;Sit to Supine     Supine to sit: Min assist;HOB elevated Sit to supine: Mod assist   General bed mobility comments: cues for  sequencing, increased assist back to bed due to symptomatic orthostatic BP  Transfers Overall transfer level: Needs assistance Equipment used: Rolling walker (2 wheeled) Transfers: Sit to/from [employer] Sit to Stand: Min assist;+2 safety/equipment Stand pivot transfers: Min assist;+2 safety/equipment       General transfer comment: cues for hand placement and sequencing  Ambulation/Gait Ambulation/Gait assistance: Min assist;+2 safety/equipment Gait Distance (Feet): 5 Feet Assistive device: Rolling walker (2 wheeled) Gait Pattern/deviations: Step-through pattern;Decreased stride length Gait velocity: decreased Gait velocity interpretation: <1.8 ft/sec, indicate of risk for recurrent falls General Gait Details: Pt ambulated to sink to perform grooming tasks with OT. Pt very lethargic. Decreased HR noted while standing (113-81). Pt transitioned in sitting in recliner to assess BP. BP 75/61. Therefore, further gait deferred and pt assisted back to bed.   Stairs             Wheelchair Mobility    Modified Rankin (Stroke Patients Only)       Balance Overall balance assessment: Needs assistance Sitting-balance support: No upper extremity supported;Feet supported Sitting balance-Leahy Scale: Fair     Standing balance support: Bilateral upper extremity supported;During functional activity Standing balance-Leahy Scale: Poor Standing balance comment: reliant on external support                            Cognition Arousal/Alertness: Lethargic Behavior During Therapy: Flat affect Overall Cognitive Status: Impaired/Different from baseline Area of Impairment: Attention;Memory;Following commands;Safety/judgement;Awareness;Problem solving;Orientation  Orientation Level: Disoriented to;Time;Situation Current Attention Level: Sustained Memory: Decreased recall of precautions;Decreased short-term memory Following Commands:  Follows one step commands inconsistently;Follows one step commands with increased time Safety/Judgement: Decreased awareness of safety;Decreased awareness of deficits Awareness: Intellectual Problem Solving: Slow processing;Decreased initiation;Difficulty sequencing;Requires verbal cues;Requires tactile cues General Comments: Pt repeatedly asking how long she had been 'like this.' Continual cues needed to stay on task and to stay awake. Pt keeping eys closed.      Exercises      General Comments General comments (skin integrity, edema, etc.): initial BP 106/72 supine, BP in sitting (after amb to sink) 75/61, BP after return to flat supine 91/63, BP with HOB at 25 degrees 89/61      Pertinent Vitals/Pain Pain Assessment: Faces Faces Pain Scale: Hurts a little bit Pain Location: generalized Pain Descriptors / Indicators: Grimacing Pain Intervention(s): Monitored during session;Repositioned    Home Living                      Prior Function            PT Goals (current goals can now be found in the care plan section) Acute Rehab PT Goals Patient Stated Goal: not stated PT Goal Formulation: Patient unable to participate in goal setting Time For Goal Achievement: 11/14/19 Potential to Achieve Goals: Fair Progress towards PT goals: Progressing toward goals    Frequency    Min 3X/week      PT Plan Discharge plan needs to be updated;Frequency needs to be updated    Co-evaluation PT/OT/SLP Co-Evaluation/Treatment: Yes Reason for Co-Treatment: Complexity of the patient's impairments (multi-system involvement);For patient/therapist safety;To address functional/ADL transfers;Necessary to address cognition/behavior during functional activity PT goals addressed during session: Mobility/safety with mobility;Balance;Proper use of DME OT goals addressed during session: ADL's and self-care;Proper use of Adaptive equipment and DME      AM-PAC PT "6 Clicks" Mobility    Outcome Measure  Help needed turning from your back to your side while in a flat bed without using bedrails?: A Little Help needed moving from lying on your back to sitting on the side of a flat bed without using bedrails?: A Lot Help needed moving to and from a bed to a chair (including a wheelchair)?: A Lot Help needed standing up from a chair using your arms (e.g., wheelchair or bedside chair)?: A Little Help needed to walk in hospital room?: A Lot Help needed climbing 3-5 steps with a railing? : A Lot 6 Click Score: 14    End of Session Equipment Utilized During Treatment: Gait belt Activity Tolerance: Treatment limited secondary to medical complications (Comment) (symptomatic orthostatic hypotension) Patient left: in bed;with call bell/phone within reach;with bed alarm set Nurse Communication: Mobility status;Other (comment) (orthostatic) PT Visit Diagnosis: Other abnormalities of gait and mobility (R26.89);Other symptoms and signs involving the nervous system (H73.428)     Time: 7681-1572 PT Time Calculation (min) (ACUTE ONLY): 25 min  Charges:  $Therapeutic Activity: 8-22 mins                     Latoya Cox, PT  Office # [phone] Pager [phone]    Latoya Cox 11/02/2019, 10:39 AM

## 2019-11-02 NOTE — TOC Progression Note (Signed)
Transition of Care [facility]) - Progression Note    Patient Details  Name: Latoya Cox MRN: [id number] Date of Birth: 05/03/1965  [facility]) CM/SW Contact  Mearl Latin, LCSW Phone Number: 11/02/2019, 1:12 PM  Clinical Narrative:    CSW continuing to follow. Patient does not have any bed offers due to lack of insurance benefits at this time. CSW is searching for an LOG bed.    Expected Discharge Plan: Skilled Nursing Facility Barriers to Discharge: Inadequate or no insurance, SNF Pending bed offer  Expected Discharge Plan and Services Expected Discharge Plan: Skilled Nursing Facility In-house Referral: Clinical Social Work   Post Acute Care Choice: Skilled Nursing Facility Living arrangements for the past 2 months: Single Family Home                                       Social Determinants of Health (SDOH) Interventions    Readmission Risk Interventions Readmission Risk Prevention Plan 10/29/2019  Transportation Screening Complete  Home Care Screening Complete  Some recent data might be hidden

## 2019-11-02 NOTE — Progress Notes (Signed)
  Speech Language Pathology Treatment: Dysphagia;Cognitive-Linquistic  Patient Details Name: Latoya Cox MRN: [id number] DOB: 12/20/1965 Today's Date: 11/02/2019 Time: 5361-4431 SLP Time Calculation (min) (ACUTE ONLY): 13 min  Assessment / Plan / Recommendation Clinical Impression  Pt is drowsy but more alert than in most recent sessions. She responds mostly with short utterances that are off topic. SLP provided Max cues to try to increase appropriate responses and accuracy. She needs Mod cues for initiation of self-feeding and has sluggish oral preparation of advanced solid textures. Her vocal quality is clear and suspect back to her baseline, so advanced trials of thin liquids were also administered. No overt s/s of aspiration were noted, but she also had silent aspiration on MBS. Attempted to have pt drink three ounces of water consecutively to challenge her system but she repeatedly stopped drinking despite heavy cueing from SLP. Given that she also remains drowsy, will hold on diet upgrade today with additional trials provided by SLP. Pt may end up benefiting from repeat MBS prior to discharge to SNF.    HPI HPI: Pt with acute headache and slurred speech, brought by EMS to hospital. CT head was obtained, revealing an ICH originating from the left basal ganglila, extending into the left lateral, third and 4th ventricles, with a small amount of blood in the right lateral ventricle as well. Early stages of hydrocephalus were also noted on CT. Acute hypoxia from respiratory failure, requiring intubation on 10/16/19. Extubated 10/21/19.      SLP Plan  Continue with current plan of care       Recommendations  Diet recommendations: Dysphagia 2 (fine chop);Nectar-thick liquid Liquids provided via: Cup;Straw Medication Administration: Crushed with puree Supervision: Staff to assist with self feeding;Full supervision/cueing for compensatory strategies Compensations: Minimize environmental  distractions Postural Changes and/or Swallow Maneuvers: Seated upright 90 degrees                Oral Care Recommendations: Oral care BID Follow up Recommendations: Skilled Nursing facility SLP Visit Diagnosis: Dysphagia, unspecified (R13.10);Cognitive communication deficit (R41.841) Plan: Continue with current plan of care       GO                Mahala Menghini., M.A. CCC-SLP Acute Rehabilitation Services Pager [phone] Office [phone]  11/02/2019, 3:54 PM

## 2019-11-02 NOTE — Progress Notes (Signed)
Inpatient Rehabilitation-Admissions Coordinator   Spoke with pt's husband to clarify insurance barriers. Pt's husband continues to work on getting hold off the insurance account. I did confirm that her current insurance policy does not have IP Rehab benefits even once active. The patient's husband has clarified that they do not want to do self payment for CIR due to financial burden. AC will notify TOC team and will sign off.   Cheri Rous, OTR/L  Rehab Admissions Coordinator  [phone] 11/02/2019 9:36 AM

## 2019-11-02 NOTE — Progress Notes (Signed)
Occupational Therapy Treatment Patient Details Name: Latoya Cox MRN: [id number] DOB: 1965/10/10 Today's Date: 11/02/2019    History of present illness 54 y.o. female with DM, HTN and arthritis, [name] and depressed level of consciousness with garbled speech at home, after complaining of a headache earlier in the day. STAT CT head was obtained, revealing an ICH originating from the left basal ganglila, extending into the left lateral, third and 4th ventricles, with a small amount of blood in the right lateral ventricle as well. Early stages of hydrocephalus were also noted on CT. Pt underwent L frontal IVC placement and intubation on 6/18. Pt extubated on 6/23.   OT comments  Upon arrival, pt sleeping in bed - but agreeable to therapy. Pt very lethargic needing max multimodal cues to keep eyes open and participate in session. Required min A with supine<>sit EOB. Pt ambulated to sink with Min A +2 for safety needed for transfers and ambulation with RW. At sink, pt initiated activity at sink but became increasingly lethargic needing max verbal cues and min A to stand upright and keep eyes open. Pt symptomatic with orthostatic hypotension and sat down then positioned in bed with Mod A for sit<>supine and nurse notified. Vitals were monitored during session - initial BP 106/72 supine, BP in sitting (after amb to sink) 75/61, BP after return to flat supine 91/63, BP with HOB at 25 degrees 89/61.  Pt/husband declining CIR due to payer source, not covered by insurance policy so would be private pay. Discharge recommendation updated to SNF.   Follow Up Recommendations  SNF    Equipment Recommendations  None recommended by OT (TBD at next venue of care)       Precautions / Restrictions Precautions Precautions: Fall Restrictions Weight Bearing Restrictions: No       Mobility Bed Mobility Overal bed mobility: Needs Assistance Bed  Mobility: Supine to Sit;Sit to Supine     Supine to sit: Min assist;HOB elevated Sit to supine: Mod assist   General bed mobility comments: Min A to EOB and Mod A to supine assist with LB extremities due to symptomatic orthostatic BP   Transfers Overall transfer level: Needs assistance Equipment used: Rolling walker (2 wheeled) Transfers: Sit to/from [employer] Sit to Stand: Min assist;+2 safety/equipment;Min guard Stand pivot transfers: Min assist;+2 safety/equipment       General transfer comment: cues for hand placement on RW and sequencing    Balance Overall balance assessment: Needs assistance Sitting-balance support: No upper extremity supported;Feet supported Sitting balance-Leahy Scale: Fair     Standing balance support: Bilateral upper extremity supported;During functional activity Standing balance-Leahy Scale: Poor Standing balance comment: reliant on external support                           ADL either performed or assessed with clinical judgement   ADL Overall ADL's : Needs assistance/impaired     Grooming: Wash/dry hands;Minimal assistance;Standing;Cueing for sequencing;Cueing for safety Grooming Details (indicate cue type and reason): Washing hands at sink with rolling walker min A to right posture with lethargy. Max multimodal cue to initiate and sequence                 Toilet Transfer: Minimal assistance;+2 for safety/equipment;Stand-pivot;RW;Cueing for sequencing Toilet Transfer Details (indicate cue type and reason): simulated to recliner using RW. Min A +2 for safety and RW management. Needed max verbal  cues to open eyes and for sequencing         Functional mobility during ADLs: Minimal assistance;+2 for safety/equipment;Rolling walker General ADL Comments: Pt needing max verbal cues for sequencing and safety opening eyes to not fall asleep and ambulate with walker               Cognition Arousal/Alertness:  Lethargic Behavior During Therapy: Flat affect;WFL for tasks assessed/performed Overall Cognitive Status: Impaired/Different from baseline Area of Impairment: Attention;Memory;Following commands;Safety/judgement;Awareness;Problem solving                 Orientation Level: Disoriented to;Time;Situation Current Attention Level: Sustained Memory: Decreased recall of precautions;Decreased short-term memory Following Commands: Follows one step commands inconsistently;Follows one step commands with increased time Safety/Judgement: Decreased awareness of safety;Decreased awareness of deficits Awareness: Intellectual Problem Solving: Slow processing;Decreased initiation;Difficulty sequencing;Requires verbal cues;Requires tactile cues General Comments: Pt not able to recall event from Friday. Slow processing needing multiple multimodal commands to initiate grooming at sink. Repeatedly asking "how long have I been like this"              General Comments initial BP 106/72 supine, BP in sitting (after amb to sink) 75/61, BP after return to flat supine 91/63, BP with HOB at 25 degrees 89/61    Pertinent Vitals/ Pain       Pain Assessment: Faces Faces Pain Scale: Hurts a little bit Pain Location: generalized Pain Descriptors / Indicators: Grimacing Pain Intervention(s): Monitored during session;Repositioned         Frequency  Min 2X/week        Progress Toward Goals  OT Goals(current goals can now be found in the care plan section)  Progress towards OT goals: Progressing toward goals  Acute Rehab OT Goals Patient Stated Goal: To improve mobility Time For Goal Achievement: 11/02/19 Potential to Achieve Goals: Good  Plan Discharge plan remains appropriate;Frequency remains appropriate    Co-evaluation    PT/OT/SLP Co-Evaluation/Treatment: Yes Reason for Co-Treatment: Complexity of the patient's impairments (multi-system involvement);For patient/therapist safety;To address  functional/ADL transfers;Necessary to address cognition/behavior during functional activity PT goals addressed during session: Mobility/safety with mobility;Balance;Proper use of DME OT goals addressed during session: ADL's and self-care;Proper use of Adaptive equipment and DME      AM-PAC OT "6 Clicks" Daily Activity     Outcome Measure   Help from another person eating meals?: Total Help from another person taking care of personal grooming?: A Little Help from another person toileting, which includes using toliet, bedpan, or urinal?: A Little Help from another person bathing (including washing, rinsing, drying)?: A Little Help from another person to put on and taking off regular upper body clothing?: A Lot Help from another person to put on and taking off regular lower body clothing?: A Lot 6 Click Score: 14    End of Session Equipment Utilized During Treatment: Rolling walker;Gait belt  OT Visit Diagnosis: Other abnormalities of gait and mobility (R26.89);Hemiplegia and hemiparesis;Other symptoms and signs involving the nervous system (R29.898) Hemiplegia - Right/Left: Right Hemiplegia - dominant/non-dominant: Dominant Hemiplegia - caused by: Nontraumatic intracerebral hemorrhage   Activity Tolerance Patient limited by fatigue;Treatment limited secondary to medical complications (Comment) (Orthostatic)   Patient Left in bed;with call bell/phone within reach;with bed alarm set   Nurse Communication Mobility status;Other (comment) (Symptomatic orthostatic BP)        Time: 0258-5277 OT Time Calculation (min): 24 min  Charges: OT General Charges $OT Visit: 1 Visit OT Treatments $Self Care/Home Management : 8-22 mins  Teresia Myint/OTS  Victorious Cosio 11/02/2019, 11:22 AM

## 2019-11-03 DIAGNOSIS — I951 Orthostatic hypotension: Secondary | ICD-10-CM | POA: Diagnosis not present

## 2019-11-03 DIAGNOSIS — I610 Nontraumatic intracerebral hemorrhage in hemisphere, subcortical: Secondary | ICD-10-CM | POA: Diagnosis not present

## 2019-11-03 DIAGNOSIS — J9601 Acute respiratory failure with hypoxia: Secondary | ICD-10-CM | POA: Diagnosis not present

## 2019-11-03 DIAGNOSIS — R4182 Altered mental status, unspecified: Secondary | ICD-10-CM | POA: Diagnosis not present

## 2019-11-03 LAB — GLUCOSE, CAPILLARY
Glucose-Capillary: 110 mg/dL — ABNORMAL HIGH (ref 70–99)
Glucose-Capillary: 188 mg/dL — ABNORMAL HIGH (ref 70–99)
Glucose-Capillary: 250 mg/dL — ABNORMAL HIGH (ref 70–99)
Glucose-Capillary: 151 mg/dL — ABNORMAL HIGH (ref 70–99)

## 2019-11-03 LAB — POTASSIUM: Potassium: 3.7 mmol/L (ref 3.5–5.1)

## 2019-11-03 NOTE — Progress Notes (Addendum)
PROGRESS NOTE    Latoya Cox  ZOX:[id number]  DOB: 07-22-1965  PCP: Myrlene Broker, MD Admit date:10/15/2019 Chief compliant: Headache, AMS 54 y.o.femalewith DM, HTN and arthritis,[name] and depressed level of consciousness with garbled speech at home, after complaining of a headacheearlier in the day. On EMS arrival, they noted right facial droop. She vomited en route and continued to vomit intermittently on arrival to the ED.  ED Course: She was obtunded to somnolent and unable to answer questions intelligibly. STAT CT head was obtained, revealing an ICH originating from the left basal ganglila, extending into the left lateral, third and 4th ventricles, with a small amount of blood in the right lateral ventricle as well. Early stages of hydrocephalus were also noted on CT Hospital course: Patient admitted to ICU by Neuro.Seen by NSU-EVD placed. Remained in ICU on vent support , subsequently weaned and transferred out to [facility] on Hosp day 9 ( 6/27) with concerns for Hypernatremia 154, AKI 42/1.1 and hyperkalemia - resolved and now hypokalemia at 3.2  Subjective:  Patient more awake and alert this morning in rounds.  Denies any headache.  BP improved.  Objective: Vitals:   11/03/19 0321 11/03/19 0729 11/03/19 0800 11/03/19 1133  BP: (!) 143/92 130/89  108/75  Pulse: 93 92  85  Resp: 14 16  18   Temp: 98.7 F (37.1 C) 98.9 F (37.2 C)  98 F (36.7 C)  TempSrc: Oral Oral  Oral  SpO2: 100% 99% 100% 100%  Weight:      Height:        Intake/Output Summary (Last 24 hours) at 11/03/2019 1413 Last data filed at 11/03/2019 1300 Gross per 24 hour  Intake 800 ml  Output 452 ml  Net 348 ml   Filed Weights   10/30/19 0500 11/01/19 0500 11/02/19 0500  Weight: 80.8 kg 78.4 kg 78.4 kg    Physical Examination:  General: Appears more awake alert and communicative today, no acute distress otherwise. Head ENT:  Atraumatic normocephalic, PERRLA, neck supple Heart: S1-S2 heard, regular rate and rhythm, no murmurs.  No leg edema noted Lungs: Equal air entry bilaterally, no rhonchi or rales on exam, no accessory muscle use Abdomen: Bowel sounds heard, soft, nontender, nondistended. No organomegaly.  No CVA tenderness Extremities: No pedal edema.  No cyanosis or clubbing. Neurological: Awake alert oriented x3.  Decreased strength in lower extremities right greater than left. Skin: No wounds or rashes.   Data Reviewed: I have personally reviewed following labs and imaging studies  CBC: Recent Labs  Lab 10/28/19 0442 10/29/19 0316 10/31/19 0438 11/02/19 0631  WBC 15.3* 13.0* 11.6* 10.7*  HGB 12.3 11.4* 12.0 11.4*  HCT 39.0 36.5 38.1 36.4  MCV 84.1 84.3 85.6 85.0  PLT 250 217 275 266   Basic Metabolic Panel: Recent Labs  Lab 10/28/19 0442 10/29/19 0316 10/30/19 0350 10/31/19 0438 11/02/19 0631  NA 139 139  --  138 140  K 2.9* 2.8*  --  3.2* 3.2*  CL 106 105  --  103 104  CO2 24 25  --  26 26  GLUCOSE 151* 185*  --  214* 167*  BUN 10 14  --  14 15  CREATININE 0.76 0.86  --  0.88 0.91  CALCIUM 8.2* 8.3*  --  8.5* 8.5*  MG 1.8 1.9 1.9 2.0  --    GFR: Estimated Creatinine Clearance: 70.1 mL/min (by C-G formula based on SCr of  0.91 mg/dL). Liver Function Tests: No results for input(s): AST, ALT, ALKPHOS, BILITOT, PROT, ALBUMIN in the last 168 hours. No results for input(s): LIPASE, AMYLASE in the last 168 hours. No results for input(s): AMMONIA in the last 168 hours. Coagulation Profile: No results for input(s): INR, PROTIME in the last 168 hours. Cardiac Enzymes: No results for input(s): CKTOTAL, CKMB, CKMBINDEX, TROPONINI in the last 168 hours. BNP (last 3 results) No results for input(s): PROBNP in the last 8760 hours. HbA1C: No results for input(s): HGBA1C in the last 72 hours. CBG: Recent Labs  Lab 11/02/19 1122 11/02/19 1553 11/02/19 2105 11/03/19 0610 11/03/19 1147    GLUCAP 256* 272* 112* 110* 188*   Lipid Profile: No results for input(s): CHOL, HDL, LDLCALC, TRIG, CHOLHDL, LDLDIRECT in the last 72 hours. Thyroid Function Tests: No results for input(s): TSH, T4TOTAL, FREET4, T3FREE, THYROIDAB in the last 72 hours. Anemia Panel: No results for input(s): VITAMINB12, FOLATE, FERRITIN, TIBC, IRON, RETICCTPCT in the last 72 hours. Sepsis Labs: No results for input(s): PROCALCITON, LATICACIDVEN in the last 168 hours.  No results found for this or any previous visit (from the past 240 hour(s)).    Radiology Studies: CT HEAD WO CONTRAST  Result Date: 11/02/2019 CLINICAL DATA:  History of intracranial hemorrhage. EXAM: CT HEAD WITHOUT CONTRAST TECHNIQUE: Contiguous axial images were obtained from the base of the skull through the vertex without intravenous contrast. COMPARISON:  CT head dated 10/22/2019 FINDINGS: Brain: Intraparenchymal hemorrhage centered in the left medial basal ganglia and thalamus has continued to decrease in size and density. No new hemorrhage is identified. There is no hydrocephalus. The basilar cisterns are patent and there is no significant midline shift. Mild hypoattenuation is seen along the tract of the prior left ventricular catheter. Subdural hygromas along the tentorium are unchanged. Vascular: An aneurysm clip is redemonstrated. Skull: A left frontal burr hole and prior left craniotomy are redemonstrated. Sinuses/Orbits: No acute finding. Other: None. IMPRESSION: Continued to decrease in size and density of the left basal ganglia and thalamic hemorrhage. No new hemorrhage or hydrocephalus. Electronically Signed   By: Romona Curls M.D.   On: 11/02/2019 20:21      Scheduled Meds: . amantadine  100 mg Oral BID  . amLODipine  10 mg Oral Daily  . atorvastatin  40 mg Oral Daily  . carvedilol  25 mg Oral BID WC  . chlorhexidine  15 mL Mouth Rinse BID  . docusate  100 mg Oral BID  . enoxaparin (LOVENOX) injection  40 mg Subcutaneous  Q24H  . hydrALAZINE  25 mg Oral TID  . insulin aspart  0-9 Units Subcutaneous TID WC  . insulin aspart  4 Units Subcutaneous TID WC  . insulin detemir  13 Units Subcutaneous BID  . mouth rinse  15 mL Mouth Rinse q12n4p  . pantoprazole  40 mg Oral QHS  . polyethylene glycol  17 g Oral Daily  . senna-docusate  1 tablet Oral BID   Continuous Infusions:       Assessment/Plan:  1.  Left thalamic intracranial, intraventricular hemorrhage: Likely related to hypertension.  CT head on admission showed acute ICH in the left thalamus (9.6 cc) with intraventricular penetration and associated early dilatation of the lateral ventricles.  MRI head was not performed due to history of previous aneurysm clips.  CTA of the head on 6/19 did not reveal any recurrent aneurysm.  S/p EVD while in ICU with improvement.  CT head on June 24 showed interval improvement. Patient  was not on any antiplatelet or antithrombotic agents prior to admission.  Now awaiting SNF placement and has improved cognitively. Repeat CT head was obtained yesterday given hypersomnolence, lethargic-CT findings suggest improvement.  She was also noted to be hypotensive with orthostatic worsening per PT note yesterday (106/72-->75/60). Blood pressure medications titrated down as below.  She looks much improved today.  Cannot afford CIR, now looking for SNF placement as still requiring two-person assist per PT eval.  2.  Obstructive hydrocephalus: Present in initial hospital course as result of above.  Seen by neurosurgery and underwent EVD.  CT head on June 24 as well as 7/5 showed interval improvement of thalamic hemorrhage and resolution of IVH.  There was no evidence of hydrocephalus. EVD self removed 6/24, no acute issues and remained stable.   3.  Acute hypoxic respiratory failure: In the setting of problem #1.  Patient was intubated, self extubated on 6/24.  Currently saturating well on room air.  4. Hypernatremia, hypokalemia:  Sodium  was 150 initially, now improved to 138.  BMP in a.m. Replace potassium and repeat level  5. Hypertension-patient on Norvasc, Coreg and hydralazine at home.  Treated with cleviprex initially for uncontrolled blood pressure while in ICU for hypertensive emergency possibly causing problem #1. Patient was noted to be severely orthostatic with soft supine BP with multiple antihypertensives on board including Norvasc, Coreg, hydralazine, clonidine.  Currently on Norvasc, Coreg, reduced hydralazine yesterday to home dose with holding parameters and discontinued clonidine.  Will DC Norvasc as well given orthostatic change.  Monitor BP with orthostatic check in a.m. and titrate further as needed.  6.  Dysphagia: In the setting of problem #1 and prolonged intubation..  Continue dysphagia 2 diet with nectar thick liquids.  Patient was somnolent on speech therapy follow-up today as well.  They recommend MBS prior to discharge to SNF.  7. Leukocytosis: Likely reactive, could be related to atelectasis versus aspiration..  Afebrile.  UA negative on 6/26.  Chest x-ray on 6/26 also showed bibasilar atelectasis more subsegmental atelectasis in the right lung base.  Continue aspiration precautions and dysphagia diet.  8. Diabetes mellitus type II, uncontrolled:Hemoglobin A1c 7.7, resume outpatient meds at discharge.Continue Levemir 13 units twice daily, NovoLog 4 units 3 times daily AC, sliding scale insulin  9.  PSVT: Resolved and stable on Coreg. Initially hyperkalemic, now hypokalemic with potassium 3.2. Replace.  10.Hyperlipidemia:LDL 186, goal <70, Continue statin  DVT prophylaxis: SCDs given ICH Code Status: Full code Family / Patient Communication: Discussed with patient, called and updated husband Disposition Plan:   Status is: Inpatient  Remains inpatient appropriate because:Unsafe d/c plan, hemodynamic instability with significant orthostatic hypotension.   Dispo: The patient is from: Home               Anticipated d/c is to: SNF              Anticipated d/c date is: 2 days              Patient currently is medically stable to d/c.   Time spent: 25 minutes     >50% time spent in discussions with care team and coordination of care.    Alessandra Bevels, MD [facility] Pager in [place]  If 7PM-7AM, please contact night-coverage [email or web address] 11/02/2019, 9:16 AM

## 2019-11-04 DIAGNOSIS — J9601 Acute respiratory failure with hypoxia: Secondary | ICD-10-CM | POA: Diagnosis not present

## 2019-11-04 DIAGNOSIS — R4182 Altered mental status, unspecified: Secondary | ICD-10-CM | POA: Diagnosis not present

## 2019-11-04 DIAGNOSIS — E876 Hypokalemia: Secondary | ICD-10-CM | POA: Diagnosis not present

## 2019-11-04 DIAGNOSIS — I610 Nontraumatic intracerebral hemorrhage in hemisphere, subcortical: Secondary | ICD-10-CM | POA: Diagnosis not present

## 2019-11-04 LAB — BASIC METABOLIC PANEL
Anion gap: 11 (ref 5–15)
BUN: 7 mg/dL (ref 6–20)
CO2: 26 mmol/L (ref 22–32)
Calcium: 8.9 mg/dL (ref 8.9–10.3)
Chloride: 102 mmol/L (ref 98–111)
Creatinine, Ser: 0.75 mg/dL (ref 0.44–1.00)
GFR calc Af Amer: >60 mL/min (ref >60)
GFR calc non Af Amer: >60 mL/min (ref >60)
Glucose, Bld: 115 mg/dL — ABNORMAL HIGH (ref 70–99)
Potassium: 3.2 mmol/L — ABNORMAL LOW (ref 3.5–5.1)
Sodium: 139 mmol/L (ref 135–145)

## 2019-11-04 LAB — GLUCOSE, CAPILLARY
Glucose-Capillary: 112 mg/dL — ABNORMAL HIGH (ref 70–99)
Glucose-Capillary: 128 mg/dL — ABNORMAL HIGH (ref 70–99)
Glucose-Capillary: 59 mg/dL — ABNORMAL LOW (ref 70–99)
Glucose-Capillary: 99 mg/dL (ref 70–99)
Glucose-Capillary: 297 mg/dL — ABNORMAL HIGH (ref 70–99)

## 2019-11-04 MED ORDER — POTASSIUM CHLORIDE 20 MEQ/15ML (10%) PO SOLN
40.0000 meq | Freq: Once | ORAL | Status: AC
  Administered 2019-11-04: 40 meq via ORAL
  Filled 2019-11-04: qty 30

## 2019-11-04 NOTE — Progress Notes (Signed)
Nutrition Follow-up  DOCUMENTATION CODES:   Obesity unspecified  INTERVENTION:  Magic cup TID with meals, each supplement provides 290 kcal and 9 grams of protein  Continue Vital Cuisine shake po BID, each supplement provides 500 kcal and 22 grams of protein  Recommend consult to diabetes coordinator   NUTRITION DIAGNOSIS:   Inadequate oral intake related to dysphagia as evidenced by NPO status.  Progressing, pt now on Dysphagia 2 diet with Nectar thick liquids  GOAL:   Patient will meet greater than or equal to 90% of their needs  Progressing.   MONITOR:   Diet advancement, Labs, Weight trends, Skin, I & O's  REASON FOR ASSESSMENT:   Ventilator, Consult Enteral/tube feeding initiation and management  ASSESSMENT:   54 year old female who presented on 6/17 as a code stroke. PMH of DM and poorly controlled HTN. CT revealed ICH.  6/18 - worsening hydrocephalus, s/p EVD, intubated 6/23 - extubated 6/24 - EVD self removed 6/25 - s/p MBS, Dysphagia 2 with nectar thick liquids  CT was repeated given pt's hypersomnolence and lethargy; CT suggested improvement. Pt unable to go to CIR due to cost and is now pending SNF placement.   Pt's appetite continues to be varied, though slightly improved. Will continue with oral nutrition supplements to aid in calorie/protein intake.   PO Intake: 25-75% x 8 recorded meals (43% average meal intake)  Labs: K+ 3.2 (L), CBGs 112-250 Medications: colace, Novolog, Levemir, Protonix, Miralax, Senokot-S  Diet Order:   Diet Order            DIET DYS 2 Room service appropriate? Yes; Fluid consistency: Nectar Thick  Diet effective now                 EDUCATION NEEDS:   No education needs have been identified at this time  Skin:  Skin Assessment: Skin Integrity Issues: Skin Integrity Issues:: Incisions, Stage II Stage II: buttocks Incisions: L head  Last BM:  7/7 type 6  Height:   Ht Readings from Last 1 Encounters:   10/25/19 5\' 2"  (1.575 m)    Weight:   Wt Readings from Last 1 Encounters:  11/04/19 74.6 kg    Ideal Body Weight:  50 kg  BMI:  Body mass index is 30.08 kg/m.  Estimated Nutritional Needs:   Kcal:  1800-2000  Protein:  90-110 grams  Fluid:  >/= 1.8 L    01/05/20, MS, RD, LDN RD pager number and weekend/on-call pager number located in [place].

## 2019-11-04 NOTE — Progress Notes (Signed)
SLP Cancellation Note  Patient Details Name: Latoya Cox MRN: [id number] DOB: [birth year]   Cancelled treatment:       Reason Eval/Treat Not Completed: Other (comment) Pt receiving nursing care. Will f/u as able.   Mahala Menghini., M.A. CCC-SLP Acute Rehabilitation Services Pager [phone] Office [phone]  11/04/2019, 11:33 AM

## 2019-11-04 NOTE — TOC Progression Note (Signed)
Transition of Care [facility]) - Progression Note    Patient Details  Name: Latoya Cox MRN: [id number] Date of Birth: 12-17-1965  [facility] CM/SW Contact  Levada Schilling Phone Number: 11/04/2019, 9:51 AM  Clinical Narrative:    CSW is searching for LOG bed for pt due to lack of insurance benefits. Pt has only prevention care benefits for insurance.   Expected Discharge Plan: Skilled Nursing Facility Barriers to Discharge: Inadequate or no insurance, SNF Pending bed offer  Expected Discharge Plan and Services Expected Discharge Plan: Skilled Nursing Facility In-house Referral: Clinical Social Work   Post Acute Care Choice: Skilled Nursing Facility Living arrangements for the past 2 months: Single Family Home                                       Social Determinants of Health (SDOH) Interventions    Readmission Risk Interventions Readmission Risk Prevention Plan 10/29/2019  Transportation Screening Complete  Home Care Screening Complete  Some recent data might be hidden

## 2019-11-04 NOTE — Progress Notes (Signed)
PROGRESS NOTE    Latoya Cox  GNO:[id number] DOB: 1965-10-07 DOA: 10/15/2019 PCP: Myrlene Broker, MD   Brief Narrative:  54 y.o.femalewith DM, HTN and arthritis,[name] and depressed level of consciousness with garbled speech at home, after complaining of a headacheearlier in the day. On EMS arrival, they notedright facial droop.She vomited en route and continued to vomit intermittently on arrival to the ED. STAT CT head was obtained, revealing an ICH originating from the left basal ganglila, extending into the left lateral, third and 4th ventricles, with a small amount of blood in the right lateral ventricle as well. Early stages of hydrocephalus were also noted on CT. Patient admitted to ICU by Neuro.Seen by NSU-EVD placed. Remained in ICU on vent support , subsequently weaned and transferred out to [facility] on Hosp day 9 ( 6/27) with concerns for Hypernatremia 154, AKI 42/1.1 and hyperkalemia - resolved.  PT recommended SNF placement, currently awaiting placement.    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  MRI head was not performed due to history of previous aneurysm clips.   CTA of the head on 6/19 did not reveal any recurrent aneurysm.  S/p EVD while in ICU with improvement.  CT head on June 24 showed interval improvement. Repeat CT head due to lethargy since 11/6-negative for any acute pathology.  Now resolved. PT/OT recommended SNF.  Currently awaiting placement.  Obstructive hydrocephalus:  Seen by neurosurgery and underwent EVD.  CT head on June 24  as well as 7/5 showed interval improvement of thalamic hemorrhage and resolution of IVH.  There was no evidence of hydrocephalus. EVD self removed 6/24, no acute issues and remained stable.   Acute hypoxic respiratory failure: Resolved  Hypernatremia, hypokalemia:   Resolved  Essential Hypertension- Currently  Coreg, hydralazine.  Clonidine discontinued.   Dysphagia: Currently on dysphagia 2 diet, speech and swallow therapy following.  Diabetes mellitus type II, uncontrolled due to hyperglycemia Hemoglobin A1c 7.7, resume outpatient meds at discharge. Continue Levemir 13 units twice daily, NovoLog 4 units 3 times daily AC, sliding scale insulin  PSVT Replete electrolytes as appropriate.  Continue Coreg  Hyperlipidemia: LDL 186, goal <70, Continue statin-Lipitor 40 mg daily     DVT prophylaxis: enoxaparin (LOVENOX) injection 40 mg Start: 10/19/19 0900 Place and maintain sequential compression device Start: 10/16/19 1205  Family Communication:  None at bedside  Status is: Inpatient  Remains inpatient appropriate because:Unsafe d/c plan   Dispo: The patient is from: SNF              Anticipated d/c is to: SNF              Anticipated d/c date is: 1 day              Patient currently is medically stable to d/c.  Currently awaiting SNF bed PT/OT evaluation performed.  SNF recommended.  SNF appropriate as the patient has received 3 days of hospital care (or the 3 days stay Has been made by the patient's insurance company) and is felt to need rehab services to restore this patient to  their prior level of function to achieve safe transition back to home care.  This patient needs rehab services for at least 5 days/week and skilled nursing services daily to facilitate this transition.  Rehab is been requested as the most appropriate discharge option for this patient and is not felt to be custodial care.  Body mass index is 30.08 kg/m.    Subjective: No complaints this  morning, responding appropriately.  Awaiting placement  Review of Systems Otherwise negative except as per HPI, including: General: Denies fever, chills, night sweats or unintended weight loss. Resp: Denies cough, wheezing, shortness of breath. Cardiac: Denies chest pain, palpitations, orthopnea, paroxysmal nocturnal dyspnea. GI: Denies abdominal pain, nausea, vomiting, diarrhea or constipation GU: Denies dysuria, frequency, hesitancy or incontinence MS: Denies muscle aches, joint pain or swelling Neuro: Denies headache, neurologic deficits (focal weakness, numbness, tingling), abnormal gait Psych: Denies anxiety, depression, SI/HI/AVH Skin: Denies new rashes or lesions ID: Denies sick contacts, exotic exposures, travel  Examination:  General exam: Appears calm and comfortable, appears chronically ill Respiratory system: Clear to auscultation. Respiratory effort normal. Cardiovascular system: S1 & S2 heard, RRR. No JVD, murmurs, rubs, gallops or clicks. No pedal edema. Gastrointestinal system: Abdomen is nondistended, soft and nontender. No organomegaly or masses felt. Normal bowel sounds heard. Central nervous system: Alert and oriented. No focal neurological deficits. Extremities: Generalized weakness. Skin: No rashes, lesions or ulcers Psychiatry: Judgement and insight appear normal. Mood & affect appropriate.     Objective: Vitals:   11/03/19 1940 11/03/19 2305 11/04/19 0458 11/04/19 0800  BP: (!) 144/85 (!) 148/84 (!) 144/87 (!) 147/94  Pulse: 81 89 90 88  Resp: 20 20 16    Temp: 98.9 F (37.2 C) 98.6 F (37 C) 98.5 F (36.9 C) 98.5 F (36.9 C)  TempSrc: Oral Oral Oral Oral  SpO2: 100% 100% 98% 100%  Weight:   74.6 kg   Height:        Intake/Output Summary (Last 24 hours) at 11/04/2019 1132 Last data filed at 11/04/2019 0900 Gross per 24 hour  Intake 360 ml  Output 7 ml  Net 353 ml   Filed Weights   11/01/19 0500 11/02/19 0500 11/04/19 0458  Weight: 78.4 kg 78.4  kg 74.6 kg     Data Reviewed:   CBC: Recent Labs  Lab 10/29/19 0316 10/31/19 0438 11/02/19 0631  WBC 13.0* 11.6* 10.7*  HGB 11.4* 12.0 11.4*  HCT 36.5 38.1 36.4  MCV 84.3 85.6 85.0  PLT 217 275 266   Basic Metabolic Panel: Recent Labs  Lab 10/29/19 0316 10/30/19 0350 10/31/19 0438 11/02/19 0631 11/03/19 1439 11/04/19 0457  NA 139  --  138 140  --  139  K 2.8*  --  3.2* 3.2* 3.7 3.2*  CL 105  --  103 104  --  102  CO2 25  --  26 26  --  26  GLUCOSE 185*  --  214* 167*  --  115*  BUN 14  --  14 15  --  7  CREATININE 0.86  --  0.88 0.91  --  0.75  CALCIUM 8.3*  --  8.5* 8.5*  --  8.9  MG 1.9 1.9 2.0  --   --   --    GFR: Estimated Creatinine Clearance: 77.8 mL/min (by C-G formula based on SCr of 0.75 mg/dL). Liver Function Tests: No results for input(s): AST, ALT, ALKPHOS, BILITOT, PROT, ALBUMIN in the last 168 hours. No results for input(s): LIPASE, AMYLASE in the  last 168 hours. No results for input(s): AMMONIA in the last 168 hours. Coagulation Profile: No results for input(s): INR, PROTIME in the last 168 hours. Cardiac Enzymes: No results for input(s): CKTOTAL, CKMB, CKMBINDEX, TROPONINI in the last 168 hours. BNP (last 3 results) No results for input(s): PROBNP in the last 8760 hours. HbA1C: No results for input(s): HGBA1C in the last 72 hours. CBG: Recent Labs  Lab 11/03/19 0610 11/03/19 1147 11/03/19 1545 11/03/19 2115 11/04/19 0635  GLUCAP 110* 188* 250* 151* 112*   Lipid Profile: No results for input(s): CHOL, HDL, LDLCALC, TRIG, CHOLHDL, LDLDIRECT in the last 72 hours. Thyroid Function Tests: No results for input(s): TSH, T4TOTAL, FREET4, T3FREE, THYROIDAB in the last 72 hours. Anemia Panel: No results for input(s): VITAMINB12, FOLATE, FERRITIN, TIBC, IRON, RETICCTPCT in the last 72 hours. Sepsis Labs: No results for input(s): PROCALCITON, LATICACIDVEN in the last 168 hours.  No results found for this or any previous visit (from the past  240 hour(s)).       Radiology Studies: CT HEAD WO CONTRAST  Result Date: 11/02/2019 CLINICAL DATA:  History of intracranial hemorrhage. EXAM: CT HEAD WITHOUT CONTRAST TECHNIQUE: Contiguous axial images were obtained from the base of the skull through the vertex without intravenous contrast. COMPARISON:  CT head dated 10/22/2019 FINDINGS: Brain: Intraparenchymal hemorrhage centered in the left medial basal ganglia and thalamus has continued to decrease in size and density. No new hemorrhage is identified. There is no hydrocephalus. The basilar cisterns are patent and there is no significant midline shift. Mild hypoattenuation is seen along the tract of the prior left ventricular catheter. Subdural hygromas along the tentorium are unchanged. Vascular: An aneurysm clip is redemonstrated. Skull: A left frontal burr hole and prior left craniotomy are redemonstrated. Sinuses/Orbits: No acute finding. Other: None. IMPRESSION: Continued to decrease in size and density of the left basal ganglia and thalamic hemorrhage. No new hemorrhage or hydrocephalus. Electronically Signed   By: Romona Curls M.D.   On: 11/02/2019 20:21        Scheduled Meds: . amantadine  100 mg Oral BID  . atorvastatin  40 mg Oral Daily  . carvedilol  25 mg Oral BID WC  . chlorhexidine  15 mL Mouth Rinse BID  . docusate  100 mg Oral BID  . enoxaparin (LOVENOX) injection  40 mg Subcutaneous Q24H  . hydrALAZINE  25 mg Oral TID  . insulin aspart  0-9 Units Subcutaneous TID WC  . insulin aspart  4 Units Subcutaneous TID WC  . insulin detemir  13 Units Subcutaneous BID  . mouth rinse  15 mL Mouth Rinse q12n4p  . pantoprazole  40 mg Oral QHS  . polyethylene glycol  17 g Oral Daily  . senna-docusate  1 tablet Oral BID   Continuous Infusions:   LOS: 20 days   Time spent= 35 mins    Izeyah Deike Joline Maxcy, MD [facility]  If 7PM-7AM, please contact night-coverage  11/04/2019, 11:32 AM

## 2019-11-04 NOTE — Progress Notes (Signed)
Physical Therapy Treatment Patient Details Name: Latoya Cox MRN: [id number] DOB: 12-Apr-1966 Today's Date: 11/04/2019    History of Present Illness 54 y.o. female with DM, HTN and arthritis, [name] and depressed level of consciousness with garbled speech at home, after complaining of a headache earlier in the day. STAT CT head was obtained, revealing an ICH originating from the left basal ganglila, extending into the left lateral, third and 4th ventricles, with a small amount of blood in the right lateral ventricle as well. Early stages of hydrocephalus were also noted on CT. Pt underwent L frontal IVC placement and intubation on 6/18. Pt extubated on 6/23.    PT Comments    Continuing work on functional mobility and activity tolerance;  Intial goal of session was to gently incr activity while monitoring BP; Supine in bed, pt's BP was 146/115, so opted not to incr activity --rather, changed to plan to obtain Orthostatic BPs (See vitals flow sheet.); RN aware of HTN;  During session, Latoya Cox became tearful and emotional -- provided therapeutic use of self/presence for listening; Ended session in bed due to HTN  Follow Up Recommendations  SNF     Equipment Recommendations  Rolling walker with 5" wheels;3in1 (PT) (tBD at next venue of care)    Recommendations for Other Services       Precautions / Restrictions Precautions Precautions: Fall    Mobility  Bed Mobility Overal bed mobility: Needs Assistance Bed Mobility: Supine to Sit;Sit to Supine     Supine to sit: Min guard Sit to supine: Min guard   General bed mobility comments: Slow moving, not needing physical assist; cues to self-monitor for activity tolerance  Transfers Overall transfer level: Needs assistance Equipment used: Rolling walker (2 wheeled) Transfers: Sit to/from Stand Sit to Stand: Min assist         General transfer comment: cues for hand  placement on RW and sequencing  Ambulation/Gait Ambulation/Gait assistance: Min assist Gait Distance (Feet):  (sidesteps at EOB) Assistive device: Rolling walker (2 wheeled)       General Gait Details: Limited amb and physical activity due to high BP, diastolic, esp   Stairs             Wheelchair Mobility    Modified Rankin (Stroke Patients Only) Modified Rankin (Stroke Patients Only) Pre-Morbid Rankin Score: No symptoms Modified Rankin: Moderately severe disability     Balance Overall balance assessment: Needs assistance   Sitting balance-Leahy Scale: Fair       Standing balance-Leahy Scale: Poor Standing balance comment: reliant on external support                            Cognition Arousal/Alertness: Awake/alert Behavior During Therapy: WFL for tasks assessed/performed                                   General Comments: Tearful during session and repeating, "I didn't know"      Exercises      General Comments General comments (skin integrity, edema, etc.):   11/04/19 0943  Vital Signs  Patient Position (if appropriate) Orthostatic Vitals  Orthostatic Lying   BP- Lying (!) 146/115  Pulse- Lying 108  Orthostatic Sitting  BP- Sitting (!) 144/105  Pulse- Sitting 110  Orthostatic Standing at 0 minutes  BP- Standing at  0 minutes (!) 130/99  Pulse- Standing at 0 minutes 101         Pertinent Vitals/Pain Pain Assessment: No/denies pain Faces Pain Scale: No hurt Pain Intervention(s): Monitored during session    Home Living                      Prior Function            PT Goals (current goals can now be found in the care plan section) Acute Rehab PT Goals Patient Stated Goal: To improve mobility PT Goal Formulation: Patient unable to participate in goal setting Time For Goal Achievement: 11/14/19 Potential to Achieve Goals: Fair Progress towards PT goals: Progressing toward goals (slowly)     Frequency    Min 3X/week      PT Plan Current plan remains appropriate    Co-evaluation              AM-PAC PT "6 Clicks" Mobility   Outcome Measure  Help needed turning from your back to your side while in a flat bed without using bedrails?: A Little Help needed moving from lying on your back to sitting on the side of a flat bed without using bedrails?: A Little Help needed moving to and from a bed to a chair (including a wheelchair)?: A Lot Help needed standing up from a chair using your arms (e.g., wheelchair or bedside chair)?: A Little Help needed to walk in hospital room?: A Lot Help needed climbing 3-5 steps with a railing? : A Lot 6 Click Score: 15    End of Session Equipment Utilized During Treatment: Gait belt Activity Tolerance: Other (comment) (Treatment limited due to HTN) Patient left: in bed;with call bell/phone within reach;with bed alarm set Nurse Communication: Mobility status PT Visit Diagnosis: Other abnormalities of gait and mobility (R26.89);Other symptoms and signs involving the nervous system (R29.898)     Time: 0940-1001 PT Time Calculation (min) (ACUTE ONLY): 21 min  Charges:  $Therapeutic Activity: 8-22 mins                     Van Clines, PT  Acute Rehabilitation Services Pager [phone] Office [phone]    Levi Aland 11/04/2019, 3:08 PM

## 2019-11-05 ENCOUNTER — Other Ambulatory Visit: Payer: Self-pay | Admitting: Internal Medicine

## 2019-11-05 DIAGNOSIS — I610 Nontraumatic intracerebral hemorrhage in hemisphere, subcortical: Secondary | ICD-10-CM | POA: Diagnosis not present

## 2019-11-05 DIAGNOSIS — E1169 Type 2 diabetes mellitus with other specified complication: Secondary | ICD-10-CM

## 2019-11-05 DIAGNOSIS — E669 Obesity, unspecified: Secondary | ICD-10-CM

## 2019-11-05 DIAGNOSIS — R4182 Altered mental status, unspecified: Secondary | ICD-10-CM | POA: Diagnosis not present

## 2019-11-05 DIAGNOSIS — J9601 Acute respiratory failure with hypoxia: Secondary | ICD-10-CM | POA: Diagnosis not present

## 2019-11-05 LAB — BASIC METABOLIC PANEL
Anion gap: 11 (ref 5–15)
BUN: 7 mg/dL (ref 6–20)
CO2: 26 mmol/L (ref 22–32)
Calcium: 9 mg/dL (ref 8.9–10.3)
Chloride: 102 mmol/L (ref 98–111)
Creatinine, Ser: 0.78 mg/dL (ref 0.44–1.00)
GFR calc Af Amer: >60 mL/min (ref >60)
GFR calc non Af Amer: >60 mL/min (ref >60)
Glucose, Bld: 145 mg/dL — ABNORMAL HIGH (ref 70–99)
Potassium: 3.4 mmol/L — ABNORMAL LOW (ref 3.5–5.1)
Sodium: 139 mmol/L (ref 135–145)

## 2019-11-05 LAB — GLUCOSE, CAPILLARY
Glucose-Capillary: 140 mg/dL — ABNORMAL HIGH (ref 70–99)
Glucose-Capillary: 199 mg/dL — ABNORMAL HIGH (ref 70–99)
Glucose-Capillary: 146 mg/dL — ABNORMAL HIGH (ref 70–99)
Glucose-Capillary: 196 mg/dL — ABNORMAL HIGH (ref 70–99)

## 2019-11-05 LAB — MAGNESIUM: Magnesium: 1.9 mg/dL (ref 1.7–2.4)

## 2019-11-05 MED ORDER — POTASSIUM CHLORIDE CRYS ER 20 MEQ PO TBCR
40.0000 meq | EXTENDED_RELEASE_TABLET | Freq: Once | ORAL | Status: AC
  Administered 2019-11-05: 40 meq via ORAL
  Filled 2019-11-05: qty 2

## 2019-11-05 NOTE — Progress Notes (Signed)
PROGRESS NOTE    Latoya Cox  ZOX:[id number] DOB: Sep 22, 1965 DOA: 10/15/2019 PCP: Myrlene Broker, MD   Brief Narrative:  54 y.o.femalewith DM, HTN and arthritis,[name] and depressed level of consciousness with garbled speech at home, after complaining of a headacheearlier in the day. On EMS arrival, they notedright facial droop.She vomited en route and continued to vomit intermittently on arrival to the ED. STAT CT head was obtained, revealing an ICH originating from the left basal ganglila, extending into the left lateral, third and 4th ventricles, with a small amount of blood in the right lateral ventricle as well. Early stages of hydrocephalus were also noted on CT. Patient admitted to ICU by Neuro.Seen by NSU-EVD placed. Remained in ICU on vent support , subsequently weaned and transferred out to [facility] on Hosp day 9 ( 6/27) with concerns for Hypernatremia 154, AKI 42/1.1 and hyperkalemia - resolved.  PT recommended SNF placement, currently awaiting placement.    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  MRI head was not performed due to history of previous aneurysm clips.   CTA of the head on 6/19 did not reveal any recurrent aneurysm.  S/p EVD while in ICU with improvement.  CT head on June 24 showed interval improvement. Repeat CT head due to lethargy since 11/6-negative for any acute pathology.  Now resolved. PT/OT recommended SNF.  Awaiting placement  Obstructive hydrocephalus:   Seen by neurosurgery and underwent EVD.  CT head on June 24 as well as 7/5 showed interval improvement of thalamic hemorrhage and resolution of IVH.  There was no evidence of hydrocephalus. EVD self removed 6/24, no acute issues and remained stable.   Acute hypoxic respiratory failure: Resolved  Hypernatremia, hypokalemia:   Resolved  Essential Hypertension- Currently  Coreg, hydralazine.  Clonidine discontinued.   Dysphagia: Currently on dysphagia 2 diet, speech and swallow therapy following.  Diabetes mellitus type II, uncontrolled due to hyperglycemia Hemoglobin A1c 7.7, resume outpatient meds at discharge. Continue Levemir 13 units twice daily, NovoLog 4 units 3 times daily AC, sliding scale insulin  PSVT Replete electrolytes as appropriate.  Continue Coreg  Hyperlipidemia: LDL 186, goal <70, Continue statin-Lipitor 40 mg daily     DVT prophylaxis: enoxaparin (LOVENOX) injection 40 mg Start: 10/19/19 0900 Place and maintain sequential compression device Start: 10/16/19 1205  Family Communication:  None at bedside  Status is: Inpatient  Remains inpatient appropriate because:Unsafe d/c plan   Dispo: The patient is from: SNF              Anticipated d/c is to: SNF              Anticipated d/c date is: 1 day              Patient currently is medically stable to d/c.  Currently awaiting SNF bed PT/OT evaluation performed.  SNF recommended.  SNF appropriate as the patient has received 3 days of hospital care (or the 3 days stay Has been made by the patient's insurance company)  and is felt to need rehab services to restore this patient to their prior level of function to achieve safe transition back to home care.  This patient needs rehab services for at least 5 days/week and skilled nursing services daily to facilitate this transition.  Rehab is been requested as the most appropriate discharge option for this patient and is not felt to be custodial care.  Body mass index  is 29.68 kg/m.    Subjective: No complaints doing okay.  No acute events overnight  Review of Systems Otherwise negative except as per HPI, including: General: Denies fever, chills, night sweats or unintended weight loss. Resp: Denies cough, wheezing, shortness of breath. Cardiac: Denies chest pain, palpitations, orthopnea, paroxysmal nocturnal dyspnea. GI: Denies abdominal pain, nausea, vomiting, diarrhea or constipation GU: Denies dysuria, frequency, hesitancy or incontinence MS: Denies muscle aches, joint pain or swelling Neuro: Denies headache, neurologic deficits (focal weakness, numbness, tingling), abnormal gait Psych: Denies anxiety, depression, SI/HI/AVH Skin: Denies new rashes or lesions ID: Denies sick contacts, exotic exposures, travel Examination: Constitutional: Not in acute distress, appears chronically ill Respiratory: Clear to auscultation bilaterally Cardiovascular: Normal sinus rhythm, no rubs Abdomen: Nontender nondistended good bowel sounds Musculoskeletal: No edema noted Skin: No rashes seen Neurologic: CN 2-12 grossly intact.  And nonfocal Psychiatric: Normal judgment and insight. Alert and oriented x 3. Normal mood.  Objective: Vitals:   11/04/19 2355 11/05/19 0337 11/05/19 0500 11/05/19 0900  BP: (!) 159/94 (!) 148/88  (!) 141/84  Pulse: 89 82  88  Resp: 18 20  20   Temp: 98.7 F (37.1 C) 98.3 F (36.8 C)  98.9 F (37.2 C)  TempSrc: Oral Oral  Oral  SpO2: 100% 98%  94%  Weight:   73.6 kg   Height:        Intake/Output Summary (Last 24 hours) at 11/05/2019 1118 Last data filed at 11/05/2019 0900 Gross per 24 hour  Intake 360 ml  Output 300 ml  Net 60 ml   Filed Weights   11/02/19 0500 11/04/19 0458 11/05/19 0500  Weight: 78.4 kg 74.6 kg 73.6 kg     Data Reviewed:   CBC: Recent Labs  Lab 10/31/19 0438 11/02/19 0631  WBC 11.6* 10.7*  HGB 12.0 11.4*  HCT 38.1 36.4  MCV 85.6 85.0  PLT 275 266   Basic Metabolic Panel: Recent Labs    Lab 10/30/19 0350 10/31/19 0438 11/02/19 0631 11/03/19 1439 11/04/19 0457 11/05/19 0407  NA  --  138 140  --  139 139  K  --  3.2* 3.2* 3.7 3.2* 3.4*  CL  --  103 104  --  102 102  CO2  --  26 26  --  26 26  GLUCOSE  --  214* 167*  --  115* 145*  BUN  --  14 15  --  7 7  CREATININE  --  0.88 0.91  --  0.75 0.78  CALCIUM  --  8.5* 8.5*  --  8.9 9.0  MG 1.9 2.0  --   --   --  1.9   GFR: Estimated Creatinine Clearance: 77.3 mL/min (by C-G formula based on SCr of 0.78 mg/dL). Liver Function Tests: No results for input(s): AST, ALT, ALKPHOS, BILITOT, PROT, ALBUMIN in the last 168 hours. No results for input(s): LIPASE, AMYLASE in the last 168 hours. No results for input(s): AMMONIA in the last 168 hours. Coagulation Profile: No results for input(s): INR, PROTIME in the last 168 hours. Cardiac Enzymes: No results  for input(s): CKTOTAL, CKMB, CKMBINDEX, TROPONINI in the last 168 hours. BNP (last 3 results) No results for input(s): PROBNP in the last 8760 hours. HbA1C: No results for input(s): HGBA1C in the last 72 hours. CBG: Recent Labs  Lab 11/04/19 1139 11/04/19 1540 11/04/19 1609 11/04/19 2119 11/05/19 0634  GLUCAP 128* 59* 99 297* 140*   Lipid Profile: No results for input(s): CHOL, HDL, LDLCALC, TRIG, CHOLHDL, LDLDIRECT in the last 72 hours. Thyroid Function Tests: No results for input(s): TSH, T4TOTAL, FREET4, T3FREE, THYROIDAB in the last 72 hours. Anemia Panel: No results for input(s): VITAMINB12, FOLATE, FERRITIN, TIBC, IRON, RETICCTPCT in the last 72 hours. Sepsis Labs: No results for input(s): PROCALCITON, LATICACIDVEN in the last 168 hours.  No results found for this or any previous visit (from the past 240 hour(s)).       Radiology Studies: No results found.      Scheduled Meds: . amantadine  100 mg Oral BID  . atorvastatin  40 mg Oral Daily  . carvedilol  25 mg Oral BID WC  . chlorhexidine  15 mL Mouth Rinse BID  . docusate  100 mg Oral  BID  . enoxaparin (LOVENOX) injection  40 mg Subcutaneous Q24H  . hydrALAZINE  25 mg Oral TID  . insulin aspart  0-9 Units Subcutaneous TID WC  . insulin aspart  4 Units Subcutaneous TID WC  . insulin detemir  13 Units Subcutaneous BID  . mouth rinse  15 mL Mouth Rinse q12n4p  . pantoprazole  40 mg Oral QHS  . polyethylene glycol  17 g Oral Daily  . senna-docusate  1 tablet Oral BID   Continuous Infusions:   LOS: 21 days   Time spent= 15 mins    Melady Chow Joline Maxcy, MD [facility]  If 7PM-7AM, please contact night-coverage  11/05/2019, 11:18 AM

## 2019-11-05 NOTE — Progress Notes (Signed)
  Speech Language Pathology Treatment: Dysphagia  Patient Details Name: Latoya Cox MRN: [id number] DOB: [birth year] Today's Date: 11/05/2019 Time: 7672-0947 SLP Time Calculation (min) (ACUTE ONLY): 11 min  Assessment / Plan / Recommendation Clinical Impression  Pt lethargic but arousable. Vocal quality clear, delayed responses to questions with limited spontaneous output.  Pt participated in trials of thin liquid, but again could not drink three oz of water in succession in order to determine potential to tolerate thin liquids (hx silent aspiration per prior MBS). Required verbal/tactile cues, encouragement to open eyes, attend to task, and self-feed with assist to secure cup.  Recommend repeating MBS next date to evaluate changes in overall swallow physiology. Continue current diet pending study.   HPI HPI: Pt with acute headache and slurred speech, brought by EMS to hospital. CT head was obtained, revealing an ICH originating from the left basal ganglila, extending into the left lateral, third and 4th ventricles, with a small amount of blood in the right lateral ventricle as well. Early stages of hydrocephalus were also noted on CT. Acute hypoxia from respiratory failure, requiring intubation on 10/16/19. Extubated 10/21/19.      SLP Plan  Continue with current plan of care       Recommendations  Diet recommendations: Dysphagia 2 (fine chop);Nectar-thick liquid Liquids provided via: Cup;Straw Medication Administration: Crushed with puree Supervision: Staff to assist with self feeding;Full supervision/cueing for compensatory strategies Compensations: Minimize environmental distractions Postural Changes and/or Swallow Maneuvers: Seated upright 90 degrees                Oral Care Recommendations: Oral care BID Follow up Recommendations: Skilled Nursing facility SLP Visit Diagnosis: Dysphagia, unspecified (R13.10) Plan: Continue with current plan of care       GO                 [name] 11/05/2019, 2:40 PM  Bearett Porcaro L. Samson Frederic, MA CCC/SLP Acute Rehabilitation Services Office number [phone] Pager [phone]

## 2019-11-05 NOTE — Progress Notes (Signed)
Results for ADALIA, PETTIS (MRN [id number]) as of 11/05/2019 12:08  Ref. Range 11/04/2019 15:40 11/04/2019 16:09 11/04/2019 21:19 11/05/2019 06:34 11/05/2019 11:43  Glucose-Capillary Latest Ref Range: 70 - 99 mg/dL 59 (L) 99 702 (H) 637 (H) 199 (H)  Noted that patient had a low CBG of 59 mg/dl last night at 85:88.   Recommend decreasing Novolog meal coverage to 3 units TID if patient eats at least 50% of meal and if blood sugars continue to drop to less than 70 mg/dl.   Will continue to monitor blood sugars while in the hospital.   Smith Mince RN BSN CDE Diabetes Coordinator Pager: [phone]  8am-5pm

## 2019-11-06 ENCOUNTER — Inpatient Hospital Stay (HOSPITAL_COMMUNITY): Payer: No Typology Code available for payment source

## 2019-11-06 DIAGNOSIS — E1169 Type 2 diabetes mellitus with other specified complication: Secondary | ICD-10-CM | POA: Diagnosis not present

## 2019-11-06 DIAGNOSIS — E669 Obesity, unspecified: Secondary | ICD-10-CM | POA: Diagnosis not present

## 2019-11-06 DIAGNOSIS — I610 Nontraumatic intracerebral hemorrhage in hemisphere, subcortical: Secondary | ICD-10-CM | POA: Diagnosis not present

## 2019-11-06 DIAGNOSIS — I10 Essential (primary) hypertension: Secondary | ICD-10-CM | POA: Diagnosis not present

## 2019-11-06 LAB — BASIC METABOLIC PANEL WITH GFR
Anion gap: 9 (ref 5–15)
BUN: 13 mg/dL (ref 6–20)
CO2: 25 mmol/L (ref 22–32)
Calcium: 9 mg/dL (ref 8.9–10.3)
Chloride: 105 mmol/L (ref 98–111)
Creatinine, Ser: 0.92 mg/dL (ref 0.44–1.00)
GFR calc Af Amer: >60 mL/min
GFR calc non Af Amer: >60 mL/min
Glucose, Bld: 156 mg/dL — ABNORMAL HIGH (ref 70–99)
Potassium: 3.7 mmol/L (ref 3.5–5.1)
Sodium: 139 mmol/L (ref 135–145)

## 2019-11-06 LAB — GLUCOSE, CAPILLARY
Glucose-Capillary: 152 mg/dL — ABNORMAL HIGH (ref 70–99)
Glucose-Capillary: 142 mg/dL — ABNORMAL HIGH (ref 70–99)
Glucose-Capillary: 262 mg/dL — ABNORMAL HIGH (ref 70–99)
Glucose-Capillary: 118 mg/dL — ABNORMAL HIGH (ref 70–99)

## 2019-11-06 LAB — MAGNESIUM: Magnesium: 1.9 mg/dL (ref 1.7–2.4)

## 2019-11-06 MED ORDER — POTASSIUM CHLORIDE CRYS ER 20 MEQ PO TBCR
40.0000 meq | EXTENDED_RELEASE_TABLET | Freq: Once | ORAL | Status: AC
  Administered 2019-11-06: 40 meq via ORAL
  Filled 2019-11-06: qty 2

## 2019-11-06 NOTE — Progress Notes (Signed)
Modified Barium Swallow Progress Note  Patient Details  Name: Latoya Cox MRN: [id number] Date of Birth: [birth year]  Today's Date: 11/06/2019  Modified Barium Swallow completed.  Full report located under Chart Review in the Imaging Section.  Brief recommendations include the following:  Clinical Impression  Pt presents with much improved oropharyngeal swallow function - she continues to have some persisting difficulty with oral manipulation/cohesion of mechanical solids, but once she propels them into pharynx, they pass swiftly through UES with no residue.  Thin liquids fill the pyriform sinuses prior to initiation of the pharyngeal swallow, however there is reliable laryngeal vestibule closure with no penetration nor aspiration.  Recommend continuing a dysphagia 2 diet for now; advance liquids to thin.  Give meds whole in puree. Pt was pleased with progress.    Swallow Evaluation Recommendations       SLP Diet Recommendations: Dysphagia 2 (Fine chop) solids;Thin liquid   Liquid Administration via: Cup;Straw   Medication Administration: Whole meds with puree   Supervision: Staff to assist with self feeding;Full supervision/cueing for compensatory strategies   Compensations: Minimize environmental distractions       Oral Care Recommendations: Oral care BID      Elena Davia L. Samson Frederic, MA CCC/SLP Acute Rehabilitation Services Office number [phone] Pager [phone]   [name] 11/06/2019,2:41 PM

## 2019-11-06 NOTE — Progress Notes (Signed)
Occupational Therapy Treatment Patient Details Name: Latoya Cox MRN: [id number] DOB: [birth year] Today's Date: 11/06/2019    History of present illness 54 y.o. female with DM, HTN and arthritis, [name] and depressed level of consciousness with garbled speech at home, after complaining of a headache earlier in the day. STAT CT head was obtained, revealing an ICH originating from the left basal ganglila, extending into the left lateral, third and 4th ventricles, with a small amount of blood in the right lateral ventricle as well. Early stages of hydrocephalus were also noted on CT. Pt underwent L frontal IVC placement and intubation on 6/18. Pt extubated on 6/23.   OT comments  Pt received supine in bed asleep upon arrival, but able to arouse. Limited session d/t pts level of arousal as pt lethargic throughout session noted to kept eyes closed and required cues to orient to task. Pt completed stand pivot transfer from EOB>recliner with RW and MIN- MOD A. During transfer, pt reports " not feeling right" c/o headache and pain in stomach BP taken in recliner 139/98. - Alerted RN. DC plan remains appropriate, will follow acutely per POC.   Follow Up Recommendations  SNF    Equipment Recommendations  None recommended by OT (TBD at next venue of care)    Recommendations for Other Services      Precautions / Restrictions Precautions Precautions: Fall Restrictions Weight Bearing Restrictions: No       Mobility Bed Mobility Overal bed mobility: Needs Assistance Bed Mobility: Supine to Sit     Supine to sit: Min guard     General bed mobility comments: very slow to mobilize needing cues to initiate task and sequence steps, but min guard overall for safety and to initially obtain balance  Transfers Overall transfer level: Needs assistance Equipment used: Rolling walker (2 wheeled) Transfers: Sit to/from [employer] Sit to Stand: Mod assist Stand pivot transfers: Min assist;Mod assist       General transfer comment: MOD A to power up into standing with cues for hand placement from EOB; MIN - MOD A to manage RW and pivot to chair d/t impaired balance with pt noted to close eyes abruptly during transfer stating " I feel off" MOD A to control descent into chair. BP WFL    Balance Overall balance assessment: Needs assistance Sitting-balance support: No upper extremity supported;Feet supported Sitting balance-Leahy Scale: Fair Sitting balance - Comments: close min guard d/t level of arousal   Standing balance support: Bilateral upper extremity supported;During functional activity Standing balance-Leahy Scale: Poor Standing balance comment: reliant on external support                           ADL either performed or assessed with clinical judgement   ADL Overall ADL's : Needs assistance/impaired                         Toilet Transfer: Minimal assistance;Moderate assistance;Stand-pivot;RW Toilet Transfer Details (indicate cue type and reason): MIN - MOD A for simulated toilet transfer to recliner with pt needing MOD A to initially power into standing and intermittent MIN- MODA for balance and to manage RW         Functional mobility during ADLs: Minimal assistance;Moderate assistance;Rolling walker;Cueing for safety General ADL Comments: pt very flat and noted to keep eyes closed most of session.  Vision       Perception     Praxis      Cognition Arousal/Alertness: Lethargic (moments of wakefulness and moments of drowsiness) Behavior During Therapy: Flat affect Overall Cognitive Status: Impaired/Different from baseline Area of Impairment: Orientation;Attention;Following commands;Awareness;Problem solving                 Orientation Level: Disoriented to;Time (its "monday" cued pt that the 4th of july was sunday however pt still unable to problem  solve month) Current Attention Level: Sustained   Following Commands: Follows one step commands with increased time   Awareness: Intellectual Problem Solving: Slow processing;Decreased initiation;Difficulty sequencing;Requires verbal cues General Comments: pt very flat and disoriented to date. pt noted to keep eyes closed mostly during session and requries cues to attend to task        Exercises     Shoulder Instructions       General Comments BP post mobility 123/83. BP in chair 130/98    Pertinent Vitals/ Pain       Pain Assessment: Faces Faces Pain Scale: Hurts even more Pain Location: head/ stomach Pain Descriptors / Indicators: Grimacing;Discomfort;Headache Pain Intervention(s): Limited activity within patient's tolerance;Monitored during session;Repositioned  Home Living                                          Prior Functioning/Environment              Frequency  Min 2X/week        Progress Toward Goals  OT Goals(current goals can now be found in the care plan section)  Progress towards OT goals: Progressing toward goals  Acute Rehab OT Goals Patient Stated Goal: To improve mobility OT Goal Formulation: Patient unable to participate in goal setting Time For Goal Achievement: 11/16/19 (noted OTR updated goals 7/5) Potential to Achieve Goals: Good  Plan Discharge plan remains appropriate;Frequency remains appropriate    Co-evaluation                 AM-PAC OT "6 Clicks" Daily Activity     Outcome Measure   Help from another person eating meals?: A Lot Help from another person taking care of personal grooming?: A Lot Help from another person toileting, which includes using toliet, bedpan, or urinal?: A Lot Help from another person bathing (including washing, rinsing, drying)?: A Lot Help from another person to put on and taking off regular upper body clothing?: A Lot Help from another person to put on and taking off regular  lower body clothing?: A Lot 6 Click Score: 12    End of Session Equipment Utilized During Treatment: Rolling walker;Gait belt  OT Visit Diagnosis: Other abnormalities of gait and mobility (R26.89);Hemiplegia and hemiparesis;Other symptoms and signs involving the nervous system (R29.898) Hemiplegia - Right/Left: Right Hemiplegia - dominant/non-dominant: Dominant Hemiplegia - caused by: Nontraumatic intracerebral hemorrhage   Activity Tolerance Patient limited by fatigue   Patient Left in chair;with call bell/phone within reach;with chair alarm set   Nurse Communication Mobility status;Other (comment) (c/o headcahe and stomach pain)        Time: 4403-4742 OT Time Calculation (min): 21 min  Charges: OT General Charges $OT Visit: 1 Visit OT Treatments $Therapeutic Activity: 8-22 mins  Audery Amel., COTA/L Acute Rehabilitation Services [phone]    Angelina Pih 11/06/2019, 12:06 PM

## 2019-11-06 NOTE — Plan of Care (Signed)
  Problem: Coping: Goal: Will verbalize positive feelings about self Outcome: Progressing Goal: Will identify appropriate support needs Outcome: Progressing   Problem: Nutrition: Goal: Risk of aspiration will decrease Outcome: Progressing

## 2019-11-06 NOTE — Progress Notes (Signed)
PROGRESS NOTE    Latoya Cox  QVZ:[id number] DOB: 11-Jul-1965 DOA: 10/15/2019 PCP: Myrlene Broker, MD   Brief Narrative:  54 y.o.femalewith DM, HTN and arthritis,[name] and depressed level of consciousness with garbled speech at home, after complaining of a headacheearlier in the day. On EMS arrival, they notedright facial droop.She vomited en route and continued to vomit intermittently on arrival to the ED. STAT CT head was obtained, revealing an ICH originating from the left basal ganglila, extending into the left lateral, third and 4th ventricles, with a small amount of blood in the right lateral ventricle as well. Early stages of hydrocephalus were also noted on CT. Patient admitted to ICU by Neuro.Seen by NSU-EVD placed. Remained in ICU on vent support , subsequently weaned and transferred out to [facility] on Hosp day 9 ( 6/27) with concerns for Hypernatremia 154, AKI 42/1.1 and hyperkalemia - resolved.  PT recommended SNF placement, currently awaiting placement.    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  MRI head was not performed due to history of previous aneurysm clips.   CTA of the head on 6/19 did not reveal any recurrent aneurysm.  S/p EVD while in ICU with improvement.  CT head on June 24 showed interval improvement. Repeat CT head due to lethargy since 11/6-negative for any acute pathology.  Now resolved. PT OT-SNF, awaiting placement  Obstructive hydrocephalus:  Seen by  neurosurgery and underwent EVD.  CT head on June 24 as well as 7/5 showed interval improvement of thalamic hemorrhage and resolution of IVH.  There was no evidence of hydrocephalus. EVD self removed 6/24, no acute issues and remained stable.   Acute hypoxic respiratory failure: Resolved  Hypernatremia, hypokalemia:   Hyponatremia resolved.  Intermittent potassium repletion  Essential Hypertension- Currently  Coreg, hydralazine.  Clonidine discontinued.   Dysphagia: Currently on dysphagia 2 diet, speech and swallow therapy following.  Diabetes mellitus type II, uncontrolled due to hyperglycemia Hemoglobin A1c 7.7, resume outpatient meds at discharge. Continue Levemir 13 units twice daily, NovoLog 4 units 3 times daily AC, sliding scale insulin  PSVT Replete electrolytes as appropriate.  Continue Coreg  Hyperlipidemia: LDL 186, goal <70, Continue statin-Lipitor 40 mg daily     DVT prophylaxis: enoxaparin (LOVENOX) injection 40 mg Start: 10/19/19 0900 Place and maintain sequential compression device Start: 10/16/19 1205  Family Communication:  None at bedside  Status is: Inpatient  Remains inpatient appropriate because:Unsafe d/c plan   Dispo: The patient is from: SNF              Anticipated d/c is to: SNF              Anticipated d/c date is: 1 day              Patient currently is medically stable to d/c.  Currently awaiting SNF bed PT/OT evaluation performed.  SNF recommended.  SNF appropriate as the patient has received 3 days of hospital care (or the 3 days stay Has been made by the  patient's insurance company) and is felt to need rehab services to restore this patient to their prior level of function to achieve safe transition back to home care.  This patient needs rehab services for at least 5 days/week and skilled nursing services daily to facilitate this transition.  Rehab is been requested as the most appropriate discharge option for this patient and is not felt to  be custodial care.  Body mass index is 29.76 kg/m.    Subjective: Feels okay no complaints.  She has been out of bed to chair  Review of Systems Otherwise negative except as per HPI, including: General: Denies fever, chills, night sweats or unintended weight loss. Resp: Denies cough, wheezing, shortness of breath. Cardiac: Denies chest pain, palpitations, orthopnea, paroxysmal nocturnal dyspnea. GI: Denies abdominal pain, nausea, vomiting, diarrhea or constipation GU: Denies dysuria, frequency, hesitancy or incontinence MS: Denies muscle aches, joint pain or swelling Neuro: Denies headache, neurologic deficits (focal weakness, numbness, tingling), abnormal gait Psych: Denies anxiety, depression, SI/HI/AVH Skin: Denies new rashes or lesions ID: Denies sick contacts, exotic exposures, travel  Examination: Constitutional: Not in acute distress, overall appears chronically ill Respiratory: Clear to auscultation bilaterally Cardiovascular: Normal sinus rhythm, no rubs Abdomen: Nontender nondistended good bowel sounds Musculoskeletal: No edema noted Skin: No rashes seen Neurologic: CN 2-12 grossly intact.  And nonfocal Psychiatric: Normal judgment and insight. Alert and oriented x 3. Normal mood. Objective: Vitals:   11/05/19 2330 11/06/19 0308 11/06/19 0500 11/06/19 0848  BP: (!) 155/85 (!) 156/97  (!) 143/92  Pulse: 89 87  92  Resp: 18 19  14   Temp: 99.7 F (37.6 C) 99.9 F (37.7 C)  97.8 F (36.6 C)  TempSrc: Oral Oral  Oral  SpO2: 98% 99%  99%  Weight:   73.8 kg   Height:        Intake/Output Summary (Last 24 hours) at 11/06/2019 1028 Last data filed at 11/05/2019 1800 Gross per 24 hour  Intake 120 ml  Output --  Net 120 ml   Filed Weights   11/04/19 0458 11/05/19 0500 11/06/19 0500  Weight: 74.6 kg 73.6 kg 73.8 kg     Data Reviewed:   CBC: Recent Labs  Lab 10/31/19 0438 11/02/19 0631  WBC 11.6* 10.7*  HGB 12.0 11.4*  HCT 38.1 36.4  MCV 85.6 85.0  PLT  275 266   Basic Metabolic Panel: Recent Labs  Lab 10/31/19 0438 10/31/19 0438 11/02/19 0631 11/03/19 1439 11/04/19 0457 11/05/19 0407 11/06/19 0501  NA 138  --  140  --  139 139 139  K 3.2*   < > 3.2* 3.7 3.2* 3.4* 3.7  CL 103  --  104  --  102 102 105  CO2 26  --  26  --  26 26 25   GLUCOSE 214*  --  167*  --  115* 145* 156*  BUN 14  --  15  --  7 7 13   CREATININE 0.88  --  0.91  --  0.75 0.78 0.92  CALCIUM 8.5*  --  8.5*  --  8.9 9.0 9.0  MG 2.0  --   --   --   --  1.9 1.9   < > = values in this interval not displayed.   GFR: Estimated Creatinine Clearance: 67.3 mL/min (by C-G formula based on SCr of 0.92 mg/dL). Liver Function Tests: No results for input(s): AST, ALT, ALKPHOS, BILITOT, PROT, ALBUMIN in the last 168 hours. No results for input(s): LIPASE, AMYLASE in  the last 168 hours. No results for input(s): AMMONIA in the last 168 hours. Coagulation Profile: No results for input(s): INR, PROTIME in the last 168 hours. Cardiac Enzymes: No results for input(s): CKTOTAL, CKMB, CKMBINDEX, TROPONINI in the last 168 hours. BNP (last 3 results) No results for input(s): PROBNP in the last 8760 hours. HbA1C: No results for input(s): HGBA1C in the last 72 hours. CBG: Recent Labs  Lab 11/05/19 0634 11/05/19 1143 11/05/19 1640 11/05/19 2111 11/06/19 0611  GLUCAP 140* 199* 146* 196* 152*   Lipid Profile: No results for input(s): CHOL, HDL, LDLCALC, TRIG, CHOLHDL, LDLDIRECT in the last 72 hours. Thyroid Function Tests: No results for input(s): TSH, T4TOTAL, FREET4, T3FREE, THYROIDAB in the last 72 hours. Anemia Panel: No results for input(s): VITAMINB12, FOLATE, FERRITIN, TIBC, IRON, RETICCTPCT in the last 72 hours. Sepsis Labs: No results for input(s): PROCALCITON, LATICACIDVEN in the last 168 hours.  No results found for this or any previous visit (from the past 240 hour(s)).       Radiology Studies: No results found.      Scheduled Meds: . amantadine   100 mg Oral BID  . atorvastatin  40 mg Oral Daily  . carvedilol  25 mg Oral BID WC  . chlorhexidine  15 mL Mouth Rinse BID  . docusate  100 mg Oral BID  . enoxaparin (LOVENOX) injection  40 mg Subcutaneous Q24H  . hydrALAZINE  25 mg Oral TID  . insulin aspart  0-9 Units Subcutaneous TID WC  . insulin aspart  4 Units Subcutaneous TID WC  . insulin detemir  13 Units Subcutaneous BID  . mouth rinse  15 mL Mouth Rinse q12n4p  . pantoprazole  40 mg Oral QHS  . polyethylene glycol  17 g Oral Daily  . senna-docusate  1 tablet Oral BID   Continuous Infusions:   LOS: 22 days   Time spent= 15 mins    Audi Wettstein Joline Maxcy, MD [facility]  If 7PM-7AM, please contact night-coverage  11/06/2019, 10:28 AM

## 2019-11-06 NOTE — Progress Notes (Signed)
Physical Therapy Treatment Patient Details Name: Latoya Cox MRN: [id number] DOB: 1965-09-04 Today's Date: 11/06/2019    History of Present Illness 54 y.o. female with DM, HTN and arthritis, [name] and depressed level of consciousness with garbled speech at home, after complaining of a headache earlier in the day. STAT CT head was obtained, revealing an ICH originating from the left basal ganglila, extending into the left lateral, third and 4th ventricles, with a small amount of blood in the right lateral ventricle as well. Early stages of hydrocephalus were also noted on CT. Pt underwent L frontal IVC placement and intubation on 6/18. Pt extubated on 6/23.    PT Comments    Slow to focus and agree to participate.  Needs occasional redirection to task.  Emphasis on transitions, transfer safety and progressing gait stability/stamina.    Follow Up Recommendations  SNF     Equipment Recommendations  Rolling walker with 5" wheels;3in1 (PT)    Recommendations for Other Services       Precautions / Restrictions Precautions Precautions: Fall Restrictions Weight Bearing Restrictions: No    Mobility  Bed Mobility Overal bed mobility: Needs Assistance Bed Mobility: Supine to Sit     Supine to sit: Min guard     General bed mobility comments: up in the chair on arrival  Transfers Overall transfer level: Needs assistance Equipment used: Rolling walker (2 wheeled) Transfers: Sit to/from Stand Sit to Stand: Min assist Stand pivot transfers: Min assist;Mod assist       General transfer comment: cues for hand placement  Ambulation/Gait Ambulation/Gait assistance: Min assist Gait Distance (Feet): 150 Feet (x2) Assistive device: Rolling walker (2 wheeled) Gait Pattern/deviations: Step-through pattern Gait velocity: decreased Gait velocity interpretation: <1.8 ft/sec, indicate of risk for recurrent falls General  Gait Details: mildly unsteady in the RW, episodes on drift R and stagger R with loss of focus on task.  Tends to be mildly retropulsive and runs into obstacles on the right at times, taking a few seconds to "untangle" herself.   Stairs             Wheelchair Mobility    Modified Rankin (Stroke Patients Only) Modified Rankin (Stroke Patients Only) Modified Rankin: Moderately severe disability     Balance Overall balance assessment: Needs assistance Sitting-balance support: No upper extremity supported;Feet supported Sitting balance-Leahy Scale: Fair Sitting balance - Comments: close min guard d/t level of arousal   Standing balance support: Bilateral upper extremity supported;During functional activity Standing balance-Leahy Scale: Poor Standing balance comment: reliant on external support or AD, but notably improved over the last week.                            Cognition Arousal/Alertness: Awake/alert Behavior During Therapy: Flat affect;WFL for tasks assessed/performed Overall Cognitive Status: Impaired/Different from baseline Area of Impairment: Orientation;Attention;Following commands;Awareness;Problem solving                 Orientation Level: Time Current Attention Level: Sustained;Focused Memory: Decreased recall of precautions;Decreased short-term memory Following Commands: Follows one step commands with increased time Safety/Judgement: Decreased awareness of safety;Decreased awareness of deficits Awareness: Intellectual Problem Solving: Slow processing;Decreased initiation;Difficulty sequencing General Comments: pt very flat and disoriented to date. pt noted to keep eyes closed mostly during session and requries cues to attend to task      Exercises      General Comments General  comments (skin integrity, edema, etc.): BP post mobility 123/83. BP in chair 130/98      Pertinent Vitals/Pain Pain Assessment: Faces Faces Pain Scale: No  hurt Pain Location: head/ stomach Pain Descriptors / Indicators: Grimacing;Discomfort;Headache Pain Intervention(s): Monitored during session    Home Living                      Prior Function            PT Goals (current goals can now be found in the care plan section) Acute Rehab PT Goals Patient Stated Goal: To improve mobility PT Goal Formulation: Patient unable to participate in goal setting Time For Goal Achievement: 11/14/19 Potential to Achieve Goals: Fair Progress towards PT goals: Progressing toward goals    Frequency    Min 3X/week      PT Plan Current plan remains appropriate    Co-evaluation              AM-PAC PT "6 Clicks" Mobility   Outcome Measure  Help needed turning from your back to your side while in a flat bed without using bedrails?: A Little Help needed moving from lying on your back to sitting on the side of a flat bed without using bedrails?: A Little Help needed moving to and from a bed to a chair (including a wheelchair)?: A Little Help needed standing up from a chair using your arms (e.g., wheelchair or bedside chair)?: A Little Help needed to walk in hospital room?: A Little Help needed climbing 3-5 steps with a railing? : A Lot 6 Click Score: 17    End of Session   Activity Tolerance: Patient tolerated treatment well Patient left: in chair;with call bell/phone within reach;with chair alarm set;with family/visitor present Nurse Communication: Mobility status PT Visit Diagnosis: Other abnormalities of gait and mobility (R26.89);Other symptoms and signs involving the nervous system (R29.898)     Time: 9417-4081 PT Time Calculation (min) (ACUTE ONLY): 25 min  Charges:  $Gait Training: 8-22 mins $Therapeutic Activity: 8-22 mins                     11/06/2019  Jacinto Halim., PT Acute Rehabilitation Services [phone]  (pager) [phone]  (office)   Latoya Cox 11/06/2019, 1:47 PM

## 2019-11-07 DIAGNOSIS — R4182 Altered mental status, unspecified: Secondary | ICD-10-CM | POA: Diagnosis not present

## 2019-11-07 DIAGNOSIS — I610 Nontraumatic intracerebral hemorrhage in hemisphere, subcortical: Secondary | ICD-10-CM | POA: Diagnosis not present

## 2019-11-07 DIAGNOSIS — J9601 Acute respiratory failure with hypoxia: Secondary | ICD-10-CM | POA: Diagnosis not present

## 2019-11-07 DIAGNOSIS — E1169 Type 2 diabetes mellitus with other specified complication: Secondary | ICD-10-CM | POA: Diagnosis not present

## 2019-11-07 LAB — BASIC METABOLIC PANEL
Anion gap: 10 (ref 5–15)
BUN: 18 mg/dL (ref 6–20)
CO2: 22 mmol/L (ref 22–32)
Calcium: 9.2 mg/dL (ref 8.9–10.3)
Chloride: 110 mmol/L (ref 98–111)
Creatinine, Ser: 1 mg/dL (ref 0.44–1.00)
GFR calc Af Amer: >60 mL/min (ref >60)
GFR calc non Af Amer: >60 mL/min (ref >60)
Glucose, Bld: 102 mg/dL — ABNORMAL HIGH (ref 70–99)
Potassium: 3.9 mmol/L (ref 3.5–5.1)
Sodium: 142 mmol/L (ref 135–145)

## 2019-11-07 LAB — GLUCOSE, CAPILLARY
Glucose-Capillary: 114 mg/dL — ABNORMAL HIGH (ref 70–99)
Glucose-Capillary: 127 mg/dL — ABNORMAL HIGH (ref 70–99)
Glucose-Capillary: 106 mg/dL — ABNORMAL HIGH (ref 70–99)
Glucose-Capillary: 126 mg/dL — ABNORMAL HIGH (ref 70–99)
Glucose-Capillary: 88 mg/dL (ref 70–99)

## 2019-11-07 LAB — MAGNESIUM: Magnesium: 2 mg/dL (ref 1.7–2.4)

## 2019-11-07 MED ORDER — SODIUM CHLORIDE 0.9 % IV SOLN: INTRAVENOUS | Status: AC

## 2019-11-07 NOTE — Plan of Care (Signed)
Pt oriented x person place and situation.  Orientation and alertness seem to be improving as the shift goes on.  On RA.  VSS.  No c/o pain.  Poor appetite noted.  Restarted ivf this shift.  Denies constipation.  Urine output is adequate.  Pt educated on risk factors for hemorrhagic stroke.

## 2019-11-07 NOTE — Progress Notes (Signed)
PROGRESS NOTE    Latoya Cox  ZOX:[id number] DOB: 01/03/1966 DOA: 10/15/2019 PCP: Myrlene Brokerrawford, Elizabeth A, MD   Brief Narrative:  54 y.o.femalewith DM, HTN and arthritis,[name] and depressed level of consciousness with garbled speech at home, after complaining of a headacheearlier in the day. On EMS arrival, they notedright facial droop.She vomited en route and continued to vomit intermittently on arrival to the ED. STAT CT head was obtained, revealing an ICH originating from the left basal ganglila, extending into the left lateral, third and 4th ventricles, with a small amount of blood in the right lateral ventricle as well. Early stages of hydrocephalus were also noted on CT. Patient admitted to ICU by Neuro.Seen by NSU-EVD placed. Remained in ICU on vent support , subsequently we[facility] ( [facility] with concerns for Hypernatremia 154, AKI 42/1.1 and hyperkalemia - resolved.  PT recommended SNF placement, currently awaiting placement.    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  MRI head was not performed due to history of previous aneurysm clips.   CTA of the head on 6/19 did not reveal any recurrent aneurysm.  S/p EVD while in ICU with improvement.  CT head on June 24 showed interval improvement. Repeat CT head due to lethargy since 11/6-negative for any acute pathology.  Now resolved. PT OT-SNF, still awaiting placement  Mild dehydration with dark  urine -Normal saline 75 cc/h  Obstructive hydrocephalus:  Seen by neurosurgery and underwent EVD.  CT head on June 24 as well as 7/5 showed interval improvement of thalamic hemorrhage and resolution of IVH.  There was no evidence of hydrocephalus. EVD self removed 6/24, no acute issues and remained stable.   Acute hypoxic respiratory failure: Resolved  Hypernatremia, hypokalemia:   Hyponatremia resolved.  Intermittent potassium repletion  Essential Hypertension- Currently  Coreg, hydralazine.  Clonidine discontinued.   Dysphagia: Currently on dysphagia 2 diet, speech and swallow therapy following.  Diabetes mellitus type II, uncontrolled due to hyperglycemia Hemoglobin A1c 7.7, resume outpatient meds at discharge. Continue Levemir 13 units twice daily, NovoLog 4 units 3 times daily AC, sliding scale insulin  PSVT Replete electrolytes as appropriate.  Continue Coreg  Hyperlipidemia: LDL 186, goal <70, Continue statin-Lipitor 40 mg daily     DVT prophylaxis: enoxaparin (LOVENOX) injection 40 mg Start: 10/19/19 0900 Place and maintain sequential compression device Start: 10/16/19 1205  Family Communication:  None at bedside  Status is: Inpatient  Remains inpatient appropriate because:Unsafe d/c plan   Dispo: The patient is from: SNF              Anticipated d/c is to: SNF              Anticipated d/c date is: 1 day              Patient currently is medically stable to d/c.  Currently awaiting SNF bed PT/OT evaluation performed.  SNF recommended.  SNF appropriate as the patient has received 3 days of hospital  care (or the 3 days stay Has been made by the patient's insurance company) and is felt to need rehab services to restore this patient to their prior level of function to achieve safe transition back to home care.  This patient needs rehab services for at least 5 days/week and skilled nursing services daily to facilitate this transition.  Rehab is been requested as  the most appropriate discharge option for this patient and is not felt to be custodial care.  Body mass index is 30 kg/m.    Subjective: No acute events overnight, no complaints  Review of Systems Otherwise negative except as per HPI, including: General: Denies fever, chills, night sweats or unintended weight loss. Resp: Denies cough, wheezing, shortness of breath. Cardiac: Denies chest pain, palpitations, orthopnea, paroxysmal nocturnal dyspnea. GI: Denies abdominal pain, nausea, vomiting, diarrhea or constipation GU: Denies dysuria, frequency, hesitancy or incontinence MS: Denies muscle aches, joint pain or swelling Neuro: Denies headache, neurologic deficits (focal weakness, numbness, tingling), abnormal gait Psych: Denies anxiety, depression, SI/HI/AVH Skin: Denies new rashes or lesions ID: Denies sick contacts, exotic exposures, travel  Examination: Constitutional: Not in acute distress, chronically ill-appearing  respiratory: Clear to auscultation bilaterally Cardiovascular: Normal sinus rhythm, no rubs Abdomen: Nontender nondistended good bowel sounds Musculoskeletal: No edema noted Skin: No rashes seen Neurologic: CN 2-12 grossly intact.  And nonfocal Psychiatric: Normal judgment and insight. Alert and oriented x 3. Normal mood.  Dark urine in her canister  Objective: Vitals:   11/07/19 0500 11/07/19 0809 11/07/19 0825 11/07/19 0900  BP:  (!) 155/117    Pulse:  87    Resp:  14 13 11   Temp:  98.3 F (36.8 C)    TempSrc:  Oral    SpO2:  100%    Weight: 74.4 kg     Height:        Intake/Output Summary (Last 24 hours) at 11/07/2019 1012 Last data filed at 11/07/2019 0825 Gross per 24 hour  Intake 80 ml  Output --  Net 80 ml   Filed Weights   11/05/19 0500 11/06/19 0500 11/07/19 0500  Weight: 73.6 kg 73.8 kg 74.4 kg     Data Reviewed:   CBC: Recent Labs  Lab 11/02/19 0631  WBC 10.7*  HGB 11.4*  HCT 36.4  MCV 85.0  PLT 266   Basic Metabolic  Panel: Recent Labs  Lab 11/02/19 0631 11/02/19 0631 11/03/19 1439 11/04/19 0457 11/05/19 0407 11/06/19 0501 11/07/19 0424  NA 140  --   --  139 139 139 142  K 3.2*   < > 3.7 3.2* 3.4* 3.7 3.9  CL 104  --   --  102 102 105 110  CO2 26  --   --  26 26 25 22   GLUCOSE 167*  --   --  115* 145* 156* 102*  BUN 15  --   --  7 7 13 18   CREATININE 0.91  --   --  0.75 0.78 0.92 1.00  CALCIUM 8.5*  --   --  8.9 9.0 9.0 9.2  MG  --   --   --   --  1.9 1.9 2.0   < > = values in this interval not displayed.   GFR: Estimated Creatinine Clearance: 62.1 mL/min (by C-G formula based on SCr of 1 mg/dL). Liver Function Tests: No results for input(s): AST, ALT, ALKPHOS, BILITOT, PROT, ALBUMIN in the last 168 hours. No results for input(s): LIPASE, AMYLASE in the last 168 hours. No  results for input(s): AMMONIA in the last 168 hours. Coagulation Profile: No results for input(s): INR, PROTIME in the last 168 hours. Cardiac Enzymes: No results for input(s): CKTOTAL, CKMB, CKMBINDEX, TROPONINI in the last 168 hours. BNP (last 3 results) No results for input(s): PROBNP in the last 8760 hours. HbA1C: No results for input(s): HGBA1C in the last 72 hours. CBG: Recent Labs  Lab 11/06/19 1203 11/06/19 1534 11/06/19 2140 11/07/19 0613 11/07/19 0756  GLUCAP 142* 262* 118* 114* 127*   Lipid Profile: No results for input(s): CHOL, HDL, LDLCALC, TRIG, CHOLHDL, LDLDIRECT in the last 72 hours. Thyroid Function Tests: No results for input(s): TSH, T4TOTAL, FREET4, T3FREE, THYROIDAB in the last 72 hours. Anemia Panel: No results for input(s): VITAMINB12, FOLATE, FERRITIN, TIBC, IRON, RETICCTPCT in the last 72 hours. Sepsis Labs: No results for input(s): PROCALCITON, LATICACIDVEN in the last 168 hours.  No results found for this or any previous visit (from the past 240 hour(s)).       Radiology Studies: DG Swallowing Func-Speech Pathology  Result Date: 11/06/2019 Objective Swallowing Evaluation:  Type of Study: MBS-Modified Barium Swallow Study  Patient Details Name: Ahlayah Tarkowski MRN: [id number] Date of Birth: 08-20-1965 Today's Date: 11/06/2019 Time: SLP Start Time (ACUTE ONLY): 1335 -SLP Stop Time (ACUTE ONLY): 1400 SLP Time Calculation (min) (ACUTE ONLY): 25 min Past Medical History: Past Medical History: Diagnosis Date  Arthritis   Chest wall pain   Diabetes mellitus without complication (HCC)   Hypertension  Past Surgical History: Past Surgical History: Procedure Laterality Date  aneurism repair    lapband   HPI: Pt with acute headache and slurred speech, brought by EMS to hospital. CT head was obtained, revealing an ICH originating from the left basal ganglila, extending into the left lateral, third and 4th ventricles, with a small amount of blood in the right lateral ventricle as well. Early stages of hydrocephalus were also noted on CT. Acute hypoxia from respiratory failure, requiring intubation on 10/16/19. Extubated 10/21/19.  Subjective: alert, cooperative, needs cues Assessment / Plan / Recommendation CHL IP CLINICAL IMPRESSIONS 11/06/2019 Clinical Impression Pt presents with much improved oropharyngeal swallow function - she continues to have some persisting difficulty with oral manipulation/cohesion of mechanical solids, but once she propels them into pharynx, they pass swiftly through UES with no residue.  Thin liquids fill the pyriform sinuses prior to initiation of the pharyngeal swallow, however there is reliable laryngeal vestibule closure with no penetration nor aspiration.  Recommend continuing a dysphagia 2 diet for now; advance liquids to thin.  Give meds whole in puree. Pt was pleased with progress.  SLP Visit Diagnosis Dysphagia, oral phase (R13.11) Attention and concentration deficit following -- Frontal lobe and executive function deficit following -- Impact on safety and function --   CHL IP TREATMENT RECOMMENDATION 11/06/2019 Treatment Recommendations Therapy as outlined in  treatment plan below   Prognosis 11/06/2019 Prognosis for Safe Diet Advancement Good Barriers to Reach Goals -- Barriers/Prognosis Comment -- CHL IP DIET RECOMMENDATION 11/06/2019 SLP Diet Recommendations Dysphagia 2 (Fine chop) solids;Thin liquid Liquid Administration via Cup;Straw Medication Administration Whole meds with puree Compensations Minimize environmental distractions Postural Changes --   CHL IP OTHER RECOMMENDATIONS 11/06/2019 Recommended Consults -- Oral Care Recommendations Oral care BID Other Recommendations --   CHL IP FOLLOW UP RECOMMENDATIONS 11/06/2019 Follow up Recommendations Skilled Nursing facility   [facility] IP FREQUENCY AND DURATION 11/06/2019 Speech Therapy Frequency (ACUTE ONLY) min 2x/week Treatment Duration --      CHL IP ORAL PHASE 11/06/2019  Oral Phase Impaired Oral - Pudding Teaspoon -- Oral - Pudding Cup -- Oral - Honey Teaspoon -- Oral - Honey Cup -- Oral - Nectar Teaspoon -- Oral - Nectar Cup NT Oral - Nectar Straw NT Oral - Thin Teaspoon -- Oral - Thin Cup WFL Oral - Thin Straw WFL Oral - Puree WFL Oral - Mech Soft Delayed oral transit Oral - Regular -- Oral - Multi-Consistency -- Oral - Pill -- Oral Phase - Comment --  CHL IP PHARYNGEAL PHASE 11/06/2019 Pharyngeal Phase WFL Pharyngeal- Pudding Teaspoon -- Pharyngeal -- Pharyngeal- Pudding Cup -- Pharyngeal -- Pharyngeal- Honey Teaspoon -- Pharyngeal -- Pharyngeal- Honey Cup -- Pharyngeal -- Pharyngeal- Nectar Teaspoon -- Pharyngeal -- Pharyngeal- Nectar Cup NT Pharyngeal -- Pharyngeal- Nectar Straw NT Pharyngeal -- Pharyngeal- Thin Teaspoon -- Pharyngeal -- Pharyngeal- Thin Cup WFL;Delayed swallow initiation-pyriform sinuses Pharyngeal Material does not enter airway Pharyngeal- Thin Straw WFL;Delayed swallow initiation-pyriform sinuses Pharyngeal Material does not enter airway Pharyngeal- Puree Delayed swallow initiation-vallecula Pharyngeal -- Pharyngeal- Mechanical Soft Delayed swallow initiation-vallecula Pharyngeal -- Pharyngeal- Regular --  Pharyngeal -- Pharyngeal- Multi-consistency -- Pharyngeal -- Pharyngeal- Pill -- Pharyngeal -- Pharyngeal Comment --  CHL IP CERVICAL ESOPHAGEAL PHASE 10/23/2019 Cervical Esophageal Phase WFL Pudding Teaspoon -- Pudding Cup -- Honey Teaspoon -- Honey Cup -- Nectar Teaspoon -- Nectar Cup -- Nectar Straw -- Thin Teaspoon -- Thin Cup -- Thin Straw -- Puree -- Mechanical Soft -- Regular -- Multi-consistency -- Pill -- Cervical Esophageal Comment -- [name] 11/06/2019, 2:41 PM                   Scheduled Meds:  amantadine  100 mg Oral BID   atorvastatin  40 mg Oral Daily   carvedilol  25 mg Oral BID WC   chlorhexidine  15 mL Mouth Rinse BID   docusate  100 mg Oral BID   enoxaparin (LOVENOX) injection  40 mg Subcutaneous Q24H   hydrALAZINE  25 mg Oral TID   insulin aspart  0-9 Units Subcutaneous TID WC   insulin aspart  4 Units Subcutaneous TID WC   insulin detemir  13 Units Subcutaneous BID   mouth rinse  15 mL Mouth Rinse q12n4p   pantoprazole  40 mg Oral QHS   polyethylene glycol  17 g Oral Daily   senna-docusate  1 tablet Oral BID   Continuous Infusions:  sodium chloride       LOS: 23 days   Time spent= 15 mins    Lyndie Vanderloop Joline Maxcy, MD [facility]  If 7PM-7AM, please contact night-coverage  11/07/2019, 10:12 AM

## 2019-11-08 ENCOUNTER — Other Ambulatory Visit: Payer: Self-pay | Admitting: Internal Medicine

## 2019-11-08 DIAGNOSIS — E1169 Type 2 diabetes mellitus with other specified complication: Secondary | ICD-10-CM | POA: Diagnosis not present

## 2019-11-08 DIAGNOSIS — E669 Obesity, unspecified: Secondary | ICD-10-CM | POA: Diagnosis not present

## 2019-11-08 DIAGNOSIS — I610 Nontraumatic intracerebral hemorrhage in hemisphere, subcortical: Secondary | ICD-10-CM | POA: Diagnosis not present

## 2019-11-08 DIAGNOSIS — J9601 Acute respiratory failure with hypoxia: Secondary | ICD-10-CM | POA: Diagnosis not present

## 2019-11-08 LAB — GLUCOSE, CAPILLARY
Glucose-Capillary: 87 mg/dL (ref 70–99)
Glucose-Capillary: 109 mg/dL — ABNORMAL HIGH (ref 70–99)
Glucose-Capillary: 77 mg/dL (ref 70–99)

## 2019-11-08 LAB — BASIC METABOLIC PANEL
Anion gap: 10 (ref 5–15)
BUN: 12 mg/dL (ref 6–20)
CO2: 23 mmol/L (ref 22–32)
Calcium: 8.9 mg/dL (ref 8.9–10.3)
Chloride: 107 mmol/L (ref 98–111)
Creatinine, Ser: 0.78 mg/dL (ref 0.44–1.00)
GFR calc Af Amer: >60 mL/min (ref >60)
GFR calc non Af Amer: >60 mL/min (ref >60)
Glucose, Bld: 96 mg/dL (ref 70–99)
Potassium: 3.3 mmol/L — ABNORMAL LOW (ref 3.5–5.1)
Sodium: 140 mmol/L (ref 135–145)

## 2019-11-08 LAB — MAGNESIUM: Magnesium: 1.9 mg/dL (ref 1.7–2.4)

## 2019-11-08 MED ORDER — POTASSIUM CHLORIDE CRYS ER 20 MEQ PO TBCR
40.0000 meq | EXTENDED_RELEASE_TABLET | Freq: Every day | ORAL | Status: AC
  Administered 2019-11-08 – 2019-11-10 (×3): 40 meq via ORAL
  Filled 2019-11-08 (×3): qty 2

## 2019-11-08 NOTE — Plan of Care (Signed)
  Problem: Coping: Goal: Will verbalize positive feelings about self Outcome: Progressing   Problem: Self-Care: Goal: Ability to communicate needs accurately will improve Outcome: Progressing   

## 2019-11-08 NOTE — Progress Notes (Signed)
PROGRESS NOTE    Latoya Nottinghamureka Cox  UEA:[id number] DOB: 09/04/1965 DOA: 10/15/2019 PCP: Myrlene Brokerrawford, Elizabeth A, MD   Brief Narrative:  54 y.o.femalewith DM, HTN and arthritis,[name] and depressed level of consciousness with garbled speech at home, after complaining of a headacheearlier in the day. On EMS arrival, they notedright facial droop.She vomited en route and continued to vomit intermittently on arrival to the ED. STAT CT head was obtained, revealing an ICH originating from the left basal ganglila, extending into the left lateral, third and 4th ventricles, with a small amount of blood in the right lateral ventricle as well. Early stages of hydrocephalus were also noted on CT. Patient admitted to ICU by Neuro.Seen by NSU-EVD placed. Remained in ICU on vent support , subsequently weaned and [facility] ( 6/27) with concerns for Hypernatremia 154, AKI 42/1.1 and hyperkalemia - resolved.  PT recommended SNF placement, currently awaiting placement.    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  MRI head was not performed due to history of previous aneurysm clips.   CTA of the head on 6/19 did not reveal any recurrent aneurysm.  S/p EVD while in ICU with improvement.  CT head on June 24 showed interval improvement. Repeat CT head due to lethargy since 11/6-negative for any acute pathology.  Now resolved. PT OT-SNF, still awaiting placement  Mild dehydration with dark urine,  improving -Normal saline 75 cc/h, 1 more day  Obstructive hydrocephalus:  Seen by neurosurgery and underwent EVD.  CT head on June 24 as well as 7/5 showed interval improvement of thalamic hemorrhage and resolution of IVH.  There was no evidence of hydrocephalus. EVD self removed 6/24, no acute issues and remained stable.   Acute hypoxic respiratory failure: Resolved  Hypernatremia, hypokalemia:   Hyponatremia resolved.  Intermittent potassium repletion  Essential Hypertension- Currently  Coreg, hydralazine.  Clonidine discontinued.   Dysphagia: Currently on dysphagia 2 diet, speech and swallow therapy following.  Diabetes mellitus type II, uncontrolled due to hyperglycemia Hemoglobin A1c 7.7, resume outpatient meds at discharge. Continue Levemir 13 units twice daily, NovoLog 4 units 3 times daily AC, sliding scale insulin  PSVT Replete electrolytes as appropriate.  Continue Coreg  Hyperlipidemia: LDL 186, goal <70, Continue statin-Lipitor 40 mg daily     DVT prophylaxis: enoxaparin (LOVENOX) injection 40 mg Start: 10/19/19 0900 Place and maintain sequential compression device Start: 10/16/19 1205  Family Communication:  None at bedside  Status is: Inpatient  Remains inpatient appropriate because:Unsafe d/c plan   Dispo: The patient is from: SNF              Anticipated d/c is to: SNF              Anticipated d/c date is: 1 day              Patient currently is medically stable to d/c.  Currently awaiting SNF bed PT/OT evaluation performed.  SNF recommended.  SNF appropriate as the patient has received  3 days of hospital care (or the 3 days stay Has been made by the patient's insurance company) and is felt to need rehab services to restore this patient to their prior level of function to achieve safe transition back to home care.  This patient needs rehab services for at least 5 days/week and skilled nursing services daily to facilitate this transition.  Rehab is been  requested as the most appropriate discharge option for this patient and is not felt to be custodial care.  Body mass index is 29.64 kg/m.    Subjective: Feels okay no complaints  Review of Systems Otherwise negative except as per HPI, including: General: Denies fever, chills, night sweats or unintended weight loss. Resp: Denies cough, wheezing, shortness of breath. Cardiac: Denies chest pain, palpitations, orthopnea, paroxysmal nocturnal dyspnea. GI: Denies abdominal pain, nausea, vomiting, diarrhea or constipation GU: Denies dysuria, frequency, hesitancy or incontinence MS: Denies muscle aches, joint pain or swelling Neuro: Denies headache, neurologic deficits (focal weakness, numbness, tingling), abnormal gait Psych: Denies anxiety, depression, SI/HI/AVH Skin: Denies new rashes or lesions ID: Denies sick contacts, exotic exposures, travel  Examination: Constitutional: Not in acute distress Respiratory: Clear to auscultation bilaterally Cardiovascular: Normal sinus rhythm, no rubs Abdomen: Nontender nondistended good bowel sounds Musculoskeletal: No edema noted Skin: No rashes seen Neurologic: CN 2-12 grossly intact.  And nonfocal Psychiatric: Normal judgment and insight. Alert and oriented x 3. Normal mood.   Objective: Vitals:   11/07/19 2346 11/08/19 0414 11/08/19 0500 11/08/19 0733  BP: (!) 160/92 (!) 154/89  (!) 143/102  Pulse: 85 77  82  Resp: 17 18  16   Temp: 98.8 F (37.1 C) 99 F (37.2 C)  98.9 F (37.2 C)  TempSrc: Oral Tympanic  Oral  SpO2: 94% 99%  100%  Weight:   73.5 kg   Height:        Intake/Output Summary (Last 24 hours) at 11/08/2019 1025 Last data filed at 11/07/2019 1200 Gross per 24 hour  Intake 47.53 ml  Output --  Net 47.53 ml   Filed Weights   11/06/19 0500 11/07/19 0500 11/08/19 0500  Weight: 73.8 kg 74.4 kg 73.5 kg     Data Reviewed:   CBC: Recent Labs  Lab 11/02/19 0631  WBC 10.7*  HGB 11.4*  HCT 36.4  MCV 85.0  PLT 266    Basic Metabolic Panel: Recent Labs  Lab 11/04/19 0457 11/05/19 0407 11/06/19 0501 11/07/19 0424 11/08/19 0409  NA 139 139 139 142 140  K 3.2* 3.4* 3.7 3.9 3.3*  CL 102 102 105 110 107  CO2 26 26 25 22 23   GLUCOSE 115* 145* 156* 102* 96  BUN 7 7 13 18 12   CREATININE 0.75 0.78 0.92 1.00 0.78  CALCIUM 8.9 9.0 9.0 9.2 8.9  MG  --  1.9 1.9 2.0 1.9   GFR: Estimated Creatinine Clearance: 77.3 mL/min (by C-G formula based on SCr of 0.78 mg/dL). Liver Function Tests: No results for input(s): AST, ALT, ALKPHOS, BILITOT, PROT, ALBUMIN in the last 168 hours. No results for input(s): LIPASE, AMYLASE in the last 168 hours. No results for input(s): AMMONIA in the last 168 hours. Coagulation Profile: No results for input(s): INR, PROTIME in the last 168 hours. Cardiac Enzymes: No results for input(s): CKTOTAL, CKMB, CKMBINDEX, TROPONINI in the last 168 hours. BNP (last 3 results) No results for input(s): PROBNP in the last 8760 hours. HbA1C: No results for input(s): HGBA1C in the last 72 hours. CBG: Recent Labs  Lab 11/07/19  6720 11/07/19 1110 11/07/19 1502 11/07/19 2130 11/08/19 0621  GLUCAP 127* 106* 126* 88 87   Lipid Profile: No results for input(s): CHOL, HDL, LDLCALC, TRIG, CHOLHDL, LDLDIRECT in the last 72 hours. Thyroid Function Tests: No results for input(s): TSH, T4TOTAL, FREET4, T3FREE, THYROIDAB in the last 72 hours. Anemia Panel: No results for input(s): VITAMINB12, FOLATE, FERRITIN, TIBC, IRON, RETICCTPCT in the last 72 hours. Sepsis Labs: No results for input(s): PROCALCITON, LATICACIDVEN in the last 168 hours.  No results found for this or any previous visit (from the past 240 hour(s)).       Radiology Studies: DG Swallowing Func-Speech Pathology  Result Date: 11/06/2019 Objective Swallowing Evaluation: Type of Study: MBS-Modified Barium Swallow Study  Patient Details Name: Najmo Pardue MRN: [id number] Date of Birth: 05-Mar-1966 Today's Date: 11/06/2019 Time:  SLP Start Time (ACUTE ONLY): 1335 -SLP Stop Time (ACUTE ONLY): 1400 SLP Time Calculation (min) (ACUTE ONLY): 25 min Past Medical History: Past Medical History: Diagnosis Date . Arthritis  . Chest wall pain  . Diabetes mellitus without complication (HCC)  . Hypertension  Past Surgical History: Past Surgical History: Procedure Laterality Date . aneurism repair   . lapband   HPI: Pt with acute headache and slurred speech, brought by EMS to hospital. CT head was obtained, revealing an ICH originating from the left basal ganglila, extending into the left lateral, third and 4th ventricles, with a small amount of blood in the right lateral ventricle as well. Early stages of hydrocephalus were also noted on CT. Acute hypoxia from respiratory failure, requiring intubation on 10/16/19. Extubated 10/21/19.  Subjective: alert, cooperative, needs cues Assessment / Plan / Recommendation CHL IP CLINICAL IMPRESSIONS 11/06/2019 Clinical Impression Pt presents with much improved oropharyngeal swallow function - she continues to have some persisting difficulty with oral manipulation/cohesion of mechanical solids, but once she propels them into pharynx, they pass swiftly through UES with no residue.  Thin liquids fill the pyriform sinuses prior to initiation of the pharyngeal swallow, however there is reliable laryngeal vestibule closure with no penetration nor aspiration.  Recommend continuing a dysphagia 2 diet for now; advance liquids to thin.  Give meds whole in puree. Pt was pleased with progress.  SLP Visit Diagnosis Dysphagia, oral phase (R13.11) Attention and concentration deficit following -- Frontal lobe and executive function deficit following -- Impact on safety and function --   CHL IP TREATMENT RECOMMENDATION 11/06/2019 Treatment Recommendations Therapy as outlined in treatment plan below   Prognosis 11/06/2019 Prognosis for Safe Diet Advancement Good Barriers to Reach Goals -- Barriers/Prognosis Comment -- CHL IP DIET  RECOMMENDATION 11/06/2019 SLP Diet Recommendations Dysphagia 2 (Fine chop) solids;Thin liquid Liquid Administration via Cup;Straw Medication Administration Whole meds with puree Compensations Minimize environmental distractions Postural Changes --   CHL IP OTHER RECOMMENDATIONS 11/06/2019 Recommended Consults -- Oral Care Recommendations Oral care BID Other Recommendations --   CHL IP FOLLOW UP RECOMMENDATIONS 11/06/2019 Follow up Recommendations Skilled Nursing facility   [facility] IP FREQUENCY AND DURATION 11/06/2019 Speech Therapy Frequency (ACUTE ONLY) min 2x/week Treatment Duration --      CHL IP ORAL PHASE 11/06/2019 Oral Phase Impaired Oral - Pudding Teaspoon -- Oral - Pudding Cup -- Oral - Honey Teaspoon -- Oral - Honey Cup -- Oral - Nectar Teaspoon -- Oral - Nectar Cup NT Oral - Nectar Straw NT Oral - Thin Teaspoon -- Oral - Thin Cup WFL Oral - Thin Straw WFL Oral - Puree WFL Oral - Mech Soft Delayed oral transit Oral - Regular --  Oral - Multi-Consistency -- Oral - Pill -- Oral Phase - Comment --  CHL IP PHARYNGEAL PHASE 11/06/2019 Pharyngeal Phase WFL Pharyngeal- Pudding Teaspoon -- Pharyngeal -- Pharyngeal- Pudding Cup -- Pharyngeal -- Pharyngeal- Honey Teaspoon -- Pharyngeal -- Pharyngeal- Honey Cup -- Pharyngeal -- Pharyngeal- Nectar Teaspoon -- Pharyngeal -- Pharyngeal- Nectar Cup NT Pharyngeal -- Pharyngeal- Nectar Straw NT Pharyngeal -- Pharyngeal- Thin Teaspoon -- Pharyngeal -- Pharyngeal- Thin Cup WFL;Delayed swallow initiation-pyriform sinuses Pharyngeal Material does not enter airway Pharyngeal- Thin Straw WFL;Delayed swallow initiation-pyriform sinuses Pharyngeal Material does not enter airway Pharyngeal- Puree Delayed swallow initiation-vallecula Pharyngeal -- Pharyngeal- Mechanical Soft Delayed swallow initiation-vallecula Pharyngeal -- Pharyngeal- Regular -- Pharyngeal -- Pharyngeal- Multi-consistency -- Pharyngeal -- Pharyngeal- Pill -- Pharyngeal -- Pharyngeal Comment --  CHL IP CERVICAL ESOPHAGEAL PHASE  10/23/2019 Cervical Esophageal Phase WFL Pudding Teaspoon -- Pudding Cup -- Honey Teaspoon -- Honey Cup -- Nectar Teaspoon -- Nectar Cup -- Nectar Straw -- Thin Teaspoon -- Thin Cup -- Thin Straw -- Puree -- Mechanical Soft -- Regular -- Multi-consistency -- Pill -- Cervical Esophageal Comment -- [name] 11/06/2019, 2:41 PM                   Scheduled Meds: . amantadine  100 mg Oral BID  . atorvastatin  40 mg Oral Daily  . carvedilol  25 mg Oral BID WC  . chlorhexidine  15 mL Mouth Rinse BID  . docusate  100 mg Oral BID  . enoxaparin (LOVENOX) injection  40 mg Subcutaneous Q24H  . hydrALAZINE  25 mg Oral TID  . insulin aspart  0-9 Units Subcutaneous TID WC  . insulin aspart  4 Units Subcutaneous TID WC  . insulin detemir  13 Units Subcutaneous BID  . mouth rinse  15 mL Mouth Rinse q12n4p  . pantoprazole  40 mg Oral QHS  . polyethylene glycol  17 g Oral Daily  . potassium chloride  40 mEq Oral Daily  . senna-docusate  1 tablet Oral BID   Continuous Infusions: . sodium chloride 75 mL/hr at 11/07/19 1200     LOS: 24 days   Time spent= 15 mins    Patricie Geeslin Joline Maxcy, MD [facility]  If 7PM-7AM, please contact night-coverage  11/08/2019, 10:25 AM

## 2019-11-09 DIAGNOSIS — E1169 Type 2 diabetes mellitus with other specified complication: Secondary | ICD-10-CM | POA: Diagnosis not present

## 2019-11-09 DIAGNOSIS — J9601 Acute respiratory failure with hypoxia: Secondary | ICD-10-CM | POA: Diagnosis not present

## 2019-11-09 DIAGNOSIS — I610 Nontraumatic intracerebral hemorrhage in hemisphere, subcortical: Secondary | ICD-10-CM | POA: Diagnosis not present

## 2019-11-09 DIAGNOSIS — I10 Essential (primary) hypertension: Secondary | ICD-10-CM | POA: Diagnosis not present

## 2019-11-09 LAB — GLUCOSE, CAPILLARY
Glucose-Capillary: 93 mg/dL (ref 70–99)
Glucose-Capillary: 159 mg/dL — ABNORMAL HIGH (ref 70–99)
Glucose-Capillary: 182 mg/dL — ABNORMAL HIGH (ref 70–99)
Glucose-Capillary: 133 mg/dL — ABNORMAL HIGH (ref 70–99)

## 2019-11-09 LAB — BASIC METABOLIC PANEL
Anion gap: 8 (ref 5–15)
BUN: 8 mg/dL (ref 6–20)
CO2: 24 mmol/L (ref 22–32)
Calcium: 8.6 mg/dL — ABNORMAL LOW (ref 8.9–10.3)
Chloride: 108 mmol/L (ref 98–111)
Creatinine, Ser: 0.86 mg/dL (ref 0.44–1.00)
GFR calc Af Amer: >60 mL/min (ref >60)
GFR calc non Af Amer: >60 mL/min (ref >60)
Glucose, Bld: 98 mg/dL (ref 70–99)
Potassium: 3.6 mmol/L (ref 3.5–5.1)
Sodium: 140 mmol/L (ref 135–145)

## 2019-11-09 LAB — MAGNESIUM: Magnesium: 1.7 mg/dL (ref 1.7–2.4)

## 2019-11-09 MED ORDER — MAGNESIUM SULFATE 2 GM/50ML IV SOLN
2.0000 g | Freq: Once | INTRAVENOUS | Status: AC
  Administered 2019-11-09: 2 g via INTRAVENOUS
  Filled 2019-11-09: qty 50

## 2019-11-09 NOTE — Progress Notes (Signed)
PROGRESS NOTE    Latoya Cox  MBW:[id number] DOB: August 14, 1965 DOA: 10/15/2019 PCP: Myrlene Broker, MD   Brief Narrative:  54 y.o. female with DM, HTN and arthritis, [name] and depressed level of consciousness with garbled speech at home, after complaining of a headache earlier in the day. On EMS arrival, they noted right facial droop. She vomited en route and continued to vomit intermittently on arrival to the ED. STAT CT head was obtained, revealing an ICH originating from the left basal ganglila, extending into the left lateral, third and 4th ventricles, with a small amount of blood in the right lateral ventricle as well. Early stages of hydrocephalus were also noted on CT. Patient admitted to ICU by Neuro.Seen by NSU-EVD placed. Remained in ICU on vent support , subsequently weaned and transferred out to [facility] on Hosp day 9 ( 6/27) with concerns for Hypernatremia 154, AKI 42/1.1 and hyperkalemia - resolved.  PT recommended SNF placement, currently awaiting placement.  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  MRI head was not performed due to history of previous aneurysm clips.   CTA of the head on 6/19 did not reveal any recurrent aneurysm.  S/p EVD while in ICU with improvement.  CT head on June 24 showed interval improvement. Repeat CT head due to lethargy since 11/6-negative for any acute pathology.  Now resolved. PT OT-SNF, awaiting placement  Mild dehydration with dark  urine, improving -Received IV fluids, encourage oral hydration   Obstructive hydrocephalus:  Seen by neurosurgery and underwent EVD.  CT head on June 24 as well as 7/5 showed interval improvement of thalamic hemorrhage and resolution of IVH.  There was no evidence of hydrocephalus. EVD self removed 6/24, no acute issues and remained stable.    Acute hypoxic respiratory failure: Resolved   Hypomagnesemia hypokalemia:   Intermittent repletion as needed  Essential Hypertension- Currently  Coreg, hydralazine.  Clonidine discontinued.    Dysphagia: Currently on dysphagia 2 diet, speech and swallow therapy following.   Diabetes mellitus type II, uncontrolled due to hyperglycemia Hemoglobin A1c 7.7, resume outpatient meds at discharge. Continue Levemir 13 units twice daily, NovoLog 4 units 3 times daily AC, sliding scale insulin   PSVT Replete electrolytes as appropriate.  Continue Coreg   Hyperlipidemia: LDL 186, goal <70, Continue statin-Lipitor 40 mg daily     DVT prophylaxis: enoxaparin (LOVENOX) injection 40 mg Start: 10/19/19 0900 Place and maintain sequential compression device Start: 10/16/19 1205  Family Communication:  None at bedside  Status is: Inpatient  Remains inpatient appropriate because:Unsafe d/c plan   Dispo: The patient is from: SNF              Anticipated d/c is to: SNF              Anticipated d/c date is: 1 day              Patient currently is medically stable to d/c.  Currently awaiting SNF bed PT/OT evaluation  performed.  SNF recommended.  SNF appropriate as the patient has received 3 days of hospital care (or the 3 days stay Has been made by the patient's insurance company) and is felt to need rehab services to restore this patient to their prior level of function to achieve safe transition back to home care.  This patient needs rehab services for at least 5 days/week and skilled nursing services daily to facilitate this transition.  Rehab is been  requested as the most appropriate discharge option for this patient and is not felt to be custodial care.  Body mass index is 29.44 kg/m.    Subjective: Feels okay no complaints.  Review of Systems Otherwise negative except as per HPI, including: General: Denies fever, chills, night sweats or unintended weight loss. Resp: Denies cough, wheezing, shortness of breath. Cardiac: Denies chest pain, palpitations, orthopnea, paroxysmal nocturnal dyspnea. GI: Denies abdominal pain, nausea, vomiting, diarrhea or constipation GU: Denies dysuria, frequency, hesitancy or incontinence MS: Denies muscle aches, joint pain or swelling Neuro: Denies headache, neurologic deficits (focal weakness, numbness, tingling), abnormal gait Psych: Denies anxiety, depression, SI/HI/AVH Skin: Denies new rashes or lesions ID: Denies sick contacts, exotic exposures, travel  Examination: Constitutional: Not in acute distress Respiratory: Clear to auscultation bilaterally Cardiovascular: Normal sinus rhythm, no rubs Abdomen: Nontender nondistended good bowel sounds Musculoskeletal: No edema noted Skin: No rashes seen Neurologic: CN 2-12 grossly intact.  And nonfocal Psychiatric: Normal judgment and insight. Alert and oriented x 3. Normal mood.   Objective: Vitals:   11/08/19 2328 11/09/19 0350 11/09/19 0500 11/09/19 0826  BP: (!) 162/103 (!) 139/103  (!) 155/96  Pulse: 82 81  81  Resp:  11  20  Temp:  98.4 F (36.9 C)  98.9 F (37.2 C)  TempSrc:  Oral  Oral  SpO2:  100%  99%  Weight:   73 kg   Height:        Intake/Output Summary (Last 24 hours) at 11/09/2019 1029 Last data filed at 11/09/2019 0351 Gross per 24 hour  Intake --  Output 950 ml  Net -950 ml   Filed Weights   11/07/19 0500 11/08/19 0500 11/09/19 0500  Weight: 74.4 kg 73.5 kg 73 kg     Data Reviewed:   CBC: No results for input(s): WBC, NEUTROABS, HGB, HCT, MCV, PLT in the last 168 hours.  Basic Metabolic Panel: Recent Labs   Lab 11/05/19 0407 11/06/19 0501 11/07/19 0424 11/08/19 0409 11/09/19 0420  NA 139 139 142 140 140  K 3.4* 3.7 3.9 3.3* 3.6  CL 102 105 110 107 108  CO2 26 25 22 23 24   GLUCOSE 145* 156* 102* 96 98  BUN 7 13 18 12 8   CREATININE 0.78 0.92 1.00 0.78 0.86  CALCIUM 9.0 9.0 9.2 8.9 8.6*  MG 1.9 1.9 2.0 1.9 1.7   GFR: Estimated Creatinine Clearance: 71.6 mL/min (by C-G formula based on SCr of 0.86 mg/dL). Liver Function Tests: No results for input(s): AST, ALT, ALKPHOS, BILITOT, PROT, ALBUMIN in the last 168 hours. No results for input(s): LIPASE, AMYLASE in the last 168 hours. No results for input(s): AMMONIA in the last 168 hours. Coagulation Profile: No results for input(s): INR, PROTIME in the last 168 hours. Cardiac Enzymes: No results for input(s): CKTOTAL, CKMB, CKMBINDEX, TROPONINI in the last 168 hours. BNP (last 3 results) No results for input(s): PROBNP in the last 8760 hours. HbA1C: No results for input(s): HGBA1C in the last 72 hours. CBG: Recent Labs  Lab 11/07/19  2130 11/08/19 0621 11/08/19 1122 11/08/19 1504 11/09/19 0602  GLUCAP 88 87 109* 77 93   Lipid Profile: No results for input(s): CHOL, HDL, LDLCALC, TRIG, CHOLHDL, LDLDIRECT in the last 72 hours. Thyroid Function Tests: No results for input(s): TSH, T4TOTAL, FREET4, T3FREE, THYROIDAB in the last 72 hours. Anemia Panel: No results for input(s): VITAMINB12, FOLATE, FERRITIN, TIBC, IRON, RETICCTPCT in the last 72 hours. Sepsis Labs: No results for input(s): PROCALCITON, LATICACIDVEN in the last 168 hours.  No results found for this or any previous visit (from the past 240 hour(s)).       Radiology Studies: No results found.       Scheduled Meds:  amantadine  100 mg Oral BID   atorvastatin  40 mg Oral Daily   carvedilol  25 mg Oral BID WC   chlorhexidine  15 mL Mouth Rinse BID   docusate  100 mg Oral BID   enoxaparin (LOVENOX) injection  40 mg Subcutaneous Q24H   hydrALAZINE  25 mg  Oral TID   insulin aspart  0-9 Units Subcutaneous TID WC   insulin aspart  4 Units Subcutaneous TID WC   insulin detemir  13 Units Subcutaneous BID   mouth rinse  15 mL Mouth Rinse q12n4p   pantoprazole  40 mg Oral QHS   polyethylene glycol  17 g Oral Daily   potassium chloride  40 mEq Oral Daily   senna-docusate  1 tablet Oral BID   Continuous Infusions:      LOS: 25 days   Time spent= 15 mins    Latoya Leavens Joline Maxcy, MD [facility]  If 7PM-7AM, please contact night-coverage  11/09/2019, 10:29 AM

## 2019-11-09 NOTE — Progress Notes (Signed)
OT Cancellation Note  Patient Details Name: Latoya Cox MRN: [id number] DOB: Aug 23, 1965   Cancelled Treatment:    Reason Eval/Treat Not Completed: Pain limiting ability to participate;Other (comment) Pt reports stomach pain this afternoon declining OT session. Will check back as time allows.  Audery Amel., COTA/L Acute Rehabilitation Services [phone]   Angelina Pih 11/09/2019, 1:43 PM

## 2019-11-10 DIAGNOSIS — J9601 Acute respiratory failure with hypoxia: Secondary | ICD-10-CM | POA: Diagnosis not present

## 2019-11-10 DIAGNOSIS — I10 Essential (primary) hypertension: Secondary | ICD-10-CM | POA: Diagnosis not present

## 2019-11-10 DIAGNOSIS — E1169 Type 2 diabetes mellitus with other specified complication: Secondary | ICD-10-CM | POA: Diagnosis not present

## 2019-11-10 DIAGNOSIS — R4182 Altered mental status, unspecified: Secondary | ICD-10-CM | POA: Diagnosis not present

## 2019-11-10 LAB — GLUCOSE, CAPILLARY
Glucose-Capillary: 108 mg/dL — ABNORMAL HIGH (ref 70–99)
Glucose-Capillary: 189 mg/dL — ABNORMAL HIGH (ref 70–99)
Glucose-Capillary: 121 mg/dL — ABNORMAL HIGH (ref 70–99)
Glucose-Capillary: 116 mg/dL — ABNORMAL HIGH (ref 70–99)

## 2019-11-10 MED ORDER — ENSURE ENLIVE PO LIQD
237.0000 mL | Freq: Three times a day (TID) | ORAL | Status: DC
  Administered 2019-11-10 – 2019-11-23 (×34): 237 mL via ORAL

## 2019-11-10 NOTE — Progress Notes (Signed)
  Speech Language Pathology Treatment: Dysphagia  Patient Details Name: Latoya Cox MRN: [id number] DOB: 1966/02/07 Today's Date: 11/10/2019 Time: 4098-1191 SLP Time Calculation (min) (ACUTE ONLY): 11 min  Assessment / Plan / Recommendation Clinical Impression  Pt was seen for dysphagia treatment with her husband present Pt, nursing, and her husband reported that the pt has been tolerating the current diet without overt s/sx of aspiration. No s/sx of aspiration were noted with puree solids, regular texture solids, or thin liquids via straw. Mastication was mildly prolonged but functional and no significant oral residue was noted. It is recommended that her diet be advanced to regular texture solids and thin liquids. SLP will follow to ensure tolerance of the recommended diet.    HPI HPI: Pt with acute headache and slurred speech, brought by EMS to hospital. CT head was obtained, revealing an ICH originating from the left basal ganglila, extending into the left lateral, third and 4th ventricles, with a small amount of blood in the right lateral ventricle as well. Early stages of hydrocephalus were also noted on CT. Acute hypoxia from respiratory failure, requiring intubation on 10/16/19. Extubated 10/21/19.      SLP Plan  Continue with current plan of care       Recommendations  Diet recommendations: Regular;Thin liquid Liquids provided via: Cup;Straw Medication Administration: Whole meds with puree Supervision: Staff to assist with self feeding;Full supervision/cueing for compensatory strategies Compensations: Minimize environmental distractions Postural Changes and/or Swallow Maneuvers: Seated upright 90 degrees                Oral Care Recommendations: Oral care BID Follow up Recommendations: Skilled Nursing facility SLP Visit Diagnosis: Dysphagia, unspecified (R13.10) Plan: Continue with current plan of care       Khylie Larmore I. Vear Clock, MS, CCC-SLP Acute Rehabilitation  Services Office number [phone] Pager [phone]                Scheryl Marten 11/10/2019, 1:17 PM

## 2019-11-10 NOTE — Progress Notes (Signed)
Nutrition Follow-up  DOCUMENTATION CODES:   Obesity unspecified  INTERVENTION:  Continue Magic cup TID with meals, each supplement provides 290 kcal and 9 grams of protein  D/c Vital Cuisine shake  Ensure Enlive po TID, each supplement provides 350 kcal and 20 grams of protein  NUTRITION DIAGNOSIS:   Inadequate oral intake related to dysphagia as evidenced by NPO status.  Progressing, pt now on Dysphagia 2 diet with thin liquids  GOAL:   Patient will meet greater than or equal to 90% of their needs  Progressing  MONITOR:   Diet advancement, Labs, Weight trends, Skin, I & O's  REASON FOR ASSESSMENT:   Ventilator, Consult Enteral/tube feeding initiation and management  ASSESSMENT:   54 year old female who presented on 6/17 as a code stroke. PMH of DM and poorly controlled HTN. CT revealed ICH.  6/18 - worsening hydrocephalus, s/p EVD, intubated 6/23 - extubated 6/24 - EVD self removed 6/25 - s/p MBS, Dysphagia 2 with nectar thick liquids 7/9 - s/p MBS, diet advanced to dysphagia 2 with thin liquids  Pt states appetite is "okay." Pt reports sometimes consuming Magic Cup and would like to continue receiving these. Pt unsure if she has been drinking the Vital Cuisine shakes. Discussed discontinuing these and ordering Ensure now that pt can have thin liquids. Pt agreeable with plan.   PO Intake: 0-75% x last 8 recorded meals (43% average meal intake)  Labs: CBGs 108-182 Medications: Colace, Novolog, Levemir, Protonix, Miralax, Klor-con, Senokot-S  Diet Order:   Diet Order            DIET DYS 2 Room service appropriate? Yes; Fluid consistency: Thin  Diet effective now                 EDUCATION NEEDS:   No education needs have been identified at this time  Skin:  Skin Assessment: Skin Integrity Issues: Skin Integrity Issues:: Incisions, Stage II Stage II: buttocks Incisions: L head  Last BM:  7/11  Height:   Ht Readings from Last 1 Encounters:   10/25/19 5\' 2"  (1.575 m)    Weight:   Wt Readings from Last 1 Encounters:  11/10/19 73.5 kg    Ideal Body Weight:  50 kg  BMI:  Body mass index is 29.64 kg/m.  Estimated Nutritional Needs:   Kcal:  1800-2000  Protein:  90-110 grams  Fluid:  >/= 1.8 L    11/12/19, MS, RD, LDN RD pager number and weekend/on-call pager number located in [place].

## 2019-11-10 NOTE — Progress Notes (Signed)
Physical Therapy Treatment Patient Details Name: Latoya Cox MRN: [id number] DOB: 1965-10-18 Today's Date: 11/10/2019    History of Present Illness 54 y.o. female with DM, HTN and arthritis, [name] and depressed level of consciousness with garbled speech at home, after complaining of a headache earlier in the day. STAT CT head was obtained, revealing an ICH originating from the left basal ganglila, extending into the left lateral, third and 4th ventricles, with a small amount of blood in the right lateral ventricle as well. Early stages of hydrocephalus were also noted on CT. Pt underwent L frontal IVC placement and intubation on 6/18. Pt extubated on 6/23.    PT Comments    Patient has improved with balance/mobility, however continues to require min assist for ambulation with RW. Her decreased safety awareness requires her to have 24/7 assistance. She had a difficult time understanding how to lightly touch hands/fingers on counter to provide less bil UE support as working on standing balance activities.     Follow Up Recommendations  SNF     Equipment Recommendations  Rolling walker with 5" wheels;3in1 (PT)    Recommendations for Other Services       Precautions / Restrictions Precautions Precautions: Fall    Mobility  Bed Mobility Overal bed mobility: Needs Assistance Bed Mobility: Supine to Sit     Supine to sit: Min guard     General bed mobility comments: pt manipulating covers to come to sit EOB; one foot caught in blanket and pt initially not recoginizing the issue and persisting on attempt to move leg over EOB  Transfers Overall transfer level: Needs assistance Equipment used: Rolling walker (2 wheeled) Transfers: Sit to/from Stand Sit to Stand: Min guard         General transfer comment: cues for hand placement and to fully finish turning prior to sitting  Ambulation/Gait Ambulation/Gait  assistance: Min assist Gait Distance (Feet): 180 Feet Assistive device: Rolling walker (2 wheeled) Gait Pattern/deviations: Step-through pattern;Narrow base of support Gait velocity: decreased   General Gait Details: mildly unsteady in the RW (especially when she lets go of walker with RUE while walking), episodes of drift R and runs into obstacles on the right at times, taking a few seconds to "untangle" herself.   Stairs             Wheelchair Mobility    Modified Rankin (Stroke Patients Only) Modified Rankin (Stroke Patients Only) Pre-Morbid Rankin Score: No symptoms Modified Rankin: Moderately severe disability     Balance Overall balance assessment: Needs assistance Sitting-balance support: No upper extremity supported;Feet supported Sitting balance-Leahy Scale: Fair     Standing balance support: Bilateral upper extremity supported;During functional activity Standing balance-Leahy Scale: Poor Standing balance comment: reliant on external support or AD, but notably improved over the last week.                            Cognition Arousal/Alertness: Awake/alert Behavior During Therapy: Flat affect Overall Cognitive Status: Impaired/Different from baseline Area of Impairment: Orientation;Attention;Following commands;Awareness;Problem solving;Memory;Safety/judgement                 Orientation Level: Time;Place;Situation Current Attention Level: Sustained;Focused Memory: Decreased recall of precautions;Decreased short-term memory Following Commands: Follows one step commands with increased time Safety/Judgement: Decreased awareness of safety;Decreased awareness of deficits Awareness: Intellectual Problem Solving: Slow processing;Decreased initiation;Difficulty sequencing;Requires verbal cues General Comments: very flat and  slow to respond; "March" "[place]"; RN reported pt climbed out of bed alone and walked into hallway earlier today       Exercises General Exercises - Lower Extremity Hip ABduction/ADduction: Strengthening;Both;20 reps;Standing (at sink; alternating legs) Heel Raises: Both;10 reps;Standing (bil UE support at sink)    General Comments        Pertinent Vitals/Pain Pain Assessment: No/denies pain    Home Living                      Prior Function            PT Goals (current goals can now be found in the care plan section) Acute Rehab PT Goals Patient Stated Goal: To improve mobility Time For Goal Achievement: 11/14/19 Potential to Achieve Goals: Fair    Frequency    Min 3X/week      PT Plan Current plan remains appropriate    Co-evaluation              AM-PAC PT "6 Clicks" Mobility   Outcome Measure  Help needed turning from your back to your side while in a flat bed without using bedrails?: None Help needed moving from lying on your back to sitting on the side of a flat bed without using bedrails?: A Little Help needed moving to and from a bed to a chair (including a wheelchair)?: A Little Help needed standing up from a chair using your arms (e.g., wheelchair or bedside chair)?: A Little Help needed to walk in hospital room?: A Little Help needed climbing 3-5 steps with a railing? : A Lot 6 Click Score: 18    End of Session Equipment Utilized During Treatment: Gait belt Activity Tolerance: Patient tolerated treatment well Patient left: in chair;with call bell/phone within reach;with chair alarm set Nurse Communication: Mobility status PT Visit Diagnosis: Other abnormalities of gait and mobility (R26.89);Other symptoms and signs involving the nervous system (R29.898)     Time: 9833-8250 PT Time Calculation (min) (ACUTE ONLY): 28 min  Charges:  $Gait Training: 8-22 mins $Therapeutic Exercise: 8-22 mins                      Jerolyn Center, PT Pager [phone]    Zena Amos 11/10/2019, 5:25 PM

## 2019-11-10 NOTE — Progress Notes (Signed)
PROGRESS NOTE    Latoya Cox  KGU:[id number] DOB: 01-02-1966 DOA: 10/15/2019 PCP: Myrlene Broker, MD   Brief Narrative:  54 y.o. female with DM, HTN and arthritis, [name] and depressed level of consciousness with garbled speech at home, after complaining of a headache earlier in the day. On EMS arrival, they noted right facial droop. She vomited en route and continued to vomit intermittently on arrival to the ED. STAT CT head was obtained, revealing an ICH originating from the left basal ganglila, extending into the left lateral, third and 4th ventricles, with a small amount of blood in the right lateral ventricle as well. Early stages of hydrocephalus were also noted on CT. Patient admitted to ICU by Neuro.Seen by NSU-EVD placed. Remained in ICU on vent support , subsequently weaned and transferred out to [facility] on Hosp day 9 ( 6/27) with concerns for Hypernatremia 154, AKI 42/1.1 and hyperkalemia - resolved.  PT recommended SNF placement, currently awaiting placement.  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)   Essential hypertension   Acute hypoxemic respiratory failure (HCC)   Altered mental status  Left thalamic intracranial, intraventricular hemorrhage:   CT head on admission showed acute ICH in the left thalamus (9.6 cc) with intraventricular penetration and associated early dilatation of the lateral ventricles.  MRI head was not performed due to history of previous aneurysm clips.   CTA of the head on 6/19 did not reveal any recurrent aneurysm.  S/p EVD while in ICU with improvement.  CT head on June 24 showed interval improvement. Repeat CT head due to lethargy since 11/6-negative for any acute pathology.  Now resolved. PT OT-SNF, medically stable awaiting  placement  Mild dehydration with dark urine, improving -Encourage oral hydration.  Intermittently requires IV fluids   Obstructive hydrocephalus:  Seen by neurosurgery and underwent EVD.  CT head on June 24 as well as 7/5 showed interval improvement of thalamic hemorrhage and resolution of IVH.  There was no evidence of hydrocephalus. EVD self removed 6/24, no acute issues and remained stable.    Acute hypoxic respiratory failure: Resolved   Hypomagnesemia hypokalemia:   Intermittent repletion as needed  Essential Hypertension- Currently  Coreg, hydralazine.  Clonidine discontinued.    Dysphagia: Currently on dysphagia 2 diet, speech and swallow therapy following.   Diabetes mellitus type II, uncontrolled due to hyperglycemia Hemoglobin A1c 7.7, resume outpatient meds at discharge. Continue Levemir 13 units twice daily, NovoLog 4 units 3 times daily AC, sliding scale insulin   PSVT Replete electrolytes as appropriate.  Continue Coreg   Hyperlipidemia: LDL 186, goal <70, Continue statin-Lipitor 40 mg daily     DVT prophylaxis: enoxaparin (LOVENOX) injection 40 mg Start: 10/19/19 0900 Place and maintain sequential compression device Start: 10/16/19 1205  Family Communication:  None at bedside  Status is: Inpatient  Remains inpatient appropriate because:Unsafe d/c plan   Dispo: The patient is from: SNF              Anticipated d/c is to: SNF              Anticipated d/c date is: 1 day              Patient currently is medically stable to d/c.  Awaiting  SNF bed PT/OT evaluation performed.  SNF recommended.  SNF appropriate as the patient has received 3 days of hospital care (or the 3 days stay Has been made by the patient's insurance company) and is felt to need rehab services to restore this patient to their prior level of function to achieve safe transition back to home care.  This patient needs rehab services for at least 5 days/week and skilled nursing services daily to  facilitate this transition.  Rehab is been requested as the most appropriate discharge option for this patient and is not felt to be custodial care.  Body mass index is 29.64 kg/m.    Subjective: No acute events overnight, no complaints this morning  Review of Systems Otherwise negative except as per HPI, including: General: Denies fever, chills, night sweats or unintended weight loss. Resp: Denies cough, wheezing, shortness of breath. Cardiac: Denies chest pain, palpitations, orthopnea, paroxysmal nocturnal dyspnea. GI: Denies abdominal pain, nausea, vomiting, diarrhea or constipation GU: Denies dysuria, frequency, hesitancy or incontinence MS: Denies muscle aches, joint pain or swelling Neuro: Denies headache, neurologic deficits (focal weakness, numbness, tingling), abnormal gait Psych: Denies anxiety, depression, SI/HI/AVH Skin: Denies new rashes or lesions ID: Denies sick contacts, exotic exposures, travel  Examination: Constitutional: Not in acute distress Respiratory: Clear to auscultation bilaterally Cardiovascular: Normal sinus rhythm, no rubs Abdomen: Nontender nondistended good bowel sounds Musculoskeletal: No edema noted Skin: No rashes seen Neurologic: CN 2-12 grossly intact.  And nonfocal Psychiatric: Normal judgment and insight. Alert and oriented x 3. Normal mood. Objective: Vitals:   11/10/19 0027 11/10/19 0027 11/10/19 0352 11/10/19 0842  BP: 132/78 132/78 (!) 115/100 (!) 150/83  Pulse: 89 89 85 83  Resp:   16 16  Temp:   98.2 F (36.8 C) 98.4 F (36.9 C)  TempSrc:   Oral Oral  SpO2:   100% 99%  Weight:   73.5 kg   Height:        Intake/Output Summary (Last 24 hours) at 11/10/2019 0859 Last data filed at 11/09/2019 1745 Gross per 24 hour  Intake 358 ml  Output 650 ml  Net -292 ml   Filed Weights   11/08/19 0500 11/09/19 0500 11/10/19 0352  Weight: 73.5 kg 73 kg 73.5 kg     Data Reviewed:   CBC: No results for input(s): WBC, NEUTROABS, HGB,  HCT, MCV, PLT in the last 168 hours.  Basic Metabolic Panel: Recent Labs  Lab 11/05/19 0407 11/06/19 0501 11/07/19 0424 11/08/19 0409 11/09/19 0420  NA 139 139 142 140 140  K 3.4* 3.7 3.9 3.3* 3.6  CL 102 105 110 107 108  CO2 26 25 22 23 24   GLUCOSE 145* 156* 102* 96 98  BUN 7 13 18 12 8   CREATININE 0.78 0.92 1.00 0.78 0.86  CALCIUM 9.0 9.0 9.2 8.9 8.6*  MG 1.9 1.9 2.0 1.9 1.7   GFR: Estimated Creatinine Clearance: 71.9 mL/min (by C-G formula based on SCr of 0.86 mg/dL). Liver Function Tests: No results for input(s): AST, ALT, ALKPHOS, BILITOT, PROT, ALBUMIN in the last 168 hours. No results for input(s): LIPASE, AMYLASE in the last 168 hours. No results for input(s): AMMONIA in the last 168 hours. Coagulation Profile: No results for input(s): INR, PROTIME in the last 168 hours. Cardiac Enzymes: No results for input(s): CKTOTAL, CKMB, CKMBINDEX, TROPONINI in the last 168 hours. BNP (last 3 results) No results for input(s): PROBNP in the last 8760 hours. HbA1C: No results for input(s): HGBA1C in the last 72 hours.  CBG: Recent Labs  Lab 11/09/19 0602 11/09/19 1118 11/09/19 1649 11/09/19 2111 11/10/19 0607  GLUCAP 93 159* 182* 133* 108*   Lipid Profile: No results for input(s): CHOL, HDL, LDLCALC, TRIG, CHOLHDL, LDLDIRECT in the last 72 hours. Thyroid Function Tests: No results for input(s): TSH, T4TOTAL, FREET4, T3FREE, THYROIDAB in the last 72 hours. Anemia Panel: No results for input(s): VITAMINB12, FOLATE, FERRITIN, TIBC, IRON, RETICCTPCT in the last 72 hours. Sepsis Labs: No results for input(s): PROCALCITON, LATICACIDVEN in the last 168 hours.  No results found for this or any previous visit (from the past 240 hour(s)).       Radiology Studies: No results found.       Scheduled Meds:  amantadine  100 mg Oral BID   atorvastatin  40 mg Oral Daily   carvedilol  25 mg Oral BID WC   chlorhexidine  15 mL Mouth Rinse BID   docusate  100 mg Oral BID    enoxaparin (LOVENOX) injection  40 mg Subcutaneous Q24H   hydrALAZINE  25 mg Oral TID   insulin aspart  0-9 Units Subcutaneous TID WC   insulin aspart  4 Units Subcutaneous TID WC   insulin detemir  13 Units Subcutaneous BID   mouth rinse  15 mL Mouth Rinse q12n4p   pantoprazole  40 mg Oral QHS   polyethylene glycol  17 g Oral Daily   potassium chloride  40 mEq Oral Daily   senna-docusate  1 tablet Oral BID   Continuous Infusions:      LOS: 26 days   Time spent= 15 mins    Latoya Cox Joline Maxcy, MD [facility]  If 7PM-7AM, please contact night-coverage  11/10/2019, 8:59 AM

## 2019-11-10 NOTE — Plan of Care (Signed)
  Problem: Education: Goal: Knowledge of disease or condition will improve Outcome: Progressing Goal: Knowledge of secondary prevention will improve Outcome: Progressing Goal: Knowledge of patient specific risk factors addressed and post discharge goals established will improve Outcome: Progressing   Problem: Coping: Goal: Will verbalize positive feelings about self Outcome: Progressing Goal: Will identify appropriate support needs Outcome: Progressing   Problem: Self-Care: Goal: Ability to participate in self-care as condition permits will improve Outcome: Progressing Goal: Verbalization of feelings and concerns over difficulty with self-care will improve Outcome: Progressing Goal: Ability to communicate needs accurately will improve Outcome: Progressing   Problem: Nutrition: Goal: Risk of aspiration will decrease Outcome: Progressing Goal: Dietary intake will improve Outcome: Progressing   Problem: Intracerebral Hemorrhage Tissue Perfusion: Goal: Complications of Intracerebral Hemorrhage will be minimized Outcome: Progressing

## 2019-11-11 DIAGNOSIS — I610 Nontraumatic intracerebral hemorrhage in hemisphere, subcortical: Secondary | ICD-10-CM | POA: Diagnosis not present

## 2019-11-11 DIAGNOSIS — R4182 Altered mental status, unspecified: Secondary | ICD-10-CM | POA: Diagnosis not present

## 2019-11-11 DIAGNOSIS — J9601 Acute respiratory failure with hypoxia: Secondary | ICD-10-CM | POA: Diagnosis not present

## 2019-11-11 LAB — BASIC METABOLIC PANEL
Anion gap: 9 (ref 5–15)
BUN: 12 mg/dL (ref 6–20)
CO2: 22 mmol/L (ref 22–32)
Calcium: 9 mg/dL (ref 8.9–10.3)
Chloride: 108 mmol/L (ref 98–111)
Creatinine, Ser: 1.04 mg/dL — ABNORMAL HIGH (ref 0.44–1.00)
GFR calc Af Amer: >60 mL/min (ref >60)
GFR calc non Af Amer: >60 mL/min (ref >60)
Glucose, Bld: 129 mg/dL — ABNORMAL HIGH (ref 70–99)
Potassium: 3.8 mmol/L (ref 3.5–5.1)
Sodium: 139 mmol/L (ref 135–145)

## 2019-11-11 LAB — CBC
HCT: 38.5 % (ref 36.0–46.0)
Hemoglobin: 11.9 g/dL — ABNORMAL LOW (ref 12.0–15.0)
MCH: 26.6 pg (ref 26.0–34.0)
MCHC: 30.9 g/dL (ref 30.0–36.0)
MCV: 85.9 fL (ref 80.0–100.0)
Platelets: 201 10*3/uL (ref 150–400)
RBC: 4.48 MIL/uL (ref 3.87–5.11)
RDW: 14.2 % (ref 11.5–15.5)
WBC: 7.4 10*3/uL (ref 4.0–10.5)
nRBC: 0 % (ref 0.0–0.2)

## 2019-11-11 LAB — GLUCOSE, CAPILLARY
Glucose-Capillary: 114 mg/dL — ABNORMAL HIGH (ref 70–99)
Glucose-Capillary: 173 mg/dL — ABNORMAL HIGH (ref 70–99)
Glucose-Capillary: 101 mg/dL — ABNORMAL HIGH (ref 70–99)
Glucose-Capillary: 154 mg/dL — ABNORMAL HIGH (ref 70–99)

## 2019-11-11 LAB — MAGNESIUM: Magnesium: 2.1 mg/dL (ref 1.7–2.4)

## 2019-11-11 NOTE — Progress Notes (Signed)
Occupational Therapy Treatment Patient Details Name: Latoya Cox MRN: [id number] DOB: 16-Feb-1966 Today's Date: 11/11/2019    History of present illness 54 y.o. female with DM, HTN and arthritis, [name] and depressed level of consciousness with garbled speech at home, after complaining of a headache earlier in the day. STAT CT head was obtained, revealing an ICH originating from the left basal ganglila, extending into the left lateral, third and 4th ventricles, with a small amount of blood in the right lateral ventricle as well. Early stages of hydrocephalus were also noted on CT. Pt underwent L frontal IVC placement and intubation on 6/18. Pt extubated on 6/23.   OT comments  Patient progressing well and updated OT goals today.  She remains with flat affect and slow processing, but noted increased engagement with therapist today, asking questions and even thanking therapist for session.  Patient completing bed mobility with supervision, transfers with min guard using RW and in room mobility with min guard to min assist (1 LOB noted when stepping backwards to bed).  Patient engaged in grooming/bathing tasks at sink, continues to require min assist for task sequencing, problem solving, but physically completing tasks with min guard assist only.  Will follow acutely, SNF remains appropriate.    Follow Up Recommendations  SNF    Equipment Recommendations  Other (comment) (TBD at next venue of care )    Recommendations for Other Services      Precautions / Restrictions Precautions Precautions: Fall Restrictions Weight Bearing Restrictions: No       Mobility Bed Mobility Overal bed mobility: Needs Assistance Bed Mobility: Supine to Sit;Sit to Supine     Supine to sit: Supervision Sit to supine: Supervision   General bed mobility comments: for safety, increased time    Transfers Overall transfer level: Needs  assistance Equipment used: Rolling walker (2 wheeled) Transfers: Sit to/from Stand Sit to Stand: Min guard         General transfer comment: for safety, cueing for hand placement     Balance Overall balance assessment: Needs assistance Sitting-balance support: No upper extremity supported;Feet supported Sitting balance-Leahy Scale: Fair Sitting balance - Comments: supervision    Standing balance support: No upper extremity supported;During functional activity;Single extremity supported;Bilateral upper extremity supported Standing balance-Leahy Scale: Poor Standing balance comment: relies on external support dynamically, engages in grooming with 0 hand support and min guard; 1 LOB stepping posteriorly to EOB                            ADL either performed or assessed with clinical judgement   ADL Overall ADL's : Needs assistance/impaired     Grooming: Wash/dry hands;Wash/dry face;Oral care;Standing;Minimal assistance Grooming Details (indicate cue type and reason): verbal cueing for task sequencing and completion, min guard for safety      Lower Body Bathing: Minimal assistance;Sit to/from stand Lower Body Bathing Details (indicate cue type and reason): assist for bathing peri area standing at sink, cueing for sequencing steps and ensuring cleanliness          Toilet Transfer: Min guard;Ambulation;RW Toilet Transfer Details (indicate cue type and reason): simulated in room         Functional mobility during ADLs: Minimal assistance;Min guard;Rolling walker;Cueing for safety General ADL Comments: pt demonstrating improving balance, activity tolerance, cognition     Vision       Perception  Praxis      Cognition Arousal/Alertness: Awake/alert Behavior During Therapy: Flat affect Overall Cognitive Status: Impaired/Different from baseline Area of Impairment: Memory;Attention;Following commands;Safety/judgement;Problem solving;Awareness                    Current Attention Level: Sustained Memory: Decreased recall of precautions;Decreased short-term memory Following Commands: Follows one step commands consistently;Follows one step commands with increased time;Follows multi-step commands inconsistently Safety/Judgement: Decreased awareness of safety;Decreased awareness of deficits Awareness: Intellectual Problem Solving: Slow processing;Decreased initiation;Difficulty sequencing;Requires verbal cues General Comments: patient remains with flat affect and slow processing, requires multimodal cueing for multistep commands, task completion and problem solving; but noted increased interaction and engagement with therapist during session, thanking OT for coming         Exercises     Shoulder Instructions       General Comments Pt with improving attention, utilizing UEs functionally and able to locate/retrieve items without cueing at sink    Pertinent Vitals/ Pain       Pain Assessment: No/denies pain  Home Living                                          Prior Functioning/Environment              Frequency  Min 2X/week        Progress Toward Goals  OT Goals(current goals can now be found in the care plan section)  Progress towards OT goals: Progressing toward goals  Acute Rehab OT Goals Patient Stated Goal: to visit with my husband today OT Goal Formulation: With patient ADL Goals Pt Will Perform Eating: with modified independence;sitting Pt Will Perform Grooming: with supervision;sitting;standing Pt Will Perform Upper Body Bathing: with set-up;with supervision;sitting Pt Will Transfer to Toilet: with supervision;ambulating Additional ADL Goal #1: Patient will demonstrate emergent awareness during ADL routine with no more than minimal cueing. Additional ADL Goal #2: Patient will follow 2-3 step commands with 90% accuracy to optimize participation in ADL routine.  Plan Discharge plan remains  appropriate;Frequency remains appropriate    Co-evaluation                 AM-PAC OT "6 Clicks" Daily Activity     Outcome Measure   Help from another person eating meals?: A Lot Help from another person taking care of personal grooming?: A Little Help from another person toileting, which includes using toliet, bedpan, or urinal?: A Lot Help from another person bathing (including washing, rinsing, drying)?: A Little Help from another person to put on and taking off regular upper body clothing?: A Little Help from another person to put on and taking off regular lower body clothing?: A Little 6 Click Score: 16    End of Session Equipment Utilized During Treatment: Gait belt;Rolling walker  OT Visit Diagnosis: Other abnormalities of gait and mobility (R26.89);Hemiplegia and hemiparesis;Other symptoms and signs involving the nervous system (R29.898) Hemiplegia - Right/Left: Right Hemiplegia - dominant/non-dominant: Dominant Hemiplegia - caused by: Nontraumatic intracerebral hemorrhage   Activity Tolerance Patient tolerated treatment well   Patient Left in bed;with call bell/phone within reach;with bed alarm set   Nurse Communication Mobility status        Time: 5573-2202 OT Time Calculation (min): 31 min  Charges: OT General Charges $OT Visit: 1 Visit OT Treatments $Self Care/Home Management : 23-37 mins  Barry Brunner, OT Acute Rehabilitation Services Pager  [phone] Office [phone]     Chancy Milroy 11/11/2019, 11:06 AM

## 2019-11-11 NOTE — Progress Notes (Signed)
Latoya Cox  PVX:[id number] DOB: [birth year] DOA: 10/15/2019 PCP: Myrlene Broker, MD    Brief Narrative:  54 year old with a history of DM, HTN, and arthritis who presented to the ED via EMS after the acute onset of right-sided weakness, altered level of consciousness, and garbled speech following the acute onset of a headache earlier in the day. EMS confirmed a right facial droop. Stat CT head revealed an intracranial hemorrhage originating from the left basal ganglia and extending into the left lateral third and fourth ventricle with a small amount of blood in the right lateral ventricle as well.  Early hydrocephalus was appreciated on CT.  She was admitted to the ICU by the neuro hospitalist team.  She was seen by neurosurgery and an EVD was placed.  She remained in the ICU for a prolonged period on ventilator support but ultimately was able to be weaned and transferred out to [facility] on hospital day #9, June 27.  Physical therapy has recommended SNF placement and a bed search is currently underway  Antimicrobials:  None  Subjective: Resting comfortably in bed with no new complaints.  Reports a good appetite and intake.  Denies chest pain shortness of breath fevers or chills.  Assessment & Plan:  Left thalamic intracranial intraventricular hemorrhage CT head at admission noted acute ICH left thalamus with intraventricular penetration and early dilation of the lateral ventricles -MRI not performed due to prior aneurysm clips -EVD placed while in ICU with improvement clinically -CT head June 24th confirmed interval improvement -PT/OT confirm need for SNF placement  Obstructive hydrocephalus Status post EVD -EVD self removed June 24 with no acute issues afterward  Acute hypoxic respiratory failure Neurologic in etiology -resolved  Hypomagnesemia Corrected with supplementation  Hypokalemia Corrected with supplementation  HTN Blood pressure well  controlled  Dysphagia SLP following -has been advanced to regular diet as of 7/13  DM2 uncontrolled with hyperglycemia A1c 7.7 -CBG presently well controlled with inpatient regimen  PSVT Continue Coreg  HLD LDL 186 with goal of less than 70 - continue Lipitor  Normocytic anemia Likely simply anemia of critical illness/poor nutrition and acute illness phase -no gross blood loss -follow trend  Mild dehydration Intermittently requiring IV fluids for  DVT prophylaxis: Lovenox started 10/19/2019 Code Status: FULL CODE Family Communication:  Status is: Inpatient  Remains inpatient appropriate because:Unsafe d/c plan   Dispo: The patient is from: Home              Anticipated d/c is to: SNF              Anticipated d/c date is: 1 day              Patient currently is medically stable to d/c.   Objective: Blood pressure 127/89, pulse 82, temperature 98.9 F (37.2 C), temperature source Oral, resp. rate 18, height 5\' 2"  (1.575 m), weight 73.6 kg, SpO2 99 %. No intake or output data in the 24 hours ending 11/11/19 0757 Filed Weights   11/09/19 0500 11/10/19 0352 11/11/19 0325  Weight: 73 kg 73.5 kg 73.6 kg    Examination: General: No acute respiratory distress Lungs: Clear to auscultation bilaterally without wheezes or crackles Cardiovascular: Regular rate and rhythm without murmur gallop or rub normal S1 and S2 Abdomen: Nontender, nondistended, soft, bowel sounds positive, no rebound, no ascites, no appreciable mass Extremities: No significant cyanosis, clubbing, or edema bilateral lower extremities  CBC: Recent Labs  Lab 11/11/19 0519  WBC 7.4  HGB 11.9*  HCT 38.5  MCV 85.9  PLT 201   Basic Metabolic Panel: Recent Labs  Lab 11/08/19 0409 11/09/19 0420 11/11/19 0519  NA 140 140 139  K 3.3* 3.6 3.8  CL 107 108 108  CO2 23 24 22   GLUCOSE 96 98 129*  BUN 12 8 12   CREATININE 0.78 0.86 1.04*  CALCIUM 8.9 8.6* 9.0  MG 1.9 1.7 2.1   GFR: Estimated  Creatinine Clearance: 59.4 mL/min (A) (by C-G formula based on SCr of 1.04 mg/dL (H)).  Liver Function Tests: No results for input(s): AST, ALT, ALKPHOS, BILITOT, PROT, ALBUMIN in the last 168 hours. No results for input(s): LIPASE, AMYLASE in the last 168 hours. No results for input(s): AMMONIA in the last 168 hours.   HbA1C: Hgb A1c MFr Bld  Date/Time Value Ref Range Status  10/15/2019 07:49 PM 9.1 (H) 4.8 - 5.6 % Final    Comment:    (NOTE)         Prediabetes: 5.7 - 6.4         Diabetes: >6.4         Glycemic control for adults with diabetes: <7.0   01/14/2019 06:43 PM 7.7 (H) 4.8 - 5.6 % Final    Comment:    (NOTE)         Prediabetes: 5.7 - 6.4         Diabetes: >6.4         Glycemic control for adults with diabetes: <7.0     CBG: Recent Labs  Lab 11/10/19 0607 11/10/19 1123 11/10/19 1601 11/10/19 2114 11/11/19 0624  GLUCAP 108* 189* 121* 116* 114*     Scheduled Meds: . amantadine  100 mg Oral BID  . atorvastatin  40 mg Oral Daily  . carvedilol  25 mg Oral BID WC  . chlorhexidine  15 mL Mouth Rinse BID  . docusate  100 mg Oral BID  . enoxaparin (LOVENOX) injection  40 mg Subcutaneous Q24H  . feeding supplement (ENSURE ENLIVE)  237 mL Oral TID BM  . hydrALAZINE  25 mg Oral TID  . insulin aspart  0-9 Units Subcutaneous TID WC  . insulin aspart  4 Units Subcutaneous TID WC  . insulin detemir  13 Units Subcutaneous BID  . mouth rinse  15 mL Mouth Rinse q12n4p  . pantoprazole  40 mg Oral QHS  . polyethylene glycol  17 g Oral Daily  . senna-docusate  1 tablet Oral BID     LOS: 27 days   2115, MD [facility] Office  [phone] Pager - Text Page per Amion  If 7PM-7AM, please contact night-coverage per Amion 11/11/2019, 7:57 AM

## 2019-11-12 DIAGNOSIS — J9601 Acute respiratory failure with hypoxia: Secondary | ICD-10-CM | POA: Diagnosis not present

## 2019-11-12 DIAGNOSIS — I610 Nontraumatic intracerebral hemorrhage in hemisphere, subcortical: Secondary | ICD-10-CM | POA: Diagnosis not present

## 2019-11-12 LAB — GLUCOSE, CAPILLARY
Glucose-Capillary: 109 mg/dL — ABNORMAL HIGH (ref 70–99)
Glucose-Capillary: 135 mg/dL — ABNORMAL HIGH (ref 70–99)
Glucose-Capillary: 192 mg/dL — ABNORMAL HIGH (ref 70–99)
Glucose-Capillary: 230 mg/dL — ABNORMAL HIGH (ref 70–99)

## 2019-11-12 NOTE — Progress Notes (Signed)
Physical Therapy Treatment Patient Details Name: Latoya Cox MRN: [id number] DOB: [birth year] Today's Date: 11/12/2019    History of Present Illness 54 y.o. female with DM, HTN and arthritis, [name] and depressed level of consciousness with garbled speech at home, after complaining of a headache earlier in the day. STAT CT head was obtained, revealing an ICH originating from the left basal ganglila, extending into the left lateral, third and 4th ventricles, with a small amount of blood in the right lateral ventricle as well. Early stages of hydrocephalus were also noted on CT. Pt underwent L frontal IVC placement and intubation on 6/18. Pt extubated on 6/23.    PT Comments    Stability with RW improving, however continues with staggering to the right without a device (requires min assist). Cognition continues to be very impaired. She is oriented x 1 only, and despite frequently telling her where she is and why, she cannot recall the answers for more than 30 seconds.     Follow Up Recommendations  SNF     Equipment Recommendations  Rolling walker with 5" wheels;3in1 (PT)    Recommendations for Other Services       Precautions / Restrictions Precautions Precautions: Fall    Mobility  Bed Mobility Overal bed mobility: Needs Assistance Bed Mobility: Supine to Sit     Supine to sit: Supervision     General bed mobility comments: for safety due to decr cognition and lethargic  Transfers Overall transfer level: Needs assistance Equipment used: Rolling walker (2 wheeled) Transfers: Sit to/from Stand Sit to Stand: Min guard         General transfer comment: x 3 reps; cues for hand placement and to fully finish turning prior to sitting  Ambulation/Gait Ambulation/Gait assistance: Min guard;Min assist Gait Distance (Feet): 300 Feet Assistive device: Rolling walker (2 wheeled);None Gait Pattern/deviations:  Step-through pattern;Narrow base of support;Staggering right Gait velocity: decreased   General Gait Details: improved steering of RW and staying on path; attempted ambulation without RW with pt staggering to her right with min assist to recover   Stairs             Wheelchair Mobility    Modified Rankin (Stroke Patients Only) Modified Rankin (Stroke Patients Only) Pre-Morbid Rankin Score: No symptoms Modified Rankin: Moderately severe disability     Balance Overall balance assessment: Needs assistance Sitting-balance support: No upper extremity supported;Feet supported Sitting balance-Leahy Scale: Fair     Standing balance support: Bilateral upper extremity supported;During functional activity Standing balance-Leahy Scale: Poor Standing balance comment: reliant on external support or AD, but notably improved over the last week.                            Cognition Arousal/Alertness: Lethargic (sleeping on arrival with head on bed rail) Behavior During Therapy: Flat affect Overall Cognitive Status: Impaired/Different from baseline Area of Impairment: Orientation;Attention;Following commands;Awareness;Problem solving;Memory;Safety/judgement                 Orientation Level: Time;Place;Situation (could not recall any info provided >30 seconds) Current Attention Level: Sustained;Focused Memory: Decreased recall of precautions;Decreased short-term memory Following Commands: Follows one step commands with increased time Safety/Judgement: Decreased awareness of safety;Decreased awareness of deficits Awareness: Intellectual Problem Solving: Slow processing;Decreased initiation;Difficulty sequencing;Requires verbal cues General Comments: multiple attempts for her to read the digital clock on the wall "10:49" asked if it was morning  or night, she looked out the window (bright sunshine) and stated it was night; mutliple times repeated to her (and had her repeat  back) that she was in [facility], that she'd had a stroke--she could not recall for >30 seconds)      Exercises      General Comments        Pertinent Vitals/Pain Pain Assessment: Faces Faces Pain Scale: Hurts a little bit Pain Location: stomach Pain Descriptors / Indicators:  (unable to describe) Pain Intervention(s): Monitored during session    Home Living                      Prior Function            PT Goals (current goals can now be found in the care plan section) Acute Rehab PT Goals Patient Stated Goal: To improve mobility PT Goal Formulation: With patient Time For Goal Achievement: 11/26/19 Potential to Achieve Goals: Fair Progress towards PT goals: Goals met and updated - see care plan    Frequency    Min 3X/week      PT Plan Current plan remains appropriate    Co-evaluation              AM-PAC PT "6 Clicks" Mobility   Outcome Measure  Help needed turning from your back to your side while in a flat bed without using bedrails?: None Help needed moving from lying on your back to sitting on the side of a flat bed without using bedrails?: A Little Help needed moving to and from a bed to a chair (including a wheelchair)?: A Little Help needed standing up from a chair using your arms (e.g., wheelchair or bedside chair)?: A Little Help needed to walk in hospital room?: A Little Help needed climbing 3-5 steps with a railing? : A Lot 6 Click Score: 18    End of Session Equipment Utilized During Treatment: Gait belt Activity Tolerance: Patient tolerated treatment well Patient left: in chair;with call bell/phone within reach;with chair alarm set   PT Visit Diagnosis: Other abnormalities of gait and mobility (R26.89);Other symptoms and signs involving the nervous system (R29.898)     Time: 0045-9977 PT Time Calculation (min) (ACUTE ONLY): 32 min  Charges:  $Gait Training: 23-37 mins                      Arby Barrette, PT Pager  [phone]    Rexanne Mano 11/12/2019, 11:25 AM

## 2019-11-12 NOTE — Progress Notes (Signed)
°  Speech Language Pathology Treatment: Dysphagia;Cognitive-Linquistic  Patient Details Name: Atavia Poppe MRN: [id number] DOB: 1965-07-02 Today's Date: 11/12/2019 Time: 2563-8937 SLP Time Calculation (min) (ACUTE ONLY): 37 min  Assessment / Plan / Recommendation Clinical Impression  Pt was seen for treatment and was cooperative throughout the session. RN indicated that the pt has been tolerating the current upgraded diet without difficulty. Pt consumed regular texture solids, mixed consistency boluses (dysphagia 3 with thin liquids) and thin liquids via straw using consecutive swallows without significant difficulty including signs of aspiration. It is recommended that the current diet be continued. Further skilled SLP services are not clinically indicated for swallowing but SLP will continue to follow for cognitive-linguistic treatment. Pt was oriented to person was unable to demonstrate orientation to place or time despite verbal prompts and cues for reasoning. She demonstrated 40% accuracy with a simple reasoning (category naming) tasks increasing to 100% with verbal prompts and phonemic cues. She was unable to identify concrete similarities and differences between objects despite support from the SLP. She achieved 100% accuracy with 4-item immediate recall but 0% accuracy was demonstrated with recall of 5 related items despite semantic and phonemic cues. She achieved 100% accuracy with identification of money values (multiple bills combined) but demonstrated 60% accuracy when bills were combined with coins; accuracy improved to 100% with verbal prompts and cues to attend to the individual values. SLP will continue to follow pt.    HPI HPI: Pt with acute headache and slurred speech, brought by EMS to hospital. CT head was obtained, revealing an ICH originating from the left basal ganglila, extending into the left lateral, third and 4th ventricles, with a small amount of blood in the right lateral  ventricle as well. Early stages of hydrocephalus were also noted on CT. Acute hypoxia from respiratory failure, requiring intubation on 10/16/19. Extubated 10/21/19.      SLP Plan  Continue with current plan of care       Recommendations  Diet recommendations: Regular;Thin liquid Liquids provided via: Cup;Straw Medication Administration: Whole meds with puree Supervision: Staff to assist with self feeding;Full supervision/cueing for compensatory strategies Compensations: Minimize environmental distractions Postural Changes and/or Swallow Maneuvers: Seated upright 90 degrees                Oral Care Recommendations: Oral care BID Follow up Recommendations: Skilled Nursing facility SLP Visit Diagnosis: Dysphagia, unspecified (R13.10) Plan: Continue with current plan of care       Alic Hilburn I. Vear Clock, MS, CCC-SLP Acute Rehabilitation Services Office number [phone] Pager [phone]                 Scheryl Marten 11/12/2019, 5:07 PM

## 2019-11-12 NOTE — Progress Notes (Signed)
Latoya Cox  VOH:[id number] DOB: 27-Dec-1965 DOA: 10/15/2019 PCP: Myrlene Broker, MD    Brief Narrative:  54 year old with a history of DM, HTN, and arthritis who presented to the ED via EMS after the acute onset of right-sided weakness, altered level of consciousness, and garbled speech following the acute onset of a headache earlier in the day. EMS confirmed a right facial droop. Stat CT head revealed an intracranial hemorrhage originating from the left basal ganglia and extending into the left lateral third and fourth ventricle with a small amount of blood in the right lateral ventricle as well.  Early hydrocephalus was appreciated on CT.  She was admitted to the ICU by the neuro hospitalist team.  She was seen by neurosurgery and an EVD was placed.  She remained in the ICU for a prolonged period on ventilator support but ultimately was able to be weaned and transferred out to [facility] on hospital day #9, June 27.  Physical therapy has recommended SNF placement and a bed search is currently underway  Antimicrobials:  None  Subjective: Resting comfortably in bedside chair. No new complaints today.   Assessment & Plan:  Left thalamic intracranial intraventricular hemorrhage CT head at admission noted acute ICH left thalamus with intraventricular penetration and early dilation of the lateral ventricles -MRI not performed due to prior aneurysm clips -EVD placed while in ICU with improvement clinically -CT head June 24th confirmed interval improvement - PT/OT confirm need for SNF placement  Obstructive hydrocephalus Status post EVD - EVD self removed June 24 with no acute issues afterward  Acute hypoxic respiratory failure Neurologic in etiology - resolved  Hypomagnesemia Corrected with supplementation  Hypokalemia Corrected with supplementation  HTN Blood pressure variable -continue to monitor  Dysphagia SLP following -has been advanced to regular diet as of  7/13  DM2 uncontrolled with hyperglycemia Most recent A1c 9.1 -CBG presently well controlled with inpatient regimen  PSVT Continue Coreg  HLD LDL 186 with goal of less than 70 - continue Lipitor  Normocytic anemia Likely simply anemia of critical illness/poor nutrition and acute illness phase -no gross blood loss -follow trend  Mild dehydration Intermittently requiring IV fluids -encouraged consistent oral intake  DVT prophylaxis: Lovenox started 10/19/2019 Code Status: FULL CODE Family Communication:  Status is: Inpatient  Remains inpatient appropriate because:Unsafe d/c plan   Dispo: The patient is from: Home              Anticipated d/c is to: SNF              Anticipated d/c date is: 1 day              Patient currently is medically stable to d/c.   Objective: Blood pressure (!) 156/98, pulse 82, temperature 98.4 F (36.9 C), temperature source Oral, resp. rate 14, height 5\' 2"  (1.575 m), weight 73 kg, SpO2 100 %. No intake or output data in the 24 hours ending 11/12/19 0818 Filed Weights   11/10/19 0352 11/11/19 0325 11/12/19 0323  Weight: 73.5 kg 73.6 kg 73 kg    Examination: General: No acute respiratory distress Lungs: CTA B - no wheezing  Cardiovascular: RRR Abdomen: NT/ND, soft, bs+ Extremities: No C/C/E B LE   CBC: Recent Labs  Lab 11/11/19 0519  WBC 7.4  HGB 11.9*  HCT 38.5  MCV 85.9  PLT 201   Basic Metabolic Panel: Recent Labs  Lab 11/08/19 0409 11/09/19 0420 11/11/19 0519  NA 140 140 139  K 3.3*  3.6 3.8  CL 107 108 108  CO2 23 24 22   GLUCOSE 96 98 129*  BUN 12 8 12   CREATININE 0.78 0.86 1.04*  CALCIUM 8.9 8.6* 9.0  MG 1.9 1.7 2.1   GFR: Estimated Creatinine Clearance: 59.2 mL/min (A) (by C-G formula based on SCr of 1.04 mg/dL (H)).  Liver Function Tests: No results for input(s): AST, ALT, ALKPHOS, BILITOT, PROT, ALBUMIN in the last 168 hours. No results for input(s): LIPASE, AMYLASE in the last 168 hours. No results for  input(s): AMMONIA in the last 168 hours.   HbA1C: Hgb A1c MFr Bld  Date/Time Value Ref Range Status  10/15/2019 07:49 PM 9.1 (H) 4.8 - 5.6 % Final    Comment:    (NOTE)         Prediabetes: 5.7 - 6.4         Diabetes: >6.4         Glycemic control for adults with diabetes: <7.0   01/14/2019 06:43 PM 7.7 (H) 4.8 - 5.6 % Final    Comment:    (NOTE)         Prediabetes: 5.7 - 6.4         Diabetes: >6.4         Glycemic control for adults with diabetes: <7.0     CBG: Recent Labs  Lab 11/11/19 0624 11/11/19 1156 11/11/19 1628 11/11/19 2126 11/12/19 0623  GLUCAP 114* 173* 101* 154* 109*     Scheduled Meds: . amantadine  100 mg Oral BID  . atorvastatin  40 mg Oral Daily  . carvedilol  25 mg Oral BID WC  . chlorhexidine  15 mL Mouth Rinse BID  . docusate  100 mg Oral BID  . enoxaparin (LOVENOX) injection  40 mg Subcutaneous Q24H  . feeding supplement (ENSURE ENLIVE)  237 mL Oral TID BM  . hydrALAZINE  25 mg Oral TID  . insulin aspart  0-9 Units Subcutaneous TID WC  . insulin aspart  4 Units Subcutaneous TID WC  . insulin detemir  13 Units Subcutaneous BID  . mouth rinse  15 mL Mouth Rinse q12n4p  . pantoprazole  40 mg Oral QHS  . polyethylene glycol  17 g Oral Daily  . senna-docusate  1 tablet Oral BID     LOS: 28 days   2127, MD [facility] Office  [phone] Pager - Text Page per Amion  If 7PM-7AM, please contact night-coverage per Amion 11/12/2019, 8:18 AM

## 2019-11-13 DIAGNOSIS — J9601 Acute respiratory failure with hypoxia: Secondary | ICD-10-CM | POA: Diagnosis not present

## 2019-11-13 DIAGNOSIS — I610 Nontraumatic intracerebral hemorrhage in hemisphere, subcortical: Secondary | ICD-10-CM | POA: Diagnosis not present

## 2019-11-13 LAB — GLUCOSE, CAPILLARY
Glucose-Capillary: 103 mg/dL — ABNORMAL HIGH (ref 70–99)
Glucose-Capillary: 163 mg/dL — ABNORMAL HIGH (ref 70–99)
Glucose-Capillary: 134 mg/dL — ABNORMAL HIGH (ref 70–99)
Glucose-Capillary: 140 mg/dL — ABNORMAL HIGH (ref 70–99)

## 2019-11-13 NOTE — Progress Notes (Signed)
Latoya Cox  VPX:[id number] DOB: [birth year] DOA: 10/15/2019 PCP: Myrlene Broker, MD    Brief Narrative:  54 year old with a history of DM, HTN, and arthritis who presented to the ED via EMS after the onset of right-sided weakness, altered level of consciousness, and garbled speech following the acute onset of a headache earlier in the day. EMS confirmed a right facial droop. Stat CT head revealed an intracranial hemorrhage originating from the left basal ganglia and extending into the left lateral third and fourth ventricle with a small amount of blood in the right lateral ventricle as well.  Early hydrocephalus was appreciated on CT.  She was admitted to the ICU by the Neuro Hospitalist team.  She was seen by Neurosurgery and an EVD was placed.  She remained in the ICU for a prolonged period on ventilator support but ultimately was able to be weaned and transferred out to [facility] on hospital day #9, June 27.  Physical therapy has recommended SNF placement and a bed search is currently underway.  Antimicrobials:  None  Subjective: VSS - afebrile - awaiting SNF placement - medically stable for d/c once bed procured   Assessment & Plan:  Left thalamic intracranial intraventricular hemorrhage CT head at admission noted acute ICH left thalamus with intraventricular penetration and early dilation of the lateral ventricles - MRI not performed due to prior aneurysm clips - EVD placed while in ICU with improvement clinically - CT head June 24th confirmed interval improvement - PT/OT confirm need for SNF placement  Obstructive hydrocephalus Status post EVD - EVD self removed June 24 with no acute issues afterward  Acute hypoxic respiratory failure Neurologic in etiology - resolved  Hypomagnesemia Corrected with supplementation  Hypokalemia Corrected with supplementation  HTN Blood pressure presently well controlled  Dysphagia SLP following - advanced to regular diet 7/13  without difficulty  DM2 uncontrolled with hyperglycemia Most recent A1c 9.1 - CBG well controlled with inpatient regimen  PSVT Continue Coreg  HLD LDL 186 with goal of less than 70 - continue Lipitor  Normocytic anemia Likely simply anemia of critical illness/poor nutrition and acute illness phase - no gross blood loss - follow trend intermittently   DVT prophylaxis: Lovenox started 10/19/2019 Code Status: FULL CODE Family Communication: No family present at time of exam Status is: Inpatient  Remains inpatient appropriate because:Unsafe d/c plan   Dispo: The patient is from: Home              Anticipated d/c is to: SNF              Anticipated d/c date is: 1 day              Patient currently is medically stable to d/c.   Objective: Blood pressure 132/88, pulse 89, temperature 98.7 F (37.1 C), temperature source Oral, resp. rate 16, height 5\' 2"  (1.575 m), weight 73 kg, SpO2 100 %. No intake or output data in the 24 hours ending 11/13/19 0725 Filed Weights   11/10/19 0352 11/11/19 0325 11/12/19 0323  Weight: 73.5 kg 73.6 kg 73 kg    Examination: General: NAD Lungs: CTA B - no wheezing  Cardiovascular: RRR Abdomen: NT/ND, soft, bs+ Extremities: No E B LE   CBC: Recent Labs  Lab 11/11/19 0519  WBC 7.4  HGB 11.9*  HCT 38.5  MCV 85.9  PLT 201   Basic Metabolic Panel: Recent Labs  Lab 11/08/19 0409 11/09/19 0420 11/11/19 0519  NA 140 140 139  K  3.3* 3.6 3.8  CL 107 108 108  CO2 23 24 22   GLUCOSE 96 98 129*  BUN 12 8 12   CREATININE 0.78 0.86 1.04*  CALCIUM 8.9 8.6* 9.0  MG 1.9 1.7 2.1   GFR: Estimated Creatinine Clearance: 59.2 mL/min (A) (by C-G formula based on SCr of 1.04 mg/dL (H)).  Liver Function Tests: No results for input(s): AST, ALT, ALKPHOS, BILITOT, PROT, ALBUMIN in the last 168 hours. No results for input(s): LIPASE, AMYLASE in the last 168 hours. No results for input(s): AMMONIA in the last 168 hours.   HbA1C: Hgb A1c MFr Bld    Date/Time Value Ref Range Status  10/15/2019 07:49 PM 9.1 (H) 4.8 - 5.6 % Final    Comment:    (NOTE)         Prediabetes: 5.7 - 6.4         Diabetes: >6.4         Glycemic control for adults with diabetes: <7.0   01/14/2019 06:43 PM 7.7 (H) 4.8 - 5.6 % Final    Comment:    (NOTE)         Prediabetes: 5.7 - 6.4         Diabetes: >6.4         Glycemic control for adults with diabetes: <7.0     CBG: Recent Labs  Lab 11/12/19 0623 11/12/19 1123 11/12/19 1717 11/12/19 2114 11/13/19 0652  GLUCAP 109* 135* 192* 230* 103*     Scheduled Meds: . amantadine  100 mg Oral BID  . atorvastatin  40 mg Oral Daily  . carvedilol  25 mg Oral BID WC  . chlorhexidine  15 mL Mouth Rinse BID  . docusate  100 mg Oral BID  . enoxaparin (LOVENOX) injection  40 mg Subcutaneous Q24H  . feeding supplement (ENSURE ENLIVE)  237 mL Oral TID BM  . hydrALAZINE  25 mg Oral TID  . insulin aspart  0-9 Units Subcutaneous TID WC  . insulin aspart  4 Units Subcutaneous TID WC  . insulin detemir  13 Units Subcutaneous BID  . mouth rinse  15 mL Mouth Rinse q12n4p  . pantoprazole  40 mg Oral QHS  . polyethylene glycol  17 g Oral Daily  . senna-docusate  1 tablet Oral BID     LOS: 29 days   2115, MD [facility] Office  [phone] Pager - Text Page per Amion  If 7PM-7AM, please contact night-coverage per Amion 11/13/2019, 7:25 AM

## 2019-11-13 NOTE — Progress Notes (Signed)
Occupational Therapy Treatment Patient Details Name: Latoya Cox MRN: [id number] DOB: 07-Mar-1966 Today's Date: 11/13/2019    History of present illness 54 y.o. female with DM, HTN and arthritis, [name] and depressed level of consciousness with garbled speech at home, after complaining of a headache earlier in the day. STAT CT head was obtained, revealing an ICH originating from the left basal ganglila, extending into the left lateral, third and 4th ventricles, with a small amount of blood in the right lateral ventricle as well. Early stages of hydrocephalus were also noted on CT. Pt underwent L frontal IVC placement and intubation on 6/18. Pt extubated on 6/23.   OT comments  Patient continues to demonstrate significant cognitive deficits requiring visual and verbal cues to perform tasks. Patient able to perform bed mobility with supervision and ambulated in room with RW and min guard from therapist without overt loss of balance. Patient stood at sink without upper extremity support. Patient asked to organize self care items on sink and patient unable to initiate task. Patient required verbal cues to perform each task.  Patient predominantly stating "I don't know" when asked to perform a task. Patient began rubbing head and reported feeling bad but unable to describe. Patient returned to bed with bed alarm and Rn notified. Continue to recommend rehab at discharge.    Follow Up Recommendations  SNF    Equipment Recommendations  Other (comment)    Recommendations for Other Services Rehab consult    Precautions / Restrictions Precautions Precautions: Fall Precaution Comments: memory recall <5 seconds       Mobility Bed Mobility Overal bed mobility: Needs Assistance Bed Mobility: Sit to Supine;Supine to Sit     Supine to sit: Supervision Sit to supine: Supervision   General bed mobility comments: Performed bed mobiltiy  with physical assistance - but verbal cues to get higher in the bed.  Transfers   Equipment used: Rolling walker (2 wheeled) Transfers: Sit to/from Stand Sit to Stand: Min guard         General transfer comment: Min guard to stand and ambulate in room. Patient was able to locate the bathroom.    Balance Overall balance assessment: Needs assistance Sitting-balance support: No upper extremity supported;Feet supported Sitting balance-Leahy Scale: Good     Standing balance support: During functional activity Standing balance-Leahy Scale: Fair                             ADL either performed or assessed with clinical judgement   ADL                         Lower Body Dressing Details (indicate cue type and reason): Patient able to donn socks with min assist to manuever grips to the sole of foot.               General ADL Comments: Patient stood at sink with min guard and reports not needing to perform grooming as she had already done so. Therapist had patient attempt to clean and organize sink area. Patient needed max verbal and visual cues to perform. Patient unable to gather matching items (soap, tooth brushes, socks) without visual cue. Patient unable to problem solve or initiate placement of items - such as the toothpaste near the tooth brush. Patient able to grossly read item descriptions with some errors. Patient predominantly  stating "I don't know" and needing verbal cue for each and every task. After activity - patient rubbing her head and reporting feeling bad but unable to describe - even with prompting from therapsit.     Vision   Vision Assessment?: Vision impaired- to be further tested in functional context Additional Comments: Difficulty locating matching items on sink. When asked if any visual deficits - blurry visioin, double vision - patient reports no.   Perception     Praxis      Cognition Arousal/Alertness: Awake/alert Behavior  During Therapy: Flat affect Overall Cognitive Status: Impaired/Different from baseline Area of Impairment: Orientation;Attention;Problem solving;Awareness;Memory;Safety/judgement                 Orientation Level: Disoriented to;Place;Time;Situation Current Attention Level: Sustained Memory: Decreased recall of precautions;Decreased short-term memory Following Commands: Follows one step commands with increased time Safety/Judgement: Decreased awareness of safety;Decreased awareness of deficits Awareness: Intellectual Problem Solving: Slow processing;Decreased initiation;Difficulty sequencing;Requires verbal cues General Comments: Unable to state the time or locate the clock in the room.        Exercises     Shoulder Instructions       General Comments      Pertinent Vitals/ Pain       Pain Assessment: No/denies pain  Home Living                                          Prior Functioning/Environment              Frequency  Min 2X/week        Progress Toward Goals  OT Goals(current goals can now be found in the care plan section)  Progress towards OT goals: Progressing toward goals  Acute Rehab OT Goals Patient Stated Goal: did not state OT Goal Formulation: With patient Time For Goal Achievement: 11/25/19 Potential to Achieve Goals: Good  Plan Discharge plan remains appropriate;Frequency remains appropriate    Co-evaluation          OT goals addressed during session: ADL's and self-care;Other (comment) (cognition)      AM-PAC OT "6 Clicks" Daily Activity     Outcome Measure   Help from another person eating meals?: A Lot Help from another person taking care of personal grooming?: A Little Help from another person toileting, which includes using toliet, bedpan, or urinal?: A Lot Help from another person bathing (including washing, rinsing, drying)?: A Little Help from another person to put on and taking off regular upper  body clothing?: A Little Help from another person to put on and taking off regular lower body clothing?: A Little 6 Click Score: 16    End of Session Equipment Utilized During Treatment: Gait belt;Rolling walker  OT Visit Diagnosis: Other abnormalities of gait and mobility (R26.89);Hemiplegia and hemiparesis;Other symptoms and signs involving the nervous system (R29.898) Hemiplegia - Right/Left: Right Hemiplegia - dominant/non-dominant: Dominant Hemiplegia - caused by: Nontraumatic intracerebral hemorrhage   Activity Tolerance Patient tolerated treatment well   Patient Left in bed;with call bell/phone within reach;with bed alarm set   Nurse Communication Mobility status (okay to see per RN)        Time: 8938-1017 OT Time Calculation (min): 15 min  Charges: OT General Charges $OT Visit: 1 Visit OT Treatments $Therapeutic Activity: 8-22 mins  Waldron Session, OTR/L Acute Care Rehab Services  Office [phone] Pager: [phone]    Kelli Churn  11/13/2019, 5:28 PM

## 2019-11-13 NOTE — Progress Notes (Signed)
Physical Therapy Treatment Patient Details Name: Latoya Cox MRN: [id number] DOB: 10/02/1965 Today's Date: 11/13/2019    History of Present Illness 54 y.o. female with DM, HTN and arthritis, [name] and depressed level of consciousness with garbled speech at home, after complaining of a headache earlier in the day. STAT CT head was obtained, revealing an ICH originating from the left basal ganglila, extending into the left lateral, third and 4th ventricles, with a small amount of blood in the right lateral ventricle as well. Early stages of hydrocephalus were also noted on CT. Pt underwent L frontal IVC placement and intubation on 6/18. Pt extubated on 6/23.    PT Comments    Patient progressing slowly towards PT goals. Tolerated gait training without use of DME today requiring Min guard with moments of Min A due to imbalance especially with head turns or direction changes. Pt continues to have significant cognitive deficits. Not able to recall any information after 10 seconds within session despite verbalizing or giving contextual cues. Will follow.   Follow Up Recommendations  SNF     Equipment Recommendations  Rolling walker with 5" wheels;3in1 (PT)    Recommendations for Other Services       Precautions / Restrictions Precautions Precautions: Fall Precaution Comments: memory recall <5 seconds Restrictions Weight Bearing Restrictions: No    Mobility  Bed Mobility Overal bed mobility: Needs Assistance Bed Mobility: Sit to Supine       Sit to supine: Supervision;HOB elevated   General bed mobility comments: Able to get into bed without assist.  Transfers Overall transfer level: Needs assistance Equipment used: None Transfers: Sit to/from Stand Sit to Stand: Min guard         General transfer comment: Min guard for safety. Stood from Orthoptist.  Ambulation/Gait Ambulation/Gait assistance: Min  assist;Min guard Gait Distance (Feet): 300 Feet Assistive device: None Gait Pattern/deviations: Step-through pattern;Narrow base of support;Staggering right;Staggering left Gait velocity: decreased Gait velocity interpretation: <1.8 ft/sec, indicate of risk for recurrent falls General Gait Details: Slow, mildly unsteady gait with veering in both directions with imbalance esp with head turns/changes in direction. Min A-Min guard for safety. Difficulty dual tasking.   Stairs             Wheelchair Mobility    Modified Rankin (Stroke Patients Only) Modified Rankin (Stroke Patients Only) Pre-Morbid Rankin Score: No symptoms Modified Rankin: Moderately severe disability     Balance Overall balance assessment: Needs assistance Sitting-balance support: No upper extremity supported;Feet supported Sitting balance-Leahy Scale: Good     Standing balance support: During functional activity Standing balance-Leahy Scale: Fair Standing balance comment: Close Min guard for static standing and Min A at times for dynamic tasks.             High level balance activites: Head turns;Direction changes High Level Balance Comments: tolerated above with minor deviations in gait. Worked on cognitive tasks during walking- counting etc to work on dual tasking.            Cognition Arousal/Alertness: Awake/alert Behavior During Therapy: Flat affect Overall Cognitive Status: Impaired/Different from baseline Area of Impairment: Orientation;Attention;Following commands;Awareness;Problem solving;Memory;Safety/judgement                 Orientation Level: Disoriented to;Place;Time;Situation Current Attention Level: Sustained Memory: Decreased recall of precautions;Decreased short-term memory Following Commands: Follows one step commands with increased time Safety/Judgement: Decreased awareness of safety;Decreased awareness of deficits Awareness: Intellectual Problem  Solving: Slow  processing;Decreased initiation;Difficulty sequencing;Requires verbal cues General Comments: Could not recall any information provided after 10 seconds for orientation. When asked if it was morning or night, and told the sun was out, pt stating it is night time. Not able to state Month given 2 options and that it was summer time. "I don't know" to most questions asked.      Exercises      General Comments        Pertinent Vitals/Pain Pain Assessment: No/denies pain    Home Living                      Prior Function            PT Goals (current goals can now be found in the care plan section) Progress towards PT goals: Progressing toward goals    Frequency    Min 3X/week      PT Plan Current plan remains appropriate    Co-evaluation              AM-PAC PT "6 Clicks" Mobility   Outcome Measure  Help needed turning from your back to your side while in a flat bed without using bedrails?: None Help needed moving from lying on your back to sitting on the side of a flat bed without using bedrails?: None Help needed moving to and from a bed to a chair (including a wheelchair)?: A Little Help needed standing up from a chair using your arms (e.g., wheelchair or bedside chair)?: A Little Help needed to walk in hospital room?: A Little Help needed climbing 3-5 steps with a railing? : A Lot 6 Click Score: 19    End of Session Equipment Utilized During Treatment: Gait belt Activity Tolerance: Patient tolerated treatment well Patient left: in bed;with call bell/phone within reach;with bed alarm set Nurse Communication: Mobility status PT Visit Diagnosis: Other abnormalities of gait and mobility (R26.89);Other symptoms and signs involving the nervous system (R29.898)     Time: 6203-5597 PT Time Calculation (min) (ACUTE ONLY): 13 min  Charges:  $Gait Training: 8-22 mins                     Vale Haven, PT, DPT Acute Rehabilitation Services Pager  [phone] Office [phone]       Blake Divine A Lanier Ensign 11/13/2019, 12:41 PM

## 2019-11-14 LAB — GLUCOSE, CAPILLARY
Glucose-Capillary: 108 mg/dL — ABNORMAL HIGH (ref 70–99)
Glucose-Capillary: 222 mg/dL — ABNORMAL HIGH (ref 70–99)
Glucose-Capillary: 154 mg/dL — ABNORMAL HIGH (ref 70–99)
Glucose-Capillary: 122 mg/dL — ABNORMAL HIGH (ref 70–99)

## 2019-11-14 NOTE — Progress Notes (Signed)
Latoya Cox  ELF:[id number] DOB: May 31, 1965 DOA: 10/15/2019 PCP: Myrlene Broker, MD    Brief Narrative:  54 year old with a history of DM, HTN, and arthritis who presented to the ED via EMS after the onset of right-sided weakness, altered level of consciousness, and garbled speech following the acute onset of a headache earlier in the day. EMS confirmed a right facial droop. Stat CT head revealed an intracranial hemorrhage originating from the left basal ganglia and extending into the left lateral third and fourth ventricle with a small amount of blood in the right lateral ventricle as well.  Early hydrocephalus was appreciated on CT.  She was admitted to the ICU by the Neuro Hospitalist team.  She was seen by Neurosurgery and an EVD was placed.  She remained in the ICU for a prolonged period on ventilator support but ultimately was able to be weaned and transferred out to [facility] on hospital day #9, June 27.  Physical therapy has recommended SNF placement and a bed search is currently underway.  Antimicrobials:  None  Subjective: VSS - afebrile - awaiting SNF placement - medically stable for d/c once bed procured   Assessment & Plan:  Left thalamic intracranial intraventricular hemorrhage CT head at admission noted acute ICH left thalamus with intraventricular penetration and early dilation of the lateral ventricles - MRI not performed due to prior aneurysm clips - EVD placed while in ICU with improvement clinically - CT head June 24th confirmed interval improvement - PT/OT confirm need for SNF placement  Obstructive hydrocephalus Status post EVD - EVD self removed June 24 with no acute issues afterward  Acute hypoxic respiratory failure Neurologic in etiology - resolved  Hypomagnesemia Corrected with supplementation  Hypokalemia Corrected with supplementation  HTN Blood pressure presently well controlled  Dysphagia SLP following - advanced to regular diet 7/13  without difficulty  DM2 uncontrolled with hyperglycemia Most recent A1c 9.1 - CBG well controlled with inpatient regimen  PSVT Continue Coreg  HLD LDL 186 with goal of less than 70 - continue Lipitor  Normocytic anemia Likely simply anemia of critical illness/poor nutrition and acute illness phase - no gross blood loss - follow trend intermittently   DVT prophylaxis: Lovenox started 10/19/2019 Code Status: FULL CODE Family Communication:  Status is: Inpatient  Remains inpatient appropriate because:Unsafe d/c plan   Dispo: The patient is from: Home              Anticipated d/c is to: SNF              Anticipated d/c date is: 1 day              Patient currently is medically stable to d/c.   Objective: Blood pressure (!) 121/95, pulse 83, temperature 98 F (36.7 C), temperature source Oral, resp. rate 18, height 5\' 2"  (1.575 m), weight 73 kg, SpO2 100 %.  Intake/Output Summary (Last 24 hours) at 11/14/2019 0833 Last data filed at 11/14/2019 0728 Gross per 24 hour  Intake 240 ml  Output 650 ml  Net -410 ml   Filed Weights   11/10/19 0352 11/11/19 0325 11/12/19 0323  Weight: 73.5 kg 73.6 kg 73 kg    Examination: Exam not indicated today   CBC: Recent Labs  Lab 11/11/19 0519  WBC 7.4  HGB 11.9*  HCT 38.5  MCV 85.9  PLT 201   Basic Metabolic Panel: Recent Labs  Lab 11/08/19 0409 11/09/19 0420 11/11/19 0519  NA 140 140 139  K  3.3* 3.6 3.8  CL 107 108 108  CO2 23 24 22   GLUCOSE 96 98 129*  BUN 12 8 12   CREATININE 0.78 0.86 1.04*  CALCIUM 8.9 8.6* 9.0  MG 1.9 1.7 2.1   GFR: Estimated Creatinine Clearance: 59.2 mL/min (A) (by C-G formula based on SCr of 1.04 mg/dL (H)).  Liver Function Tests: No results for input(s): AST, ALT, ALKPHOS, BILITOT, PROT, ALBUMIN in the last 168 hours. No results for input(s): LIPASE, AMYLASE in the last 168 hours. No results for input(s): AMMONIA in the last 168 hours.   HbA1C: Hgb A1c MFr Bld  Date/Time Value Ref  Range Status  10/15/2019 07:49 PM 9.1 (H) 4.8 - 5.6 % Final    Comment:    (NOTE)         Prediabetes: 5.7 - 6.4         Diabetes: >6.4         Glycemic control for adults with diabetes: <7.0   01/14/2019 06:43 PM 7.7 (H) 4.8 - 5.6 % Final    Comment:    (NOTE)         Prediabetes: 5.7 - 6.4         Diabetes: >6.4         Glycemic control for adults with diabetes: <7.0     CBG: Recent Labs  Lab 11/13/19 0652 11/13/19 1108 11/13/19 1619 11/13/19 2124 11/14/19 0601  GLUCAP 103* 163* 134* 140* 108*     Scheduled Meds: . amantadine  100 mg Oral BID  . atorvastatin  40 mg Oral Daily  . carvedilol  25 mg Oral BID WC  . chlorhexidine  15 mL Mouth Rinse BID  . docusate  100 mg Oral BID  . enoxaparin (LOVENOX) injection  40 mg Subcutaneous Q24H  . feeding supplement (ENSURE ENLIVE)  237 mL Oral TID BM  . hydrALAZINE  25 mg Oral TID  . insulin aspart  0-9 Units Subcutaneous TID WC  . insulin aspart  4 Units Subcutaneous TID WC  . insulin detemir  13 Units Subcutaneous BID  . mouth rinse  15 mL Mouth Rinse q12n4p  . pantoprazole  40 mg Oral QHS  . polyethylene glycol  17 g Oral Daily  . senna-docusate  1 tablet Oral BID     LOS: 30 days   2125, MD [facility] Office  [phone] Pager - Text Page per Amion  If 7PM-7AM, please contact night-coverage per Amion 11/14/2019, 8:33 AM

## 2019-11-15 DIAGNOSIS — J9601 Acute respiratory failure with hypoxia: Secondary | ICD-10-CM | POA: Diagnosis not present

## 2019-11-15 DIAGNOSIS — I610 Nontraumatic intracerebral hemorrhage in hemisphere, subcortical: Secondary | ICD-10-CM | POA: Diagnosis not present

## 2019-11-15 DIAGNOSIS — R4182 Altered mental status, unspecified: Secondary | ICD-10-CM | POA: Diagnosis not present

## 2019-11-15 LAB — GLUCOSE, CAPILLARY
Glucose-Capillary: 106 mg/dL — ABNORMAL HIGH (ref 70–99)
Glucose-Capillary: 202 mg/dL — ABNORMAL HIGH (ref 70–99)
Glucose-Capillary: 154 mg/dL — ABNORMAL HIGH (ref 70–99)
Glucose-Capillary: 100 mg/dL — ABNORMAL HIGH (ref 70–99)

## 2019-11-15 LAB — BASIC METABOLIC PANEL
Anion gap: 9 (ref 5–15)
BUN: 12 mg/dL (ref 6–20)
CO2: 29 mmol/L (ref 22–32)
Calcium: 9.2 mg/dL (ref 8.9–10.3)
Chloride: 101 mmol/L (ref 98–111)
Creatinine, Ser: 0.83 mg/dL (ref 0.44–1.00)
GFR calc Af Amer: >60 mL/min (ref >60)
GFR calc non Af Amer: >60 mL/min (ref >60)
Glucose, Bld: 110 mg/dL — ABNORMAL HIGH (ref 70–99)
Potassium: 2.8 mmol/L — ABNORMAL LOW (ref 3.5–5.1)
Sodium: 139 mmol/L (ref 135–145)

## 2019-11-15 LAB — CBC
HCT: 39.7 % (ref 36.0–46.0)
Hemoglobin: 12.6 g/dL (ref 12.0–15.0)
MCH: 27.5 pg (ref 26.0–34.0)
MCHC: 31.7 g/dL (ref 30.0–36.0)
MCV: 86.5 fL (ref 80.0–100.0)
Platelets: 135 10*3/uL — ABNORMAL LOW (ref 150–400)
RBC: 4.59 MIL/uL (ref 3.87–5.11)
RDW: 14.1 % (ref 11.5–15.5)
WBC: 7.4 10*3/uL (ref 4.0–10.5)
nRBC: 0 % (ref 0.0–0.2)

## 2019-11-15 LAB — MAGNESIUM: Magnesium: 1.9 mg/dL (ref 1.7–2.4)

## 2019-11-15 MED ORDER — POTASSIUM CHLORIDE CRYS ER 20 MEQ PO TBCR
40.0000 meq | EXTENDED_RELEASE_TABLET | Freq: Three times a day (TID) | ORAL | Status: AC
  Administered 2019-11-15 – 2019-11-16 (×6): 40 meq via ORAL
  Filled 2019-11-15: qty 2
  Filled 2019-11-15: qty 4
  Filled 2019-11-15 (×6): qty 2

## 2019-11-15 MED ORDER — LISINOPRIL 5 MG PO TABS
5.0000 mg | ORAL_TABLET | Freq: Every day | ORAL | Status: DC
  Administered 2019-11-15 – 2019-11-16 (×2): 5 mg via ORAL
  Filled 2019-11-15 (×2): qty 1

## 2019-11-15 NOTE — Progress Notes (Signed)
Latoya Cox  DVV:[id number] DOB: Sep 20, 1965 DOA: 10/15/2019 PCP: Myrlene Broker, MD    Brief Narrative:  54 year old with a history of DM, HTN, and arthritis who presented to the ED via EMS after the onset of right-sided weakness, altered level of consciousness, and garbled speech following the acute onset of a headache earlier in the day. EMS confirmed a right facial droop. Stat CT head revealed an intracranial hemorrhage originating from the left basal ganglia and extending into the left lateral third and fourth ventricle with a small amount of blood in the right lateral ventricle as well.  Early hydrocephalus was appreciated on CT.  She was admitted to the ICU by the Neuro Hospitalist team.  She was seen by Neurosurgery and an EVD was placed.  She remained in the ICU for a prolonged period on ventilator support but ultimately was able to be weaned and transferred out to [facility] on hospital day #9, June 27.  Physical therapy has recommended SNF placement and a bed search is currently underway.  Antimicrobials:  None  Subjective: Blood pressure is trending upward.  Hypokalemia is proving to be a recurring issue.  Patient has no complaints.  She is sitting up in a bedside chair resting comfortably.  She is anxious to be transferred to a rehab facility.  Assessment & Plan:  Left thalamic intracranial intraventricular hemorrhage CT head at admission noted acute ICH left thalamus with intraventricular penetration and early dilation of the lateral ventricles - MRI not performed due to prior aneurysm clips - EVD placed while in ICU with improvement clinically - CT head June 24th confirmed interval improvement - PT/OT confirm need for SNF placement  Obstructive hydrocephalus Status post EVD - EVD self removed June 24 with no acute issues afterward  Acute hypoxic respiratory failure Neurologic in etiology - resolved  Hypomagnesemia Corrected with supplementation  Severe  hypokalemia Recurring and now severe -increase replacement and follow  HTN Blood pressure trending upward -adjust treatment and follow trend  Dysphagia SLP following - advanced to regular diet 7/13 without difficulty  DM2 uncontrolled with hyperglycemia Most recent A1c 9.1 - CBG controlled at present  PSVT Continue Coreg  HLD LDL 186 with goal of less than 70 - continue Lipitor  Normocytic anemia Likely simply anemia of critical illness/poor nutrition and acute illness phase - no gross blood loss -hemoglobin has now spontaneously improved to normal range   DVT prophylaxis: Lovenox started 10/19/2019 Code Status: FULL CODE Family Communication:  Status is: Inpatient  Remains inpatient appropriate because:Unsafe d/c plan   Dispo: The patient is from: Home              Anticipated d/c is to: SNF              Anticipated d/c date is: 1 day              Patient currently is medically stable to d/c.   Objective: Blood pressure (!) 157/76, pulse 62, temperature 98.1 F (36.7 C), temperature source Oral, resp. rate 18, height 5\' 2"  (1.575 m), weight 73 kg, SpO2 100 %. No intake or output data in the 24 hours ending 11/15/19 0739 Filed Weights   11/10/19 0352 11/11/19 0325 11/12/19 0323  Weight: 73.5 kg 73.6 kg 73 kg    Examination: General: No acute respiratory distress Lungs: CTA B without wheezing Cardiovascular: Regular rate and rhythm without murmur gallop or rub normal S1 and S2 Abdomen: NT/ND, soft, BS positive, no rebound Extremities: No significant  cyanosis, clubbing, or edema bilateral lower extremities   CBC: Recent Labs  Lab 11/11/19 0519 11/15/19 0506  WBC 7.4 7.4  HGB 11.9* 12.6  HCT 38.5 39.7  MCV 85.9 86.5  PLT 201 135*   Basic Metabolic Panel: Recent Labs  Lab 11/09/19 0420 11/11/19 0519 11/15/19 0506  NA 140 139 139  K 3.6 3.8 2.8*  CL 108 108 101  CO2 24 22 29   GLUCOSE 98 129* 110*  BUN 8 12 12   CREATININE 0.86 1.04* 0.83  CALCIUM  8.6* 9.0 9.2  MG 1.7 2.1 1.9   GFR: Estimated Creatinine Clearance: 74.2 mL/min (by C-G formula based on SCr of 0.83 mg/dL).  Liver Function Tests: No results for input(s): AST, ALT, ALKPHOS, BILITOT, PROT, ALBUMIN in the last 168 hours. No results for input(s): LIPASE, AMYLASE in the last 168 hours. No results for input(s): AMMONIA in the last 168 hours.   HbA1C: Hgb A1c MFr Bld  Date/Time Value Ref Range Status  10/15/2019 07:49 PM 9.1 (H) 4.8 - 5.6 % Final    Comment:    (NOTE)         Prediabetes: 5.7 - 6.4         Diabetes: >6.4         Glycemic control for adults with diabetes: <7.0   01/14/2019 06:43 PM 7.7 (H) 4.8 - 5.6 % Final    Comment:    (NOTE)         Prediabetes: 5.7 - 6.4         Diabetes: >6.4         Glycemic control for adults with diabetes: <7.0     CBG: Recent Labs  Lab 11/14/19 0601 11/14/19 1116 11/14/19 1544 11/14/19 2144 11/15/19 0656  GLUCAP 108* 222* 154* 122* 106*     Scheduled Meds: . amantadine  100 mg Oral BID  . atorvastatin  40 mg Oral Daily  . carvedilol  25 mg Oral BID WC  . chlorhexidine  15 mL Mouth Rinse BID  . docusate  100 mg Oral BID  . enoxaparin (LOVENOX) injection  40 mg Subcutaneous Q24H  . feeding supplement (ENSURE ENLIVE)  237 mL Oral TID BM  . hydrALAZINE  25 mg Oral TID  . insulin aspart  0-9 Units Subcutaneous TID WC  . insulin aspart  4 Units Subcutaneous TID WC  . insulin detemir  13 Units Subcutaneous BID  . mouth rinse  15 mL Mouth Rinse q12n4p  . pantoprazole  40 mg Oral QHS  . polyethylene glycol  17 g Oral Daily  . potassium chloride  40 mEq Oral TID  . senna-docusate  1 tablet Oral BID     LOS: 31 days   11/16/19, MD [facility] Office  [phone] Pager - Text Page per Amion  If 7PM-7AM, please contact night-coverage per Amion 11/15/2019, 7:39 AM

## 2019-11-16 LAB — BASIC METABOLIC PANEL
Anion gap: 11 (ref 5–15)
BUN: 14 mg/dL (ref 6–20)
CO2: 24 mmol/L (ref 22–32)
Calcium: 9 mg/dL (ref 8.9–10.3)
Chloride: 108 mmol/L (ref 98–111)
Creatinine, Ser: 0.95 mg/dL (ref 0.44–1.00)
GFR calc Af Amer: >60 mL/min (ref >60)
GFR calc non Af Amer: >60 mL/min (ref >60)
Glucose, Bld: 141 mg/dL — ABNORMAL HIGH (ref 70–99)
Potassium: 3.9 mmol/L (ref 3.5–5.1)
Sodium: 143 mmol/L (ref 135–145)

## 2019-11-16 LAB — GLUCOSE, CAPILLARY
Glucose-Capillary: 90 mg/dL (ref 70–99)
Glucose-Capillary: 188 mg/dL — ABNORMAL HIGH (ref 70–99)
Glucose-Capillary: 203 mg/dL — ABNORMAL HIGH (ref 70–99)
Glucose-Capillary: 280 mg/dL — ABNORMAL HIGH (ref 70–99)

## 2019-11-16 NOTE — Progress Notes (Signed)
Latoya Cox  JTT:[id number] DOB: 24-Mar-1966 DOA: 10/15/2019 PCP: Myrlene Broker, MD    Brief Narrative:  54 year old with a history of DM, HTN, and arthritis who presented to the ED via EMS after the onset of right-sided weakness, altered level of consciousness, and garbled speech following the acute onset of a headache earlier in the day. EMS confirmed a right facial droop. Stat CT head revealed an intracranial hemorrhage originating from the left basal ganglia and extending into the left lateral third and fourth ventricle with a small amount of blood in the right lateral ventricle as well.  Early hydrocephalus was appreciated on CT.  She was admitted to the ICU by the Neuro Hospitalist team.  She was seen by Neurosurgery and an EVD was placed.  She remained in the ICU for a prolonged period on ventilator support but ultimately was able to be weaned and transferred out to [facility] on hospital day #9, June 27.  Physical therapy has recommended SNF placement and a bed search is currently underway.  Subjective: No new complaints today.   Assessment & Plan:  Left thalamic intracranial intraventricular hemorrhage CT head at admission noted acute ICH left thalamus with intraventricular penetration and early dilation of the lateral ventricles - MRI not performed due to prior aneurysm clips - EVD placed while in ICU with improvement clinically - CT head June 24th confirmed interval improvement - PT/OT confirm need for SNF placement  Obstructive hydrocephalus Status post EVD - EVD self removed June 24 with no acute issues afterward  Acute hypoxic respiratory failure Neurologic in etiology - resolved  Hypomagnesemia Corrected with supplementation  Severe hypokalemia Corrected with supplementation  HTN Blood pressure control improved w/ adjustment in meds 7/18 - follow w/o change at this time   Dysphagia SLP has evaluated - advanced to regular diet 7/13 without  difficulty  DM2 uncontrolled with hyperglycemia Most recent A1c 9.1 - CBG controlled at present  PSVT Continue Coreg  HLD LDL 186 with goal of less than 70 - continue Lipitor  Normocytic anemia Likely simply anemia of critical illness/poor nutrition and acute illness phase - no gross blood loss - hemoglobin has now spontaneously improved to normal range   DVT prophylaxis: Lovenox started 10/19/2019 Code Status: FULL CODE Family Communication:  Status is: Inpatient  Remains inpatient appropriate because:Unsafe d/c plan   Dispo: The patient is from: Home              Anticipated d/c is to: SNF              Anticipated d/c date is: 1 day              Patient currently is medically stable to d/c.   Objective: Blood pressure 116/82, pulse 84, temperature 97.7 F (36.5 C), temperature source Oral, resp. rate 16, height 5\' 2"  (1.575 m), weight 73 kg, SpO2 100 %. No intake or output data in the 24 hours ending 11/16/19 1422 Filed Weights   11/10/19 0352 11/11/19 0325 11/12/19 0323  Weight: 73.5 kg 73.6 kg 73 kg    Examination: No exam today    CBC: Recent Labs  Lab 11/11/19 0519 11/15/19 0506  WBC 7.4 7.4  HGB 11.9* 12.6  HCT 38.5 39.7  MCV 85.9 86.5  PLT 201 135*   Basic Metabolic Panel: Recent Labs  Lab 11/11/19 0519 11/15/19 0506 11/16/19 0828  NA 139 139 143  K 3.8 2.8* 3.9  CL 108 101 108  CO2 22 29 24  GLUCOSE 129* 110* 141*  BUN 12 12 14   CREATININE 1.04* 0.83 0.95  CALCIUM 9.0 9.2 9.0  MG 2.1 1.9  --    GFR: Estimated Creatinine Clearance: 64.8 mL/min (by C-G formula based on SCr of 0.95 mg/dL).  Liver Function Tests: No results for input(s): AST, ALT, ALKPHOS, BILITOT, PROT, ALBUMIN in the last 168 hours. No results for input(s): LIPASE, AMYLASE in the last 168 hours. No results for input(s): AMMONIA in the last 168 hours.   HbA1C: Hgb A1c MFr Bld  Date/Time Value Ref Range Status  10/15/2019 07:49 PM 9.1 (H) 4.8 - 5.6 % Final     Comment:    (NOTE)         Prediabetes: 5.7 - 6.4         Diabetes: >6.4         Glycemic control for adults with diabetes: <7.0   01/14/2019 06:43 PM 7.7 (H) 4.8 - 5.6 % Final    Comment:    (NOTE)         Prediabetes: 5.7 - 6.4         Diabetes: >6.4         Glycemic control for adults with diabetes: <7.0     CBG: Recent Labs  Lab 11/15/19 1120 11/15/19 1629 11/15/19 2117 11/16/19 0612 11/16/19 1122  GLUCAP 202* 154* 100* 90 188*     Scheduled Meds: . atorvastatin  40 mg Oral Daily  . carvedilol  25 mg Oral BID WC  . chlorhexidine  15 mL Mouth Rinse BID  . docusate  100 mg Oral BID  . enoxaparin (LOVENOX) injection  40 mg Subcutaneous Q24H  . feeding supplement (ENSURE ENLIVE)  237 mL Oral TID BM  . insulin aspart  0-9 Units Subcutaneous TID WC  . insulin aspart  4 Units Subcutaneous TID WC  . insulin detemir  13 Units Subcutaneous BID  . lisinopril  5 mg Oral Daily  . mouth rinse  15 mL Mouth Rinse q12n4p  . pantoprazole  40 mg Oral QHS  . polyethylene glycol  17 g Oral Daily  . potassium chloride  40 mEq Oral TID  . senna-docusate  1 tablet Oral BID     LOS: 32 days   11/18/19, MD [facility] Office  [phone] Pager - Text Page per Amion  If 7PM-7AM, please contact night-coverage per Amion 11/16/2019, 2:22 PM

## 2019-11-16 NOTE — Plan of Care (Signed)
  Problem: Education: Goal: Knowledge of disease or condition will improve Outcome: Progressing   Problem: Nutrition: Goal: Risk of aspiration will decrease Outcome: Progressing   Problem: Self-Care: Goal: Ability to participate in self-care as condition permits will improve Outcome: Progressing

## 2019-11-16 NOTE — Progress Notes (Signed)
Physical Therapy Treatment Patient Details Name: Latoya Cox MRN: [id number] DOB: 1965-06-16 Today's Date: 11/16/2019    History of Present Illness 54 y.o. female with DM, HTN and arthritis, [name] and depressed level of consciousness with garbled speech at home, after complaining of a headache earlier in the day. STAT CT head was obtained, revealing an ICH originating from the left basal ganglila, extending into the left lateral, third and 4th ventricles, with a small amount of blood in the right lateral ventricle as well. Early stages of hydrocephalus were also noted on CT. Pt underwent L frontal IVC placement and intubation on 6/18. Pt extubated on 6/23.    PT Comments    Pt progressing towards physical therapy goals. Memory recall continues to be <10 seconds even with repetition of information. Focus of session was hallway mobility, path finding activity, and functional balance assessment. Pt is able to ambulate fairly well without the walker however is hesitant to attempt. Will continue to follow and progress as able per POC.    Follow Up Recommendations  SNF     Equipment Recommendations  Rolling walker with 5" wheels;3in1 (PT)    Recommendations for Other Services       Precautions / Restrictions Precautions Precautions: Fall Precaution Comments: memory recall <10 seconds Restrictions Weight Bearing Restrictions: No    Mobility  Bed Mobility Overal bed mobility: Needs Assistance Bed Mobility: Supine to Sit     Supine to sit: Supervision     General bed mobility comments: Performed bed mobiltiy without physical assistance - HOB mildly elevated.   Transfers Overall transfer level: Needs assistance Equipment used: Rolling walker (2 wheeled) Transfers: Sit to/from Stand Sit to Stand: Min guard         General transfer comment: Min guard to stand. VC's for hand placement on seated surface for safety.    Ambulation/Gait Ambulation/Gait assistance: Min guard Gait Distance (Feet): 250 Feet Assistive device: Rolling walker (2 wheeled) Gait Pattern/deviations: Step-through pattern;Narrow base of support Gait velocity: decreased Gait velocity interpretation: <1.8 ft/sec, indicate of risk for recurrent falls General Gait Details: Slow but generally steady with the RW for support. Pt hesitant about attempting ambulation without the walker, but agreeable to try in room at end of session. Min guard assist required with walker and without, however pt reaching for sink for support without walker in room. Required cues throughout gait training for path finding.   Stairs             Wheelchair Mobility    Modified Rankin (Stroke Patients Only) Modified Rankin (Stroke Patients Only) Pre-Morbid Rankin Score: No symptoms Modified Rankin: Moderately severe disability     Balance Overall balance assessment: Needs assistance Sitting-balance support: No upper extremity supported;Feet supported Sitting balance-Leahy Scale: Good Sitting balance - Comments: supervision  Postural control: Right lateral lean;Posterior lean Standing balance support: During functional activity Standing balance-Leahy Scale: Fair                              Cognition Arousal/Alertness: Awake/alert Behavior During Therapy: Flat affect Overall Cognitive Status: Impaired/Different from baseline Area of Impairment: Orientation;Attention;Problem solving;Awareness;Memory;Safety/judgement                 Orientation Level: Disoriented to;Place;Time;Situation Current Attention Level: Sustained Memory: Decreased recall of precautions;Decreased short-term memory Following Commands: Follows one step commands consistently;Follows multi-step commands inconsistently Safety/Judgement: Decreased awareness of safety;Decreased  awareness of deficits Awareness: Intellectual Problem Solving: Slow  processing;Decreased initiation;Difficulty sequencing;Requires verbal cues General Comments: Re-oriented pt to month - if asked again within 10 seconds, could recall the month. If asked again after more than 10 seconds has passed, could not tell me the month.       Exercises      General Comments        Pertinent Vitals/Pain Pain Assessment: No/denies pain Faces Pain Scale: No hurt Pain Intervention(s): Monitored during session    Home Living                      Prior Function            PT Goals (current goals can now be found in the care plan section) Acute Rehab PT Goals Patient Stated Goal: did not state PT Goal Formulation: With patient Time For Goal Achievement: 11/26/19 Potential to Achieve Goals: Fair Progress towards PT goals: Progressing toward goals    Frequency    Min 3X/week      PT Plan Current plan remains appropriate    Co-evaluation              AM-PAC PT "6 Clicks" Mobility   Outcome Measure  Help needed turning from your back to your side while in a flat bed without using bedrails?: None Help needed moving from lying on your back to sitting on the side of a flat bed without using bedrails?: None Help needed moving to and from a bed to a chair (including a wheelchair)?: A Little Help needed standing up from a chair using your arms (e.g., wheelchair or bedside chair)?: A Little Help needed to walk in hospital room?: A Little Help needed climbing 3-5 steps with a railing? : A Lot 6 Click Score: 19    End of Session Equipment Utilized During Treatment: Gait belt Activity Tolerance: Patient tolerated treatment well Patient left: in bed;with call bell/phone within reach;with bed alarm set Nurse Communication: Mobility status PT Visit Diagnosis: Other abnormalities of gait and mobility (R26.89);Other symptoms and signs involving the nervous system (S97.026)     Time: 3785-8850 PT Time Calculation (min) (ACUTE ONLY): 19  min  Charges:  $Gait Training: 8-22 mins                     Latoya Cox, PT, DPT Acute Rehabilitation Services Pager: [phone] Office: [phone]    Latoya Cox 11/16/2019, 12:47 PM

## 2019-11-17 DIAGNOSIS — I610 Nontraumatic intracerebral hemorrhage in hemisphere, subcortical: Secondary | ICD-10-CM | POA: Diagnosis not present

## 2019-11-17 DIAGNOSIS — J9601 Acute respiratory failure with hypoxia: Secondary | ICD-10-CM | POA: Diagnosis not present

## 2019-11-17 LAB — GLUCOSE, CAPILLARY
Glucose-Capillary: 100 mg/dL — ABNORMAL HIGH (ref 70–99)
Glucose-Capillary: 151 mg/dL — ABNORMAL HIGH (ref 70–99)
Glucose-Capillary: 227 mg/dL — ABNORMAL HIGH (ref 70–99)
Glucose-Capillary: 241 mg/dL — ABNORMAL HIGH (ref 70–99)

## 2019-11-17 MED ORDER — PROSOURCE PLUS PO LIQD
30.0000 mL | Freq: Two times a day (BID) | ORAL | Status: DC
  Administered 2019-11-17 – 2019-11-23 (×10): 30 mL via ORAL
  Filled 2019-11-17 (×14): qty 30

## 2019-11-17 MED ORDER — HYDROCHLOROTHIAZIDE 12.5 MG PO CAPS
12.5000 mg | ORAL_CAPSULE | Freq: Every day | ORAL | Status: DC
  Administered 2019-11-17 – 2019-11-23 (×7): 12.5 mg via ORAL
  Filled 2019-11-17 (×7): qty 1

## 2019-11-17 MED ORDER — LISINOPRIL 10 MG PO TABS
10.0000 mg | ORAL_TABLET | Freq: Every day | ORAL | Status: DC
  Administered 2019-11-17 – 2019-11-23 (×7): 10 mg via ORAL
  Filled 2019-11-17 (×7): qty 1

## 2019-11-17 NOTE — Progress Notes (Signed)
Latoya Cox  YBO:[id number] DOB: 01-04-1966 DOA: 10/15/2019 PCP: Myrlene Broker, MD    Brief Narrative:  54 year old with a history of DM, HTN, and arthritis who presented to the ED via EMS after the onset of right-sided weakness, altered level of consciousness, and garbled speech following the acute onset of a headache earlier in the day. EMS confirmed a right facial droop. Stat CT head revealed an intracranial hemorrhage originating from the left basal ganglia and extending into the left lateral third and fourth ventricle with a small amount of blood in the right lateral ventricle as well.  Early hydrocephalus was appreciated on CT.  She was admitted to the ICU by the Neuro Hospitalist team.  She was seen by Neurosurgery and an EVD was placed.  She remained in the ICU for a prolonged period on ventilator support but ultimately was able to be weaned and transferred out to [facility] on hospital day #9, June 27.  Physical therapy has recommended SNF placement and a bed search is currently underway.  Subjective: PT continues to suggest SNF placement for ongoing rehab. Pt resting comfortably at time of my visit. No acute issues per RN.   Assessment & Plan:  Left thalamic intracranial intraventricular hemorrhage CT head at admission noted acute ICH left thalamus with intraventricular penetration and early dilation of the lateral ventricles - MRI not performed due to prior aneurysm clips - EVD placed while in ICU with improvement clinically - CT head June 24th confirmed interval improvement - PT/OT confirm need for SNF placement - clinically stable   Obstructive hydrocephalus Status post EVD - EVD self removed June 24 with no acute issues afterward  Acute hypoxic respiratory failure Neurologic in etiology - resolved  Hypomagnesemia Corrected with supplementation  Severe hypokalemia Corrected with supplementation  HTN Blood pressure remains above goal - adjust tx again today  and follow  Dysphagia SLP has evaluated - advanced to regular diet 7/13 without difficulty  DM2 uncontrolled with hyperglycemia Most recent A1c 9.1 - CBG variable - follow w/o change for now   PSVT Continue Coreg  HLD LDL 186 with goal of less than 70 - continue Lipitor  Normocytic anemia Likely simply anemia of critical illness/poor nutrition and acute illness phase - no gross blood loss - hemoglobin has now spontaneously improved to normal range   DVT prophylaxis: Lovenox started 10/19/2019 Code Status: FULL CODE Family Communication:  Status is: Inpatient  Remains inpatient appropriate because:Unsafe d/c plan   Dispo: The patient is from: Home              Anticipated d/c is to: SNF              Anticipated d/c date is: 1 day              Patient currently is medically stable to d/c.   Objective: Blood pressure (!) 149/100, pulse 93, temperature 97.7 F (36.5 C), temperature source Oral, resp. rate 18, height 5\' 2"  (1.575 m), weight 73 kg, SpO2 100 %.  Intake/Output Summary (Last 24 hours) at 11/17/2019 0856 Last data filed at 11/16/2019 1448 Gross per 24 hour  Intake 240 ml  Output --  Net 240 ml   Filed Weights   11/10/19 0352 11/11/19 0325 11/12/19 0323  Weight: 73.5 kg 73.6 kg 73 kg    Examination: General: NAD Lungs: respirations are comfortable - no wheezing  Extremities: No significant edema bilateral lower extremities    CBC: Recent Labs  Lab 11/11/19 0519  11/15/19 0506  WBC 7.4 7.4  HGB 11.9* 12.6  HCT 38.5 39.7  MCV 85.9 86.5  PLT 201 135*   Basic Metabolic Panel: Recent Labs  Lab 11/11/19 0519 11/15/19 0506 11/16/19 0828  NA 139 139 143  K 3.8 2.8* 3.9  CL 108 101 108  CO2 22 29 24   GLUCOSE 129* 110* 141*  BUN 12 12 14   CREATININE 1.04* 0.83 0.95  CALCIUM 9.0 9.2 9.0  MG 2.1 1.9  --    GFR: Estimated Creatinine Clearance: 64.8 mL/min (by C-G formula based on SCr of 0.95 mg/dL).  Liver Function Tests: No results for  input(s): AST, ALT, ALKPHOS, BILITOT, PROT, ALBUMIN in the last 168 hours. No results for input(s): LIPASE, AMYLASE in the last 168 hours. No results for input(s): AMMONIA in the last 168 hours.   HbA1C: Hgb A1c MFr Bld  Date/Time Value Ref Range Status  10/15/2019 07:49 PM 9.1 (H) 4.8 - 5.6 % Final    Comment:    (NOTE)         Prediabetes: 5.7 - 6.4         Diabetes: >6.4         Glycemic control for adults with diabetes: <7.0   01/14/2019 06:43 PM 7.7 (H) 4.8 - 5.6 % Final    Comment:    (NOTE)         Prediabetes: 5.7 - 6.4         Diabetes: >6.4         Glycemic control for adults with diabetes: <7.0     CBG: Recent Labs  Lab 11/16/19 0612 11/16/19 1122 11/16/19 1658 11/16/19 2107 11/17/19 0615  GLUCAP 90 188* 203* 280* 100*     Scheduled Meds: . atorvastatin  40 mg Oral Daily  . carvedilol  25 mg Oral BID WC  . chlorhexidine  15 mL Mouth Rinse BID  . docusate  100 mg Oral BID  . enoxaparin (LOVENOX) injection  40 mg Subcutaneous Q24H  . feeding supplement (ENSURE ENLIVE)  237 mL Oral TID BM  . insulin aspart  0-9 Units Subcutaneous TID WC  . insulin aspart  4 Units Subcutaneous TID WC  . insulin detemir  13 Units Subcutaneous BID  . lisinopril  5 mg Oral Daily  . mouth rinse  15 mL Mouth Rinse q12n4p  . pantoprazole  40 mg Oral QHS  . polyethylene glycol  17 g Oral Daily  . senna-docusate  1 tablet Oral BID     LOS: 33 days   2108, MD [facility] Office  [phone] Pager - Text Page per Amion  If 7PM-7AM, please contact night-coverage per Amion 11/17/2019, 8:56 AM

## 2019-11-17 NOTE — Plan of Care (Signed)
°  Problem: Coping: Goal: Will verbalize positive feelings about self Outcome: Progressing   Problem: Nutrition: Goal: Risk of aspiration will decrease Outcome: Progressing   Problem: Coping: Goal: Will verbalize positive feelings about self Outcome: Progressing

## 2019-11-17 NOTE — Progress Notes (Signed)
Nutrition Follow-up  DOCUMENTATION CODES:   Obesity unspecified  INTERVENTION:  62ml Prosource Plus po BID, each supplement provides 100 kcal and 15 grams of protein  Continue Magic cup TID with meals, each supplement provides 290 kcal and 9 grams of protein  Continue Ensure Enlive po TID, each supplement provides 350 kcal and 20 grams of protein  NUTRITION DIAGNOSIS:   Inadequate oral intake related to dysphagia as evidenced by NPO status.  Progressing, pt now on regular diet  GOAL:   Patient will meet greater than or equal to 90% of their needs  Progressing  MONITOR:   Diet advancement, Labs, Weight trends, Skin, I & O's  REASON FOR ASSESSMENT:   Ventilator, Consult Enteral/tube feeding initiation and management  ASSESSMENT:   54 year old female who presented on 6/17 as a code stroke. PMH of DM and poorly controlled HTN. CT revealed ICH.  6/18 - worsening hydrocephalus, s/p EVD, intubated 6/23 - extubated 6/24 - EVD self removed 6/25 - s/p MBS, Dysphagia 2 with nectar thick liquids 7/9 - s/p MBS, diet advanced to dysphagia 2 with thin liquids 7/13 - diet advanced to regular  Per MD, pt is medically stable for d/c. Pending SNF placement.   Discussed pt with RN.   Pt's intake varied, but pt is drinking Ensure consistently. Pt still intermittently refusing Magic Cup, but would like to continue receiving them.   PO Intake: 0-100% x last 8 recorded meals (46% average meal intake)  Labs: CBGs 100-280 Medications: Colace, Ensure Enlive TID, Novolog, Levemir, Protonix, Miralax, Senokot-S   Diet Order:   Diet Order            Diet regular Room service appropriate? Yes with Assist; Fluid consistency: Thin  Diet effective now                 EDUCATION NEEDS:   No education needs have been identified at this time  Skin:  Skin Assessment: Skin Integrity Issues: Skin Integrity Issues:: Incisions, Stage II Stage II: buttocks Incisions: L head  Last BM:   7/18  Height:   Ht Readings from Last 1 Encounters:  10/25/19 5\' 2"  (1.575 m)    Weight:   Wt Readings from Last 1 Encounters:  11/12/19 73 kg    Ideal Body Weight:  50 kg  BMI:  Body mass index is 29.44 kg/m.  Estimated Nutritional Needs:   Kcal:  1800-2000  Protein:  90-110 grams  Fluid:  >/= 1.8 L    11/14/19, MS, RD, LDN RD pager number and weekend/on-call pager number located in [place].

## 2019-11-18 DIAGNOSIS — R4182 Altered mental status, unspecified: Secondary | ICD-10-CM | POA: Diagnosis not present

## 2019-11-18 DIAGNOSIS — E1169 Type 2 diabetes mellitus with other specified complication: Secondary | ICD-10-CM | POA: Diagnosis not present

## 2019-11-18 DIAGNOSIS — J9601 Acute respiratory failure with hypoxia: Secondary | ICD-10-CM | POA: Diagnosis not present

## 2019-11-18 DIAGNOSIS — I610 Nontraumatic intracerebral hemorrhage in hemisphere, subcortical: Secondary | ICD-10-CM | POA: Diagnosis not present

## 2019-11-18 LAB — GLUCOSE, CAPILLARY
Glucose-Capillary: 135 mg/dL — ABNORMAL HIGH (ref 70–99)
Glucose-Capillary: 162 mg/dL — ABNORMAL HIGH (ref 70–99)
Glucose-Capillary: 228 mg/dL — ABNORMAL HIGH (ref 70–99)
Glucose-Capillary: 213 mg/dL — ABNORMAL HIGH (ref 70–99)

## 2019-11-18 NOTE — Progress Notes (Signed)
Physical Therapy Treatment Patient Details Name: Latoya Cox MRN: [id number] DOB: Jul 20, 1965 Today's Date: 11/18/2019    History of Present Illness 54 y.o. female with DM, HTN and arthritis, [name] and depressed level of consciousness with garbled speech at home, after complaining of a headache earlier in the day. STAT CT head was obtained, revealing an ICH originating from the left basal ganglila, extending into the left lateral, third and 4th ventricles, with a small amount of blood in the right lateral ventricle as well. Early stages of hydrocephalus were also noted on CT. Pt underwent L frontal IVC placement and intubation on 6/18. Pt extubated on 6/23.    PT Comments    Pt progressing towards physical therapy goals. Was able to perform transfers and ambulation with gross min assist without a RW for support. Pt continues to demonstrate decreased recall (~10 seconds memory retention), and required multimodal cues this session for path finding and multi-step directions. Will continue to follow and progress as able per POC.    Follow Up Recommendations  SNF     Equipment Recommendations  Rolling walker with 5" wheels;3in1 (PT)    Recommendations for Other Services       Precautions / Restrictions Precautions Precautions: Fall Precaution Comments: memory recall <10 seconds Restrictions Weight Bearing Restrictions: No    Mobility  Bed Mobility               General bed mobility comments: Pt was received ambulating to the bathroom with the RN  Transfers Overall transfer level: Needs assistance Equipment used: Rolling walker (2 wheeled) Transfers: Sit to/from Stand Sit to Stand: Min assist         General transfer comment: Assist to power-up to full stand from low commode seat.   Ambulation/Gait Ambulation/Gait assistance: Min guard;Min assist Gait Distance (Feet): 300 Feet Assistive device:  None Gait Pattern/deviations: Step-through pattern;Narrow base of support Gait velocity: decreased Gait velocity interpretation: <1.8 ft/sec, indicate of risk for recurrent falls General Gait Details: Slow and unsteady without UE support. Occasional LOB, especially with turns requiring light min assist to recover.    Stairs             Wheelchair Mobility    Modified Rankin (Stroke Patients Only) Modified Rankin (Stroke Patients Only) Pre-Morbid Rankin Score: No symptoms Modified Rankin: Moderately severe disability     Balance Overall balance assessment: Needs assistance Sitting-balance support: No upper extremity supported;Feet supported Sitting balance-Leahy Scale: Good   Postural control: Right lateral lean;Posterior lean Standing balance support: During functional activity Standing balance-Leahy Scale: Poor Standing balance comment: occasional LOB during dynamic activity.              High level balance activites: Head turns;Sudden stops;Turns;Direction changes (Stepping over objects, stepping around objects) High Level Balance Comments: tolerated above with minor deviations in gait. Pt unable to complete stepping over and around objects - but may be more contributed to cognition.            Cognition Arousal/Alertness: Awake/alert Behavior During Therapy: Flat affect Overall Cognitive Status: Impaired/Different from baseline Area of Impairment: Orientation;Attention;Problem solving;Awareness;Memory;Safety/judgement                 Orientation Level: Disoriented to;Place;Time;Situation Current Attention Level: Sustained Memory: Decreased recall of precautions;Decreased short-term memory Following Commands: Follows one step commands consistently;Follows multi-step commands inconsistently Safety/Judgement: Decreased awareness of safety;Decreased awareness of deficits Awareness: Intellectual Problem Solving: Slow processing;Decreased  initiation;Difficulty  sequencing;Requires verbal cues General Comments: Still overall with flat affect however laughing at a tv show on in her room. If asked what was funny in the show, she could not tell me      Exercises      General Comments        Pertinent Vitals/Pain Pain Assessment: No/denies pain Faces Pain Scale: No hurt    Home Living                      Prior Function            PT Goals (current goals can now be found in the care plan section) Acute Rehab PT Goals Patient Stated Goal: did not state - wants to be in the bed PT Goal Formulation: With patient Time For Goal Achievement: 11/26/19 Potential to Achieve Goals: Fair Progress towards PT goals: Progressing toward goals    Frequency    Min 3X/week      PT Plan Current plan remains appropriate    Co-evaluation              AM-PAC PT "6 Clicks" Mobility   Outcome Measure  Help needed turning from your back to your side while in a flat bed without using bedrails?: None Help needed moving from lying on your back to sitting on the side of a flat bed without using bedrails?: None Help needed moving to and from a bed to a chair (including a wheelchair)?: A Little Help needed standing up from a chair using your arms (e.g., wheelchair or bedside chair)?: A Little Help needed to walk in hospital room?: A Little Help needed climbing 3-5 steps with a railing? : A Lot 6 Click Score: 19    End of Session Equipment Utilized During Treatment: Gait belt Activity Tolerance: Patient tolerated treatment well Patient left: in bed;with call bell/phone within reach;with bed alarm set Nurse Communication: Mobility status PT Visit Diagnosis: Other abnormalities of gait and mobility (R26.89);Other symptoms and signs involving the nervous system (P91.505)     Time: 6979-4801 PT Time Calculation (min) (ACUTE ONLY): 23 min  Charges:  $Gait Training: 8-22 mins $Therapeutic Activity: 8-22 mins                      Conni Slipper, PT, DPT Acute Rehabilitation Services Pager: [phone] Office: [phone]    Marylynn Pearson 11/18/2019, 9:01 AM

## 2019-11-18 NOTE — Progress Notes (Signed)
PROGRESS NOTE    Latoya Cox  IOX:[id number] DOB: May 24, 1965 DOA: 10/15/2019 PCP: Myrlene Broker, MD   Brief Narrative:  HPI on 10/15/2019 by Dr. Caryl Pina (neuro) Latoya Cox is an 54 y.o. female with DM, HTN and arthritis, [name] and depressed level of consciousness with garbled speech at home, after complaining of a headache earlier in the day. Husband told EMS that the patient had been complaining of a headache since the morning, but was with no deficits at that time. At 4:30 PM, she continued to complain about her headache and she also felt sleepy, so she took a nap. Her husband checked on her at 6:30 PM, noted the right sided weakness and depressed level of consciousness, and called EMS. On EMS arrival, they noted the same, as well as right facial droop. She vomited en route and continued to vomit intermittently on arrival to the ED. She was obtunded to somnolent and unable to answer questions intelligibly. STAT CT head was obtained, revealing an ICH originating from the left basal ganglila, extending into the left lateral, third and 4th ventricles, with a small amount of blood in the right lateral ventricle as well. Early stages of hydrocephalus were also noted on CT.   Interim history She was admitted to the ICU by the Neuro Hospitalist team.  She was seen by Neurosurgery and an EVD was placed.  She remained in the ICU for a prolonged period on ventilator support but ultimately was able to be weaned and transferred out to [facility] on hospital day #9, June 27.  Physical therapy has recommended SNF placement and a bed search is currently underway. Assessment & Plan   Left thalamic intracranial intraventricular hemorrhage -On admission showed acute ICH left thalamus and intraventricular penetration, early dilation of lateral ventricles -Initially admitted by neurology -MRI not performed due to prior aneurysm  clips -EVD placed while in ICU with improvement clinically -CT head on October 22, 2019 confirmed interval improvement -Echocardiogram EF 60-65%, grade 1 diastolic dysfunction, impaired relaxation -Carotid Doppler showed bilateral ICA stenosis 1 to 39%, left vertebral artery demonstrates antegrade flow.  Right vertebral artery was not visualized. -PT/OT recommending SNF  Obstructive hydrocephalus -Status post EVD.  EVD was self removed on October 22, 2019 with no acute issues afterward  Acute hypoxic respiratory failure -Resolved, suspect neurologic in etiology  Acute metabolic encephalopathy -unlikely due to infection -suspect secondary to the above  Hypomagnesemia -last checked on 7/18 and was in normal limits, 1.9  Severe hypokalemia -last checked on 7/19, was 3.9  Essential hypertension -Continue Coreg, hydrochlorothiazide, lisinopril  Dysphagia -Speech therapy consulted, currently on regular diet without difficulty  Diabetes mellitus, type II uncontrolled with hyperglycemia -Hemoglobin A1c 9.1 -Continue to monitor CBG, continue insulin sliding scale with premeal insulin and Levemir  PSVT -Asymptomatic, continue Coreg  Hyperlipidemia -LDL 186 -Continue statin  Normocytic anemia -Likely anemia of critical illness/poor nutrition and acute illness phase, no gross blood loss -Hemoglobin was 12.6 on 7/15 -Continue to monitor H&H  DVT Prophylaxis  lovenox  Code Status: Full  Family Communication: None at bedside  Disposition Plan:  Status is: Inpatient  Remains inpatient appropriate because:Altered mental status and Unsafe d/c plan. Patient continues to be alert to self only.   Dispo:  Patient From: Home  Planned Disposition: Skilled Nursing Facility  Expected discharge date:  1-2 days  Medically stable for discharge: Yes   Consultants Neurology Neurosurgery PCCM PMR  Procedures  Echocardiogram Carotid Doppler  Antibiotics   Anti-infectives (From  admission, onward)   None      Subjective:   Latoya Cox seen and examined today.  Patient states she is feeling well this morning.  Denies chest pain or shortness of breath, abdominal pain, nausea or vomiting, diarrhea constipation, dizziness or headache.  States she is tired of being here and wishes to go home.  Objective:   Vitals:   11/17/19 1947 11/17/19 2335 11/18/19 0343 11/18/19 0723  BP: (!) 122/97 139/83 (!) 151/98 (!) 142/93  Pulse: 90 89 89 92  Resp: 17 17 17 18   Temp: 98.3 F (36.8 C) 98 F (36.7 C) 98 F (36.7 C) 99.2 F (37.3 C)  TempSrc: Oral   Oral  SpO2: 100% 98% 100% 100%  Weight:      Height:       No intake or output data in the 24 hours ending 11/18/19 0820 Filed Weights   11/10/19 0352 11/11/19 0325 11/12/19 0323  Weight: 73.5 kg 73.6 kg 73 kg    Exam  General: Well developed, well nourished, NAD, appears stated age  HEENT: NCAT,  mucous membranes moist.   Neck: Supple  Cardiovascular: S1 S2 auscultated, RRR, no murmur  Respiratory: Clear to auscultation bilaterally  Abdomen: Soft, nontender, nondistended, + bowel sounds  Extremities: warm dry without cyanosis clubbing or edema  Neuro: AAOx1 (name only), nonfocal  Psych: Normal affect and demeanor    Data Reviewed: I have personally reviewed following labs and imaging studies  CBC: Recent Labs  Lab 11/15/19 0506  WBC 7.4  HGB 12.6  HCT 39.7  MCV 86.5  PLT 135*   Basic Metabolic Panel: Recent Labs  Lab 11/15/19 0506 11/16/19 0828  NA 139 143  K 2.8* 3.9  CL 101 108  CO2 29 24  GLUCOSE 110* 141*  BUN 12 14  CREATININE 0.83 0.95  CALCIUM 9.2 9.0  MG 1.9  --    GFR: Estimated Creatinine Clearance: 64.8 mL/min (by C-G formula based on SCr of 0.95 mg/dL). Liver Function Tests: No results for input(s): AST, ALT, ALKPHOS, BILITOT, PROT, ALBUMIN in the last 168 hours. No results for input(s): LIPASE, AMYLASE in the last 168 hours. No results for input(s): AMMONIA in  the last 168 hours. Coagulation Profile: No results for input(s): INR, PROTIME in the last 168 hours. Cardiac Enzymes: No results for input(s): CKTOTAL, CKMB, CKMBINDEX, TROPONINI in the last 168 hours. BNP (last 3 results) No results for input(s): PROBNP in the last 8760 hours. HbA1C: No results for input(s): HGBA1C in the last 72 hours. CBG: Recent Labs  Lab 11/17/19 0615 11/17/19 1158 11/17/19 1646 11/17/19 2127 11/18/19 0629  GLUCAP 100* 151* 227* 241* 135*   Lipid Profile: No results for input(s): CHOL, HDL, LDLCALC, TRIG, CHOLHDL, LDLDIRECT in the last 72 hours. Thyroid Function Tests: No results for input(s): TSH, T4TOTAL, FREET4, T3FREE, THYROIDAB in the last 72 hours. Anemia Panel: No results for input(s): VITAMINB12, FOLATE, FERRITIN, TIBC, IRON, RETICCTPCT in the last 72 hours. Urine analysis:    Component Value Date/Time   COLORURINE YELLOW 10/24/2019 1618   APPEARANCEUR CLEAR 10/24/2019 1618   LABSPEC 1.029 10/24/2019 1618   PHURINE 5.0 10/24/2019 1618   GLUCOSEU >=500 (A) 10/24/2019 1618   HGBUR NEGATIVE 10/24/2019 1618   BILIRUBINUR NEGATIVE 10/24/2019 1618   KETONESUR 5 (A) 10/24/2019 1618   PROTEINUR 30 (A) 10/24/2019 1618   UROBILINOGEN 0.2 09/04/2014 1850   NITRITE NEGATIVE 10/24/2019 1618  LEUKOCYTESUR TRACE (A) 10/24/2019 1618   Sepsis Labs: @LABRCNTIP (procalcitonin:4,lacticidven:4)  )No results found for this or any previous visit (from the past 240 hour(s)).    Radiology Studies: No results found.   Scheduled Meds: . (feeding supplement) PROSource Plus  30 mL Oral BID BM  . atorvastatin  40 mg Oral Daily  . carvedilol  25 mg Oral BID WC  . chlorhexidine  15 mL Mouth Rinse BID  . docusate  100 mg Oral BID  . enoxaparin (LOVENOX) injection  40 mg Subcutaneous Q24H  . feeding supplement (ENSURE ENLIVE)  237 mL Oral TID BM  . hydrochlorothiazide  12.5 mg Oral Daily  . insulin aspart  0-9 Units Subcutaneous TID WC  . insulin aspart  4  Units Subcutaneous TID WC  . insulin detemir  13 Units Subcutaneous BID  . lisinopril  10 mg Oral Daily  . mouth rinse  15 mL Mouth Rinse q12n4p  . pantoprazole  40 mg Oral QHS  . polyethylene glycol  17 g Oral Daily  . senna-docusate  1 tablet Oral BID   Continuous Infusions:   LOS: 34 days   Time Spent in minutes   45 minutes  Virat Prather D.O. on 11/18/2019 at 8:20 AM  Between 7am to 7pm - Please see pager noted on [email or web address]  After 7pm go to [email or web address]  And look for the night coverage person covering for me after hours  [facility] Office  [phone]

## 2019-11-19 DIAGNOSIS — R4182 Altered mental status, unspecified: Secondary | ICD-10-CM | POA: Diagnosis not present

## 2019-11-19 DIAGNOSIS — I610 Nontraumatic intracerebral hemorrhage in hemisphere, subcortical: Secondary | ICD-10-CM | POA: Diagnosis not present

## 2019-11-19 DIAGNOSIS — E1169 Type 2 diabetes mellitus with other specified complication: Secondary | ICD-10-CM | POA: Diagnosis not present

## 2019-11-19 DIAGNOSIS — J9601 Acute respiratory failure with hypoxia: Secondary | ICD-10-CM | POA: Diagnosis not present

## 2019-11-19 LAB — BASIC METABOLIC PANEL
Anion gap: 9 (ref 5–15)
BUN: 19 mg/dL (ref 6–20)
CO2: 30 mmol/L (ref 22–32)
Calcium: 9.3 mg/dL (ref 8.9–10.3)
Chloride: 101 mmol/L (ref 98–111)
Creatinine, Ser: 0.88 mg/dL (ref 0.44–1.00)
GFR calc Af Amer: >60 mL/min (ref >60)
GFR calc non Af Amer: >60 mL/min (ref >60)
Glucose, Bld: 135 mg/dL — ABNORMAL HIGH (ref 70–99)
Potassium: 3.2 mmol/L — ABNORMAL LOW (ref 3.5–5.1)
Sodium: 140 mmol/L (ref 135–145)

## 2019-11-19 LAB — CBC
HCT: 41.1 % (ref 36.0–46.0)
Hemoglobin: 13.1 g/dL (ref 12.0–15.0)
MCH: 27.5 pg (ref 26.0–34.0)
MCHC: 31.9 g/dL (ref 30.0–36.0)
MCV: 86.2 fL (ref 80.0–100.0)
Platelets: 144 10*3/uL — ABNORMAL LOW (ref 150–400)
RBC: 4.77 MIL/uL (ref 3.87–5.11)
RDW: 13.7 % (ref 11.5–15.5)
WBC: 7.8 10*3/uL (ref 4.0–10.5)
nRBC: 0 % (ref 0.0–0.2)

## 2019-11-19 LAB — GLUCOSE, CAPILLARY
Glucose-Capillary: 135 mg/dL — ABNORMAL HIGH (ref 70–99)
Glucose-Capillary: 161 mg/dL — ABNORMAL HIGH (ref 70–99)
Glucose-Capillary: 369 mg/dL — ABNORMAL HIGH (ref 70–99)
Glucose-Capillary: 232 mg/dL — ABNORMAL HIGH (ref 70–99)
Glucose-Capillary: 298 mg/dL — ABNORMAL HIGH (ref 70–99)

## 2019-11-19 LAB — MAGNESIUM: Magnesium: 1.8 mg/dL (ref 1.7–2.4)

## 2019-11-19 MED ORDER — POTASSIUM CHLORIDE CRYS ER 20 MEQ PO TBCR
40.0000 meq | EXTENDED_RELEASE_TABLET | Freq: Once | ORAL | Status: AC
  Administered 2019-11-19: 40 meq via ORAL
  Filled 2019-11-19: qty 2

## 2019-11-19 NOTE — Progress Notes (Signed)
Occupational Therapy Treatment Patient Details Name: Latoya Cox MRN: [id number] DOB: [birth year] Today's Date: 11/19/2019    History of present illness 54 y.o. female with DM, HTN and arthritis, [name] and depressed level of consciousness with garbled speech at home, after complaining of a headache earlier in the day. STAT CT head was obtained, revealing an ICH originating from the left basal ganglila, extending into the left lateral, third and 4th ventricles, with a small amount of blood in the right lateral ventricle as well. Early stages of hydrocephalus were also noted on CT. Pt underwent L frontal IVC placement and intubation on 6/18. Pt extubated on 6/23.   OT comments  Pt making steady progress towards OT goals this session. Pt continues to present with impaired cognition in the areas of following commands, attention, memory, safety/ judgement, awareness and problem solving. Pt required cues to locate and sequence steps of oral care while standing at sink. Pt with impaired safety awareness noted to leave RW behind during mobility. Overall, pt completes household distance functional mobility with and without RW with min guard assist. DC plan remains appropriate, will follow acutely per POC.    Follow Up Recommendations  SNF    Equipment Recommendations  Other (comment)    Recommendations for Other Services      Precautions / Restrictions Precautions Precautions: Fall Precaution Comments: memory recall <10 seconds Restrictions Weight Bearing Restrictions: No       Mobility Bed Mobility Overal bed mobility: Needs Assistance Bed Mobility: Supine to Sit     Supine to sit: Supervision;HOB elevated     General bed mobility comments: supervision for safety, no physical assist needed  Transfers Overall transfer level: Needs assistance Equipment used: None Transfers: Sit to/from Stand Sit to Stand: Min guard          General transfer comment: min guard for safety as pt declined using RW when standing from EOB    Balance Overall balance assessment: Needs assistance Sitting-balance support: No upper extremity supported;Feet supported Sitting balance-Leahy Scale: Good     Standing balance support: During functional activity Standing balance-Leahy Scale: Fair Standing balance comment: able to complete dynamic ADL tasks with no UE support and close supervision                           ADL either performed or assessed with clinical judgement   ADL Overall ADL's : Needs assistance/impaired     Grooming: Oral care;Standing;Cueing for sequencing Grooming Details (indicate cue type and reason): pt noted to perseverate on grooming task needing cues to progress to next task. pt also required cues to locate needed items. pt picked up toothpaste and stated " I am looking for the toothpaste" pt also noted to need cues to problem solve what to use to brush teeth. pt appeared to be looking for a toothbrush but once OTA cued pt to brush pt did not immediately recall the purpose of a toothbrush. pt required cues to problem solve the purpose of a toothbrush                 Toilet Transfer: Min guard;Ambulation;RW Toilet Transfer Details (indicate cue type and reason): simulated via functional mobility, pt noted to foregt to use RW after completing oral care at sink         Functional mobility during ADLs: Min guard;Rolling walker General ADL Comments: pt stood at sink ~  8 mins to brush teeth noted to perseverate on oral care tasks likely d/t pts inability to sequence task.     Vision       Perception     Praxis      Cognition Arousal/Alertness: Awake/alert Behavior During Therapy: Flat affect Overall Cognitive Status: Impaired/Different from baseline Area of Impairment: Attention;Memory;Following commands;Safety/judgement;Awareness;Problem solving                    Current Attention Level: Sustained Memory: Decreased short-term memory Following Commands: Follows one step commands consistently;Follows multi-step commands inconsistently Safety/Judgement: Decreased awareness of safety;Decreased awareness of deficits Awareness: Intellectual Problem Solving: Slow processing;Decreased initiation;Difficulty sequencing;Requires verbal cues General Comments: pt required cues to sequence all steps of grooming tasks. pt noted to leave RW at sink when ambulating back to chair. pt very slow to process and flat during session        Exercises     Shoulder Instructions       General Comments cognition appears to be most limiting factor    Pertinent Vitals/ Pain       Pain Assessment: No/denies pain  Home Living                                          Prior Functioning/Environment              Frequency  Min 2X/week        Progress Toward Goals  OT Goals(current goals can now be found in the care plan section)  Progress towards OT goals: Progressing toward goals  Acute Rehab OT Goals Patient Stated Goal: did not state - wants to be in the bed OT Goal Formulation: With patient Time For Goal Achievement: 11/25/19 Potential to Achieve Goals: Good  Plan Discharge plan remains appropriate;Frequency remains appropriate    Co-evaluation                 AM-PAC OT "6 Clicks" Daily Activity     Outcome Measure   Help from another person eating meals?: A Little Help from another person taking care of personal grooming?: A Little Help from another person toileting, which includes using toliet, bedpan, or urinal?: A Little Help from another person bathing (including washing, rinsing, drying)?: A Little Help from another person to put on and taking off regular upper body clothing?: A Little Help from another person to put on and taking off regular lower body clothing?: A Little 6 Click Score: 18    End of Session  Equipment Utilized During Treatment: Rolling walker  OT Visit Diagnosis: Other abnormalities of gait and mobility (R26.89);Hemiplegia and hemiparesis;Other symptoms and signs involving the nervous system (R29.898) Hemiplegia - Right/Left: Right Hemiplegia - dominant/non-dominant: Dominant Hemiplegia - caused by: Nontraumatic intracerebral hemorrhage   Activity Tolerance Patient tolerated treatment well   Patient Left in chair;with call bell/phone within reach;with chair alarm set   Nurse Communication Mobility status        Time: 0258-5277 OT Time Calculation (min): 18 min  Charges: OT General Charges $OT Visit: 1 Visit OT Treatments $Self Care/Home Management : 8-22 mins  Audery Amel., COTA/L Acute Rehabilitation Services [phone]    Angelina Pih 11/19/2019, 9:40 AM

## 2019-11-19 NOTE — Progress Notes (Signed)
PROGRESS NOTE    Latoya Cox  FKC:[id number] DOB: 12-Nov-1965 DOA: 10/15/2019 PCP: Myrlene Broker, MD   Brief Narrative:  HPI on 10/15/2019 by Dr. Caryl Pina (neuro) Latoya Cox is an 54 y.o. female with DM, HTN and arthritis, [name] and depressed level of consciousness with garbled speech at home, after complaining of a headache earlier in the day. Husband told EMS that the patient had been complaining of a headache since the morning, but was with no deficits at that time. At 4:30 PM, she continued to complain about her headache and she also felt sleepy, so she took a nap. Her husband checked on her at 6:30 PM, noted the right sided weakness and depressed level of consciousness, and called EMS. On EMS arrival, they noted the same, as well as right facial droop. She vomited en route and continued to vomit intermittently on arrival to the ED. She was obtunded to somnolent and unable to answer questions intelligibly. STAT CT head was obtained, revealing an ICH originating from the left basal ganglila, extending into the left lateral, third and 4th ventricles, with a small amount of blood in the right lateral ventricle as well. Early stages of hydrocephalus were also noted on CT.   Interim history She was admitted to the ICU by the Neuro Hospitalist team.  She was seen by Neurosurgery and an EVD was placed.  She remained in the ICU for a prolonged period on ventilator support but ultimately was able to be weaned and transferred out to [facility] on hospital day #9, June 27.  Physical therapy has recommended SNF placement and a bed search is currently underway. Assessment & Plan   Left thalamic intracranial intraventricular hemorrhage -On admission showed acute ICH left thalamus and intraventricular penetration, early dilation of lateral ventricles -Initially admitted by neurology -MRI not performed due to prior aneurysm  clips -EVD placed while in ICU with improvement clinically -CT head on October 22, 2019 confirmed interval improvement -Echocardiogram EF 60-65%, grade 1 diastolic dysfunction, impaired relaxation -Carotid Doppler showed bilateral ICA stenosis 1 to 39%, left vertebral artery demonstrates antegrade flow.  Right vertebral artery was not visualized. -PT/OT recommending SNF  Obstructive hydrocephalus -Status post EVD.  EVD was self removed on October 22, 2019 with no acute issues afterward  Acute hypoxic respiratory failure -Resolved, suspect neurologic in etiology  Acute metabolic encephalopathy -unlikely due to infection -suspect secondary to the above  Hypomagnesemia -Currently 1.8  Severe hypokalemia -last checked on 7/19, was 3.2 -Will replace and continue to monitor   Essential hypertension -Continue Coreg, hydrochlorothiazide, lisinopril  Dysphagia -Speech therapy consulted, currently on regular diet without difficulty  Diabetes mellitus, type II uncontrolled with hyperglycemia -Hemoglobin A1c 9.1 -Continue to monitor CBG, continue insulin sliding scale with premeal insulin and Levemir  PSVT -Asymptomatic, continue Coreg  Hyperlipidemia -LDL 186 -Continue statin  Normocytic anemia -Likely anemia of critical illness/poor nutrition and acute illness phase, no gross blood loss -Hemoglobin currently 13.1 -Continue to monitor H&H  DVT Prophylaxis  lovenox  Code Status: Full  Family Communication: None at bedside  Disposition Plan:  Status is: Inpatient  Remains inpatient appropriate because:Altered mental status and Unsafe d/c plan. Patient continues to be alert to self only.   Dispo:  Patient From: Home  Planned Disposition: Skilled Nursing Facility  Expected discharge date:  1-2 days  Medically stable for discharge: Yes   Consultants Neurology Neurosurgery PCCM PMR  Procedures  Echocardiogram Carotid Doppler  Antibiotics   Anti-infectives (From  admission, onward)   None      Subjective:   Latoya Cox seen and examined today.  Patient feels well this morning.  Denies chest pain or shortness of breath, abdominal pain, nausea or vomiting, diarrhea constipation, dizziness or headache.  States that she is in the hospital and trying to get better.    Objective:   Vitals:   11/18/19 1917 11/18/19 2320 11/19/19 0333 11/19/19 0738  BP: 122/80 (!) 159/91 (!) 147/90 (!) 149/99  Pulse: 87 89 91 91  Resp: 18 18 18 18   Temp: 98 F (36.7 C) 99.2 F (37.3 C) 99.3 F (37.4 C) 98.4 F (36.9 C)  TempSrc: Oral Oral Oral Oral  SpO2: 100% 100% 100% 99%  Weight:      Height:        Intake/Output Summary (Last 24 hours) at 11/19/2019 1136 Last data filed at 11/19/2019 0849 Gross per 24 hour  Intake 780 ml  Output --  Net 780 ml   Filed Weights   11/10/19 0352 11/11/19 0325 11/12/19 0323  Weight: 73.5 kg 73.6 kg 73 kg   Exam  General: Well developed, well nourished, NAD, appears stated age  HEENT: NCAT, mucous membranes moist.   Cardiovascular: S1 S2 auscultated, RRR, no murmur  Respiratory: Clear to auscultation bilaterally   Abdomen: Soft, nontender, nondistended, + bowel sounds  Extremities: warm dry without cyanosis clubbing or edema  Neuro: AAOx2 (name, place), nonfocal  Psych: Appropriate mood and affect  Data Reviewed: I have personally reviewed following labs and imaging studies  CBC: Recent Labs  Lab 11/15/19 0506 11/19/19 0430  WBC 7.4 7.8  HGB 12.6 13.1  HCT 39.7 41.1  MCV 86.5 86.2  PLT 135* 144*   Basic Metabolic Panel: Recent Labs  Lab 11/15/19 0506 11/16/19 0828 11/19/19 0430  NA 139 143 140  K 2.8* 3.9 3.2*  CL 101 108 101  CO2 29 24 30   GLUCOSE 110* 141* 135*  BUN 12 14 19   CREATININE 0.83 0.95 0.88  CALCIUM 9.2 9.0 9.3  MG 1.9  --  1.8   GFR: Estimated Creatinine Clearance: 70 mL/min (by C-G formula based on SCr of 0.88 mg/dL). Liver Function Tests: No results for input(s):  AST, ALT, ALKPHOS, BILITOT, PROT, ALBUMIN in the last 168 hours. No results for input(s): LIPASE, AMYLASE in the last 168 hours. No results for input(s): AMMONIA in the last 168 hours. Coagulation Profile: No results for input(s): INR, PROTIME in the last 168 hours. Cardiac Enzymes: No results for input(s): CKTOTAL, CKMB, CKMBINDEX, TROPONINI in the last 168 hours. BNP (last 3 results) No results for input(s): PROBNP in the last 8760 hours. HbA1C: No results for input(s): HGBA1C in the last 72 hours. CBG: Recent Labs  Lab 11/18/19 0629 11/18/19 1125 11/18/19 1622 11/18/19 2109 11/19/19 0610  GLUCAP 135* 162* 228* 213* 135*   Lipid Profile: No results for input(s): CHOL, HDL, LDLCALC, TRIG, CHOLHDL, LDLDIRECT in the last 72 hours. Thyroid Function Tests: No results for input(s): TSH, T4TOTAL, FREET4, T3FREE, THYROIDAB in the last 72 hours. Anemia Panel: No results for input(s): VITAMINB12, FOLATE, FERRITIN, TIBC, IRON, RETICCTPCT in the last 72 hours. Urine analysis:    Component Value Date/Time   COLORURINE YELLOW 10/24/2019 1618   APPEARANCEUR CLEAR 10/24/2019 1618   LABSPEC 1.029 10/24/2019 1618   PHURINE 5.0 10/24/2019 1618   GLUCOSEU >=500 (A) 10/24/2019 1618   HGBUR NEGATIVE 10/24/2019 1618   BILIRUBINUR NEGATIVE  10/24/2019 1618   KETONESUR 5 (A) 10/24/2019 1618   PROTEINUR 30 (A) 10/24/2019 1618   UROBILINOGEN 0.2 09/04/2014 1850   NITRITE NEGATIVE 10/24/2019 1618   LEUKOCYTESUR TRACE (A) 10/24/2019 1618   Sepsis Labs: @LABRCNTIP (procalcitonin:4,lacticidven:4)  )No results found for this or any previous visit (from the past 240 hour(s)).    Radiology Studies: No results found.   Scheduled Meds:  (feeding supplement) PROSource Plus  30 mL Oral BID BM   atorvastatin  40 mg Oral Daily   carvedilol  25 mg Oral BID WC   chlorhexidine  15 mL Mouth Rinse BID   docusate  100 mg Oral BID   enoxaparin (LOVENOX) injection  40 mg Subcutaneous Q24H    feeding supplement (ENSURE ENLIVE)  237 mL Oral TID BM   hydrochlorothiazide  12.5 mg Oral Daily   insulin aspart  0-9 Units Subcutaneous TID WC   insulin aspart  4 Units Subcutaneous TID WC   insulin detemir  13 Units Subcutaneous BID   lisinopril  10 mg Oral Daily   mouth rinse  15 mL Mouth Rinse q12n4p   pantoprazole  40 mg Oral QHS   polyethylene glycol  17 g Oral Daily   senna-docusate  1 tablet Oral BID   Continuous Infusions:   LOS: 35 days   Time Spent in minutes   30 minutes  Latoya Cox D.O. on 11/19/2019 at 11:36 AM  Between 7am to 7pm - Please see pager noted on [email or web address]  After 7pm go to [email or web address]  And look for the night coverage person covering for me after hours  [facility] Office  [phone]

## 2019-11-20 DIAGNOSIS — R4182 Altered mental status, unspecified: Secondary | ICD-10-CM | POA: Diagnosis not present

## 2019-11-20 DIAGNOSIS — J9601 Acute respiratory failure with hypoxia: Secondary | ICD-10-CM | POA: Diagnosis not present

## 2019-11-20 DIAGNOSIS — I610 Nontraumatic intracerebral hemorrhage in hemisphere, subcortical: Secondary | ICD-10-CM | POA: Diagnosis not present

## 2019-11-20 DIAGNOSIS — E1169 Type 2 diabetes mellitus with other specified complication: Secondary | ICD-10-CM | POA: Diagnosis not present

## 2019-11-20 LAB — GLUCOSE, CAPILLARY
Glucose-Capillary: 143 mg/dL — ABNORMAL HIGH (ref 70–99)
Glucose-Capillary: 137 mg/dL — ABNORMAL HIGH (ref 70–99)
Glucose-Capillary: 189 mg/dL — ABNORMAL HIGH (ref 70–99)
Glucose-Capillary: 116 mg/dL — ABNORMAL HIGH (ref 70–99)
Glucose-Capillary: 309 mg/dL — ABNORMAL HIGH (ref 70–99)

## 2019-11-20 LAB — BASIC METABOLIC PANEL
Anion gap: 12 (ref 5–15)
BUN: 25 mg/dL — ABNORMAL HIGH (ref 6–20)
CO2: 27 mmol/L (ref 22–32)
Calcium: 9.4 mg/dL (ref 8.9–10.3)
Chloride: 100 mmol/L (ref 98–111)
Creatinine, Ser: 1.21 mg/dL — ABNORMAL HIGH (ref 0.44–1.00)
GFR calc Af Amer: 60 mL/min — ABNORMAL LOW (ref >60)
GFR calc non Af Amer: 51 mL/min — ABNORMAL LOW (ref >60)
Glucose, Bld: 254 mg/dL — ABNORMAL HIGH (ref 70–99)
Potassium: 3.1 mmol/L — ABNORMAL LOW (ref 3.5–5.1)
Sodium: 139 mmol/L (ref 135–145)

## 2019-11-20 NOTE — Progress Notes (Signed)
PROGRESS NOTE    Latoya Cox  EKC:[id number] DOB: [birth year] DOA: 10/15/2019 PCP: Myrlene Broker, MD   Brief Narrative:  HPI on 10/15/2019 by Dr. Caryl Pina (neuro) Latoya Cox is an 54 y.o. female with DM, HTN and arthritis, [name] and depressed level of consciousness with garbled speech at home, after complaining of a headache earlier in the day. Husband told EMS that the patient had been complaining of a headache since the morning, but was with no deficits at that time. At 4:30 PM, she continued to complain about her headache and she also felt sleepy, so she took a nap. Her husband checked on her at 6:30 PM, noted the right sided weakness and depressed level of consciousness, and called EMS. On EMS arrival, they noted the same, as well as right facial droop. She vomited en route and continued to vomit intermittently on arrival to the ED. She was obtunded to somnolent and unable to answer questions intelligibly. STAT CT head was obtained, revealing an ICH originating from the left basal ganglila, extending into the left lateral, third and 4th ventricles, with a small amount of blood in the right lateral ventricle as well. Early stages of hydrocephalus were also noted on CT.   Interim history She was admitted to the ICU by the Neuro Hospitalist team.  She was seen by Neurosurgery and an EVD was placed.  She remained in the ICU for a prolonged period on ventilator support but ultimately was able to be weaned and transferred out to [facility] on hospital day #9, June 27.  Physical therapy has recommended SNF placement and a bed search is currently underway. Assessment & Plan   Left thalamic intracranial intraventricular hemorrhage -On admission showed acute ICH left thalamus and intraventricular penetration, early dilation of lateral ventricles -Initially admitted by neurology -MRI not performed due to prior aneurysm  clips -EVD placed while in ICU with improvement clinically -CT head on October 22, 2019 confirmed interval improvement -Echocardiogram EF 60-65%, grade 1 diastolic dysfunction, impaired relaxation -Carotid Doppler showed bilateral ICA stenosis 1 to 39%, left vertebral artery demonstrates antegrade flow.  Right vertebral artery was not visualized. -PT/OT recommending SNF  Obstructive hydrocephalus -Status post EVD.  EVD was self removed on October 22, 2019 with no acute issues afterward  Acute hypoxic respiratory failure -Resolved, suspect neurologic in etiology  Acute metabolic encephalopathy -unlikely due to infection -suspect secondary to the above  Hypomagnesemia -Currently 1.8  Severe hypokalemia -last checked on 7/19, was 3.2 -Replaced, pending labs today  Essential hypertension -Stable, Continue Coreg, hydrochlorothiazide, lisinopril  Dysphagia -Speech therapy consulted, currently on regular diet without difficulty  Diabetes mellitus, type II uncontrolled with hyperglycemia -Hemoglobin A1c 9.1 -Continue to monitor CBG, continue insulin sliding scale with premeal insulin and Levemir  PSVT -Asymptomatic, continue Coreg  Hyperlipidemia -LDL 186 -Continue statin  Normocytic anemia -Likely anemia of critical illness/poor nutrition and acute illness phase, no gross blood loss -Hemoglobin currently 13.1 -Continue to monitor H&H  DVT Prophylaxis  lovenox  Code Status: Full  Family Communication: None at bedside  Disposition Plan:  Status is: Inpatient  Remains inpatient appropriate because:Altered mental status and Unsafe d/c plan. Patient continues to be alert to self only.   Dispo:  Patient From: Home  Planned Disposition: Skilled Nursing Facility  Expected discharge date:  1-2 days  Medically stable for discharge: Yes   Consultants Neurology Neurosurgery PCCM PMR  Procedures  Echocardiogram Carotid  Doppler  Antibiotics   Anti-infectives (From  admission, onward)   None      Subjective:   [name] seen and examined today.  No complaints today. Feels tired. Denies chest pain, shortness of breath, abdominal pain, N/V/D/C.   Objective:   Vitals:   11/19/19 1900 11/19/19 2300 11/20/19 0300 11/20/19 0757  BP: 114/80 (!) 145/95 (!) 129/96 (!) 151/88  Pulse: 98  93 86  Resp: 18 20 17 18   Temp: 98.6 F (37 C) 98.9 F (37.2 C) 98.2 F (36.8 C) 98.4 F (36.9 C)  TempSrc: Oral Oral Oral Oral  SpO2:  100% 100% 100%  Weight:      Height:        Intake/Output Summary (Last 24 hours) at 11/20/2019 1009 Last data filed at 11/19/2019 1700 Gross per 24 hour  Intake 560 ml  Output --  Net 560 ml   Filed Weights   11/10/19 0352 11/11/19 0325 11/12/19 0323  Weight: 73.5 kg 73.6 kg 73 kg   Exam  General: Well developed, well nourished, NAD, appears stated age  HEENT: NCAT, mucous membranes moist.   Cardiovascular: S1 S2 auscultated, RRR, no murmur  Respiratory: Clear to auscultation bilaterally   Abdomen: Soft, nontender, nondistended, + bowel sounds  Extremities: warm dry without cyanosis clubbing or edema  Neuro: AAOx2 (name, place), nonfocal  Psych: Appropriate and pleasant  Data Reviewed: I have personally reviewed following labs and imaging studies  CBC: Recent Labs  Lab 11/15/19 0506 11/19/19 0430  WBC 7.4 7.8  HGB 12.6 13.1  HCT 39.7 41.1  MCV 86.5 86.2  PLT 135* 144*   Basic Metabolic Panel: Recent Labs  Lab 11/15/19 0506 11/16/19 0828 11/19/19 0430  NA 139 143 140  K 2.8* 3.9 3.2*  CL 101 108 101  CO2 29 24 30   GLUCOSE 110* 141* 135*  BUN 12 14 19   CREATININE 0.83 0.95 0.88  CALCIUM 9.2 9.0 9.3  MG 1.9  --  1.8   GFR: Estimated Creatinine Clearance: 70 mL/min (by C-G formula based on SCr of 0.88 mg/dL). Liver Function Tests: No results for input(s): AST, ALT, ALKPHOS, BILITOT, PROT, ALBUMIN in the last 168 hours. No results for input(s): LIPASE, AMYLASE in the last 168  hours. No results for input(s): AMMONIA in the last 168 hours. Coagulation Profile: No results for input(s): INR, PROTIME in the last 168 hours. Cardiac Enzymes: No results for input(s): CKTOTAL, CKMB, CKMBINDEX, TROPONINI in the last 168 hours. BNP (last 3 results) No results for input(s): PROBNP in the last 8760 hours. HbA1C: No results for input(s): HGBA1C in the last 72 hours. CBG: Recent Labs  Lab 11/19/19 1429 11/19/19 1623 11/19/19 2112 11/20/19 0614 11/20/19 0754  GLUCAP 369* 232* 298* 143* 137*   Lipid Profile: No results for input(s): CHOL, HDL, LDLCALC, TRIG, CHOLHDL, LDLDIRECT in the last 72 hours. Thyroid Function Tests: No results for input(s): TSH, T4TOTAL, FREET4, T3FREE, THYROIDAB in the last 72 hours. Anemia Panel: No results for input(s): VITAMINB12, FOLATE, FERRITIN, TIBC, IRON, RETICCTPCT in the last 72 hours. Urine analysis:    Component Value Date/Time   COLORURINE YELLOW 10/24/2019 1618   APPEARANCEUR CLEAR 10/24/2019 1618   LABSPEC 1.029 10/24/2019 1618   PHURINE 5.0 10/24/2019 1618   GLUCOSEU >=500 (A) 10/24/2019 1618   HGBUR NEGATIVE 10/24/2019 1618   BILIRUBINUR NEGATIVE 10/24/2019 1618   KETONESUR 5 (A) 10/24/2019 1618   PROTEINUR 30 (A) 10/24/2019 1618   UROBILINOGEN 0.2 09/04/2014 1850   NITRITE NEGATIVE  10/24/2019 1618   LEUKOCYTESUR TRACE (A) 10/24/2019 1618   Sepsis Labs: @LABRCNTIP (procalcitonin:4,lacticidven:4)  )No results found for this or any previous visit (from the past 240 hour(s)).    Radiology Studies: No results found.   Scheduled Meds: . (feeding supplement) PROSource Plus  30 mL Oral BID BM  . atorvastatin  40 mg Oral Daily  . carvedilol  25 mg Oral BID WC  . chlorhexidine  15 mL Mouth Rinse BID  . docusate  100 mg Oral BID  . enoxaparin (LOVENOX) injection  40 mg Subcutaneous Q24H  . feeding supplement (ENSURE ENLIVE)  237 mL Oral TID BM  . hydrochlorothiazide  12.5 mg Oral Daily  . insulin aspart  0-9 Units  Subcutaneous TID WC  . insulin aspart  4 Units Subcutaneous TID WC  . insulin detemir  13 Units Subcutaneous BID  . lisinopril  10 mg Oral Daily  . mouth rinse  15 mL Mouth Rinse q12n4p  . pantoprazole  40 mg Oral QHS  . polyethylene glycol  17 g Oral Daily  . senna-docusate  1 tablet Oral BID   Continuous Infusions:   LOS: 36 days   Time Spent in minutes   30 minutes  Kohner Orlick D.O. on 11/20/2019 at 10:09 AM  Between 7am to 7pm - Please see pager noted on [email or web address]  After 7pm go to [email or web address]  And look for the night coverage person covering for me after hours  [facility] Office  [phone]

## 2019-11-20 NOTE — TOC Progression Note (Addendum)
Transition of Care [facility]) - Progression Note    Patient Details  Name: Latoya Cox MRN: [id number] Date of Birth: [birth year]  Transition of Care [facility]) CM/SW Contact  Baldemar Lenis, [place] Phone Number: 11/20/2019, 3:29 PM  Clinical Narrative:   CSW reached out to [facility] to see if they would consider patient under LOG. Awaiting response. CSW contacted patient's husband to update, left a voicemail.  UPDATE: [place] has offered a bed for patient under LOG. CSW to update husband when he calls back. [facility] can accept the patient on Monday.    Expected Discharge Plan: Skilled Nursing Facility Barriers to Discharge: Inadequate or no insurance, SNF Pending bed offer  Expected Discharge Plan and Services Expected Discharge Plan: Skilled Nursing Facility In-house Referral: Clinical Social Work   Post Acute Care Choice: Skilled Nursing Facility Living arrangements for the past 2 months: Single Family Home                                       Social Determinants of Health (SDOH) Interventions    Readmission Risk Interventions Readmission Risk Prevention Plan 10/29/2019  Transportation Screening Complete  Home Care Screening Complete  Some recent data might be hidden

## 2019-11-20 NOTE — Progress Notes (Signed)
Occupational Therapy Treatment Patient Details Name: Latoya Cox MRN: [id number] DOB: 28-Nov-1965 Today's Date: 11/20/2019    History of present illness 54 y.o. female with DM, HTN and arthritis, [name] and depressed level of consciousness with garbled speech at home, after complaining of a headache earlier in the day. STAT CT head was obtained, revealing an ICH originating from the left basal ganglila, extending into the left lateral, third and 4th ventricles, with a small amount of blood in the right lateral ventricle as well. Early stages of hydrocephalus were also noted on CT. Pt underwent L frontal IVC placement and intubation on 6/18. Pt extubated on 6/23.   OT comments  Pt making steady progress towards OT goals this session. Pt sitting EOB upon arrival asleep on bed side table. Pt continues to be flat during session with moments of wakefulness and moments of lethargy. Cognition appears to be the most limiting factor as pt unable to recall any new information during session. Pt required max multimodal cues to problem solve how to call nurse. Continue to recommend SNF for DC, will follow acutely per POC.    Follow Up Recommendations  SNF    Equipment Recommendations  Other (comment)    Recommendations for Other Services      Precautions / Restrictions Precautions Precautions: Fall Precaution Comments: memory recall <10 seconds Restrictions Weight Bearing Restrictions: No       Mobility Bed Mobility Overal bed mobility: Needs Assistance Bed Mobility: Sit to Supine       Sit to supine: Supervision   General bed mobility comments: supervision for safety, no physical assist needed  Transfers Overall transfer level: Needs assistance Equipment used: None Transfers: Sit to/from Stand Sit to Stand: Min guard         General transfer comment: min guard for safety as pt declined using RW when standing from  EOB    Balance Overall balance assessment: Needs assistance Sitting-balance support: No upper extremity supported;Feet supported Sitting balance-Leahy Scale: Good     Standing balance support: No upper extremity supported;During functional activity Standing balance-Leahy Scale: Fair Standing balance comment: pt able to complete short distance mobility in room with no UE support with min guard                           ADL either performed or assessed with clinical judgement   ADL Overall ADL's : Needs assistance/impaired                         Toilet Transfer: Min guard;Ambulation Toilet Transfer Details (indicate cue type and reason): simulated via functional mobility with min guard and no AD         Functional mobility during ADLs: Min guard General ADL Comments: pt sitting at end of bed asleep on bed side table upon arrival. session focus on cognition     Vision       Perception     Praxis      Cognition Arousal/Alertness: Awake/alert;Lethargic (moments of wakefulness and moments of lethargy) Behavior During Therapy: Flat affect Overall Cognitive Status: Impaired/Different from baseline Area of Impairment: Orientation;Attention;Memory;Following commands;Safety/judgement;Awareness                 Orientation Level: Disoriented to;Time;Situation (pt unaware of date" its tuesday" oriented pt to date written on board with pt unable to later recall later in session.)  Current Attention Level: Sustained Memory: Decreased short-term memory Following Commands: Follows one step commands consistently;Follows multi-step commands inconsistently Safety/Judgement: Decreased awareness of deficits Awareness: Intellectual Problem Solving: Slow processing;Decreased initiation;Difficulty sequencing;Requires verbal cues General Comments: pt states " its blue and white" when asked the day of the week. worked on problem solving during session, asked pt how to  call RN. pt reuired increased time, elimination of all distractions, and MAX multimodal cues for pt to locate call button on remote. Asked pt what types of things she may need the nurse for pt states, " me and you situations"        Exercises     Shoulder Instructions       General Comments      Pertinent Vitals/ Pain       Pain Assessment: Faces Faces Pain Scale: Hurts a little bit Pain Location: generlized upon standing Pain Descriptors / Indicators: Tightness Pain Intervention(s): Monitored during session  Home Living                                          Prior Functioning/Environment              Frequency  Min 2X/week        Progress Toward Goals  OT Goals(current goals can now be found in the care plan section)  Progress towards OT goals: Progressing toward goals  Acute Rehab OT Goals Patient Stated Goal: did not state - wants to be in the bed OT Goal Formulation: With patient Time For Goal Achievement: 11/25/19 Potential to Achieve Goals: Good  Plan Discharge plan remains appropriate;Frequency remains appropriate    Co-evaluation                 AM-PAC OT "6 Clicks" Daily Activity     Outcome Measure   Help from another person eating meals?: A Little Help from another person taking care of personal grooming?: A Little Help from another person toileting, which includes using toliet, bedpan, or urinal?: A Little Help from another person bathing (including washing, rinsing, drying)?: A Little Help from another person to put on and taking off regular upper body clothing?: A Little Help from another person to put on and taking off regular lower body clothing?: A Little 6 Click Score: 18    End of Session    OT Visit Diagnosis: Other abnormalities of gait and mobility (R26.89);Hemiplegia and hemiparesis;Other symptoms and signs involving the nervous system (R29.898) Hemiplegia - Right/Left: Right Hemiplegia -  dominant/non-dominant: Dominant Hemiplegia - caused by: Nontraumatic intracerebral hemorrhage   Activity Tolerance Patient tolerated treatment well   Patient Left in bed;with call bell/phone within reach;with bed alarm set   Nurse Communication Mobility status        Time: 2229-7989 OT Time Calculation (min): 11 min  Charges: OT General Charges $OT Visit: 1 Visit OT Treatments $Therapeutic Activity: 8-22 mins Audery Amel., COTA/L Acute Rehabilitation Services [phone]    Angelina Pih 11/20/2019, 11:49 AM

## 2019-11-20 NOTE — Progress Notes (Signed)
Physical Therapy Treatment Patient Details Name: Latoya Cox MRN: [id number] DOB: 01-02-1966 Today's Date: 11/20/2019    History of Present Illness 54 y.o. female with DM, HTN and arthritis, [name] and depressed level of consciousness with garbled speech at home, after complaining of a headache earlier in the day. STAT CT head was obtained, revealing an ICH originating from the left basal ganglila, extending into the left lateral, third and 4th ventricles, with a small amount of blood in the right lateral ventricle as well. Early stages of hydrocephalus were also noted on CT. Pt underwent L frontal IVC placement and intubation on 6/18. Pt extubated on 6/23.  Pt is progressing towards goals. Pt had difficulty with short term memory, following simple commands, and sequencing. Pt is supervision for transfers and min guard to min assist with ambulation. Pt with frequent LOB that require min assist for correction when performing dynamic gait tasks. Recommendation for SNF level therapy is still appropriate. Will continue to follow acutely.   PT Comments      Follow Up Recommendations  SNF     Equipment Recommendations  Rolling walker with 5" wheels;3in1 (PT)    Recommendations for Other Services       Precautions / Restrictions Precautions Precautions: Fall Precaution Comments: memory recall <10 seconds Restrictions Weight Bearing Restrictions: No    Mobility  Bed Mobility               General bed mobility comments: pt was sitting at the EOB eating upon arrival in room  Transfers Overall transfer level: Needs assistance Equipment used: None Transfers: Sit to/from Stand Sit to Stand: Supervision         General transfer comment: supervision for safety was required, pt required increase time with power-up with sit<>stand  Ambulation/Gait Ambulation/Gait assistance: Min guard;Min assist Gait Distance (Feet):  250 Feet Assistive device: None Gait Pattern/deviations: Step-through pattern;Narrow base of support;Decreased step length - right;Decreased step length - left;Decreased stride length Gait velocity: decreased   General Gait Details: very slow and unsteady gait without UE support. pt had the occassional LOB, particularly with weaving, turning a corner and head turns requiring min assist to recover.    Stairs             Wheelchair Mobility    Modified Rankin (Stroke Patients Only)       Balance Overall balance assessment: Needs assistance Sitting-balance support: No upper extremity supported;Feet supported Sitting balance-Leahy Scale: Good Sitting balance - Comments: pt was able to sit at EOB and eat her food without difficulty    Standing balance support: No upper extremity supported;During functional activity Standing balance-Leahy Scale: Fair Standing balance comment: pt able to maintain static standing balance without UE support; however, required external assist with dynamic tasks   Single Leg Stance - Left Leg: 5         High level balance activites: Direction changes;Sudden stops;Head turns High Level Balance Comments: pt able to maintain static standing balance with a narrow BOS and UE extremity movement. Pt was cued multimodally to semi-tandem stance but was unable to achieve position mostly due to cognitive deficits. Pt able ambulate with head turns horizontally and vertically; however, often sustained a LOB. Pt unable to complete stepping over object with multimodal cuing, possibly more attributed to cognitive deficits. Pt able to move around objects with demonstration cuing, minimal LOB. Pt required increased time with change of direction and sudden stop Standardized  Balance Assessment Standardized Balance Assessment : Dynamic Gait Index   Dynamic Gait Index Level Surface: Moderate Impairment Change in Gait Speed: Moderate Impairment Gait with Horizontal Head  Turns: Moderate Impairment Gait with Vertical Head Turns: Moderate Impairment Gait and Pivot Turn: Mild Impairment Step Over Obstacle:  (pt unable to perform due to cognitive deficits) Step Around Obstacles: Severe Impairment (pt required demonstration and verbal cue)      Cognition Arousal/Alertness: Awake/alert Behavior During Therapy: Flat affect Overall Cognitive Status: Impaired/Different from baseline Area of Impairment: Memory;Following commands;Awareness;Problem solving                     Memory: Decreased short-term memory Following Commands: Follows one step commands consistently;Follows multi-step commands inconsistently;Follows one step commands with increased time   Awareness: Intellectual Problem Solving: Slow processing;Decreased initiation;Difficulty sequencing;Requires verbal cues General Comments: pt unable to remember therapist names after 20 second recall. Pt unable to remember room # and the location of room, she knew the general location and required max cuing. Pt unable to recall how to find the nurses station on walk down the hall.       Exercises Other Exercises Other Exercises: Standing balance with Narrow BOS - 15 seconds,  Other Exercises: standing balance with narrow BOS and head turns - 3 ea Other Exercises: standing balance with shoulder flexion - 15 seconds Other Exercises: walking and head turns with room # reading task. pt required max multimodal cues and was unable to recognize "w" and called it "0"    General Comments        Pertinent Vitals/Pain Pain Assessment: No/denies pain    Home Living                      Prior Function            PT Goals (current goals can now be found in the care plan section) Acute Rehab PT Goals Patient Stated Goal: did not state  PT Goal Formulation: With patient Time For Goal Achievement: 11/26/19 Potential to Achieve Goals: Fair Progress towards PT goals: Progressing toward goals     Frequency    Min 3X/week      PT Plan Current plan remains appropriate    Co-evaluation              AM-PAC PT "6 Clicks" Mobility   Outcome Measure  Help needed turning from your back to your side while in a flat bed without using bedrails?: None Help needed moving from lying on your back to sitting on the side of a flat bed without using bedrails?: None Help needed moving to and from a bed to a chair (including a wheelchair)?: A Little Help needed standing up from a chair using your arms (e.g., wheelchair or bedside chair)?: None Help needed to walk in hospital room?: A Little Help needed climbing 3-5 steps with a railing? : A Lot 6 Click Score: 20    End of Session Equipment Utilized During Treatment: Gait belt Activity Tolerance: Patient tolerated treatment well Patient left: in bed;with call Adalid Beckmann/phone within reach;with bed alarm set;Other (comment) (sitting EOB) Nurse Communication: Mobility status PT Visit Diagnosis: Other abnormalities of gait and mobility (R26.89);Unsteadiness on feet (R26.81)     Time: 5809-9833 PT Time Calculation (min) (ACUTE ONLY): 17 min  Charges:  $Gait Training: 8-22 mins                    Harmon Pier, SPT  Acute Rehabilitation Services  Office: [phone]  {11/20/2019, 6:39 PM

## 2019-11-21 DIAGNOSIS — R4182 Altered mental status, unspecified: Secondary | ICD-10-CM | POA: Diagnosis not present

## 2019-11-21 DIAGNOSIS — J9601 Acute respiratory failure with hypoxia: Secondary | ICD-10-CM | POA: Diagnosis not present

## 2019-11-21 DIAGNOSIS — E1169 Type 2 diabetes mellitus with other specified complication: Secondary | ICD-10-CM | POA: Diagnosis not present

## 2019-11-21 DIAGNOSIS — I610 Nontraumatic intracerebral hemorrhage in hemisphere, subcortical: Secondary | ICD-10-CM | POA: Diagnosis not present

## 2019-11-21 LAB — GLUCOSE, CAPILLARY
Glucose-Capillary: 120 mg/dL — ABNORMAL HIGH (ref 70–99)
Glucose-Capillary: 156 mg/dL — ABNORMAL HIGH (ref 70–99)
Glucose-Capillary: 269 mg/dL — ABNORMAL HIGH (ref 70–99)
Glucose-Capillary: 179 mg/dL — ABNORMAL HIGH (ref 70–99)

## 2019-11-21 MED ORDER — POTASSIUM CHLORIDE CRYS ER 20 MEQ PO TBCR
40.0000 meq | EXTENDED_RELEASE_TABLET | ORAL | Status: AC
  Administered 2019-11-21 (×2): 40 meq via ORAL
  Filled 2019-11-21 (×2): qty 2

## 2019-11-21 NOTE — Progress Notes (Signed)
PROGRESS NOTE    Latoya Cox  OIB:[id number] DOB: [birth year] DOA: 10/15/2019 PCP: Myrlene Broker, MD   Brief Narrative:  HPI on 10/15/2019 by Dr. Caryl Pina (neuro) Latoya Cox is an 54 y.o. female with DM, HTN and arthritis, [name] and depressed level of consciousness with garbled speech at home, after complaining of a headache earlier in the day. Husband told EMS that the patient had been complaining of a headache since the morning, but was with no deficits at that time. At 4:30 PM, she continued to complain about her headache and she also felt sleepy, so she took a nap. Her husband checked on her at 6:30 PM, noted the right sided weakness and depressed level of consciousness, and called EMS. On EMS arrival, they noted the same, as well as right facial droop. She vomited en route and continued to vomit intermittently on arrival to the ED. She was obtunded to somnolent and unable to answer questions intelligibly. STAT CT head was obtained, revealing an ICH originating from the left basal ganglila, extending into the left lateral, third and 4th ventricles, with a small amount of blood in the right lateral ventricle as well. Early stages of hydrocephalus were also noted on CT.   Interim history She was admitted to the ICU by the Neuro Hospitalist team.  She was seen by Neurosurgery and an EVD was placed.  She remained in the ICU for a prolonged period on ventilator support but ultimately was able to be weaned and transferred out to [facility] on hospital day #9, June 27.  Physical therapy has recommended SNF placement and a bed search is currently underway. Assessment & Plan   Left thalamic intracranial intraventricular hemorrhage -On admission showed acute ICH left thalamus and intraventricular penetration, early dilation of lateral ventricles -Initially admitted by neurology -MRI not performed due to prior aneurysm  clips -EVD placed while in ICU with improvement clinically -CT head on October 22, 2019 confirmed interval improvement -Echocardiogram EF 60-65%, grade 1 diastolic dysfunction, impaired relaxation -Carotid Doppler showed bilateral ICA stenosis 1 to 39%, left vertebral artery demonstrates antegrade flow.  Right vertebral artery was not visualized. -PT/OT recommending SNF- hopefully [facility] on 7/26  Obstructive hydrocephalus -Status post EVD.  EVD was self removed on October 22, 2019 with no acute issues afterward  Acute hypoxic respiratory failure -Resolved, suspect neurologic in etiology  Acute metabolic encephalopathy -unlikely due to infection -suspect secondary to the above  Hypomagnesemia -will order mag level  Severe hypokalemia -potassium 3.1 -will replace and monitor BMP -will order magnesium level   Essential hypertension -Stable, Continue Coreg, hydrochlorothiazide, lisinopril  Dysphagia -Speech therapy consulted, currently on regular diet without difficulty  Diabetes mellitus, type II uncontrolled with hyperglycemia -Hemoglobin A1c 9.1 -Continue to monitor CBG, continue insulin sliding scale with premeal insulin and Levemir  PSVT -Asymptomatic, continue Coreg  Hyperlipidemia -LDL 186 -Continue statin  Normocytic anemia -Likely anemia of critical illness/poor nutrition and acute illness phase, no gross blood loss -Hemoglobin currently 13.1 -Continue to monitor H&H  DVT Prophylaxis  lovenox  Code Status: Full  Family Communication: None at bedside  Disposition Plan:  Status is: Inpatient  Remains inpatient appropriate because:Altered mental status and Unsafe d/c plan. Patient continues to be alert to self only.   Dispo:  Patient From: Home  Planned Disposition: Skilled Nursing Facility  Expected discharge date:  2 days (11/23/2019)  Medically stable for discharge: Yes  Consultants Neurology Neurosurgery PCCM PMR  Procedures    Echocardiogram Carotid Doppler  Antibiotics   Anti-infectives (From admission, onward)   None      Subjective:   Latoya Cox seen and examined today.  Patient with no complaints today.  Denies current chest pain, shortness breath, abdominal pain, nausea vomiting, diarrhea constipation, dizziness or headache.  Feels tired today.  Objective:   Vitals:   11/20/19 2009 11/21/19 0020 11/21/19 0343 11/21/19 0856  BP: 124/77 (!) 149/93 (!) 132/71 125/85  Pulse: 90 96 85 103  Resp: 20 18 17 20   Temp: 97.8 F (36.6 C) 97.9 F (36.6 C) 98.3 F (36.8 C) 98.5 F (36.9 C)  TempSrc: Oral  Oral Oral  SpO2: 100% 100% 100% 100%  Weight:      Height:        Intake/Output Summary (Last 24 hours) at 11/21/2019 1027 Last data filed at 11/21/2019 11/23/2019 Gross per 24 hour  Intake 120 ml  Output --  Net 120 ml   Filed Weights   11/10/19 0352 11/11/19 0325 11/12/19 0323  Weight: 73.5 kg 73.6 kg 73 kg   Exam  General: Well developed, well nourished, NAD, appears stated age  HEENT: NCAT, mucous membranes moist.   Cardiovascular: S1 S2 auscultated, RRR, no murmur  Respiratory: Clear to auscultation bilaterally  Abdomen: Soft, nontender, nondistended, + bowel sounds  Extremities: warm dry without cyanosis clubbing or edema  Neuro: AAOx2 (name, place), nonfocal  Psych: Pleasant, appropriate mood and affect  Data Reviewed: I have personally reviewed following labs and imaging studies  CBC: Recent Labs  Lab 11/15/19 0506 11/19/19 0430  WBC 7.4 7.8  HGB 12.6 13.1  HCT 39.7 41.1  MCV 86.5 86.2  PLT 135* 144*   Basic Metabolic Panel: Recent Labs  Lab 11/15/19 0506 11/16/19 0828 11/19/19 0430 11/20/19 1058  NA 139 143 140 139  K 2.8* 3.9 3.2* 3.1*  CL 101 108 101 100  CO2 29 24 30 27   GLUCOSE 110* 141* 135* 254*  BUN 12 14 19  25*  CREATININE 0.83 0.95 0.88 1.21*  CALCIUM 9.2 9.0 9.3 9.4  MG 1.9  --  1.8  --    GFR: Estimated Creatinine Clearance: 50.9 mL/min (A)  (by C-G formula based on SCr of 1.21 mg/dL (H)). Liver Function Tests: No results for input(s): AST, ALT, ALKPHOS, BILITOT, PROT, ALBUMIN in the last 168 hours. No results for input(s): LIPASE, AMYLASE in the last 168 hours. No results for input(s): AMMONIA in the last 168 hours. Coagulation Profile: No results for input(s): INR, PROTIME in the last 168 hours. Cardiac Enzymes: No results for input(s): CKTOTAL, CKMB, CKMBINDEX, TROPONINI in the last 168 hours. BNP (last 3 results) No results for input(s): PROBNP in the last 8760 hours. HbA1C: No results for input(s): HGBA1C in the last 72 hours. CBG: Recent Labs  Lab 11/20/19 0754 11/20/19 1241 11/20/19 1650 11/20/19 2113 11/21/19 0605  GLUCAP 137* 189* 116* 309* 120*   Lipid Profile: No results for input(s): CHOL, HDL, LDLCALC, TRIG, CHOLHDL, LDLDIRECT in the last 72 hours. Thyroid Function Tests: No results for input(s): TSH, T4TOTAL, FREET4, T3FREE, THYROIDAB in the last 72 hours. Anemia Panel: No results for input(s): VITAMINB12, FOLATE, FERRITIN, TIBC, IRON, RETICCTPCT in the last 72 hours. Urine analysis:    Component Value Date/Time   COLORURINE YELLOW 10/24/2019 1618   APPEARANCEUR CLEAR 10/24/2019 1618   LABSPEC 1.029 10/24/2019 1618   PHURINE 5.0 10/24/2019 1618   GLUCOSEU >=500 (A) 10/24/2019 1618  HGBUR NEGATIVE 10/24/2019 1618   BILIRUBINUR NEGATIVE 10/24/2019 1618   KETONESUR 5 (A) 10/24/2019 1618   PROTEINUR 30 (A) 10/24/2019 1618   UROBILINOGEN 0.2 09/04/2014 1850   NITRITE NEGATIVE 10/24/2019 1618   LEUKOCYTESUR TRACE (A) 10/24/2019 1618   Sepsis Labs: @LABRCNTIP (procalcitonin:4,lacticidven:4)  )No results found for this or any previous visit (from the past 240 hour(s)).    Radiology Studies: No results found.   Scheduled Meds: . (feeding supplement) PROSource Plus  30 mL Oral BID BM  . atorvastatin  40 mg Oral Daily  . carvedilol  25 mg Oral BID WC  . chlorhexidine  15 mL Mouth Rinse BID    . docusate  100 mg Oral BID  . enoxaparin (LOVENOX) injection  40 mg Subcutaneous Q24H  . feeding supplement (ENSURE ENLIVE)  237 mL Oral TID BM  . hydrochlorothiazide  12.5 mg Oral Daily  . insulin aspart  0-9 Units Subcutaneous TID WC  . insulin aspart  4 Units Subcutaneous TID WC  . insulin detemir  13 Units Subcutaneous BID  . lisinopril  10 mg Oral Daily  . mouth rinse  15 mL Mouth Rinse q12n4p  . pantoprazole  40 mg Oral QHS  . polyethylene glycol  17 g Oral Daily  . senna-docusate  1 tablet Oral BID   Continuous Infusions:   LOS: 37 days   Time Spent in minutes   30 minutes  Dequavious Harshberger D.O. on 11/21/2019 at 10:27 AM  Between 7am to 7pm - Please see pager noted on [email or web address]  After 7pm go to [email or web address]  And look for the night coverage person covering for me after hours  [facility] Office  [phone]

## 2019-11-22 DIAGNOSIS — R4182 Altered mental status, unspecified: Secondary | ICD-10-CM | POA: Diagnosis not present

## 2019-11-22 DIAGNOSIS — J9601 Acute respiratory failure with hypoxia: Secondary | ICD-10-CM | POA: Diagnosis not present

## 2019-11-22 DIAGNOSIS — E1169 Type 2 diabetes mellitus with other specified complication: Secondary | ICD-10-CM | POA: Diagnosis not present

## 2019-11-22 DIAGNOSIS — I610 Nontraumatic intracerebral hemorrhage in hemisphere, subcortical: Secondary | ICD-10-CM | POA: Diagnosis not present

## 2019-11-22 LAB — HEMOGLOBIN AND HEMATOCRIT, BLOOD
HCT: 38 % (ref 36.0–46.0)
Hemoglobin: 12.1 g/dL (ref 12.0–15.0)

## 2019-11-22 LAB — BASIC METABOLIC PANEL
Anion gap: 9 (ref 5–15)
BUN: 23 mg/dL — ABNORMAL HIGH (ref 6–20)
CO2: 30 mmol/L (ref 22–32)
Calcium: 9.2 mg/dL (ref 8.9–10.3)
Chloride: 105 mmol/L (ref 98–111)
Creatinine, Ser: 1.07 mg/dL — ABNORMAL HIGH (ref 0.44–1.00)
GFR calc Af Amer: >60 mL/min (ref >60)
GFR calc non Af Amer: 60 mL/min — ABNORMAL LOW (ref >60)
Glucose, Bld: 116 mg/dL — ABNORMAL HIGH (ref 70–99)
Potassium: 3.3 mmol/L — ABNORMAL LOW (ref 3.5–5.1)
Sodium: 144 mmol/L (ref 135–145)

## 2019-11-22 LAB — GLUCOSE, CAPILLARY
Glucose-Capillary: 111 mg/dL — ABNORMAL HIGH (ref 70–99)
Glucose-Capillary: 185 mg/dL — ABNORMAL HIGH (ref 70–99)
Glucose-Capillary: 201 mg/dL — ABNORMAL HIGH (ref 70–99)
Glucose-Capillary: 161 mg/dL — ABNORMAL HIGH (ref 70–99)

## 2019-11-22 LAB — MAGNESIUM: Magnesium: 1.9 mg/dL (ref 1.7–2.4)

## 2019-11-22 LAB — SARS CORONAVIRUS 2 (TAT 6-24 HRS): SARS Coronavirus 2: NEGATIVE

## 2019-11-22 MED ORDER — POTASSIUM CHLORIDE CRYS ER 20 MEQ PO TBCR
40.0000 meq | EXTENDED_RELEASE_TABLET | ORAL | Status: AC
  Administered 2019-11-22 (×2): 40 meq via ORAL
  Filled 2019-11-22 (×2): qty 2

## 2019-11-22 NOTE — Progress Notes (Signed)
PROGRESS NOTE    Latoya Cox  UGQ:[id number] DOB: [birth year] DOA: 10/15/2019 PCP: Myrlene Broker, MD   Brief Narrative:  HPI on 10/15/2019 by Dr. Caryl Pina (neuro) Latoya Cox is an 54 y.o. female with DM, HTN and arthritis, [name] and depressed level of consciousness with garbled speech at home, after complaining of a headache earlier in the day. Husband told EMS that the patient had been complaining of a headache since the morning, but was with no deficits at that time. At 4:30 PM, she continued to complain about her headache and she also felt sleepy, so she took a nap. Her husband checked on her at 6:30 PM, noted the right sided weakness and depressed level of consciousness, and called EMS. On EMS arrival, they noted the same, as well as right facial droop. She vomited en route and continued to vomit intermittently on arrival to the ED. She was obtunded to somnolent and unable to answer questions intelligibly. STAT CT head was obtained, revealing an ICH originating from the left basal ganglila, extending into the left lateral, third and 4th ventricles, with a small amount of blood in the right lateral ventricle as well. Early stages of hydrocephalus were also noted on CT.   Interim history She was admitted to the ICU by the Neuro Hospitalist team.  She was seen by Neurosurgery and an EVD was placed.  She remained in the ICU for a prolonged period on ventilator support but ultimately was able to be weaned and transferred out to [facility] on hospital day #9, June 27.  Physical therapy has recommended SNF placement and a bed search is currently underway. Assessment & Plan   Left thalamic intracranial intraventricular hemorrhage -On admission showed acute ICH left thalamus and intraventricular penetration, early dilation of lateral ventricles -Initially admitted by neurology -MRI not performed due to prior aneurysm  clips -EVD placed while in ICU with improvement clinically -CT head on October 22, 2019 confirmed interval improvement -Echocardiogram EF 60-65%, grade 1 diastolic dysfunction, impaired relaxation -Carotid Doppler showed bilateral ICA stenosis 1 to 39%, left vertebral artery demonstrates antegrade flow.  Right vertebral artery was not visualized. -PT/OT recommending SNF- hopefully [facility] on 7/26. Will order repeat COVID test today.  Obstructive hydrocephalus -Status post EVD.  EVD was self removed on October 22, 2019 with no acute issues afterward  Acute hypoxic respiratory failure -Resolved, suspect neurologic in etiology  Acute metabolic encephalopathy -unlikely due to infection -suspect secondary to the above  Hypomagnesemia -resolved with replacement, currently 1.9  Severe hypokalemia -potasium improving slowly, currently 3.3 -will replace and monitor BMP  Essential hypertension -Stable, Continue Coreg, hydrochlorothiazide, lisinopril  Dysphagia -Speech therapy consulted, currently on regular diet without difficulty  Diabetes mellitus, type II uncontrolled with hyperglycemia -Hemoglobin A1c 9.1 -Continue to monitor CBG, continue insulin sliding scale with premeal insulin and Levemir  PSVT -Asymptomatic, continue Coreg  Hyperlipidemia -LDL 186 -Continue statin  Normocytic anemia -Likely anemia of critical illness/poor nutrition and acute illness phase, no gross blood loss -Hemoglobin currently 12.1 -Continue to monitor H&H  DVT Prophylaxis  lovenox  Code Status: Full  Family Communication: None at bedside  Disposition Plan:  Status is: Inpatient  Remains inpatient appropriate because:Altered mental status and Unsafe d/c plan. Patient continues to be alert to self only.   Dispo:  Patient From: Home  Planned Disposition: Skilled Nursing Facility  Expected discharge date:  1 days (11/23/2019)  Medically stable  for discharge: Yes    Consultants Neurology Neurosurgery PCCM PMR  Procedures  Echocardiogram Carotid Doppler  Antibiotics   Anti-infectives (From admission, onward)   None      Subjective:   Latoya Cox seen and examined today.  Currently eating breakfast.  Denies any complaints today.  Denies chest pain, shortness of breath, abdominal pain, dizziness or headache.  Objective:   Vitals:   11/21/19 1938 11/21/19 2345 11/22/19 0313 11/22/19 0718  BP: (!) 132/93 (!) 138/81 (!) 137/97 (!) 156/97  Pulse: 90 86 94 93  Resp: 16 16 16 20   Temp: 98.9 F (37.2 C) 98.1 F (36.7 C) 98.1 F (36.7 C) 98.2 F (36.8 C)  TempSrc: Oral Oral Axillary Oral  SpO2: 100% 100% 100% 100%  Weight:      Height:        Intake/Output Summary (Last 24 hours) at 11/22/2019 0947 Last data filed at 11/21/2019 1318 Gross per 24 hour  Intake 240 ml  Output --  Net 240 ml   Filed Weights   11/10/19 0352 11/11/19 0325 11/12/19 0323  Weight: 73.5 kg 73.6 kg 73 kg   Exam  General: Well developed, well nourished, NAD, appears stated age  HEENT: NCAT,mucous membranes moist.   Cardiovascular: S1 S2 auscultated, RRR, no murmur  Respiratory: Clear to auscultation bilaterally with equal chest rise  Abdomen: Soft, nontender, nondistended, + bowel sounds  Extremities: warm dry without cyanosis clubbing or edema  Neuro: AAOx2 (self, place), nonfocal  Psych: appropriate mood and affect   Data Reviewed: I have personally reviewed following labs and imaging studies  CBC: Recent Labs  Lab 11/19/19 0430 11/22/19 0447  WBC 7.8  --   HGB 13.1 12.1  HCT 41.1 38.0  MCV 86.2  --   PLT 144*  --    Basic Metabolic Panel: Recent Labs  Lab 11/16/19 0828 11/19/19 0430 11/20/19 1058 11/22/19 0447  NA 143 140 139 144  K 3.9 3.2* 3.1* 3.3*  CL 108 101 100 105  CO2 24 30 27 30   GLUCOSE 141* 135* 254* 116*  BUN 14 19 25* 23*  CREATININE 0.95 0.88 1.21* 1.07*  CALCIUM 9.0 9.3 9.4 9.2  MG  --  1.8  --  1.9    GFR: Estimated Creatinine Clearance: 57.6 mL/min (A) (by C-G formula based on SCr of 1.07 mg/dL (H)). Liver Function Tests: No results for input(s): AST, ALT, ALKPHOS, BILITOT, PROT, ALBUMIN in the last 168 hours. No results for input(s): LIPASE, AMYLASE in the last 168 hours. No results for input(s): AMMONIA in the last 168 hours. Coagulation Profile: No results for input(s): INR, PROTIME in the last 168 hours. Cardiac Enzymes: No results for input(s): CKTOTAL, CKMB, CKMBINDEX, TROPONINI in the last 168 hours. BNP (last 3 results) No results for input(s): PROBNP in the last 8760 hours. HbA1C: No results for input(s): HGBA1C in the last 72 hours. CBG: Recent Labs  Lab 11/21/19 0605 11/21/19 1126 11/21/19 1614 11/21/19 2126 11/22/19 0635  GLUCAP 120* 156* 269* 179* 111*   Lipid Profile: No results for input(s): CHOL, HDL, LDLCALC, TRIG, CHOLHDL, LDLDIRECT in the last 72 hours. Thyroid Function Tests: No results for input(s): TSH, T4TOTAL, FREET4, T3FREE, THYROIDAB in the last 72 hours. Anemia Panel: No results for input(s): VITAMINB12, FOLATE, FERRITIN, TIBC, IRON, RETICCTPCT in the last 72 hours. Urine analysis:    Component Value Date/Time   COLORURINE YELLOW 10/24/2019 1618   APPEARANCEUR CLEAR 10/24/2019 1618   LABSPEC 1.029 10/24/2019 1618   PHURINE  5.0 10/24/2019 1618   GLUCOSEU >=500 (A) 10/24/2019 1618   HGBUR NEGATIVE 10/24/2019 1618   BILIRUBINUR NEGATIVE 10/24/2019 1618   KETONESUR 5 (A) 10/24/2019 1618   PROTEINUR 30 (A) 10/24/2019 1618   UROBILINOGEN 0.2 09/04/2014 1850   NITRITE NEGATIVE 10/24/2019 1618   LEUKOCYTESUR TRACE (A) 10/24/2019 1618   Sepsis Labs: @LABRCNTIP (procalcitonin:4,lacticidven:4)  )No results found for this or any previous visit (from the past 240 hour(s)).    Radiology Studies: No results found.   Scheduled Meds: . (feeding supplement) PROSource Plus  30 mL Oral BID BM  . atorvastatin  40 mg Oral Daily  . carvedilol  25  mg Oral BID WC  . chlorhexidine  15 mL Mouth Rinse BID  . docusate  100 mg Oral BID  . enoxaparin (LOVENOX) injection  40 mg Subcutaneous Q24H  . feeding supplement (ENSURE ENLIVE)  237 mL Oral TID BM  . hydrochlorothiazide  12.5 mg Oral Daily  . insulin aspart  0-9 Units Subcutaneous TID WC  . insulin aspart  4 Units Subcutaneous TID WC  . insulin detemir  13 Units Subcutaneous BID  . lisinopril  10 mg Oral Daily  . mouth rinse  15 mL Mouth Rinse q12n4p  . pantoprazole  40 mg Oral QHS  . polyethylene glycol  17 g Oral Daily  . potassium chloride  40 mEq Oral Q4H  . senna-docusate  1 tablet Oral BID   Continuous Infusions:   LOS: 38 days   Time Spent in minutes   30 minutes  Latoya Cox D.O. on 11/22/2019 at 9:47 AM  Between 7am to 7pm - Please see pager noted on [email or web address]  After 7pm go to [email or web address]  And look for the night coverage person covering for me after hours  [facility] Office  [phone]

## 2019-11-22 NOTE — TOC Progression Note (Addendum)
Transition of Care [facility]) - Progression Note    Patient Details  Name: Latoya Cox MRN: [id number] Date of Birth: [birth year]  Transition of Care [facility]) CM/SW Contact  Nonda Lou, [place] Phone Number: 11/22/2019, 9:52 AM  Clinical Narrative:    CSW attempted to make contact with patient's spouse Shon Hale to provide an update on SNF, awaiting a call back. Requested covid.  Expected Discharge Plan: Skilled Nursing Facility Barriers to Discharge: Inadequate or no insurance, SNF Pending bed offer  Expected Discharge Plan and Services Expected Discharge Plan: Skilled Nursing Facility In-house Referral: Clinical Social Work   Post Acute Care Choice: Skilled Nursing Facility Living arrangements for the past 2 months: Single Family Home                                       Social Determinants of Health (SDOH) Interventions    Readmission Risk Interventions Readmission Risk Prevention Plan 10/29/2019  Transportation Screening Complete  Home Care Screening Complete  Some recent data might be hidden

## 2019-11-23 DIAGNOSIS — E1169 Type 2 diabetes mellitus with other specified complication: Secondary | ICD-10-CM | POA: Diagnosis not present

## 2019-11-23 DIAGNOSIS — J9601 Acute respiratory failure with hypoxia: Secondary | ICD-10-CM | POA: Diagnosis not present

## 2019-11-23 DIAGNOSIS — I610 Nontraumatic intracerebral hemorrhage in hemisphere, subcortical: Secondary | ICD-10-CM | POA: Diagnosis not present

## 2019-11-23 DIAGNOSIS — R4182 Altered mental status, unspecified: Secondary | ICD-10-CM | POA: Diagnosis not present

## 2019-11-23 LAB — BASIC METABOLIC PANEL
Anion gap: 8 (ref 5–15)
BUN: 24 mg/dL — ABNORMAL HIGH (ref 6–20)
CO2: 30 mmol/L (ref 22–32)
Calcium: 9.4 mg/dL (ref 8.9–10.3)
Chloride: 105 mmol/L (ref 98–111)
Creatinine, Ser: 1.16 mg/dL — ABNORMAL HIGH (ref 0.44–1.00)
GFR calc Af Amer: >60 mL/min (ref >60)
GFR calc non Af Amer: 54 mL/min — ABNORMAL LOW (ref >60)
Glucose, Bld: 108 mg/dL — ABNORMAL HIGH (ref 70–99)
Potassium: 3.6 mmol/L (ref 3.5–5.1)
Sodium: 143 mmol/L (ref 135–145)

## 2019-11-23 LAB — GLUCOSE, CAPILLARY
Glucose-Capillary: 106 mg/dL — ABNORMAL HIGH (ref 70–99)
Glucose-Capillary: 306 mg/dL — ABNORMAL HIGH (ref 70–99)

## 2019-11-23 MED ORDER — ENSURE ENLIVE PO LIQD: 237.0000 mL | Freq: Three times a day (TID) | ORAL | Status: DC

## 2019-11-23 MED ORDER — INSULIN ASPART 100 UNIT/ML ~~LOC~~ SOLN: 0.0000 [IU] | Freq: Three times a day (TID) | SUBCUTANEOUS | Status: DC

## 2019-11-23 MED ORDER — POTASSIUM CHLORIDE CRYS ER 20 MEQ PO TBCR: 20.0000 meq | EXTENDED_RELEASE_TABLET | Freq: Every day | ORAL | 0 refills | Status: DC

## 2019-11-23 MED ORDER — INSULIN ASPART 100 UNIT/ML ~~LOC~~ SOLN: 4.0000 [IU] | Freq: Three times a day (TID) | SUBCUTANEOUS | Status: DC

## 2019-11-23 MED ORDER — INSULIN DETEMIR 100 UNIT/ML ~~LOC~~ SOLN: 13.0000 [IU] | Freq: Two times a day (BID) | SUBCUTANEOUS | Status: DC

## 2019-11-23 MED ORDER — DOCUSATE SODIUM 50 MG/5ML PO LIQD: 100.0000 mg | Freq: Two times a day (BID) | ORAL | 0 refills | Status: DC

## 2019-11-23 MED ORDER — PROSOURCE PLUS PO LIQD: 30.0000 mL | Freq: Two times a day (BID) | ORAL | Status: DC

## 2019-11-23 MED ORDER — LISINOPRIL 10 MG PO TABS: 10.0000 mg | ORAL_TABLET | Freq: Every day | ORAL | Status: DC

## 2019-11-23 MED ORDER — ATORVASTATIN CALCIUM 40 MG PO TABS: 40.0000 mg | ORAL_TABLET | Freq: Every day | ORAL | Status: DC

## 2019-11-23 MED ORDER — HYDROCHLOROTHIAZIDE 12.5 MG PO CAPS: 12.5000 mg | ORAL_CAPSULE | Freq: Every day | ORAL | Status: DC

## 2019-11-23 NOTE — TOC Transition Note (Signed)
Transition of Care [facility]) - CM/SW Discharge Note   Patient Details  Name: Anikah Hogge MRN: [id number] Date of Birth: Jul 22, 1965  [facility]) CM/SW Contact:  Baldemar Lenis, LCSW Phone Number: 11/23/2019, 1:40 PM   Clinical Narrative:   Nurse to call report to [phone], Room 118    Final next level of care: Skilled Nursing Facility Barriers to Discharge: Barriers Resolved   Patient Goals and CMS Choice   CMS Medicare.gov Compare Post Acute Care list provided to:: Patient Represenative (must comment) (Spouse) Choice offered to / list presented to : Spouse  Discharge Placement              Patient chooses bed at:  [facility]) Patient to be transferred to facility by: [facility] Name of family member notified: Shon Hale Patient and family notified of of transfer: 11/23/19  Discharge Plan and Services In-house Referral: Clinical Social Work   Post Acute Care Choice: Skilled Nursing Facility                               Social Determinants of Health (SDOH) Interventions     Readmission Risk Interventions Readmission Risk Prevention Plan 10/29/2019  Transportation Screening Complete  Home Care Screening Complete  Some recent data might be hidden

## 2019-11-23 NOTE — Progress Notes (Signed)
Attempted to call report to [facility]. Placed on hold for over 5 mins. EMS made aware that an attempt call report has occurred.

## 2019-11-23 NOTE — Discharge Summary (Signed)
Physician Discharge Summary  Latoya Cox ION:[id number] DOB: [birth year] DOA: 10/15/2019  PCP: Latoya Broker, MD  Admit date: 10/15/2019 Discharge date: 11/23/2019  Time spent: 45 minutes  Recommendations for Outpatient Follow-up:  Patient will be discharged to skilled nursing facility, continue therapy.  Patient will need to follow up with primary care provider within one week of discharge, repeat BMP and magnesium.  Follow up with neurology. Patient should continue medications as prescribed.  Patient should follow a heart health/carb modified diet.   Discharge Diagnoses:  Left thalamic intracranial intraventricular hemorrhage Obstructive hydrocephalus Acute hypoxic respiratory failure Acute metabolic encephalopathy Hypomagnesemia Severe hypokalemia Essential hypertension Dysphagia Diabetes mellitus, type II uncontrolled with hyperglycemia PSVT Hyperlipidemia Normocytic anemia  Discharge Condition: Stable  Diet recommendation: heart healthy/carb modified  Filed Weights   11/10/19 0352 11/11/19 0325 11/12/19 0323  Weight: 73.5 kg 73.6 kg 73 kg    History of present illness:  on 10/15/2019 by Dr. Caryl Cox (neuro) Latoya Cox an 54 y.o.femalewith DM, HTN and arthritis,[name] and depressed level of consciousness with garbled speech at home, after complaining of a headacheearlier in the day. Husband told EMS that the patient had been complaining of a headache since the morning, but was with no deficits at that time. At 4:30 PM, she continued to complain about her headache and she also felt sleepy, so she took a nap. Her husband checked on her at 6:30 PM, noted the right sided weakness and depressed level of consciousness, and called EMS. On EMS arrival, they noted the same, as well as right facial droop. She vomited en route and continued to vomit intermittently on arrival to the ED. She was obtunded to  somnolent and unable to answer questions intelligibly. STAT CT head was obtained, revealing an ICH originating from the left basal ganglila, extending into the left lateral, third and 4th ventricles, with a small amount of blood in the right lateral ventricle as well. Early stages of hydrocephalus were also noted on CT.  Hospital Course:  Left thalamic intracranial intraventricular hemorrhage -On admission showed acute ICH left thalamus and intraventricular penetration, early dilation of lateral ventricles -Initially admitted by neurology -MRI not performed due to prior aneurysm clips -EVD placed while in ICU with improvement clinically -CT head on October 22, 2019 confirmed interval improvement -Echocardiogram EF 60-65%, grade 1 diastolic dysfunction, impaired relaxation -Carotid Doppler showed bilateral ICA stenosis 1 to 39%, left vertebral artery demonstrates antegrade flow.  Right vertebral artery was not visualized. -PT/OT recommending SNF- [name] on 7/26.  -Repeat COVID test negative  Obstructive hydrocephalus -Status post EVD.  EVD was self removed on October 22, 2019 with no acute issues afterward  Acute hypoxic respiratory failure -Resolved, suspect neurologic in etiology  Acute metabolic encephalopathy -unlikely due to infection -suspect secondary to the above  Hypomagnesemia -resolved with replacement -Repeat Magnesium in one week  Severe hypokalemia -resolved with replacement -repeat BMP in one week  Essential hypertension -Stable, Continue Coreg, hydrochlorothiazide, lisinopril  Dysphagia -Speech therapy consulted, currently on regular diet without difficulty  Diabetes mellitus, type II uncontrolled with hyperglycemia -Hemoglobin A1c 9.1 -Continue to monitor CBG, continue insulin sliding scale with premeal insulin and Levemir  PSVT -Asymptomatic, continue Coreg  Hyperlipidemia -LDL 186 -Continue statin  Normocytic anemia -Likely  anemia of critical illness/poor nutrition and acute illness phase, no gross blood loss -Hemoglobin 12.1  Consultants Neurology Neurosurgery PCCM PMR  Procedures  Echocardiogram Carotid Doppler  Discharge Exam: Vitals:   11/23/19 0951 11/23/19 1209  BP: (!) 158/102 (!) 145/93  Pulse: 101 96  Resp: 18 20  Temp: 97.6 F (36.4 C) 98.4 F (36.9 C)  SpO2: 100% 100%     General: Well developed, well nourished, NAD, appears stated age  HEENT: NCAT, mucous membranes moist.  Cardiovascular: S1 S2 auscultated, no murmur, RRR  Respiratory: Clear to auscultation bilaterally   Abdomen: Soft, nontender, nondistended, + bowel sounds  Extremities: warm dry without cyanosis clubbing or edema  Neuro: AAOx2 (self, place), nonfocal  Psych: Pleasant, appropriate mood and affect  Discharge Instructions Discharge Instructions    Ambulatory referral to Neurology   Complete by: As directed    Follow up in stroke clinic at [facility] in about 4 weeks after rehab stay (6-8 weeks from now) with Dr. Delia Cox, if not available then Dr. Jamelle Cox, or Dr. Naomie Cox.   For d/c to IP rehab soon.   Discharge instructions   Complete by: As directed    Patient will be discharged to skilled nursing facility, continue therapy.  Patient will need to follow up with primary care provider within one week of discharge, repeat BMP and magnesium.  Follow up with neurology. Patient should continue medications as prescribed.  Patient should follow a heart health/carb modified diet.   No dressing needed   Complete by: As directed      Allergies as of 11/23/2019      Reactions   Hydrocodone Itching   Pt states "if i take 2, it makes me itch"      Medication List    STOP taking these medications   amLODipine 10 MG tablet Commonly known as: NORVASC   diclofenac 75 MG EC tablet Commonly known as: VOLTAREN   Diethylpropion HCl 25 MG Tabs   hydrALAZINE 25 MG  tablet Commonly known as: APRESOLINE   pioglitazone 30 MG tablet Commonly known as: ACTOS   SLEEP AID PO     TAKE these medications   (feeding supplement) PROSource Plus liquid Take 30 mLs by mouth 2 (two) times daily between meals.   feeding supplement (ENSURE ENLIVE) Liqd Take 237 mLs by mouth 3 (three) times daily between meals.   atorvastatin 40 MG tablet Commonly known as: LIPITOR Take 1 tablet (40 mg total) by mouth daily. Start taking on: November 24, 2019   Benzoyl Peroxide 10 % Crea Use on face twice daily   carvedilol 25 MG tablet Commonly known as: COREG TAKE 1 TAB 2 TIMES DAILY WITH A MEAL. OVERDUE FOR ANNUAL APPT MUST SEE PROVIDER FOR FUTURE REFILLS What changed: See the new instructions.   Clever Chek Lancets Misc Use daily to check sugars.   OneTouch Delica Lancets 33G Misc Use to help check blood sugars twice a day Dx E11.9   docusate 50 MG/5ML liquid Commonly known as: COLACE Take 10 mLs (100 mg total) by mouth 2 (two) times daily.   fluticasone 50 MCG/ACT nasal spray Commonly known as: FLONASE Place 1 spray daily as needed into both nostrils for allergies.   glucose blood test strip Commonly known as: Insurance account manager Use as instructed   glucose blood test strip Commonly known as: OneTouch Verio 1 each by Other route 2 (two) times daily. Use to check blood sugars twice a day Dx E11.9   hydrochlorothiazide 12.5 MG capsule Commonly known as: MICROZIDE Take 1 capsule (12.5 mg total) by mouth daily. Start taking on: November 24, 2019   insulin  aspart 100 UNIT/ML injection Commonly known as: novoLOG Inject 0-9 Units into the skin 3 (three) times daily with meals. Sliding scale  CBG 70 - 120: 0 units  CBG 121 - 150: 1 unit,   CBG 151 - 200: 2 units,   CBG 201 - 250: 3 units,   CBG 251 - 300: 5 units,   CBG 301 - 350: 7 units,   CBG 351 - 400: 9 units    CBG > 400: 9 units and notify your MD   insulin aspart 100 UNIT/ML  injection Commonly known as: novoLOG Inject 4 Units into the skin 3 (three) times daily with meals.   insulin detemir 100 UNIT/ML injection Commonly known as: LEVEMIR Inject 0.13 mLs (13 Units total) into the skin 2 (two) times daily.   lisinopril 10 MG tablet Commonly known as: ZESTRIL Take 1 tablet (10 mg total) by mouth daily. Start taking on: November 24, 2019   Multivitamin Adult Tabs Take 1 tablet by mouth daily.   nystatin-triamcinolone ointment Commonly known as: MYCOLOG APPLY TO AFFECTED AREA TWICE DAILY What changed: See the new instructions.   omeprazole 40 MG capsule Commonly known as: PRILOSEC TAKE 1 CAPSULE BY MOUTH EVERY DAY What changed: how much to take   potassium chloride SA 20 MEQ tablet Commonly known as: Klor-Con M20 Take 1 tablet (20 mEq total) by mouth daily. What changed: See the new instructions.   vitamin C 100 MG tablet Take 100 mg by mouth daily.            Discharge Care Instructions  (From admission, onward)         Start     Ordered   11/23/19 0000  No dressing needed        11/23/19 1251         Allergies  Allergen Reactions  . Hydrocodone Itching    Pt states "if i take 2, it makes me itch"    Follow-up Information    [facility] Follow up in 4 week(s).   Specialty: Neurology Why: stroke clinic. office will call with appt date and time.  Contact information: [address] [place] [id number] [phone]       Latoya Brokerrawford, Elizabeth A, MD. Schedule an appointment as soon as possible for a visit in 1 week(s).   Specialty: Internal Medicine Why: Hospital follow up Contact information: [address] [place] [id number] [phone]                The results of significant diagnostics from this hospitalization (including imaging, microbiology, ancillary and laboratory) are listed below for reference.    Significant Diagnostic Studies: CT HEAD WO  CONTRAST  Result Date: 11/02/2019 CLINICAL DATA:  History of intracranial hemorrhage. EXAM: CT HEAD WITHOUT CONTRAST TECHNIQUE: Contiguous axial images were obtained from the base of the skull through the vertex without intravenous contrast. COMPARISON:  CT head dated 10/22/2019 FINDINGS: Brain: Intraparenchymal hemorrhage centered in the left medial basal ganglia and thalamus has continued to decrease in size and density. No new hemorrhage is identified. There is no hydrocephalus. The basilar cisterns are patent and there is no significant midline shift. Mild hypoattenuation is seen along the tract of the prior left ventricular catheter. Subdural hygromas along the tentorium are unchanged. Vascular: An aneurysm clip is redemonstrated. Skull: A left frontal burr hole and prior left craniotomy are redemonstrated. Sinuses/Orbits: No acute finding. Other: None. IMPRESSION: Continued to decrease in size and density of  the left basal ganglia and thalamic hemorrhage. No new hemorrhage or hydrocephalus. Electronically Signed   By: Romona Curls M.D.   On: 11/02/2019 20:21   DG CHEST PORT 1 VIEW  Result Date: 10/24/2019 CLINICAL DATA:  Stroke EXAM: PORTABLE CHEST 1 VIEW COMPARISON:  Radiograph 10/20/2019 FINDINGS: Persistent bandlike opacities in right lung base favoring subsegmental atelectatic change. Additional hazy areas of basilar atelectasis bilaterally. No focal consolidation, pneumothorax, effusion or convincing features of edema. Interval removal of the transesophageal endotracheal tubes. Telemetry leads remain over the chest. High attenuation contrast material is noted in the colon at the level of the splenic flexure. Soft tissues and osseous structures are otherwise unremarkable. IMPRESSION: 1. Interval removal of the transesophageal and endotracheal tubes. 2. Bibasilar atelectasis including more subsegmental atelectasis in the right lung base. Electronically Signed   By: Kreg Shropshire M.D.   On: 10/24/2019  23:07   DG Swallowing Func-Speech Pathology  Result Date: 11/06/2019 Objective Swallowing Evaluation: Type of Study: MBS-Modified Barium Swallow Study  Patient Details Name: Murl Zogg MRN: [id number] Date of Birth: [birth year] Today's Date: 11/06/2019 Time: SLP Start Time (ACUTE ONLY): 1335 -SLP Stop Time (ACUTE ONLY): 1400 SLP Time Calculation (min) (ACUTE ONLY): 25 min Past Medical History: Past Medical History: Diagnosis Date . Arthritis  . Chest wall pain  . Diabetes mellitus without complication (HCC)  . Hypertension  Past Surgical History: Past Surgical History: Procedure Laterality Date . aneurism repair   . lapband   HPI: Pt with acute headache and slurred speech, brought by EMS to hospital. CT head was obtained, revealing an ICH originating from the left basal ganglila, extending into the left lateral, third and 4th ventricles, with a small amount of blood in the right lateral ventricle as well. Early stages of hydrocephalus were also noted on CT. Acute hypoxia from respiratory failure, requiring intubation on 10/16/19. Extubated 10/21/19.  Subjective: alert, cooperative, needs cues Assessment / Plan / Recommendation CHL IP CLINICAL IMPRESSIONS 11/06/2019 Clinical Impression Pt presents with much improved oropharyngeal swallow function - she continues to have some persisting difficulty with oral manipulation/cohesion of mechanical solids, but once she propels them into pharynx, they pass swiftly through UES with no residue.  Thin liquids fill the pyriform sinuses prior to initiation of the pharyngeal swallow, however there is reliable laryngeal vestibule closure with no penetration nor aspiration.  Recommend continuing a dysphagia 2 diet for now; advance liquids to thin.  Give meds whole in puree. Pt was pleased with progress.  SLP Visit Diagnosis Dysphagia, oral phase (R13.11) Attention and concentration deficit following -- Frontal lobe and executive function deficit following -- Impact on safety and  function --   CHL IP TREATMENT RECOMMENDATION 11/06/2019 Treatment Recommendations Therapy as outlined in treatment plan below   Prognosis 11/06/2019 Prognosis for Safe Diet Advancement Good Barriers to Reach Goals -- Barriers/Prognosis Comment -- CHL IP DIET RECOMMENDATION 11/06/2019 SLP Diet Recommendations Dysphagia 2 (Fine chop) solids;Thin liquid Liquid Administration via Cup;Straw Medication Administration Whole meds with puree Compensations Minimize environmental distractions Postural Changes --   CHL IP OTHER RECOMMENDATIONS 11/06/2019 Recommended Consults -- Oral Care Recommendations Oral care BID Other Recommendations --   CHL IP FOLLOW UP RECOMMENDATIONS 11/06/2019 Follow up Recommendations Skilled Nursing facility   [facility] IP FREQUENCY AND DURATION 11/06/2019 Speech Therapy Frequency (ACUTE ONLY) min 2x/week Treatment Duration --      CHL IP ORAL PHASE 11/06/2019 Oral Phase Impaired Oral - Pudding Teaspoon -- Oral - Pudding Cup -- Oral - Honey Teaspoon -- Oral -  Honey Cup -- Oral - Nectar Teaspoon -- Oral - Nectar Cup NT Oral - Nectar Straw NT Oral - Thin Teaspoon -- Oral - Thin Cup WFL Oral - Thin Straw WFL Oral - Puree WFL Oral - Mech Soft Delayed oral transit Oral - Regular -- Oral - Multi-Consistency -- Oral - Pill -- Oral Phase - Comment --  CHL IP PHARYNGEAL PHASE 11/06/2019 Pharyngeal Phase WFL Pharyngeal- Pudding Teaspoon -- Pharyngeal -- Pharyngeal- Pudding Cup -- Pharyngeal -- Pharyngeal- Honey Teaspoon -- Pharyngeal -- Pharyngeal- Honey Cup -- Pharyngeal -- Pharyngeal- Nectar Teaspoon -- Pharyngeal -- Pharyngeal- Nectar Cup NT Pharyngeal -- Pharyngeal- Nectar Straw NT Pharyngeal -- Pharyngeal- Thin Teaspoon -- Pharyngeal -- Pharyngeal- Thin Cup WFL;Delayed swallow initiation-pyriform sinuses Pharyngeal Material does not enter airway Pharyngeal- Thin Straw WFL;Delayed swallow initiation-pyriform sinuses Pharyngeal Material does not enter airway Pharyngeal- Puree Delayed swallow initiation-vallecula Pharyngeal --  Pharyngeal- Mechanical Soft Delayed swallow initiation-vallecula Pharyngeal -- Pharyngeal- Regular -- Pharyngeal -- Pharyngeal- Multi-consistency -- Pharyngeal -- Pharyngeal- Pill -- Pharyngeal -- Pharyngeal Comment --  CHL IP CERVICAL ESOPHAGEAL PHASE 10/23/2019 Cervical Esophageal Phase WFL Pudding Teaspoon -- Pudding Cup -- Honey Teaspoon -- Honey Cup -- Nectar Teaspoon -- Nectar Cup -- Nectar Straw -- Thin Teaspoon -- Thin Cup -- Thin Straw -- Puree -- Mechanical Soft -- Regular -- Multi-consistency -- Pill -- Cervical Esophageal Comment -- [name] 11/06/2019, 2:41 PM               Microbiology: Recent Results (from the past 240 hour(s))  SARS CORONAVIRUS 2 (TAT 6-24 HRS) Nasopharyngeal Nasopharyngeal Swab     Status: None   Collection Time: 11/22/19 12:36 PM   Specimen: Nasopharyngeal Swab  Result Value Ref Range Status   SARS Coronavirus 2 NEGATIVE NEGATIVE Final    Comment: (NOTE) SARS-CoV-2 target nucleic acids are NOT DETECTED.  The SARS-CoV-2 RNA is generally detectable in upper and lower respiratory specimens during the acute phase of infection. Negative results do not preclude SARS-CoV-2 infection, do not rule out co-infections with other pathogens, and should not be used as the sole basis for treatment or other patient management decisions. Negative results must be combined with clinical observations, patient history, and epidemiological information. The expected result is Negative.  Fact Sheet for Patients: HairSlick.no  Fact Sheet for Healthcare Providers: quierodirigir.com  This test is not yet approved or cleared by the [place] FDA and  has been authorized for detection and/or diagnosis of SARS-CoV-2 by FDA under an Emergency Use Authorization (EUA). This EUA will remain  in effect (meaning this test can be used) for the duration of the COVID-19 declaration under Se ction 564(b)(1) of the Act, 21  U.S.C. section 360bbb-3(b)(1), unless the authorization is terminated or revoked sooner.  Performed at [facility], [address], [place], [place] [address]      Labs: Basic Metabolic Panel: Recent Labs  Lab 11/19/19 0430 11/20/19 1058 11/22/19 0447 11/23/19 0236  NA 140 139 144 143  K 3.2* 3.1* 3.3* 3.6  CL 101 100 105 105  CO2 GLUCOSE 135* 254* 116* 108*  BUN 19 25* 23* 24*  CREATININE 0.88 1.21* 1.07* 1.16*  CALCIUM 9.3 9.4 9.2 9.4  MG 1.8  --  1.9  --    Liver Function Tests: No results for input(s): AST, ALT, ALKPHOS, BILITOT, PROT, ALBUMIN in the last 168 hours. No results for input(s): LIPASE, AMYLASE in the last 168 hours. No results for input(s): AMMONIA in  the last 168 hours. CBC: Recent Labs  Lab 11/19/19 0430 11/22/19 0447  WBC 7.8  --   HGB 13.1 12.1  HCT 41.1 38.0  MCV 86.2  --   PLT 144*  --    Cardiac Enzymes: No results for input(s): CKTOTAL, CKMB, CKMBINDEX, TROPONINI in the last 168 hours. BNP: BNP (last 3 results) No results for input(s): BNP in the last 8760 hours.  ProBNP (last 3 results) No results for input(s): PROBNP in the last 8760 hours.  CBG: Recent Labs  Lab 11/22/19 1136 11/22/19 1555 11/22/19 2057 11/23/19 0645 11/23/19 1212  GLUCAP 185* 201* 161* 106* 306*       Signed:  Delrose Rohwer  [facility] 11/23/2019, 12:53 PM

## 2019-11-24 ENCOUNTER — Telehealth: Payer: Self-pay | Admitting: *Deleted

## 2019-11-24 NOTE — Telephone Encounter (Signed)
Pt was on TCM report admitted 10/15/19 for right sided weakness and depressed level of consciousness. EMS was called and on arrival EMS noted the same, as well as right facial droop. STAT CT head was obtained, revealing an ICH originating from the left basal ganglila, extending into the left lateral, third and 4th ventricles, with a small amount of blood in the right lateral ventricle as well. Early stages of hydrocephalus were also noted on CT.Status post EVD. EVD was self removed on October 22, 2019 with no acute issues. PT/OT recommending SNF D/C 11/23/19, and will follow-up w/PCP once she has been discharge from SNF..,Raechel Chute

## 2020-01-01 ENCOUNTER — Other Ambulatory Visit: Payer: Self-pay

## 2020-01-01 ENCOUNTER — Ambulatory Visit (INDEPENDENT_AMBULATORY_CARE_PROVIDER_SITE_OTHER): Payer: No Typology Code available for payment source | Admitting: Family

## 2020-01-01 ENCOUNTER — Ambulatory Visit (INDEPENDENT_AMBULATORY_CARE_PROVIDER_SITE_OTHER): Payer: No Typology Code available for payment source

## 2020-01-01 VITALS — BP 124/72 | HR 74 | Temp 98.2°F | Ht 62.0 in | Wt 158.4 lb

## 2020-01-01 DIAGNOSIS — E1169 Type 2 diabetes mellitus with other specified complication: Secondary | ICD-10-CM

## 2020-01-01 DIAGNOSIS — M25561 Pain in right knee: Secondary | ICD-10-CM

## 2020-01-01 DIAGNOSIS — G8929 Other chronic pain: Secondary | ICD-10-CM

## 2020-01-01 DIAGNOSIS — E669 Obesity, unspecified: Secondary | ICD-10-CM

## 2020-01-01 DIAGNOSIS — I10 Essential (primary) hypertension: Secondary | ICD-10-CM | POA: Diagnosis not present

## 2020-01-01 DIAGNOSIS — Z8673 Personal history of transient ischemic attack (TIA), and cerebral infarction without residual deficits: Secondary | ICD-10-CM

## 2020-01-01 DIAGNOSIS — R790 Abnormal level of blood mineral: Secondary | ICD-10-CM

## 2020-01-01 DIAGNOSIS — M79641 Pain in right hand: Secondary | ICD-10-CM

## 2020-01-01 MED ORDER — INSULIN DETEMIR 100 UNIT/ML ~~LOC~~ SOLN: 13.0000 [IU] | Freq: Two times a day (BID) | SUBCUTANEOUS | 1 refills | Status: AC

## 2020-01-01 MED ORDER — INSULIN ASPART 100 UNIT/ML ~~LOC~~ SOLN: SUBCUTANEOUS | 1 refills | Status: AC

## 2020-01-01 MED ORDER — BLOOD GLUCOSE MONITOR KIT: PACK | 0 refills | Status: AC

## 2020-01-01 NOTE — Progress Notes (Signed)
Latoya Cox is a 54 y.o. female with the following history as recorded in EpicCare:  Patient Active Problem List   Diagnosis Date Noted  . Acute hypoxemic respiratory failure ([place])   . Altered mental status   . 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 Acne 10/05/2019  . Intractable nausea and vomiting 01/14/2019  . Lactic acidosis 01/14/2019  . QT prolongation 01/14/2019  . Suspected COVID-19 virus infection 12/11/2018  . Hx of completed stroke 08/28/2018  . Hypokalemia 10/18/2017  . Overweight (BMI 25.0-29.9) 05/14/2017  . Routine general medical examination at a health care facility 08/02/2016  . Neck fullness 01/27/2016  . Diabetes mellitus type 2 in obese ([place]) 05/17/2015  . Essential hypertension 05/17/2015  . Vitamin D deficiency 05/17/2015    Current Outpatient Medications  Medication Sig Dispense Refill  . Ascorbic Acid (VITAMIN C) 100 MG tablet Take 100 mg by mouth daily.    Marland Kitchen atorvastatin (LIPITOR) 40 MG tablet Take 1 tablet (40 mg total) by mouth daily.    . Benzoyl Peroxide 10 % CREA Use on face twice daily 141 g 3  . carvedilol (COREG) 25 MG tablet TAKE 1 TAB 2 TIMES DAILY WITH A MEAL. OVERDUE FOR ANNUAL APPT MUST SEE PROVIDER FOR FUTURE REFILLS (Patient taking differently: Take 25 mg by mouth in the morning and at bedtime. ) 60 tablet 0  . CLEVER CHEK LANCETS MISC Use daily to check sugars. 100 each 11  . CVS STOOL SOFTENER 100 MG capsule Take 100 mg by mouth daily.     . CVS VITAMIN C 250 MG tablet Take 250 mg by mouth daily.    . fluticasone (FLONASE) 50 MCG/ACT nasal spray Place 1 spray daily as needed into both nostrils for allergies. 16 g 11  . hydrochlorothiazide (MICROZIDE) 12.5 MG capsule Take 1 capsule (12.5 mg total) by mouth daily.    . insulin aspart (NOVOLOG) 100 UNIT/ML injection Sliding scale  CBG 70 - 120: 0 units  CBG 121 - 150: 1 unit,   CBG 151 - 200: 2 units,   CBG 201 - 250: 3 units,   CBG 251 - 300: 5 units,    CBG 301 - 350: 7 units,   CBG 351 - 400: 9 units    CBG > 400: 9 units and notify your MD 10 mL 1  . insulin detemir (LEVEMIR) 100 UNIT/ML injection Inject 0.13 mLs (13 Units total) into the skin 2 (two) times daily. 10 mL 1  . lisinopril (ZESTRIL) 10 MG tablet Take 1 tablet (10 mg total) by mouth daily.    . Multiple Vitamins-Minerals (MULTIVITAMIN ADULT) TABS Take 1 tablet by mouth daily.    Marland Kitchen nystatin-triamcinolone (MYCOLOG II) cream SMARTSIG:Sparingly Topical Daily    . nystatin-triamcinolone ointment (MYCOLOG) APPLY TO AFFECTED AREA TWICE DAILY (Patient taking differently: Apply 1 application topically 2 (two) times daily. ) 120 g 11  . omeprazole (PRILOSEC) 40 MG capsule TAKE 1 CAPSULE BY MOUTH EVERY DAY 90 capsule 3  . potassium chloride SA (KLOR-CON M20) 20 MEQ tablet Take 1 tablet (20 mEq total) by mouth daily.  0  . zolpidem (AMBIEN) 5 MG tablet Take 5 mg by mouth at bedtime as needed.    . blood glucose meter kit and supplies KIT Dispense based on patient and insurance preference. Use up to four times daily as directed. (FOR ICD-9 250.00, 250.01). 1 each 0   No current facility-administered medications for this visit.  Allergies: Hydrocodone  Past Medical History:  Diagnosis Date  . Arthritis   . Chest wall pain   . Diabetes mellitus without complication ([place])   . Hypertension     Past Surgical History:  Procedure Laterality Date  . aneurism repair    . lapband      Family History  Problem Relation Age of Onset  . Stroke Mother   . Hypertension Mother   . Arthritis Mother   . Diabetes Mother   . Arthritis Father   . Hypertension Father   . Diabetes Father     Social History   Tobacco Use  . Smoking status: Never Smoker  . Smokeless tobacco: Never Used  Substance Use Topics  . Alcohol use: No    Subjective:  Patient is accompanied by her husband; unfortunately, she had stroke on  6/17 and was in the hospital until 7/24; was discharged to a nursing home/  rehab and discharged from there on 8/24; unfortunately, there are no papers from the nursing home; patient did not bring medications and there is confusion about medications;  Also mentions right knee pain and right thumb pain;  Does not have neurology follow-up scheduled- neither patient nor husband are sure who she saw in the hospital;   Objective:  Vitals:   01/01/20 1138  BP: 124/72  Pulse: 74  Temp: 98.2 F (36.8 C)  TempSrc: Oral  SpO2: 99%  Weight: 158 lb 6.4 oz (71.8 kg)  Height: 5' 2"  (1.575 m)    General: Well developed, well nourished, in no acute distress  Skin : Warm and dry.  Head: Normocephalic and atraumatic  Eyes: Sclera and conjunctiva clear; pupils round and reactive to light; extraocular movements intact  Ears: External normal; canals clear; tympanic membranes normal  Oropharynx: Pink, supple. No suspicious lesions  Neck: Supple without thyromegaly, adenopathy  Lungs: Respirations unlabored; clear to auscultation bilaterally without wheeze, rales, rhonchi  CVS exam: normal rate and regular rhythm.  Musculoskeletal: No deformities; no active joint inflammation  Extremities: No edema, cyanosis, clubbing  Vessels: Symmetric bilaterally  Neurologic: Alert and oriented; speech intact; face symmetrical; moves all extremities well; CNII-XII intact without focal deficit  Assessment:  1. Hx of completed stroke   2. Chronic pain of right knee   3. Right hand pain   4. Essential hypertension   5. Low magnesium level   6. Diabetes mellitus type 2 in obese ([place])     Plan:  1. Refer to neurology; 2. Update Xray right knee; can try OTC Voltaren gel; will need to see orthopedist most likely; 3. Update Xray right hand; 4. Stable; stay on Lisinopril, Coreg, HCTZ; check CBC, CMP today;  5. Check magnesium level today;  6. Will re-start Levemir 13 units bid and refill Novolog for sliding scale; extensive medication review done today- called pharmacy for medication  clarification as well;   Patient understands she will need to start seeing her PCP very regularly for follow-up of diabetes/ hypertension; follow-up to be determined based on labs today;  Time spent 50 minutes with patient and husband today addressing numerous concerns  No follow-ups on file.  Orders Placed This Encounter  Procedures  . DG Knee Complete 4 Views Right    Standing Status:   Future    Number of Occurrences:   1    Standing Expiration Date:   12/31/2020    Order Specific Question:   Reason for Exam (SYMPTOM  OR DIAGNOSIS REQUIRED)    Answer:  right knee pain    Order Specific Question:   Is patient pregnant?    Answer:   No    Order Specific Question:   Preferred imaging location?    Answer:   [facility]    Order Specific Question:   Radiology Contrast Protocol - do NOT remove file path    Answer:   [email or web address]  . DG Hand Complete Right    Standing Status:   Future    Number of Occurrences:   1    Standing Expiration Date:   12/31/2020    Order Specific Question:   Reason for Exam (SYMPTOM  OR DIAGNOSIS REQUIRED)    Answer:   right hand pain    Order Specific Question:   Is patient pregnant?    Answer:   No    Order Specific Question:   Preferred imaging location?    Answer:   [facility]    Order Specific Question:   Radiology Contrast Protocol - do NOT remove file path    Answer:   [email or web address].pdf  . CBC with Differential/Platelet    Standing Status:   Future    Number of Occurrences:   1    Standing Expiration Date:   12/31/2020  . Comp Met (CMET)    Standing Status:   Future    Number of Occurrences:   1    Standing Expiration Date:   12/31/2020  . Magnesium    Standing Status:   Future    Number of Occurrences:   1    Standing Expiration Date:   12/31/2020  . HgB A1c    Standing Status:   Future    Number of Occurrences:   1     Standing Expiration Date:   12/31/2020  . Ambulatory referral to Neurology    Referral Priority:   Routine    Referral Type:   Consultation    Referral Reason:   Specialty Services Required    Requested Specialty:   Neurology    Number of Visits Requested:   1    Requested Prescriptions   Signed Prescriptions Disp Refills  . insulin detemir (LEVEMIR) 100 UNIT/ML injection 10 mL 1    Sig: Inject 0.13 mLs (13 Units total) into the skin 2 (two) times daily.  . insulin aspart (NOVOLOG) 100 UNIT/ML injection 10 mL 1    Sig: Sliding scale  CBG 70 - 120: 0 units  CBG 121 - 150: 1 unit,   CBG 151 - 200: 2 units,   CBG 201 - 250: 3 units,   CBG 251 - 300: 5 units,   CBG 301 - 350: 7 units,   CBG 351 - 400: 9 units    CBG > 400: 9 units and notify your MD  . blood glucose meter kit and supplies KIT 1 each 0    Sig: Dispense based on patient and insurance preference. Use up to four times daily as directed. (FOR ICD-9 250.00, 250.01).

## 2020-01-02 LAB — HEMOGLOBIN A1C
Hgb A1c MFr Bld: 8 % of total Hgb — ABNORMAL HIGH (ref <5.7)
Mean Plasma Glucose: 183 (calc)
eAG (mmol/L): 10.1 (calc)

## 2020-01-02 LAB — COMPREHENSIVE METABOLIC PANEL
AG Ratio: 1.6 (calc) (ref 1.0–2.5)
ALT: 9 U/L (ref 6–29)
AST: 14 U/L (ref 10–35)
Albumin: 4.2 g/dL (ref 3.6–5.1)
Alkaline phosphatase (APISO): 64 U/L (ref 37–153)
BUN: 14 mg/dL (ref 7–25)
CO2: 32 mmol/L (ref 20–32)
Calcium: 9.8 mg/dL (ref 8.6–10.4)
Chloride: 99 mmol/L (ref 98–110)
Creat: 0.84 mg/dL (ref 0.50–1.05)
Globulin: 2.7 g/dL (calc) (ref 1.9–3.7)
Glucose, Bld: 223 mg/dL — ABNORMAL HIGH (ref 65–99)
Potassium: 3.4 mmol/L — ABNORMAL LOW (ref 3.5–5.3)
Sodium: 144 mmol/L (ref 135–146)
Total Bilirubin: 1.3 mg/dL — ABNORMAL HIGH (ref 0.2–1.2)
Total Protein: 6.9 g/dL (ref 6.1–8.1)

## 2020-01-02 LAB — CBC WITH DIFFERENTIAL/PLATELET
Absolute Monocytes: 543 cells/uL (ref 200–950)
Basophils Absolute: 73 cells/uL (ref 0–200)
Basophils Relative: 0.9 %
Eosinophils Absolute: 73 cells/uL (ref 15–500)
Eosinophils Relative: 0.9 %
HCT: 38.5 % (ref 35.0–45.0)
Hemoglobin: 12.7 g/dL (ref 11.7–15.5)
Lymphs Abs: 2835 cells/uL (ref 850–3900)
MCH: 27.9 pg (ref 27.0–33.0)
MCHC: 33 g/dL (ref 32.0–36.0)
MCV: 84.4 fL (ref 80.0–100.0)
MPV: 11.4 fL (ref 7.5–12.5)
Monocytes Relative: 6.7 %
Neutro Abs: 4577 cells/uL (ref 1500–7800)
Neutrophils Relative %: 56.5 %
Platelets: 206 10*3/uL (ref 140–400)
RBC: 4.56 10*6/uL (ref 3.80–5.10)
RDW: 13.4 % (ref 11.0–15.0)
Total Lymphocyte: 35 %
WBC: 8.1 10*3/uL (ref 3.8–10.8)

## 2020-01-02 LAB — MAGNESIUM: Magnesium: 2.1 mg/dL (ref 1.5–2.5)

## 2020-01-05 ENCOUNTER — Other Ambulatory Visit: Payer: Self-pay | Admitting: Family

## 2020-01-05 ENCOUNTER — Ambulatory Visit: Payer: No Typology Code available for payment source | Admitting: Family

## 2020-01-05 DIAGNOSIS — M25561 Pain in right knee: Secondary | ICD-10-CM

## 2020-01-06 ENCOUNTER — Other Ambulatory Visit: Payer: Self-pay | Admitting: Internal Medicine

## 2020-01-13 ENCOUNTER — Other Ambulatory Visit: Payer: Self-pay | Admitting: Internal Medicine

## 2020-01-27 ENCOUNTER — Other Ambulatory Visit: Payer: Self-pay | Admitting: Internal Medicine

## 2020-01-27 MED ORDER — POTASSIUM CHLORIDE CRYS ER 20 MEQ PO TBCR: 20.0000 meq | EXTENDED_RELEASE_TABLET | Freq: Every day | ORAL | 1 refills | Status: DC

## 2020-01-27 MED ORDER — NYSTATIN-TRIAMCINOLONE 100000-0.1 UNIT/GM-% EX CREA: TOPICAL_CREAM | CUTANEOUS | 1 refills | Status: AC

## 2020-01-27 MED ORDER — CVS VITAMIN C 250 MG PO TABS: 250.0000 mg | ORAL_TABLET | Freq: Every day | ORAL | 1 refills | Status: AC

## 2020-01-27 MED ORDER — CVS STOOL SOFTENER 100 MG PO CAPS: 100.0000 mg | ORAL_CAPSULE | Freq: Every day | ORAL | 1 refills | Status: AC

## 2020-01-27 MED ORDER — ZOLPIDEM TARTRATE 5 MG PO TABS: 5.0000 mg | ORAL_TABLET | Freq: Every evening | ORAL | 0 refills | Status: AC | PRN

## 2020-01-27 MED ORDER — OMEPRAZOLE 40 MG PO CPDR: DELAYED_RELEASE_CAPSULE | ORAL | 1 refills | Status: AC

## 2020-01-27 NOTE — Telephone Encounter (Signed)
Patient is requesting a refill on the following medications :  zolpidem (AMBIEN) 5 MG tablet  potassium chloride SA (KLOR-CON M20) 20 MEQ tablet  omeprazole (PRILOSEC) 40 MG capsule CVS VITAMIN C 250 MG tablet  CVS STOOL SOFTENER 100 MG capsule  nystatin-triamcinolone ointment (MYCOLOG)  [id number] [place], [place] - [address] Phone:  [phone]  Fax:  [phone]     Patient would like a call once RX's have been sent.

## 2020-02-08 ENCOUNTER — Telehealth: Payer: Self-pay | Admitting: Internal Medicine

## 2020-02-08 MED ORDER — ATORVASTATIN CALCIUM 40 MG PO TABS: 40.0000 mg | ORAL_TABLET | Freq: Every day | ORAL | 1 refills | Status: AC

## 2020-02-08 MED ORDER — HYDROCHLOROTHIAZIDE 12.5 MG PO CAPS: 12.5000 mg | ORAL_CAPSULE | Freq: Every day | ORAL | 1 refills | Status: AC

## 2020-02-08 MED ORDER — LISINOPRIL 10 MG PO TABS: 10.0000 mg | ORAL_TABLET | Freq: Every day | ORAL | 1 refills | Status: AC

## 2020-02-08 MED ORDER — CARVEDILOL 25 MG PO TABS: 25.0000 mg | ORAL_TABLET | Freq: Two times a day (BID) | ORAL | 5 refills | Status: AC

## 2020-02-08 MED ORDER — POTASSIUM CHLORIDE CRYS ER 20 MEQ PO TBCR: 20.0000 meq | EXTENDED_RELEASE_TABLET | Freq: Every day | ORAL | 1 refills | Status: AC

## 2020-02-08 NOTE — Telephone Encounter (Signed)
atorvastatin (LIPITOR) 40 MG tablet  hydrochlorothiazide (MICROZIDE) 12.5 MG capsule  potassium chloride SA (KLOR-CON M20) 20 MEQ tablet  lisinopril (ZESTRIL) 10 MG tablet   carvedilol (COREG) 25 MG tablet   [id number] - [place], [place] - [address] Phone:  [phone]  Fax:  [phone]     Last appt: 9.7.21 Ria Clock)  Scripts last filled at Rehab, but now need to be filled by her PCP

## 2020-02-22 ENCOUNTER — Emergency Department (HOSPITAL_COMMUNITY): Payer: No Typology Code available for payment source

## 2020-02-22 ENCOUNTER — Inpatient Hospital Stay (HOSPITAL_COMMUNITY)
Admission: EM | Admit: 2020-02-22 | Discharge: 2020-02-29 | DRG: 064 | Disposition: E | Payer: No Typology Code available for payment source | Attending: Neurology | Admitting: Neurology

## 2020-02-22 DIAGNOSIS — I129 Hypertensive chronic kidney disease with stage 1 through stage 4 chronic kidney disease, or unspecified chronic kidney disease: Secondary | ICD-10-CM | POA: Diagnosis present

## 2020-02-22 DIAGNOSIS — Z8249 Family history of ischemic heart disease and other diseases of the circulatory system: Secondary | ICD-10-CM

## 2020-02-22 DIAGNOSIS — G908 Other disorders of autonomic nervous system: Secondary | ICD-10-CM | POA: Diagnosis present

## 2020-02-22 DIAGNOSIS — I615 Nontraumatic intracerebral hemorrhage, intraventricular: Secondary | ICD-10-CM | POA: Diagnosis not present

## 2020-02-22 DIAGNOSIS — Z885 Allergy status to narcotic agent status: Secondary | ICD-10-CM

## 2020-02-22 DIAGNOSIS — I619 Nontraumatic intracerebral hemorrhage, unspecified: Secondary | ICD-10-CM | POA: Diagnosis not present

## 2020-02-22 DIAGNOSIS — Z794 Long term (current) use of insulin: Secondary | ICD-10-CM

## 2020-02-22 DIAGNOSIS — Z66 Do not resuscitate: Secondary | ICD-10-CM | POA: Diagnosis not present

## 2020-02-22 DIAGNOSIS — R29722 NIHSS score 22: Secondary | ICD-10-CM | POA: Diagnosis present

## 2020-02-22 DIAGNOSIS — N1831 Chronic kidney disease, stage 3a: Secondary | ICD-10-CM | POA: Diagnosis present

## 2020-02-22 DIAGNOSIS — Z79899 Other long term (current) drug therapy: Secondary | ICD-10-CM

## 2020-02-22 DIAGNOSIS — G935 Compression of brain: Secondary | ICD-10-CM | POA: Diagnosis not present

## 2020-02-22 DIAGNOSIS — Z833 Family history of diabetes mellitus: Secondary | ICD-10-CM

## 2020-02-22 DIAGNOSIS — I959 Hypotension, unspecified: Secondary | ICD-10-CM | POA: Diagnosis not present

## 2020-02-22 DIAGNOSIS — R4020 Unspecified coma: Secondary | ICD-10-CM | POA: Diagnosis present

## 2020-02-22 DIAGNOSIS — E1122 Type 2 diabetes mellitus with diabetic chronic kidney disease: Secondary | ICD-10-CM | POA: Diagnosis present

## 2020-02-22 DIAGNOSIS — J9600 Acute respiratory failure, unspecified whether with hypoxia or hypercapnia: Secondary | ICD-10-CM | POA: Diagnosis present

## 2020-02-22 DIAGNOSIS — I160 Hypertensive urgency: Secondary | ICD-10-CM

## 2020-02-22 DIAGNOSIS — M7989 Other specified soft tissue disorders: Secondary | ICD-10-CM | POA: Diagnosis not present

## 2020-02-22 DIAGNOSIS — I614 Nontraumatic intracerebral hemorrhage in cerebellum: Secondary | ICD-10-CM | POA: Diagnosis not present

## 2020-02-22 DIAGNOSIS — Z8679 Personal history of other diseases of the circulatory system: Secondary | ICD-10-CM

## 2020-02-22 DIAGNOSIS — Z823 Family history of stroke: Secondary | ICD-10-CM

## 2020-02-22 DIAGNOSIS — R34 Anuria and oliguria: Secondary | ICD-10-CM | POA: Diagnosis not present

## 2020-02-22 DIAGNOSIS — R531 Weakness: Secondary | ICD-10-CM

## 2020-02-22 DIAGNOSIS — Z8261 Family history of arthritis: Secondary | ICD-10-CM

## 2020-02-22 DIAGNOSIS — Z8673 Personal history of transient ischemic attack (TIA), and cerebral infarction without residual deficits: Secondary | ICD-10-CM

## 2020-02-22 DIAGNOSIS — Z515 Encounter for palliative care: Secondary | ICD-10-CM

## 2020-02-22 DIAGNOSIS — E1165 Type 2 diabetes mellitus with hyperglycemia: Secondary | ICD-10-CM | POA: Diagnosis present

## 2020-02-22 DIAGNOSIS — Z20822 Contact with and (suspected) exposure to covid-19: Secondary | ICD-10-CM | POA: Diagnosis present

## 2020-02-22 DIAGNOSIS — I161 Hypertensive emergency: Secondary | ICD-10-CM | POA: Diagnosis present

## 2020-02-22 DIAGNOSIS — E876 Hypokalemia: Secondary | ICD-10-CM | POA: Diagnosis present

## 2020-02-22 DIAGNOSIS — I610 Nontraumatic intracerebral hemorrhage in hemisphere, subcortical: Secondary | ICD-10-CM | POA: Diagnosis present

## 2020-02-22 DIAGNOSIS — E785 Hyperlipidemia, unspecified: Secondary | ICD-10-CM | POA: Diagnosis present

## 2020-02-22 DIAGNOSIS — G936 Cerebral edema: Secondary | ICD-10-CM | POA: Diagnosis present

## 2020-02-22 LAB — CBG MONITORING, ED: Glucose-Capillary: 282 mg/dL — ABNORMAL HIGH (ref 70–99)

## 2020-02-22 LAB — DIFFERENTIAL
Abs Immature Granulocytes: 0.02 10*3/uL (ref 0.00–0.07)
Basophils Absolute: 0.1 10*3/uL (ref 0.0–0.1)
Basophils Relative: 1 %
Eosinophils Absolute: 0.1 10*3/uL (ref 0.0–0.5)
Eosinophils Relative: 1 %
Immature Granulocytes: 0 %
Lymphocytes Relative: 45 %
Lymphs Abs: 3.9 10*3/uL (ref 0.7–4.0)
Monocytes Absolute: 0.7 10*3/uL (ref 0.1–1.0)
Monocytes Relative: 8 %
Neutro Abs: 3.8 10*3/uL (ref 1.7–7.7)
Neutrophils Relative %: 45 %

## 2020-02-22 LAB — COMPREHENSIVE METABOLIC PANEL
ALT: 14 U/L (ref 0–44)
AST: 20 U/L (ref 15–41)
Albumin: 4.4 g/dL (ref 3.5–5.0)
Alkaline Phosphatase: 67 U/L (ref 38–126)
Anion gap: 15 (ref 5–15)
BUN: 19 mg/dL (ref 6–20)
CO2: 24 mmol/L (ref 22–32)
Calcium: 9.6 mg/dL (ref 8.9–10.3)
Chloride: 99 mmol/L (ref 98–111)
Creatinine, Ser: 1.19 mg/dL — ABNORMAL HIGH (ref 0.44–1.00)
GFR, Estimated: 55 mL/min — ABNORMAL LOW (ref >60)
Glucose, Bld: 290 mg/dL — ABNORMAL HIGH (ref 70–99)
Potassium: 2.8 mmol/L — ABNORMAL LOW (ref 3.5–5.1)
Sodium: 138 mmol/L (ref 135–145)
Total Bilirubin: 1.2 mg/dL (ref 0.3–1.2)
Total Protein: 7.4 g/dL (ref 6.5–8.1)

## 2020-02-22 LAB — PROTIME-INR
INR: 1 (ref 0.8–1.2)
Prothrombin Time: 12.7 seconds (ref 11.4–15.2)

## 2020-02-22 LAB — CBC
HCT: 45 % (ref 36.0–46.0)
Hemoglobin: 14.2 g/dL (ref 12.0–15.0)
MCH: 27.3 pg (ref 26.0–34.0)
MCHC: 31.6 g/dL (ref 30.0–36.0)
MCV: 86.5 fL (ref 80.0–100.0)
Platelets: 183 10*3/uL (ref 150–400)
RBC: 5.2 MIL/uL — ABNORMAL HIGH (ref 3.87–5.11)
RDW: 13.2 % (ref 11.5–15.5)
WBC: 8.5 10*3/uL (ref 4.0–10.5)
nRBC: 0 % (ref 0.0–0.2)

## 2020-02-22 LAB — I-STAT CHEM 8, ED
BUN: 25 mg/dL — ABNORMAL HIGH (ref 6–20)
Calcium, Ion: 1.06 mmol/L — ABNORMAL LOW (ref 1.15–1.40)
Chloride: 100 mmol/L (ref 98–111)
Creatinine, Ser: 1.1 mg/dL — ABNORMAL HIGH (ref 0.44–1.00)
Glucose, Bld: 294 mg/dL — ABNORMAL HIGH (ref 70–99)
HCT: 44 % (ref 36.0–46.0)
Hemoglobin: 15 g/dL (ref 12.0–15.0)
Potassium: 2.9 mmol/L — ABNORMAL LOW (ref 3.5–5.1)
Sodium: 138 mmol/L (ref 135–145)
TCO2: 28 mmol/L (ref 22–32)

## 2020-02-22 LAB — ETHANOL: Alcohol, Ethyl (B): <10 mg/dL (ref <10)

## 2020-02-22 LAB — APTT: aPTT: 26 seconds (ref 24–36)

## 2020-02-22 LAB — I-STAT BETA HCG BLOOD, ED (MC, WL, AP ONLY): I-stat hCG, quantitative: <5 m[IU]/mL (ref <5)

## 2020-02-22 NOTE — Code Documentation (Signed)
Stroke Response Nurse Documentation Code Documentation  Manpreet Kemmer is a 54 y.o. female arriving to [facility] ED via [facility] on 10/25 with past medical hx of ICH, HTN. Code stroke was activated by EMS. Patient from nsg home where she was LKW at 2215 and now complaining of HA, vomiting, increased weakness on Right . On No antithrombotic. Stroke team at the bedside on patient arrival. Labs drawn and patient cleared for CT by Dr. Preston Fleeting. Patient to CT with team. NIHSS 31, see documentation for details and code stroke times. Patient with decreased LOC, disoriented, not following commands, right gaze preference , bilateral hemianopia, right arm weakness, right leg weakness, right decreased sensation, Global aphasia  and dysarthria  on exam. The following imaging was completed:  CT. Patient is not a candidate for tPA due to ICH.Labetolol, Clevi, propofol for sedation/BP control. Bedside handoff with ED RN Aleda Grana.    Rose Fillers  Rapid Response RN

## 2020-02-22 NOTE — H&P (Signed)
Neurology H&P  CC: acute loss of consciousness and brought as a code stroke.  History is obtained from: EMS and chart  HPI: Latoya Cox is a 54 y.o. female PMHx HTN, DM and recent intracranial hemorrhage in her usual state of health until about 2215 when her husband noted that she was having difficulty talking and not moving her right side. EMS arrived and noted nonverbal with right gaze and no right extremity movement. She was noted to have non-projectile emesis on arrival.    Chart notes long inpatient stay and rehabilitation and ultimately discharged home. Baseline level of function is not clear.    She does have some response to painful stimuli, but minimal.  She did have spontaneous movement of her left side.   LKW: 2215 on 02-23-2020 NIHSS: 22  ROS: Unable to obtain due to altered mental status.   Past Medical History:  Diagnosis Date  . Arthritis   . Chest wall pain   . Diabetes mellitus without complication ([place])   . Hypertension      Family History  Problem Relation Age of Onset  . Stroke Mother   . Hypertension Mother   . Arthritis Mother   . Diabetes Mother   . Arthritis Father   . Hypertension Father   . Diabetes Father     Social History:  reports that she has never smoked. She has never used smokeless tobacco. She reports that she does not drink alcohol and does not use drugs.   Prior to Admission medications   Medication Sig Start Date End Date Taking? Authorizing Provider  Ascorbic Acid (VITAMIN C) 100 MG tablet Take 100 mg by mouth daily.    [provider]  atorvastatin (LIPITOR) 40 MG tablet Take 1 tablet (40 mg total) by mouth daily. 02/08/20   Hoyt Koch, MD  Benzoyl Peroxide 10 % CREA Use on face twice daily 10/05/19   Hoyt Koch, MD  blood glucose meter kit and supplies KIT Dispense based on patient and insurance preference. Use up to four times daily as directed. (FOR ICD-9 250.00, 250.01). 01/01/20   Marrian Salvage, FNP  carvedilol (COREG) 25 MG tablet Take 1 tablet (25 mg total) by mouth 2 (two) times daily with a meal. 02/08/20   Hoyt Koch, MD  CLEVER CHEK LANCETS MISC Use daily to check sugars. 08/30/15   Hoyt Koch, MD  CVS STOOL SOFTENER 100 MG capsule Take 1 capsule (100 mg total) by mouth daily. 01/27/20   Hoyt Koch, MD  CVS VITAMIN C 250 MG tablet Take 1 tablet (250 mg total) by mouth daily. Take 250 mg by mouth daily. 01/27/20   Hoyt Koch, MD  fluticasone Asencion Islam) 50 MCG/ACT nasal spray Place 1 spray daily as needed into both nostrils for allergies. 03/04/17   Hoyt Koch, MD  hydrochlorothiazide (MICROZIDE) 12.5 MG capsule Take 1 capsule (12.5 mg total) by mouth daily. 02/08/20   Hoyt Koch, MD  insulin aspart (NOVOLOG) 100 UNIT/ML injection Sliding scale  CBG 70 - 120: 0 units  CBG 121 - 150: 1 unit,   CBG 151 - 200: 2 units,   CBG 201 - 250: 3 units,   CBG 251 - 300: 5 units,   CBG 301 - 350: 7 units,   CBG 351 - 400: 9 units    CBG > 400: 9 units and notify your MD 01/01/20   Marrian Salvage, FNP  insulin detemir (LEVEMIR) 100 UNIT/ML injection  Inject 0.13 mLs (13 Units total) into the skin 2 (two) times daily. 01/01/20   Marrian Salvage, FNP  lisinopril (ZESTRIL) 10 MG tablet Take 1 tablet (10 mg total) by mouth daily. 02/08/20   Hoyt Koch, MD  Multiple Vitamins-Minerals (MULTIVITAMIN ADULT) TABS Take 1 tablet by mouth daily.    [provider]  nystatin-triamcinolone [facility] II) cream Sparingly Topical Daily 01/27/20   Hoyt Koch, MD  nystatin-triamcinolone ointment ([place]) APPLY TO AFFECTED AREA TWICE DAILY Patient taking differently: Apply 1 application topically 2 (two) times daily.  01/29/17   Hoyt Koch, MD  omeprazole (PRILOSEC) 40 MG capsule TAKE 1 CAPSULE BY MOUTH EVERY DAY 01/27/20   Hoyt Koch, MD  potassium chloride SA (KLOR-CON M20) 20 MEQ  tablet Take 1 tablet (20 mEq total) by mouth daily. 02/08/20   Hoyt Koch, MD  zolpidem (AMBIEN) 5 MG tablet Take 1 tablet (5 mg total) by mouth at bedtime as needed. 01/27/20   Binnie Rail, MD   Exam:  Current vital signs: There were no vitals taken for this visit.  Physical Exam  Constitutional: Appears well-developed and well-nourished.  Psych: Unable to assess due to altered mental status. Eyes: No scleral injection HENT: No OP obstrucion Head: Normocephalic.  Cardiovascular: Normal rate and regular rhythm.  Respiratory: Effort normal and breath sounds normal to anterior ascultation GI: Soft. No distension. There is no tenderness.  Skin: WDI  Neuro: Mental Status: Unable to assess due to altered mental status. Cranial Nerves: Unable to fully assess due to altered mental status. Pupils are equal, round, ~35m sluggishly reactive to light.   Left gaze deviation.  Motor: Unable to assess due to altered mental status. Tone is normal. Bulk is normal. Voluntarily moves left extremities inconsistently. Sensory: Unable to assess due to altered mental status. Plantars: Mute. Cerebellar: Unable to assess due to altered mental status.  I have reviewed the images obtained: NCT head showed intraparenchymal hematoma centered at the left basal ganglia measures 4.6 x 4.0 x 3.4 cm (volume = 33 cm^3). 4 mm of rightward midline shift. There is a large amount of intraventricular extension into the lateral and third ventricles. Early entrapment of the right temporal horn.  Primary Diagnosis:  Left intraparenchymal hemorrhagic stroke.  Secondary Diagnosis: Brain compression  Assessment:  EAmina Menchacais a 54y.o. female PMHx HTN, DM and recent left intracranial hemorrhage s/p EVD returns with larger left hemorrhagic stroke and hypertensive emergency.  Impression:  Left hemispheric stroke - Hemorrhagic with intraventricular extension. ICH score 3. Brain compression - Early  entrapment of the right temporal horn. Hypertensive emergency. Acute coma. Right sided weakness. Left gaze deviation.   Plan: Admit to NSICU. Neurosurgery felt there was no intervention at this point - Appreciate evaluation. Frequent neurochecks. Start clevidipine drip and labetalol [id number] minutes target SBP <140. 3% Na at 75cc/hr with serum Na goal of 145-155 for now. Repeat CT head in 6 hours. TTE. Cardiac telemetry No antiplatelets or anticoagulants for now. PT/OT/ST consults. NPO  with swabws until ST evaluation. DVT PPx: SCDs for now.   This patient is critically ill and at significant risk of neurological worsening, death and care requires constant monitoring of vital signs, hemodynamics,respiratory and cardiac monitoring, neurological assessment, discussion with family, other specialists and medical decision making of high complexity. I spent 75 minutes of neurocritical care time  in the care of  this patient.   Electronically signed by: Dr. HuLynnae Sandhoffager: [phone]/25/2021, 11:33  PM

## 2020-02-22 NOTE — ED Provider Notes (Signed)
  Physical Exam  There were no vitals taken for this visit.  Physical Exam Vitals reviewed.  HENT:     Head: Normocephalic and atraumatic.     Nose: Nose normal.     Mouth/Throat:     Mouth: Mucous membranes are moist.     Pharynx: Oropharynx is clear.  Eyes:     Comments: Eyes deviate to R  Neurological:     Mental Status: She is unresponsive.     ED Course/Procedures     Procedure Name: Intubation Date/Time: 01/30/2020 11:47 PM Performed by: Roosevelt Locks, MD Pre-anesthesia Checklist: Patient identified, Suction available, Patient being monitored and Timeout performed Oxygen Delivery Method: Ambu bag Preoxygenation: Pre-oxygenation with 100% oxygen Induction Type: Rapid sequence Ventilation: Mask ventilation without difficulty Laryngoscope Size: Mac and 4 Grade View: Grade II Tube size: 7.5 mm Number of attempts: 1 Airway Equipment and Method: Rigid stylet Placement Confirmation: ETT inserted through vocal cords under direct vision,  Positive ETCO2 and Breath sounds checked- equal and bilateral Secured at: 22 cm Tube secured with: ETT holder Dental Injury: Teeth and Oropharynx as per pre-operative assessment        MDM         Roosevelt Locks, MD 95/28/41 [id number]    Delora Fuel, MD 05/01/70 0009

## 2020-02-22 NOTE — ED Provider Notes (Addendum)
[facility] EMERGENCY DEPARTMENT Provider Note   CSN: [id number] Arrival date & time: 02/19/2020  2308   History Chief complaint: Code stroke  Latoya Cox is a 54 y.o. female.  The history is provided by the EMS personnel. The history is limited by the condition of the patient (Unresponsive).  She has history of hypertension, diabetes, prior intracranial hemorrhage and was brought in by EMS as a code stroke.  She had been in her usual state of health until about 10:15 PM when her husband noted that she was having difficulty talking and she was not moving her right side.  EMS arrived and noted gaze to the right and no movement of the right side.  She did have spontaneous movement of her left side.  She did have emesis after their arrival.  She does have some response to painful stimuli, but minimal.  Past Medical History:  Diagnosis Date  . Arthritis   . Chest wall pain   . Diabetes mellitus without complication ([place])   . Hypertension     Patient Active Problem List   Diagnosis Date Noted  . Acute hypoxemic respiratory failure ([place])   . Altered mental status   . 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 Acne 10/05/2019  . Intractable nausea and vomiting 01/14/2019  . Lactic acidosis 01/14/2019  . QT prolongation 01/14/2019  . Suspected COVID-19 virus infection 12/11/2018  . Hx of completed stroke 08/28/2018  . Hypokalemia 10/18/2017  . Overweight (BMI 25.0-29.9) 05/14/2017  . Routine general medical examination at a health care facility 08/02/2016  . Neck fullness 01/27/2016  . Diabetes mellitus type 2 in obese ([place]) 05/17/2015  . Essential hypertension 05/17/2015  . Vitamin D deficiency 05/17/2015    Past Surgical History:  Procedure Laterality Date  . aneurism repair    . lapband       OB History    Gravida  3   Para      Term      Preterm      AB  3   Living        SAB  3   TAB      Ectopic       Multiple      Live Births              Family History  Problem Relation Age of Onset  . Stroke Mother   . Hypertension Mother   . Arthritis Mother   . Diabetes Mother   . Arthritis Father   . Hypertension Father   . Diabetes Father     Social History   Tobacco Use  . Smoking status: Never Smoker  . Smokeless tobacco: Never Used  Substance Use Topics  . Alcohol use: No  . Drug use: No    Home Medications Prior to Admission medications   Medication Sig Start Date End Date Taking? Authorizing Provider  Ascorbic Acid (VITAMIN C) 100 MG tablet Take 100 mg by mouth daily.    [provider]  atorvastatin (LIPITOR) 40 MG tablet Take 1 tablet (40 mg total) by mouth daily. 02/08/20   Hoyt Koch, MD  Benzoyl Peroxide 10 % CREA Use on face twice daily 10/05/19   Hoyt Koch, MD  blood glucose meter kit and supplies KIT Dispense based on patient and insurance preference. Use up to four times daily as directed. (FOR ICD-9 250.00, 250.01). 01/01/20   Marrian Salvage,  FNP  carvedilol (COREG) 25 MG tablet Take 1 tablet (25 mg total) by mouth 2 (two) times daily with a meal. 02/08/20   Hoyt Koch, MD  CLEVER CHEK LANCETS MISC Use daily to check sugars. 08/30/15   Hoyt Koch, MD  [employer] STOOL SOFTENER 100 MG capsule Take 1 capsule (100 mg total) by mouth daily. 01/27/20   Hoyt Koch, MD  [employer] VITAMIN C 250 MG tablet Take 1 tablet (250 mg total) by mouth daily. Take 250 mg by mouth daily. 01/27/20   Hoyt Koch, MD  fluticasone Asencion Islam) 50 MCG/ACT nasal spray Place 1 spray daily as needed into both nostrils for allergies. 03/04/17   Hoyt Koch, MD  hydrochlorothiazide (MICROZIDE) 12.5 MG capsule Take 1 capsule (12.5 mg total) by mouth daily. 02/08/20   Hoyt Koch, MD  insulin aspart (NOVOLOG) 100 UNIT/ML injection Sliding scale  CBG 70 - 120: 0 units  CBG 121 - 150: 1 unit,   CBG 151 - 200: 2 units,    CBG 201 - 250: 3 units,   CBG 251 - 300: 5 units,   CBG 301 - 350: 7 units,   CBG 351 - 400: 9 units    CBG > 400: 9 units and notify your MD 01/01/20   Marrian Salvage, FNP  insulin detemir (LEVEMIR) 100 UNIT/ML injection Inject 0.13 mLs (13 Units total) into the skin 2 (two) times daily. 01/01/20   Marrian Salvage, FNP  lisinopril (ZESTRIL) 10 MG tablet Take 1 tablet (10 mg total) by mouth daily. 02/08/20   Hoyt Koch, MD  Multiple Vitamins-Minerals (MULTIVITAMIN ADULT) TABS Take 1 tablet by mouth daily.    [provider]  nystatin-triamcinolone [facility] II) cream Sparingly Topical Daily 01/27/20   Hoyt Koch, MD  nystatin-triamcinolone ointment ([place]) APPLY TO AFFECTED AREA TWICE DAILY Patient taking differently: Apply 1 application topically 2 (two) times daily.  01/29/17   Hoyt Koch, MD  omeprazole (PRILOSEC) 40 MG capsule TAKE 1 CAPSULE BY MOUTH EVERY DAY 01/27/20   Hoyt Koch, MD  potassium chloride SA (KLOR-CON M20) 20 MEQ tablet Take 1 tablet (20 mEq total) by mouth daily. 02/08/20   Hoyt Koch, MD  zolpidem (AMBIEN) 5 MG tablet Take 1 tablet (5 mg total) by mouth at bedtime as needed. 01/27/20   Binnie Rail, MD    Allergies    Hydrocodone  Review of Systems   Review of Systems  Unable to perform ROS: Patient unresponsive    Physical Exam Updated Vital Signs BP (!) 214/121   Pulse 97   Resp 18   Ht 5' 2"  (1.575 m)   SpO2 97%   BMI 28.97 kg/m   Physical Exam Vitals and nursing note reviewed.   54 year old female, resting comfortably and in no acute distress. Vital signs are significant for elevated blood pressure. Oxygen saturation is 97%, which is normal. Head is normocephalic and atraumatic.  Eyes are deviated to the right.  Pupils are 3 mm and sluggishly reactive. Neck is supple without adenopathy or JVD. Lungs are clear without rales, wheezes, or rhonchi. Chest moves  symmetrically. Heart tones are distant without murmur. Abdomen is soft, flat. Extremities have no cyanosis or edema. Skin is warm and dry without rash. Neurologic: Nonverbal, minimal movement to painful stimuli.  Eye deviation as noted above.  No spontaneous movement noted on the right side.  She does have spontaneous movement of the left arm and  left leg.  ED Results / Procedures / Treatments   Labs (all labs ordered are listed, but only abnormal results are displayed) Labs Reviewed  CBC - Abnormal; Notable for the following components:      Result Value   RBC 5.20 (*)    All other components within normal limits  COMPREHENSIVE METABOLIC PANEL - Abnormal; Notable for the following components:   Potassium 2.8 (*)    Glucose, Bld 290 (*)    Creatinine, Ser 1.19 (*)    GFR, Estimated 55 (*)    All other components within normal limits  I-STAT CHEM 8, ED - Abnormal; Notable for the following components:   Potassium 2.9 (*)    BUN 25 (*)    Creatinine, Ser 1.10 (*)    Glucose, Bld 294 (*)    Calcium, Ion 1.06 (*)    All other components within normal limits  CBG MONITORING, ED - Abnormal; Notable for the following components:   Glucose-Capillary 282 (*)    All other components within normal limits  ETHANOL  PROTIME-INR  APTT  DIFFERENTIAL  RAPID URINE DRUG SCREEN, HOSP PERFORMED  URINALYSIS, ROUTINE W REFLEX MICROSCOPIC  HIV ANTIBODY (ROUTINE TESTING W REFLEX)  OSMOLALITY  SODIUM  SODIUM  SODIUM  SODIUM  I-STAT BETA HCG BLOOD, ED ([facility], [facility], [facility] ONLY)  TYPE AND SCREEN    EKG EKG Interpretation  Date/Time:  Monday February 22 2020 23:41:41 EDT Ventricular Rate:  96 PR Interval:    QRS Duration: 109 QT Interval:  389 QTC Calculation: 492 R Axis:   9 Text Interpretation: Sinus rhythm Right atrial enlargement Left ventricular hypertrophy Anterior Q waves, possibly due to LVH Borderline T abnormalities, inferior leads When compared with ECG of 10/22/2019, HEART RATE has  decreased Confirmed by Delora Fuel ([id number]) on 02/17/2020 11:59:42 PM   Radiology DG Chest Portable 1 View  Result Date: 02/14/2020 CLINICAL DATA:  Intubation EXAM: PORTABLE CHEST 1 VIEW COMPARISON:  None. FINDINGS: The heart size and mediastinal contours are within normal limits. ETT is 2.8 cm above the level of the carina. NG tube is seen within the proximal stomach. No large airspace consolidation or pleural effusion. There is shallow degree of aeration with bibasilar subsegmental atelectasis. IMPRESSION: ETT and NG tube in satisfactory position. Bibasilar subsegmental atelectasis Electronically Signed   By: Prudencio Pair M.D.   On: 02/21/2020 23:59   CT HEAD CODE STROKE WO CONTRAST  Result Date: 02/07/2020 CLINICAL DATA:  Code stroke.  Right-sided weakness EXAM: CT HEAD WITHOUT CONTRAST TECHNIQUE: Contiguous axial images were obtained from the base of the skull through the vertex without intravenous contrast. COMPARISON:  None. FINDINGS: Brain: Intraparenchymal hematoma centered at the left basal ganglia measures 4.6 x 4.0 x 3.4 cm (volume = 33 cm^3). 4 mm of rightward midline shift. There is a large amount of intraventricular extension into the lateral and third ventricles. Early entrapment of the right temporal horn. Vascular: No abnormal hyperdensity of the major intracranial arteries or dural venous sinuses. No intracranial atherosclerosis. Skull: The visualized skull base, calvarium and extracranial soft tissues are normal. Sinuses/Orbits: No fluid levels or advanced mucosal thickening of the visualized paranasal sinuses. No mastoid or middle ear effusion. The orbits are normal. IMPRESSION: 1. Intraparenchymal hematoma centered at the left basal ganglia with intraventricular extension and 4 mm of rightward midline shift. 2. Early entrapment of the right temporal horn. Critical Value/emergent results were called by telephone at the time of interpretation on 01/29/2020 at 11:34 pm to provider  Lynnae Sandhoff, who verbally acknowledged these results. Electronically Signed   By: Ulyses Jarred M.D.   On: 02/04/2020 23:35    Procedures Procedures  CRITICAL CARE Performed by: Delora Fuel Total critical care time: 45 minutes Critical care time was exclusive of separately billable procedures and treating other patients. Critical care was necessary to treat or prevent imminent or life-threatening deterioration. Critical care was time spent personally by me on the following activities: development of treatment plan with patient and/or surrogate as well as nursing, discussions with consultants, evaluation of patient's response to treatment, examination of patient, obtaining history from patient or surrogate, ordering and performing treatments and interventions, ordering and review of laboratory studies, ordering and review of radiographic studies, pulse oximetry and re-evaluation of patient's condition.  Medications Ordered in ED Medications   stroke: mapping our early stages of recovery book (has no administration in time range)  acetaminophen (TYLENOL) tablet 650 mg (has no administration in time range)    Or  acetaminophen (TYLENOL) 160 MG/5ML solution 650 mg (has no administration in time range)    Or  acetaminophen (TYLENOL) suppository 650 mg (has no administration in time range)  senna-docusate (Senokot-S) tablet 1 tablet (has no administration in time range)  pantoprazole (PROTONIX) injection 40 mg (has no administration in time range)  sodium chloride (hypertonic) 3 % solution (has no administration in time range)    ED Course  I have reviewed the triage vital signs and the nursing notes.  Pertinent labs & imaging results that were available during my care of the patient were reviewed by me and considered in my medical decision making (see chart for details).  MDM Rules/Calculators/A&P Stroke with dense right hemiparesis and severe hypertension worrisome for recurrent  intracranial hemorrhage.  She was taken emergently to CT scan which confirmed a deep intracranial hemorrhage on the left with midline shift.  She was given a loading dose of levetiracetam and is started on blood pressure control with labetalol and clevidipine.  She was intubated for airway control.  Intubation was done by emergency medicine resident Roosevelt Locks, MD. intubation was done under my direct supervision and I was present for the entire procedure.  She will be admitted under the neurology service.  Case was seenin conjunction with Dr. Theda Sers of neurology service who will admit the patient.  Of note, initial labs show severe hypokalemia.  Will check magnesium level.  She is given potassium replacement to both down her orogastric tube and intravenously.  Magnesium has come back slightly low and she is given intravenous magnesium.  ECG shows no acute changes.  Chest x-ray shows satisfactory position of endotracheal tube and orogastric tube, no acute cardiovascular or respiratory changes.  Final Clinical Impression(s) / ED Diagnoses Final diagnoses:  Hemorrhagic stroke [facility])  Hypertensive urgency  Hypokalemia  Hypomagnesemia    Rx / DC Orders ED Discharge Orders    None       Delora Fuel, MD 35/36/14 [id number]    Delora Fuel, MD 40/08/67 [phone]

## 2020-02-23 ENCOUNTER — Inpatient Hospital Stay (HOSPITAL_COMMUNITY): Payer: No Typology Code available for payment source

## 2020-02-23 ENCOUNTER — Encounter (HOSPITAL_COMMUNITY): Payer: Self-pay | Admitting: Neurology

## 2020-02-23 ENCOUNTER — Other Ambulatory Visit: Payer: Self-pay

## 2020-02-23 DIAGNOSIS — I619 Nontraumatic intracerebral hemorrhage, unspecified: Secondary | ICD-10-CM | POA: Diagnosis present

## 2020-02-23 DIAGNOSIS — Z66 Do not resuscitate: Secondary | ICD-10-CM | POA: Diagnosis not present

## 2020-02-23 DIAGNOSIS — I610 Nontraumatic intracerebral hemorrhage in hemisphere, subcortical: Secondary | ICD-10-CM | POA: Diagnosis present

## 2020-02-23 DIAGNOSIS — Z515 Encounter for palliative care: Secondary | ICD-10-CM | POA: Diagnosis not present

## 2020-02-23 DIAGNOSIS — R4020 Unspecified coma: Secondary | ICD-10-CM | POA: Diagnosis present

## 2020-02-23 DIAGNOSIS — I959 Hypotension, unspecified: Secondary | ICD-10-CM | POA: Diagnosis not present

## 2020-02-23 DIAGNOSIS — I614 Nontraumatic intracerebral hemorrhage in cerebellum: Secondary | ICD-10-CM | POA: Diagnosis present

## 2020-02-23 DIAGNOSIS — G936 Cerebral edema: Secondary | ICD-10-CM | POA: Diagnosis present

## 2020-02-23 DIAGNOSIS — G908 Other disorders of autonomic nervous system: Secondary | ICD-10-CM | POA: Diagnosis present

## 2020-02-23 DIAGNOSIS — R4182 Altered mental status, unspecified: Secondary | ICD-10-CM | POA: Diagnosis not present

## 2020-02-23 DIAGNOSIS — R34 Anuria and oliguria: Secondary | ICD-10-CM | POA: Diagnosis not present

## 2020-02-23 DIAGNOSIS — E876 Hypokalemia: Secondary | ICD-10-CM | POA: Diagnosis present

## 2020-02-23 DIAGNOSIS — Z79899 Other long term (current) drug therapy: Secondary | ICD-10-CM | POA: Diagnosis not present

## 2020-02-23 DIAGNOSIS — E1122 Type 2 diabetes mellitus with diabetic chronic kidney disease: Secondary | ICD-10-CM | POA: Diagnosis present

## 2020-02-23 DIAGNOSIS — I129 Hypertensive chronic kidney disease with stage 1 through stage 4 chronic kidney disease, or unspecified chronic kidney disease: Secondary | ICD-10-CM | POA: Diagnosis present

## 2020-02-23 DIAGNOSIS — R29722 NIHSS score 22: Secondary | ICD-10-CM | POA: Diagnosis present

## 2020-02-23 DIAGNOSIS — E1165 Type 2 diabetes mellitus with hyperglycemia: Secondary | ICD-10-CM | POA: Diagnosis present

## 2020-02-23 DIAGNOSIS — Z794 Long term (current) use of insulin: Secondary | ICD-10-CM | POA: Diagnosis not present

## 2020-02-23 DIAGNOSIS — I160 Hypertensive urgency: Secondary | ICD-10-CM | POA: Diagnosis not present

## 2020-02-23 DIAGNOSIS — N1831 Chronic kidney disease, stage 3a: Secondary | ICD-10-CM | POA: Diagnosis present

## 2020-02-23 DIAGNOSIS — Z20822 Contact with and (suspected) exposure to covid-19: Secondary | ICD-10-CM | POA: Diagnosis present

## 2020-02-23 DIAGNOSIS — J9600 Acute respiratory failure, unspecified whether with hypoxia or hypercapnia: Secondary | ICD-10-CM | POA: Diagnosis present

## 2020-02-23 DIAGNOSIS — I615 Nontraumatic intracerebral hemorrhage, intraventricular: Secondary | ICD-10-CM | POA: Diagnosis present

## 2020-02-23 DIAGNOSIS — I161 Hypertensive emergency: Secondary | ICD-10-CM | POA: Diagnosis present

## 2020-02-23 DIAGNOSIS — J9601 Acute respiratory failure with hypoxia: Secondary | ICD-10-CM | POA: Diagnosis not present

## 2020-02-23 DIAGNOSIS — M7989 Other specified soft tissue disorders: Secondary | ICD-10-CM | POA: Diagnosis not present

## 2020-02-23 DIAGNOSIS — E785 Hyperlipidemia, unspecified: Secondary | ICD-10-CM | POA: Diagnosis present

## 2020-02-23 DIAGNOSIS — J988 Other specified respiratory disorders: Secondary | ICD-10-CM | POA: Diagnosis not present

## 2020-02-23 DIAGNOSIS — G935 Compression of brain: Secondary | ICD-10-CM | POA: Diagnosis present

## 2020-02-23 LAB — URINALYSIS, ROUTINE W REFLEX MICROSCOPIC
Bilirubin Urine: NEGATIVE
Glucose, UA: >=500 mg/dL — AB
Hgb urine dipstick: NEGATIVE
Ketones, ur: NEGATIVE mg/dL
Leukocytes,Ua: NEGATIVE
Nitrite: NEGATIVE
Protein, ur: 30 mg/dL — AB
Specific Gravity, Urine: 1.007 (ref 1.005–1.030)
pH: 9 — ABNORMAL HIGH (ref 5.0–8.0)

## 2020-02-23 LAB — I-STAT ARTERIAL BLOOD GAS, ED
Acid-Base Excess: 7 mmol/L — ABNORMAL HIGH (ref 0.0–2.0)
Bicarbonate: 31.8 mmol/L — ABNORMAL HIGH (ref 20.0–28.0)
Calcium, Ion: 1.19 mmol/L (ref 1.15–1.40)
HCT: 44 % (ref 36.0–46.0)
Hemoglobin: 15 g/dL (ref 12.0–15.0)
O2 Saturation: 100 %
Patient temperature: 98.6
Potassium: 2.8 mmol/L — ABNORMAL LOW (ref 3.5–5.1)
Sodium: 140 mmol/L (ref 135–145)
TCO2: 33 mmol/L — ABNORMAL HIGH (ref 22–32)
pCO2 arterial: 43.6 mmHg (ref 32.0–48.0)
pH, Arterial: 7.471 — ABNORMAL HIGH (ref 7.350–7.450)
pO2, Arterial: 204 mmHg — ABNORMAL HIGH (ref 83.0–108.0)

## 2020-02-23 LAB — TYPE AND SCREEN
ABO/RH(D): O POS
Antibody Screen: NEGATIVE

## 2020-02-23 LAB — MAGNESIUM: Magnesium: 1.9 mg/dL (ref 1.7–2.4)

## 2020-02-23 LAB — HIV ANTIBODY (ROUTINE TESTING W REFLEX): HIV Screen 4th Generation wRfx: NONREACTIVE

## 2020-02-23 LAB — RAPID URINE DRUG SCREEN, HOSP PERFORMED
Amphetamines: NOT DETECTED
Barbiturates: NOT DETECTED
Benzodiazepines: NOT DETECTED
Cocaine: NOT DETECTED
Opiates: NOT DETECTED
Tetrahydrocannabinol: NOT DETECTED

## 2020-02-23 LAB — ABO/RH: ABO/RH(D): O POS

## 2020-02-23 LAB — GLUCOSE, CAPILLARY
Glucose-Capillary: 356 mg/dL — ABNORMAL HIGH (ref 70–99)
Glucose-Capillary: 281 mg/dL — ABNORMAL HIGH (ref 70–99)
Glucose-Capillary: 267 mg/dL — ABNORMAL HIGH (ref 70–99)
Glucose-Capillary: 271 mg/dL — ABNORMAL HIGH (ref 70–99)
Glucose-Capillary: 245 mg/dL — ABNORMAL HIGH (ref 70–99)
Glucose-Capillary: 273 mg/dL — ABNORMAL HIGH (ref 70–99)

## 2020-02-23 LAB — I-STAT CHEM 8, ED
BUN: 21 mg/dL — ABNORMAL HIGH (ref 6–20)
Calcium, Ion: 1.11 mmol/L — ABNORMAL LOW (ref 1.15–1.40)
Chloride: 100 mmol/L (ref 98–111)
Creatinine, Ser: 1.1 mg/dL — ABNORMAL HIGH (ref 0.44–1.00)
Glucose, Bld: 292 mg/dL — ABNORMAL HIGH (ref 70–99)
HCT: 45 % (ref 36.0–46.0)
Hemoglobin: 15.3 g/dL — ABNORMAL HIGH (ref 12.0–15.0)
Potassium: 2.7 mmol/L — CL (ref 3.5–5.1)
Sodium: 138 mmol/L (ref 135–145)
TCO2: 26 mmol/L (ref 22–32)

## 2020-02-23 LAB — SODIUM
Sodium: 144 mmol/L (ref 135–145)
Sodium: 148 mmol/L — ABNORMAL HIGH (ref 135–145)
Sodium: 151 mmol/L — ABNORMAL HIGH (ref 135–145)

## 2020-02-23 LAB — RESPIRATORY PANEL BY RT PCR (FLU A&B, COVID)
Influenza A by PCR: NEGATIVE
Influenza B by PCR: NEGATIVE
SARS Coronavirus 2 by RT PCR: NEGATIVE

## 2020-02-23 LAB — TRIGLYCERIDES: Triglycerides: 91 mg/dL (ref <150)

## 2020-02-23 LAB — OSMOLALITY: Osmolality: 320 mOsm/kg — ABNORMAL HIGH (ref 275–295)

## 2020-02-23 LAB — MRSA PCR SCREENING: MRSA by PCR: NEGATIVE

## 2020-02-23 MED ORDER — POTASSIUM CHLORIDE 10 MEQ/100ML IV SOLN
10.0000 meq | INTRAVENOUS | Status: AC
  Administered 2020-02-23 (×4): 10 meq via INTRAVENOUS
  Filled 2020-02-23 (×4): qty 100

## 2020-02-23 MED ORDER — SENNOSIDES-DOCUSATE SODIUM 8.6-50 MG PO TABS: 1.0000 | ORAL_TABLET | Freq: Two times a day (BID) | ORAL | Status: DC

## 2020-02-23 MED ORDER — ETOMIDATE 2 MG/ML IV SOLN
20.0000 mg | Freq: Once | INTRAVENOUS | Status: AC
  Administered 2020-02-22: 20 mg via INTRAVENOUS

## 2020-02-23 MED ORDER — ACETAMINOPHEN 650 MG RE SUPP: 650.0000 mg | RECTAL | Status: DC | PRN

## 2020-02-23 MED ORDER — SODIUM CHLORIDE 3 % IV SOLN
INTRAVENOUS | Status: DC
  Filled 2020-02-23 (×8): qty 500

## 2020-02-23 MED ORDER — PROPOFOL 1000 MG/100ML IV EMUL
5.0000 ug/kg/min | INTRAVENOUS | Status: DC
  Administered 2020-02-22: 25 ug/kg/min via INTRAVENOUS
  Administered 2020-02-23: 10 ug/kg/min via INTRAVENOUS
  Filled 2020-02-23: qty 100

## 2020-02-23 MED ORDER — ACETAMINOPHEN 325 MG PO TABS: 650.0000 mg | ORAL_TABLET | ORAL | Status: DC | PRN

## 2020-02-23 MED ORDER — ORAL CARE MOUTH RINSE
15.0000 mL | OROMUCOSAL | Status: DC
  Administered 2020-02-23 – 2020-02-27 (×45): 15 mL via OROMUCOSAL

## 2020-02-23 MED ORDER — INSULIN ASPART 100 UNIT/ML ~~LOC~~ SOLN: 0.0000 [IU] | Freq: Three times a day (TID) | SUBCUTANEOUS | Status: DC

## 2020-02-23 MED ORDER — MAGNESIUM SULFATE 2 GM/50ML IV SOLN
INTRAVENOUS | Status: AC
  Filled 2020-02-23: qty 50

## 2020-02-23 MED ORDER — CHLORHEXIDINE GLUCONATE CLOTH 2 % EX PADS
6.0000 | MEDICATED_PAD | Freq: Every day | CUTANEOUS | Status: DC
  Administered 2020-02-23 – 2020-02-26 (×4): 6 via TOPICAL

## 2020-02-23 MED ORDER — PANTOPRAZOLE SODIUM 40 MG IV SOLR
40.0000 mg | Freq: Every day | INTRAVENOUS | Status: DC
  Administered 2020-02-23 – 2020-02-26 (×5): 40 mg via INTRAVENOUS
  Filled 2020-02-23 (×5): qty 40

## 2020-02-23 MED ORDER — INSULIN ASPART 100 UNIT/ML ~~LOC~~ SOLN
0.0000 [IU] | SUBCUTANEOUS | Status: DC
  Administered 2020-02-23: 8 [IU] via SUBCUTANEOUS
  Administered 2020-02-23: 5 [IU] via SUBCUTANEOUS
  Administered 2020-02-23: 15 [IU] via SUBCUTANEOUS
  Administered 2020-02-23: 5 [IU] via SUBCUTANEOUS
  Administered 2020-02-23: 8 [IU] via SUBCUTANEOUS
  Administered 2020-02-24 (×4): 3 [IU] via SUBCUTANEOUS
  Administered 2020-02-24 (×2): 5 [IU] via SUBCUTANEOUS
  Administered 2020-02-24 – 2020-02-25 (×2): 8 [IU] via SUBCUTANEOUS
  Administered 2020-02-25 (×2): 3 [IU] via SUBCUTANEOUS
  Administered 2020-02-25 (×2): 5 [IU] via SUBCUTANEOUS
  Administered 2020-02-26: 3 [IU] via SUBCUTANEOUS
  Administered 2020-02-26 (×4): 2 [IU] via SUBCUTANEOUS
  Administered 2020-02-26 – 2020-02-27 (×4): 3 [IU] via SUBCUTANEOUS

## 2020-02-23 MED ORDER — MAGNESIUM SULFATE 2 GM/50ML IV SOLN
2.0000 g | Freq: Once | INTRAVENOUS | Status: AC
  Administered 2020-02-23: 2 g via INTRAVENOUS
  Filled 2020-02-23: qty 50

## 2020-02-23 MED ORDER — METOPROLOL TARTRATE 5 MG/5ML IV SOLN
INTRAVENOUS | Status: AC
  Administered 2020-02-23: 5 mg
  Filled 2020-02-23: qty 5

## 2020-02-23 MED ORDER — CLEVIDIPINE BUTYRATE 0.5 MG/ML IV EMUL
0.0000 mg/h | INTRAVENOUS | Status: DC
  Administered 2020-02-22: 4 mg/h via INTRAVENOUS
  Administered 2020-02-23: 7 mg/h via INTRAVENOUS
  Administered 2020-02-23 (×2): 18 mg/h via INTRAVENOUS
  Administered 2020-02-23: 17 mg/h via INTRAVENOUS
  Administered 2020-02-23: 18 mg/h via INTRAVENOUS
  Administered 2020-02-23: 5 mg/h via INTRAVENOUS
  Administered 2020-02-23: 17 mg/h via INTRAVENOUS
  Administered 2020-02-24: 10 mg/h via INTRAVENOUS
  Administered 2020-02-24: 14 mg/h via INTRAVENOUS
  Administered 2020-02-24 (×2): 21 mg/h via INTRAVENOUS
  Administered 2020-02-24: 16 mg/h via INTRAVENOUS
  Administered 2020-02-24: 13 mg/h via INTRAVENOUS
  Administered 2020-02-24: 21 mg/h via INTRAVENOUS
  Administered 2020-02-25: 18 mg/h via INTRAVENOUS
  Administered 2020-02-25: 16 mg/h via INTRAVENOUS
  Administered 2020-02-25: 18 mg/h via INTRAVENOUS
  Administered 2020-02-26: 2 mg/h via INTRAVENOUS
  Administered 2020-02-26: 4 mg/h via INTRAVENOUS
  Filled 2020-02-23 (×2): qty 50
  Filled 2020-02-23: qty 100
  Filled 2020-02-23 (×2): qty 50
  Filled 2020-02-23 (×2): qty 100
  Filled 2020-02-23 (×6): qty 50
  Filled 2020-02-23: qty 100
  Filled 2020-02-23 (×5): qty 50

## 2020-02-23 MED ORDER — STROKE: EARLY STAGES OF RECOVERY BOOK
Freq: Once | Status: AC
  Filled 2020-02-23: qty 1

## 2020-02-23 MED ORDER — POTASSIUM CHLORIDE 20 MEQ/15ML (10%) PO SOLN
40.0000 meq | Freq: Once | ORAL | Status: AC
  Administered 2020-02-23: 40 meq
  Filled 2020-02-23: qty 30

## 2020-02-23 MED ORDER — CHLORHEXIDINE GLUCONATE 0.12% ORAL RINSE (MEDLINE KIT)
15.0000 mL | Freq: Two times a day (BID) | OROMUCOSAL | Status: DC
  Administered 2020-02-23 – 2020-02-27 (×10): 15 mL via OROMUCOSAL

## 2020-02-23 MED ORDER — INSULIN DETEMIR 100 UNIT/ML ~~LOC~~ SOLN
15.0000 [IU] | Freq: Every day | SUBCUTANEOUS | Status: DC
  Administered 2020-02-23 – 2020-02-24 (×2): 15 [IU] via SUBCUTANEOUS
  Filled 2020-02-23 (×4): qty 0.15

## 2020-02-23 MED ORDER — SENNOSIDES-DOCUSATE SODIUM 8.6-50 MG PO TABS
1.0000 | ORAL_TABLET | Freq: Two times a day (BID) | ORAL | Status: DC
  Administered 2020-02-24 – 2020-02-27 (×6): 1
  Filled 2020-02-23 (×6): qty 1

## 2020-02-23 MED ORDER — ACETAMINOPHEN 160 MG/5ML PO SOLN
650.0000 mg | ORAL | Status: DC | PRN
  Administered 2020-02-23 – 2020-02-26 (×14): 650 mg
  Filled 2020-02-23 (×14): qty 20.3

## 2020-02-23 MED ORDER — ROCURONIUM BROMIDE 50 MG/5ML IV SOLN
20.0000 mg | Freq: Once | INTRAVENOUS | Status: AC
  Administered 2020-02-22: 20 mg via INTRAVENOUS

## 2020-02-23 NOTE — Progress Notes (Addendum)
OT Cancellation Note  Patient Details Name: Iyania Denne MRN: [id number] DOB: 07-03-1965   Cancelled Treatment:    Reason Eval/Treat Not Completed: Medical issues which prohibited therapy;Active bedrest order; pt intubated and with bedrest orders. Spoke with RN who reports planning for family meeting to discuss POC. Will follow.  Marcy Siren, OT Acute Rehabilitation Services Pager [phone] Office [phone]   Orlando Penner 02/23/2020, 10:26 AM

## 2020-02-23 NOTE — Progress Notes (Signed)
NAME:  Latoya Cox, MRN:  [id number], DOB:  [birth year], LOS: 0 ADMISSION DATE:  01/30/2020, CONSULTATION DATE:  02/23/2020 REFERRING MD: Dr. Theda Sers, CHIEF COMPLAINT: Unresponsiveness  Brief History   Patient with a large left midline shift secondary to basal ganglia hemorrhage  History of present illness   Patient is a 54 year old with history of hypertension diabetes mellitus and a recent intracranial hemorrhage who acutely began to have difficulty talking about 2200 yesterday evening.  On arrival of EMS patient was nonverbal without right-sided movement in right gaze.  On arrival to the emergency room the patient was intubated after it was found she has rightward midline shift.  She is not felt to be a neurosurgical candidate. On my evaluation the patient is on propofol and Cleviprex.  Post intubation pH 7.4 PCO2 43 PO2 of 204.  Potassium is 2.8, receiving replacement.  Hemoglobin is 15 platelet count is 183.  ET PTT are normal.  Glucose was 294.  Chest x-ray clear Patient was discharged in August after a prolonged rehabilitation stent due to thalamic stroke on 10/15/2019. Past Medical History   Past Medical History:  Diagnosis Date  . Arthritis   . Chest wall pain   . Diabetes mellitus without complication ([place])   . Hypertension     Significant Hospital Events     Consults:  PCCM Neurosurgery  Procedures:  None  Significant Diagnostic Tests:  CT head 10/26-0547 IMPRESSION: 1. Interval increase in size of left basal ganglia hemorrhage now measuring 5.8 x 3.7 x 5.5 cm. 2. Increasing surrounding vasogenic edema. 3. Increasing mass effect with midline shift to 10 mm. 4. Progressive intraventricular hemorrhage and developing hydrocephalus with enlarging right lateral ventricle. 5. Stable aneurysm clip at the level of the left PICA. 6. Stable left frontal craniotomy. Micro Data:  MRSA by PCR negative SARS negative Influenza negative Antimicrobials:  None  Interim  history/subjective:  Off sedation No purposeful movements  Objective   Blood pressure 129/75, pulse (!) 115, temperature (!) 100.4 F (38 C), resp. rate (!) 21, height 5' 2"  (1.575 m), weight 75.6 kg, SpO2 100 %.    Vent Mode: PSV;CPAP FiO2 (%):  [40 %-100 %] 40 % Set Rate:  [20 bmp] 20 bmp Vt Set:  [400 mL] 400 mL PEEP:  [5 cmH20] 5 cmH20 Pressure Support:  [8 cmH20] 8 cmH20 Plateau Pressure:  [13 cmH20-14 cmH20] 14 cmH20   Intake/Output Summary (Last 24 hours) at 02/23/2020 7209 Last data filed at 02/23/2020 0700 Gross per 24 hour  Intake 1171.32 ml  Output 900 ml  Net 271.32 ml   Filed Weights   02/23/20 0120  Weight: 75.6 kg    Examination: General: Unresponsive, off sedation HENT: Endotracheal tube in place Lungs: Clear breath sounds bilaterally Cardiovascular: S1-S2 appreciated Abdomen: Soft, bowel sounds appreciated Extremities: No edema, no clubbing Neuro: Unresponsive GU:   Most recent arterial blood gas 7.47/44/204   Resolved Hospital Problem list     Assessment & Plan:  .  S/p large left basal ganglia hemorrhage -Prognosis is grim -Seen by neurosurgery, catastrophic bleed.  No surgical intervention will be of benefit -Blood pressure management, on Cleviprex  .  Respiratory failure -Continue vent management -VAP bundle in place -Weaning per protocol  .  Hyperglycemia -SSI  .  Prognosis is very poor  Best practice:  Diet: N.p.o. Pain/Anxiety/Delirium protocol (if indicated): Sedation on hold VAP protocol (if indicated): In place DVT prophylaxis:  GI prophylaxis: Protonix Glucose control: SSI Mobility: Bedrest Code Status: Full code  Family Communication: Per primary Disposition: Intensive care unit  Labs   CBC: Recent Labs  Lab 02/12/2020 2313 02/05/2020 2318 02/23/2020 2323 02/23/20 0030  WBC 8.5  --   --   --   NEUTROABS 3.8  --   --   --   HGB 14.2 15.3* 15.0 15.0  HCT 45.0 45.0 44.0 44.0  MCV 86.5  --   --   --   PLT 183  --    --   --     Basic Metabolic Panel: Recent Labs  Lab 01/31/2020 2313 02/20/2020 2318 02/04/2020 2323 02/23/20 0030  NA 138 138 138 140  K 2.8* 2.7* 2.9* 2.8*  CL 99 100 100  --   CO2 24  --   --   --   GLUCOSE 290* 292* 294*  --   BUN 19 21* 25*  --   CREATININE 1.19* 1.10* 1.10*  --   CALCIUM 9.6  --   --   --    GFR: Estimated Creatinine Clearance: 57 mL/min (A) (by C-G formula based on SCr of 1.1 mg/dL (H)). Recent Labs  Lab 02/15/2020 2313  WBC 8.5    Liver Function Tests: Recent Labs  Lab 02/09/2020 2313  AST 20  ALT 14  ALKPHOS 67  BILITOT 1.2  PROT 7.4  ALBUMIN 4.4   No results for input(s): LIPASE, AMYLASE in the last 168 hours. No results for input(s): AMMONIA in the last 168 hours.  ABG    Component Value Date/Time   PHART 7.471 (H) 02/23/2020 0030   PCO2ART 43.6 02/23/2020 0030   PO2ART 204 (H) 02/23/2020 0030   HCO3 31.8 (H) 02/23/2020 0030   TCO2 33 (H) 02/23/2020 0030   O2SAT 100.0 02/23/2020 0030     Coagulation Profile: Recent Labs  Lab 02/20/2020 2313  INR 1.0    Cardiac Enzymes: No results for input(s): CKTOTAL, CKMB, CKMBINDEX, TROPONINI in the last 168 hours.  HbA1C: Hgb A1c MFr Bld  Date/Time Value Ref Range Status  01/01/2020 12:31 PM 8.0 (H) <5.7 % of total Hgb Final    Comment:    For someone without known diabetes, a hemoglobin A1c value of 6.5% or greater indicates that they may have  diabetes and this should be confirmed with a follow-up  test. . For someone with known diabetes, a value <7% indicates  that their diabetes is well controlled and a value  greater than or equal to 7% indicates suboptimal  control. A1c targets should be individualized based on  duration of diabetes, age, comorbid conditions, and  other considerations. . Currently, no consensus exists regarding use of hemoglobin A1c for diagnosis of diabetes for children. Marland Kitchen   10/15/2019 07:49 PM 9.1 (H) 4.8 - 5.6 % Final    Comment:    (NOTE)          Prediabetes: 5.7 - 6.4         Diabetes: >6.4         Glycemic control for adults with diabetes: <7.0     CBG: Recent Labs  Lab 02/19/2020 2310 02/23/20 0330 02/23/20 0800  GLUCAP 282* 356* 281*    Review of Systems:   Unobtainable  Past Medical History  She,  has a past medical history of Arthritis, Chest wall pain, Diabetes mellitus without complication ([place]), and Hypertension.   Surgical History    Past Surgical History:  Procedure Laterality Date  . aneurism repair    . lapband  Social History   reports that she has never smoked. She has never used smokeless tobacco. She reports that she does not drink alcohol and does not use drugs.   Family History   Her family history includes Arthritis in her father and mother; Diabetes in her father and mother; Hypertension in her father and mother; Stroke in her mother.   Allergies Allergies  Allergen Reactions  . Hydrocodone Itching    Pt states "if i take 2, it makes me itch"     Home Medications  Prior to Admission medications   Medication Sig Start Date End Date Taking? Authorizing Provider  Ascorbic Acid (VITAMIN C) 100 MG tablet Take 100 mg by mouth daily.   Yes [provider]  atorvastatin (LIPITOR) 40 MG tablet Take 1 tablet (40 mg total) by mouth daily. 02/08/20  Yes Hoyt Koch, MD  carvedilol (COREG) 25 MG tablet Take 1 tablet (25 mg total) by mouth 2 (two) times daily with a meal. 02/08/20  Yes Hoyt Koch, MD  [employer] STOOL SOFTENER 100 MG capsule Take 1 capsule (100 mg total) by mouth daily. 01/27/20  Yes Hoyt Koch, MD  [employer] VITAMIN C 250 MG tablet Take 1 tablet (250 mg total) by mouth daily. Take 250 mg by mouth daily. 01/27/20  Yes Hoyt Koch, MD  fluticasone [facility]) 50 MCG/ACT nasal spray Place 1 spray daily as needed into both nostrils for allergies. 03/04/17  Yes Hoyt Koch, MD  hydrochlorothiazide (MICROZIDE) 12.5 MG capsule Take 1 capsule  (12.5 mg total) by mouth daily. 02/08/20  Yes Hoyt Koch, MD  insulin aspart (NOVOLOG) 100 UNIT/ML injection Sliding scale  CBG 70 - 120: 0 units  CBG 121 - 150: 1 unit,   CBG 151 - 200: 2 units,   CBG 201 - 250: 3 units,   CBG 251 - 300: 5 units,   CBG 301 - 350: 7 units,   CBG 351 - 400: 9 units    CBG > 400: 9 units and notify your MD 01/01/20  Yes Marrian Salvage, FNP  insulin detemir (LEVEMIR) 100 UNIT/ML injection Inject 0.13 mLs (13 Units total) into the skin 2 (two) times daily. 01/01/20  Yes Marrian Salvage, FNP  lisinopril (ZESTRIL) 10 MG tablet Take 1 tablet (10 mg total) by mouth daily. 02/08/20  Yes Hoyt Koch, MD  Multiple Vitamins-Minerals (MULTIVITAMIN ADULT) TABS Take 1 tablet by mouth daily.   Yes [provider]  nystatin-triamcinolone ointment (MYCOLOG) APPLY TO AFFECTED AREA TWICE DAILY Patient taking differently: Apply 1 application topically 2 (two) times daily.  01/29/17  Yes Hoyt Koch, MD  omeprazole (PRILOSEC) 40 MG capsule TAKE 1 CAPSULE BY MOUTH EVERY DAY Patient taking differently: Take 40 mg by mouth daily as needed (Acid Reflux).  01/27/20  Yes Hoyt Koch, MD  potassium chloride SA (KLOR-CON M20) 20 MEQ tablet Take 1 tablet (20 mEq total) by mouth daily. 02/08/20  Yes Hoyt Koch, MD  zolpidem (AMBIEN) 5 MG tablet Take 1 tablet (5 mg total) by mouth at bedtime as needed. 01/27/20  Yes Binnie Rail, MD  Benzoyl Peroxide 10 % CREA Use on face twice daily Patient not taking: Reported on 02/23/2020 10/05/19   Hoyt Koch, MD  blood glucose meter kit and supplies KIT Dispense based on patient and insurance preference. Use up to four times daily as directed. (FOR ICD-9 250.00, 250.01). 01/01/20   Marrian Salvage, [place]  LANCETS MISC Use daily to check sugars. 08/30/15   Hoyt Koch, MD  nystatin-triamcinolone [facility] II) cream Sparingly Topical Daily Patient not  taking: Reported on 02/23/2020 01/27/20   Hoyt Koch, MD    The patient is critically ill with multiple organ systems failure and requires high complexity decision making for assessment and support, frequent evaluation and titration of therapies, application of advanced monitoring technologies and extensive interpretation of multiple databases. Critical Care Time devoted to patient care services described in this note independent of APP/resident time (if applicable)  is 30 minutes.   Sherrilyn Rist MD [place] [facility] Personal pager: [phone] If unanswered, please page CCM On-call: [phone]

## 2020-02-23 NOTE — Progress Notes (Signed)
Discussed patient's status of intracranial hemorrhage with brain injury and poor prognosis with her husband, Sharren Schnurr, at bedside today and via telephone with Dr. Pearlean Brownie as well. He understands that she will not have any meaningful recovery and is not a surgical candidate. The patient and husband have discussed in the event of this scenario the patient would not want prolonged life support without neurological recovery. All questions answered. He would like to have family come by tomorrow to see her. Discussed code status, and he would like to transition her to DNR with continuation of all other treatment.   Versie Starks, DO 02/23/2020, 4:54 PM Pager: [phone]

## 2020-02-23 NOTE — Progress Notes (Signed)
Pharmacy Electrolyte Replacement  Recent Labs:  Recent Labs    02/03/2020 2323 02/13/2020 2323 02/23/20 0030 02/23/20 0816  K 2.9*   < > 2.8*  --   MG  --   --   --  1.9  CREATININE 1.10*  --   --   --    < > = values in this interval not displayed.    Low Critical Values (K </= 2.5, Phos </= 1, Mg </= 1) Present: None  Plan:  - Mg 1.9 - give Mg 2g IV x 1 - K 2.8 - total of 80 mEq replacement already given - Recheck Mg with AM labs  Thank you for allowing pharmacy to be a part of this patient's care.  Georgina Pillion, PharmD, BCPS Clinical Pharmacist Clinical phone for 02/23/2020: [phone] 02/23/2020 10:02 AM   **Pharmacist phone directory can now be found on [email or web address] ([email or web address]).  Listed under [facility].

## 2020-02-23 NOTE — Progress Notes (Signed)
PT Cancellation Note  Patient Details Name: Khamani Daniely MRN: [id number] DOB: 08/26/1965   Cancelled Treatment:    Reason Eval/Treat Not Completed: Patient not medically ready.  Intubated, active bedrest orders. 02/23/2020  Jacinto Halim., PT Acute Rehabilitation Services [phone]  (pager) [phone]  (office)   Eliseo Gum Tali Coster 02/23/2020, 1:44 PM

## 2020-02-23 NOTE — Progress Notes (Signed)
    Providing Compassionate, Quality Care - Together   BP (!) 214/121   Pulse 97   Resp 18   Ht 5\' 2"  (1.575 m)   SpO2 97%   BMI 28.97 kg/m    DG Chest Portable 1 View  Result Date: 02/10/2020 CLINICAL DATA:  Intubation EXAM: PORTABLE CHEST 1 VIEW COMPARISON:  None. FINDINGS: The heart size and mediastinal contours are within normal limits. ETT is 2.8 cm above the level of the carina. NG tube is seen within the proximal stomach. No large airspace consolidation or pleural effusion. There is shallow degree of aeration with bibasilar subsegmental atelectasis. IMPRESSION: ETT and NG tube in satisfactory position. Bibasilar subsegmental atelectasis Electronically Signed   By: 02/24/2020 M.D.   On: 02/06/2020 23:59   CT HEAD CODE STROKE WO CONTRAST  Result Date: 02/09/2020 CLINICAL DATA:  Code stroke.  Right-sided weakness EXAM: CT HEAD WITHOUT CONTRAST TECHNIQUE: Contiguous axial images were obtained from the base of the skull through the vertex without intravenous contrast. COMPARISON:  None. FINDINGS: Brain: Intraparenchymal hematoma centered at the left basal ganglia measures 4.6 x 4.0 x 3.4 cm (volume = 33 cm^3). 4 mm of rightward midline shift. There is a large amount of intraventricular extension into the lateral and third ventricles. Early entrapment of the right temporal horn. Vascular: No abnormal hyperdensity of the major intracranial arteries or dural venous sinuses. No intracranial atherosclerosis. Skull: The visualized skull base, calvarium and extracranial soft tissues are normal. Sinuses/Orbits: No fluid levels or advanced mucosal thickening of the visualized paranasal sinuses. No mastoid or middle ear effusion. The orbits are normal. IMPRESSION: 1. Intraparenchymal hematoma centered at the left basal ganglia with intraventricular extension and 4 mm of rightward midline shift. 2. Early entrapment of the right temporal horn. Critical Value/emergent results were called by telephone at  the time of interpretation on 02/01/2020 at 11:34 pm to provider [facility], who verbally acknowledged these results. Electronically Signed   By: [facility] M.D.   On: 02/14/2020 23:35     Neurosurgery contacted by ED provider, Dr. 02/24/2020, due to large left-sided intraparenchymal hematoma. CT scan reviewed by Dr. Preston Fleeting and myself. No Neurosurgical intervention recommended given the size and the location of the ICH in the dominant hemisphere. Unfortunately, the patient has suffered a devastating stroke, with minimal chance of meaningful recovery.  Jordan Likes, DNP, AGNP-C Nurse Practitioner 02/23/2020, 1:00 AM    [facility] [address], [place], [place] P: [phone]    F: [phone]

## 2020-02-23 NOTE — Progress Notes (Signed)
RT transported pt to and from CT without event. 

## 2020-02-23 NOTE — Progress Notes (Signed)
Patient transported from ED Room 15 to 4N25 with no complications.

## 2020-02-23 NOTE — Progress Notes (Signed)
[place] referral # [id number] Spoke with Derl Barrow

## 2020-02-23 NOTE — Consult Note (Signed)
Reason for Consult: Intracerebral hemorrhage Referring Physician: Stroke  Latoya Cox is an 54 y.o. female.  HPI: 54 year old female with history of prior left-sided craniotomy and aneurysm clipping by an outside institution, date unknown.  Patient presents with a large left-sided basal ganglia hypertensive hemorrhage.  Patient with minimal neurologic exam and presentation.  Follow-up scan earlier this morning demonstrates progression of hemorrhage.  Patient has been hemodynamically stable.  No history of trauma.  Past Medical History:  Diagnosis Date  . Arthritis   . Chest wall pain   . Diabetes mellitus without complication (HCC)   . Hypertension     Past Surgical History:  Procedure Laterality Date  . aneurism repair    . lapband      Family History  Problem Relation Age of Onset  . Stroke Mother   . Hypertension Mother   . Arthritis Mother   . Diabetes Mother   . Arthritis Father   . Hypertension Father   . Diabetes Father     Social History:  reports that she has never smoked. She has never used smokeless tobacco. She reports that she does not drink alcohol and does not use drugs.  Allergies:  Allergies  Allergen Reactions  . Hydrocodone Itching    Pt states "if i take 2, it makes me itch"    Medications: I have reviewed the patient's current medications.  Results for orders placed or performed during the hospital encounter of 01/31/2020 (from the past 48 hour(s))  Ethanol     Status: None   Collection Time: 02/07/2020 11:09 PM  Result Value Ref Range   Alcohol, Ethyl (B) <10 <10 mg/dL    Comment: (NOTE) Lowest detectable limit for serum alcohol is 10 mg/dL.  For medical purposes only. Performed at [facility] Lab, [address], [place], [place] [address]   Urine rapid drug screen (hosp performed)     Status: None   Collection Time: February 28, 2020 11:09 PM  Result Value Ref Range   Opiates NONE DETECTED NONE DETECTED   Cocaine NONE DETECTED NONE DETECTED    Benzodiazepines NONE DETECTED NONE DETECTED   Amphetamines NONE DETECTED NONE DETECTED   Tetrahydrocannabinol NONE DETECTED NONE DETECTED   Barbiturates NONE DETECTED NONE DETECTED    Comment: (NOTE) DRUG SCREEN FOR MEDICAL PURPOSES ONLY.  IF CONFIRMATION IS NEEDED FOR ANY PURPOSE, NOTIFY LAB WITHIN 5 DAYS.  LOWEST DETECTABLE LIMITS FOR URINE DRUG SCREEN Drug Class                     Cutoff (ng/mL) Amphetamine and metabolites    1000 Barbiturate and metabolites    200 Benzodiazepine                 200 Tricyclics and metabolites     300 Opiates and metabolites        300 Cocaine and metabolites        300 THC                            50 Performed at [facility] Lab, [address], [place], [place] [address]   Urinalysis, Routine w reflex microscopic Urine, Catheterized     Status: Abnormal   Collection Time: February 28, 2020 11:09 PM  Result Value Ref Range   Color, Urine COLORLESS (A) YELLOW   APPearance CLEAR CLEAR   Specific Gravity, Urine 1.007 1.005 - 1.030   pH 9.0 (H) 5.0 - 8.0  Glucose, UA >=500 (A) NEGATIVE mg/dL   Hgb urine dipstick NEGATIVE NEGATIVE   Bilirubin Urine NEGATIVE NEGATIVE   Ketones, ur NEGATIVE NEGATIVE mg/dL   Protein, ur 30 (A) NEGATIVE mg/dL   Nitrite NEGATIVE NEGATIVE   Leukocytes,Ua NEGATIVE NEGATIVE   RBC / HPF 0-5 0 - 5 RBC/hpf   WBC, UA 0-5 0 - 5 WBC/hpf   Bacteria, UA RARE (A) NONE SEEN    Comment: Performed at [facility] Lab, [address] [place], [place] [address]  CBG monitoring, ED     Status: Abnormal   Collection Time: 02/23/20 11:10 PM  Result Value Ref Range   Glucose-Capillary 282 (H) 70 - 99 mg/dL    Comment: Glucose reference range applies only to samples taken after fasting for at least 8 hours.  Protime-INR     Status: None   Collection Time: 23-Feb-2020 11:13 PM  Result Value Ref Range   Prothrombin Time 12.7 11.4 - 15.2 seconds   INR 1.0 0.8 - 1.2    Comment: (NOTE) INR goal varies based on device and disease  states. Performed at [facility] Lab, [address], [place], [place] [address]   APTT     Status: None   Collection Time: 23-Feb-2020 11:13 PM  Result Value Ref Range   aPTT 26 24 - 36 seconds    Comment: Performed at [facility], [address], [place], [place] [address]  CBC     Status: Abnormal   Collection Time: 02/23/2020 11:13 PM  Result Value Ref Range   WBC 8.5 4.0 - 10.5 K/uL   RBC 5.20 (H) 3.87 - 5.11 MIL/uL   Hemoglobin 14.2 12.0 - 15.0 g/dL   HCT 76.1 36 - 46 %   MCV 86.5 80.0 - 100.0 fL   MCH 27.3 26.0 - 34.0 pg   MCHC 31.6 30.0 - 36.0 g/dL   RDW 60.7 37.1 - 06.2 %   Platelets 183 150 - 400 K/uL   nRBC 0.0 0.0 - 0.2 %    Comment: Performed at [facility], [address], [place], [place] [address]  Differential     Status: None   Collection Time: 2020-02-23 11:13 PM  Result Value Ref Range   Neutrophils Relative % 45 %   Neutro Abs 3.8 1.7 - 7.7 K/uL   Lymphocytes Relative 45 %   Lymphs Abs 3.9 0.7 - 4.0 K/uL   Monocytes Relative 8 %   Monocytes Absolute 0.7 0.1 - 1.0 K/uL   Eosinophils Relative 1 %   Eosinophils Absolute 0.1 0.0 - 0.5 K/uL   Basophils Relative 1 %   Basophils Absolute 0.1 0.0 - 0.1 K/uL   Immature Granulocytes 0 %   Abs Immature Granulocytes 0.02 0.00 - 0.07 K/uL    Comment: Performed at [facility] Lab, [address] [place], [place] [address]  Comprehensive metabolic panel     Status: Abnormal   Collection Time: 2020-02-23 11:13 PM  Result Value Ref Range   Sodium 138 135 - 145 mmol/L   Potassium 2.8 (L) 3.5 - 5.1 mmol/L   Chloride 99 98 - 111 mmol/L   CO2 24 22 - 32 mmol/L   Glucose, Bld 290 (H) 70 - 99 mg/dL    Comment: Glucose reference range applies only to samples taken after fasting for at least 8 hours.   BUN 19 6 - 20 mg/dL   Creatinine, Ser 3.50 (H) 0.44 - 1.00 mg/dL   Calcium  9.6 8.9 - 10.3 mg/dL   Total Protein 7.4 6.5 - 8.1 g/dL   Albumin 4.4 3.5 - 5.0 g/dL   AST 20 15 - 41 U/L   ALT 14 0 - 44 U/L    Alkaline Phosphatase 67 38 - 126 U/L   Total Bilirubin 1.2 0.3 - 1.2 mg/dL   GFR, Estimated 55 (L) >60 mL/min    Comment: (NOTE) Calculated using the CKD-EPI Creatinine Equation (2021)    Anion gap 15 5 - 15    Comment: Performed at [facility] Lab, [address], [place], [place] [address]  I-Stat beta hCG blood, ED     Status: None   Collection Time: 01/29/2020 11:17 PM  Result Value Ref Range   I-stat hCG, quantitative <5.0 <5 mIU/mL   Comment 3            Comment:   GEST. AGE      CONC.  (mIU/mL)   <=1 WEEK        5 - 50     2 WEEKS       50 - 500     3 WEEKS       100 - 10,000     4 WEEKS     1,000 - 30,000        FEMALE AND NON-PREGNANT FEMALE:     LESS THAN 5 mIU/mL   I-stat chem 8, ed     Status: Abnormal   Collection Time: 02/03/2020 11:18 PM  Result Value Ref Range   Sodium 138 135 - 145 mmol/L   Potassium 2.7 (LL) 3.5 - 5.1 mmol/L   Chloride 100 98 - 111 mmol/L   BUN 21 (H) 6 - 20 mg/dL   Creatinine, Ser 3.35 (H) 0.44 - 1.00 mg/dL   Glucose, Bld 456 (H) 70 - 99 mg/dL    Comment: Glucose reference range applies only to samples taken after fasting for at least 8 hours.   Calcium, Ion 1.11 (L) 1.15 - 1.40 mmol/L   TCO2 26 22 - 32 mmol/L   Hemoglobin 15.3 (H) 12.0 - 15.0 g/dL   HCT 25.6 36 - 46 %   Comment NOTIFIED PHYSICIAN   I-stat chem 8, ED     Status: Abnormal   Collection Time: 02/21/2020 11:23 PM  Result Value Ref Range   Sodium 138 135 - 145 mmol/L   Potassium 2.9 (L) 3.5 - 5.1 mmol/L   Chloride 100 98 - 111 mmol/L   BUN 25 (H) 6 - 20 mg/dL   Creatinine, Ser 3.89 (H) 0.44 - 1.00 mg/dL   Glucose, Bld 373 (H) 70 - 99 mg/dL    Comment: Glucose reference range applies only to samples taken after fasting for at least 8 hours.   Calcium, Ion 1.06 (L) 1.15 - 1.40 mmol/L   TCO2 28 22 - 32 mmol/L   Hemoglobin 15.0 12.0 - 15.0 g/dL   HCT 42.8 36 - 46 %  I-Stat arterial blood gas, ED     Status: Abnormal   Collection Time: 02/23/20 12:30 AM  Result Value Ref Range    pH, Arterial 7.471 (H) 7.35 - 7.45   pCO2 arterial 43.6 32 - 48 mmHg   pO2, Arterial 204 (H) 83 - 108 mmHg   Bicarbonate 31.8 (H) 20.0 - 28.0 mmol/L   TCO2 33 (H) 22 - 32 mmol/L   O2 Saturation 100.0 %   Acid-Base Excess 7.0 (H) 0.0 - 2.0 mmol/L   Sodium 140 135 -  145 mmol/L   Potassium 2.8 (L) 3.5 - 5.1 mmol/L   Calcium, Ion 1.19 1.15 - 1.40 mmol/L   HCT 44.0 36 - 46 %   Hemoglobin 15.0 12.0 - 15.0 g/dL   Patient temperature 40.9 F    Collection site Radial    Drawn by RT    Sample type ARTERIAL   Respiratory Panel by RT PCR (Flu A&B, Covid) -     Status: None   Collection Time: 02/23/20  2:13 AM  Result Value Ref Range   SARS Coronavirus 2 by RT PCR NEGATIVE NEGATIVE    Comment: (NOTE) SARS-CoV-2 target nucleic acids are NOT DETECTED.  The SARS-CoV-2 RNA is generally detectable in upper respiratoy specimens during the acute phase of infection. The lowest concentration of SARS-CoV-2 viral copies this assay can detect is 131 copies/mL. A negative result does not preclude SARS-Cov-2 infection and should not be used as the sole basis for treatment or other patient management decisions. A negative result may occur with  improper specimen collection/handling, submission of specimen other than nasopharyngeal swab, presence of viral mutation(s) within the areas targeted by this assay, and inadequate number of viral copies (<131 copies/mL). A negative result must be combined with clinical observations, patient history, and epidemiological information. The expected result is Negative.  Fact Sheet for Patients:  https://www.moore.com/  Fact Sheet for Healthcare Providers:  https://www.young.biz/  This test is no t yet approved or cleared by the [place] FDA and  has been authorized for detection and/or diagnosis of SARS-CoV-2 by FDA under an Emergency Use Authorization (EUA). This EUA will remain  in effect (meaning this test can be  used) for the duration of the COVID-19 declaration under Section 564(b)(1) of the Act, 21 U.S.C. section 360bbb-3(b)(1), unless the authorization is terminated or revoked sooner.     Influenza A by PCR NEGATIVE NEGATIVE   Influenza B by PCR NEGATIVE NEGATIVE    Comment: (NOTE) The Xpert Xpress SARS-CoV-2/FLU/RSV assay is intended as an aid in  the diagnosis of influenza from Nasopharyngeal swab specimens and  should not be used as a sole basis for treatment. Nasal washings and  aspirates are unacceptable for Xpert Xpress SARS-CoV-2/FLU/RSV  testing.  Fact Sheet for Patients: https://www.moore.com/  Fact Sheet for Healthcare Providers: https://www.young.biz/  This test is not yet approved or cleared by the [place] FDA and  has been authorized for detection and/or diagnosis of SARS-CoV-2 by  FDA under an Emergency Use Authorization (EUA). This EUA will remain  in effect (meaning this test can be used) for the duration of the  Covid-19 declaration under Section 564(b)(1) of the Act, 21  U.S.C. section 360bbb-3(b)(1), unless the authorization is  terminated or revoked. Performed at [facility] Lab, [address] [place], [place] [address]   MRSA PCR Screening     Status: None   Collection Time: 02/23/20  2:47 AM   Specimen: Nasal Mucosa; Nasopharyngeal  Result Value Ref Range   MRSA by PCR NEGATIVE NEGATIVE    Comment:        The GeneXpert MRSA Assay (FDA approved for NASAL specimens only), is one component of a comprehensive MRSA colonization surveillance program. It is not intended to diagnose MRSA infection nor to guide or monitor treatment for MRSA infections. Performed at [facility], [address], [place], [place] [address]   Glucose, capillary     Status: Abnormal   Collection Time: 02/23/20  3:30 AM  Result Value Ref Range  Glucose-Capillary 356 (H) 70 - 99 mg/dL    Comment: Glucose reference range  applies only to samples taken after fasting for at least 8 hours.  Glucose, capillary     Status: Abnormal   Collection Time: 02/23/20  8:00 AM  Result Value Ref Range   Glucose-Capillary 281 (H) 70 - 99 mg/dL    Comment: Glucose reference range applies only to samples taken after fasting for at least 8 hours.    CT HEAD WO CONTRAST  Result Date: 02/23/2020 CLINICAL DATA:  Hemorrhagic stroke. EXAM: CT HEAD WITHOUT CONTRAST TECHNIQUE: Contiguous axial images were obtained from the base of the skull through the vertex without intravenous contrast. COMPARISON:  CT head without contrast 02/23/20 FINDINGS: Brain: Left basal ganglia hemorrhage has increased in size. Hemorrhage now measures 5.8 x 3.7 x 5.5 cm. Surrounding vasogenic edema is increasing. Intraventricular hemorrhages again noted. High-density blood products are now present within the lateral ventricles bilaterally as well as the third and fourth ventricles. Right lateral ventricle is enlarging. Hemorrhage extends into the brainstem. Midline shift to is increased from 9 mm to 10 mm. Foramen magnum is patent. No new hemorrhage is present. No cortical infarcts are present. Right basal ganglia are within normal limits. The brainstem and cerebellum are otherwise within normal limits. Vascular: Atherosclerotic calcifications are present in the cavernous internal carotid arteries bilaterally. Aneurysm clip at the level of the left PICA is stable. Skull: Left frontal craniotomy is again noted. Left frontal burr hole noted. Calvarium is otherwise within normal limits. No significant extracranial soft tissue lesion is present. Sinuses/Orbits: The paranasal sinuses and mastoid air cells are clear. The globes and orbits are within normal limits. IMPRESSION: 1. Interval increase in size of left basal ganglia hemorrhage now measuring 5.8 x 3.7 x 5.5 cm. 2. Increasing surrounding vasogenic edema. 3. Increasing mass effect with midline shift to 10 mm. 4.  Progressive intraventricular hemorrhage and developing hydrocephalus with enlarging right lateral ventricle. 5. Stable aneurysm clip at the level of the left PICA. 6. Stable left frontal craniotomy. Critical Value/emergent results were called by telephone at the time of interpretation on 02/23/2020 at 6:03 am to provider [facility] , who verbally acknowledged these results. Electronically Signed   By: Marin Roberts M.D.   On: 02/23/2020 06:03   DG Chest Portable 1 View  Result Date: 23-Feb-2020 CLINICAL DATA:  Intubation EXAM: PORTABLE CHEST 1 VIEW COMPARISON:  None. FINDINGS: The heart size and mediastinal contours are within normal limits. ETT is 2.8 cm above the level of the carina. NG tube is seen within the proximal stomach. No large airspace consolidation or pleural effusion. There is shallow degree of aeration with bibasilar subsegmental atelectasis. IMPRESSION: ETT and NG tube in satisfactory position. Bibasilar subsegmental atelectasis Electronically Signed   By: Jonna Clark M.D.   On: 23-Feb-2020 23:59   CT HEAD CODE STROKE WO CONTRAST  Result Date: 2020/02/23 CLINICAL DATA:  Code stroke.  Right-sided weakness EXAM: CT HEAD WITHOUT CONTRAST TECHNIQUE: Contiguous axial images were obtained from the base of the skull through the vertex without intravenous contrast. COMPARISON:  None. FINDINGS: Brain: Intraparenchymal hematoma centered at the left basal ganglia measures 4.6 x 4.0 x 3.4 cm (volume = 33 cm^3). 4 mm of rightward midline shift. There is a large amount of intraventricular extension into the lateral and third ventricles. Early entrapment of the right temporal horn. Vascular: No abnormal hyperdensity of the major intracranial arteries or dural venous sinuses. No intracranial atherosclerosis. Skull: The visualized skull  base, calvarium and extracranial soft tissues are normal. Sinuses/Orbits: No fluid levels or advanced mucosal thickening of the visualized paranasal sinuses. No  mastoid or middle ear effusion. The orbits are normal. IMPRESSION: 1. Intraparenchymal hematoma centered at the left basal ganglia with intraventricular extension and 4 mm of rightward midline shift. 2. Early entrapment of the right temporal horn. Critical Value/emergent results were called by telephone at the time of interpretation on 2020-01-17 at 11:34 pm to [facility], who verbally acknowledged these results. Electronically Signed   By: Deatra RobinsonKevin  Herman M.D.   On: 2020-01-17 23:35    Review of systems not obtained due to patient factors. Blood pressure 129/75, pulse (!) 115, temperature (!) 100.4 F (38 C), resp. rate (!) 21, height 5\' 2"  (1.575 m), weight 75.6 kg, SpO2 100 %. Patient is intubated.  She shows no eye-opening to noxious stimuli.  Her pupils are pinpoint with roving conjugate gaze.  She has a weak gag and cough reflex.  She extends her extremities to pain.  Examination of her head demonstrates a previous craniotomy scar which is well-healed.  No evidence of trauma.  Chest and abdomen benign.  Extremities free from injury deformity.  Assessment/Plan: Large dominant hemisphere basal ganglia hemorrhage with extension into left thalamus and midbrain.  Although there is some mild ventriculomegaly I do not think that hydrocephalus represents a significant problem for her.  At this point I believe patient suffered a devastating hemorrhage which no interventions are going to enable any type of functional recovery.  Sherilyn CooterHenry A Marino Rogerson 02/23/2020, 8:52 AM

## 2020-02-23 NOTE — Progress Notes (Signed)
Patient arrived to 4N25 from ED with no belongings, clothing, or jewelery at bedside.

## 2020-02-23 NOTE — Progress Notes (Signed)
Inpatient Diabetes Program Recommendations  AACE/ADA: New Consensus Statement on Inpatient Glycemic Control (2015)  Target Ranges:  Prepandial:   less than 140 mg/dL      Peak postprandial:   less than 180 mg/dL (1-2 hours)      Critically ill patients:  140 - 180 mg/dL   Lab Results  Component Value Date   GLUCAP 281 (H) 02/23/2020   HGBA1C 8.0 (H) 01/01/2020    Review of Glycemic Control Results for ROSE, HEGNER (MRN [id number]) as of 02/23/2020 11:20  Ref. Range 11/23/2019 12:12 02/04/2020 23:10 02/23/2020 03:30 02/23/2020 08:00  Glucose-Capillary Latest Ref Range: 70 - 99 mg/dL 527 (H) 782 (H) 423 (H) 281 (H)   Diabetes history: Type 2 DM Outpatient Diabetes medications: Novolog 0-9 units TID, Levemir 13 units BID Current orders for Inpatient glycemic control: Novolog 0-15 units Q4H  Inpatient Diabetes Program Recommendations:    Consider adding Levemir 15 units QD.   Thanks, Lujean Rave, MSN, RNC-OB Diabetes Coordinator [phone] (8a-5p)

## 2020-02-23 NOTE — Progress Notes (Signed)
SLP Cancellation Note  Patient Details Name: Latoya Cox MRN: [id number] DOB: Sep 16, 1965   Cancelled treatment:       Reason Eval/Treat Not Completed: Medical issues which prohibited therapy. Pt on vent. Will f/u as able.   Mahala Menghini., M.A. CCC-SLP Acute Rehabilitation Services Pager [phone] Office [phone]  02/23/2020, 7:17 AM

## 2020-02-23 NOTE — Progress Notes (Addendum)
STROKE TEAM PROGRESS NOTE   INTERVAL HISTORY  Some response to painful stimuli, very small amount of independent movement. Otherwise non-responsive.    Vitals:   02/23/20 0736 02/23/20 0745 02/23/20 0800 02/23/20 0810  BP:  137/79 (!) 150/93 129/75  Pulse:  (!) 109 (!) 123 (!) 115  Resp:  (!) 21 (!) 21 (!) 21  Temp:  100.2 F (37.9 C) (!) 100.4 F (38 C) (!) 100.4 F (38 C)  TempSrc:      SpO2: 100% 100% 100% 100%  Weight:      Height:       CBC:  Recent Labs  Lab 03/06/2020 2313 03/06/20 2318 2020/03/06 2323 02/23/20 0030  WBC 8.5  --   --   --   NEUTROABS 3.8  --   --   --   HGB 14.2   < > 15.0 15.0  HCT 45.0   < > 44.0 44.0  MCV 86.5  --   --   --   PLT 183  --   --   --    < > = values in this interval not displayed.   Basic Metabolic Panel:  Recent Labs  Lab 03-06-2020 2313 03-06-2020 2313 03-06-20 2318 03-06-20 2318 March 06, 2020 2323 02/23/20 0030  NA 138   < > 138   < > 138 140  K 2.8*   < > 2.7*   < > 2.9* 2.8*  CL 99   < > 100  --  100  --   CO2 24  --   --   --   --   --   GLUCOSE 290*   < > 292*  --  294*  --   BUN 19   < > 21*  --  25*  --   CREATININE 1.19*   < > 1.10*  --  1.10*  --   CALCIUM 9.6  --   --   --   --   --    < > = values in this interval not displayed.   Urine Drug Screen:  Recent Labs  Lab 2020/03/06 2309  LABOPIA NONE DETECTED  COCAINSCRNUR NONE DETECTED  LABBENZ NONE DETECTED  AMPHETMU NONE DETECTED  THCU NONE DETECTED  LABBARB NONE DETECTED    Alcohol Level  Recent Labs  Lab Mar 06, 2020 2309  ETH <10    IMAGING past 24 hours CT HEAD WO CONTRAST  Result Date: 02/23/2020 CLINICAL DATA:  Hemorrhagic stroke. EXAM: CT HEAD WITHOUT CONTRAST TECHNIQUE: Contiguous axial images were obtained from the base of the skull through the vertex without intravenous contrast. COMPARISON:  CT head without contrast 03/06/20 FINDINGS: Brain: Left basal ganglia hemorrhage has increased in size. Hemorrhage now measures 5.8 x 3.7 x 5.5 cm.  Surrounding vasogenic edema is increasing. Intraventricular hemorrhages again noted. High-density blood products are now present within the lateral ventricles bilaterally as well as the third and fourth ventricles. Right lateral ventricle is enlarging. Hemorrhage extends into the brainstem. Midline shift to is increased from 9 mm to 10 mm. Foramen magnum is patent. No new hemorrhage is present. No cortical infarcts are present. Right basal ganglia are within normal limits. The brainstem and cerebellum are otherwise within normal limits. Vascular: Atherosclerotic calcifications are present in the cavernous internal carotid arteries bilaterally. Aneurysm clip at the level of the left PICA is stable. Skull: Left frontal craniotomy is again noted. Left frontal burr hole noted. Calvarium is otherwise within normal limits. No significant extracranial soft tissue lesion is  present. Sinuses/Orbits: The paranasal sinuses and mastoid air cells are clear. The globes and orbits are within normal limits. IMPRESSION: 1. Interval increase in size of left basal ganglia hemorrhage now measuring 5.8 x 3.7 x 5.5 cm. 2. Increasing surrounding vasogenic edema. 3. Increasing mass effect with midline shift to 10 mm. 4. Progressive intraventricular hemorrhage and developing hydrocephalus with enlarging right lateral ventricle. 5. Stable aneurysm clip at the level of the left PICA. 6. Stable left frontal craniotomy. Critical Value/emergent results were called by telephone at the time of interpretation on 02/23/2020 at 6:03 am to provider [facility] , who verbally acknowledged these results. Electronically Signed   By: Marin Roberts M.D.   On: 02/23/2020 06:03   DG Chest Portable 1 View  Result Date: 02/14/2020 CLINICAL DATA:  Intubation EXAM: PORTABLE CHEST 1 VIEW COMPARISON:  None. FINDINGS: The heart size and mediastinal contours are within normal limits. ETT is 2.8 cm above the level of the carina. NG tube is seen within  the proximal stomach. No large airspace consolidation or pleural effusion. There is shallow degree of aeration with bibasilar subsegmental atelectasis. IMPRESSION: ETT and NG tube in satisfactory position. Bibasilar subsegmental atelectasis Electronically Signed   By: Jonna Clark M.D.   On: 02/02/2020 23:59   CT HEAD CODE STROKE WO CONTRAST  Result Date: 02/07/2020 CLINICAL DATA:  Code stroke.  Right-sided weakness EXAM: CT HEAD WITHOUT CONTRAST TECHNIQUE: Contiguous axial images were obtained from the base of the skull through the vertex without intravenous contrast. COMPARISON:  None. FINDINGS: Brain: Intraparenchymal hematoma centered at the left basal ganglia measures 4.6 x 4.0 x 3.4 cm (volume = 33 cm^3). 4 mm of rightward midline shift. There is a large amount of intraventricular extension into the lateral and third ventricles. Early entrapment of the right temporal horn. Vascular: No abnormal hyperdensity of the major intracranial arteries or dural venous sinuses. No intracranial atherosclerosis. Skull: The visualized skull base, calvarium and extracranial soft tissues are normal. Sinuses/Orbits: No fluid levels or advanced mucosal thickening of the visualized paranasal sinuses. No mastoid or middle ear effusion. The orbits are normal. IMPRESSION: 1. Intraparenchymal hematoma centered at the left basal ganglia with intraventricular extension and 4 mm of rightward midline shift. 2. Early entrapment of the right temporal horn. Critical Value/emergent results were called by telephone at the time of interpretation on 01/31/2020 at 11:34 pm to provider [facility], who verbally acknowledged these results. Electronically Signed   By: Deatra Robinson M.D.   On: 02/19/2020 23:35    PHYSICAL EXAM  Constitution: on ventilator, well-developed appearing HENT: [place]/AT Eyes: pupils non-reactive, right 4mm, left 70mm; right gaze deviation Cardio: tachycardic, regular rhythm Respiratory: on ventilator  Neuro:   Mental status: unable to assess, not responding to verbal stimuli Cranial nerves: unable to fully assess, pupils unreactive, 64mm right, 34mm left; spontaneous eye movement not tracking, right gaze deviation, no reaction to corneal stimuli; negative oculocephalic reflex.  Motor: extension with painful lower extremity stimuli, does not move to upper extremity painful stimuli, small amount spontaneous movement, reflexes intact Sensory: unable to assess  Plantars: mute  Cerebellar: unable to assess  Skin: c/d/i   ASSESSMENT/PLAN Latoya Cox is a 54 y.o. female with history of TIIDM, HTN, hemorrhagic stroke this past June with EVD presenting with increased hemorrhage now showing left basal ganglia hemorrhagic stroke with midline shift on CT in addition to extension into the lateral and third ventricles and entrapment of the right temporal horn.   Left Intraparenchymal  Hemorrhagic Stroke with midline shift  Hypertensive Emergency  Intubated 2/2 Inability to protect airway   No signifciant improvement overnight, gaze deviation no longer present and has eye movement and small amount non-purposeful movement but pupils non-reactive. Off propofol this morning  CT head with significant basal ganglia hemorrhage with midline shift and extension into the lateral and third ventricles with early entrapment of the right temporal horn.   She has been seen by neurosurgery. With extent of injury she is not a surgical candidate   Poor prognosis, will discuss with family   MRI brain ordered   Continue hypertonic saline 75 cc/hr with Na goal of 50-1 55 Na pending  TTE pending  Frequent neuro checks  LDL 186  HgbA1c 8.0  VTE prophylaxis - SCDs  Febrile to 100.8 - tylenol prn   Hypertension  Home meds: carvedilol 25 mg bid, HCTZ 12.5 mg qd, lisinopril 10 mg qd  BP now stable on cleviprex gtt, bp goal < 140 systolic   Not on labetalol    Other Active Problems  TIIDM: per primary    Hypokalemia: given K overnight. Mg pending. 11am bmp  Hospital day # 0  Neurology Attending Note : I have personally obtained history,examined this patient, reviewed notes, independently viewed imaging studies, participated in medical decision making and plan of care.ROS completed by me personally and pertinent positives fully documented  I have made any additions or clarifications directly to the above note. Agree with note above. She has unfortunately presented with now her second admission for intracerebral hemorrhage since June 2021.  As per my conversation with her husband she had made quite a significant improvement after the previous hemorrhage but now presented with much larger left basal ganglia hemorrhage with intraventricular extension, hydrocephalus and cytotoxic edema and brain herniation.  Her neurological exam is very poor with comatose state with unreactive pupils and corneal reflexes with preserved gag and mild respiratory effort about ventilator settings with partial extension on the left and no purposeful movements.  Neurosurgery feels she is not a candidate for ventriculostomy at this time.  Chances of surviving this catastrophic hemorrhage and making meaningful improvement are negligible.  I had a long discussion over the phone with the patient's husband and explained the poor prognosis.  He agrees to DNR for now but would like to have the patient's brother come and see her tomorrow before he makes decision about continuing life support versus withdrawal of care. Continue ventilatory support for now.  Strict control of hypertension and systolic blood pressure goal below 140 for first 24 hours and then below 160.  Continue hypertonic saline with serum sodium goal 150-155.  Keep normothermic, euvolemic and fluid I =0. This patient is critically ill and at significant risk of neurological worsening, death and care requires constant monitoring of vital signs, hemodynamics,respiratory and  cardiac monitoring, extensive review of multiple databases, frequent neurological assessment, discussion with family, other specialists and medical decision making of high complexity.I have made any additions or clarifications directly to the above note.This critical care time does not reflect procedure time, or teaching time or supervisory time of PA/NP/Med Resident etc but could involve care discussion time.  I spent 50 minutes of neurocritical care time  in the care of  this patient.      Delia Heady, MD Medical Director [facility] Pager: [phone] 02/23/2020 5:30 PM   To contact Stroke Continuity provider, please refer to [email or web address]. After hours, contact General Neurology

## 2020-02-23 NOTE — ED Triage Notes (Signed)
Pt arrived via ems due to stroke like symptoms, [id number]. Pt was talking to husband when she developed slurred speech and right  Side facial droop. Ems reports upon arrival only responsive to painful stimuli

## 2020-02-23 NOTE — Consult Note (Signed)
NAME:  Latoya Cox, MRN:  [id number], DOB:  [birth year], LOS: 0 ADMISSION DATE:  02/15/2020, CONSULTATION DATE: 02/23/2020 REFERRING MD: Dr. Theda Sers hemorrhage, CHIEF COMPLAINT: Acutely unresponsive  Brief History   Patient with a large left midline shift secondary to basal ganglia hemorrhage.  History of present illness   Patient is a 54 year old with history of hypertension diabetes mellitus and a recent intracranial hemorrhage who acutely began to have difficulty talking about 2200 yesterday evening.  On arrival of EMS patient was nonverbal without right-sided movement in right gaze.  On arrival to the emergency room the patient was intubated after it was found she has rightward midline shift.  She is not felt to be a neurosurgical candidate. On my evaluation the patient is on propofol and Cleviprex.  Post intubation pH 7.4 PCO2 43 PO2 of 204.  Potassium is 2.8, receiving replacement.  Hemoglobin is 15 platelet count is 183.  ET PTT are normal.  Glucose was 294.  Chest x-ray clear Patient was discharged in August after a prolonged rehabilitation stent due to thalamic stroke on 10/15/2019.  Past Medical History  Left thalamic intracranial intraventricular hemorrhage Obstructive hydrocephalus Acute hypoxic respiratory failure Acute metabolic encephalopathy Hypomagnesemia Severe hypokalemia Essential hypertension Dysphagia Diabetes mellitus, type II uncontrolled with hyperglycemia PSVT Hyperlipidemia Normocytic anemia  Significant Hospital Events   na  Consults:  PCCM,  Neurosurgery  Procedures:  NA  Significant Diagnostic Tests:  As above  Micro Data:  NA  Antimicrobials:  NA  Interim history/subjective:  NA  Objective   Blood pressure (!) 214/121, pulse 97, resp. rate 18, height _0  (1.575 m), weight 75.6 kg, SpO2 97 %.    Vent Mode: PRVC FiO2 (%):  [100 %] 100 % Set Rate:  [20 bmp] 20 bmp Vt Set:  [400 mL] 400 mL PEEP:  [5 [place] Pressure:   [14 cmH20] 14 cmH20  No intake or output data in the 24 hours ending 02/23/20 0135 Filed Weights   02/23/20 0120  Weight: 75.6 kg    Examination: General: Unresponsive female intubated on the ventilator HENT: Within normal limits Lungs: Clear Cardiovascular: Regular rate and rhythm Abdomen: Benign bowel sounds positive Extremities: Within normal limits Neuro: Sedated on propofol unresponsive deep pain no oculogyric reflex pupils pinpoint no clear corneal reflexes GU: NA  Resolved Hospital Problem list   NA  Assessment & Plan:  1. Status post large left basal ganglia hemorrhage: Prognosis grim.  Other management per neurology.  2.  Respiratory failure on ventilator: We will assist with ventilator management  3.  Hyperglycemia: We will monitor with supplemental insulin as needed  4.  Hyperkalemia: Currently receiving supplemental potassium.  Will recheck.  Best practice:  Diet: N.p.o. Pain/Anxiety/Delirium protocol (if indicated): On propofol VAP protocol (if indicated): Yes DVT prophylaxis: No Lovenox per neurology GI prophylaxis: Yes Glucose control: Yes Mobility: Bedrest Code Status: Currently full code Family Communication: N/A Disposition: To neuro ICU  Labs   CBC: Recent Labs  Lab 02/18/2020 2313 02/24/2020 2318 02/04/2020 2323 02/23/20 0030  WBC 8.5  --   --   --   NEUTROABS 3.8  --   --   --   HGB 14.2 15.3* 15.0 15.0  HCT 45.0 45.0 44.0 44.0  MCV 86.5  --   --   --   PLT 183  --   --   --     Basic Metabolic Panel: Recent Labs  Lab 02/21/2020 2313 02/23/2020 2318 02/14/2020 2323 02/23/20 0030  NA 138 138 138 140  K 2.8* 2.7* 2.9* 2.8*  CL 99 100 100  --   CO2 24  --   --   --   GLUCOSE 290* 292* 294*  --   BUN 19 21* 25*  --   CREATININE 1.19* 1.10* 1.10*  --   CALCIUM 9.6  --   --   --    GFR: Estimated Creatinine Clearance: 57 mL/min (A) (by C-G formula based on SCr of 1.1 mg/dL (H)). Recent Labs  Lab 02/25/2020 2313  WBC 8.5    Liver  Function Tests: Recent Labs  Lab 02/08/2020 2313  AST 20  ALT 14  ALKPHOS 67  BILITOT 1.2  PROT 7.4  ALBUMIN 4.4   No results for input(s): LIPASE, AMYLASE in the last 168 hours. No results for input(s): AMMONIA in the last 168 hours.  ABG    Component Value Date/Time   PHART 7.471 (H) 02/23/2020 0030   PCO2ART 43.6 02/23/2020 0030   PO2ART 204 (H) 02/23/2020 0030   HCO3 31.8 (H) 02/23/2020 0030   TCO2 33 (H) 02/23/2020 0030   O2SAT 100.0 02/23/2020 0030     Coagulation Profile: Recent Labs  Lab 01/29/2020 2313  INR 1.0    Cardiac Enzymes: No results for input(s): CKTOTAL, CKMB, CKMBINDEX, TROPONINI in the last 168 hours.  HbA1C: Hgb A1c MFr Bld  Date/Time Value Ref Range Status  01/01/2020 12:31 PM 8.0 (H) <5.7 % of total Hgb Final    Comment:    For someone without known diabetes, a hemoglobin A1c value of 6.5% or greater indicates that they may have  diabetes and this should be confirmed with a follow-up  test. . For someone with known diabetes, a value <7% indicates  that their diabetes is well controlled and a value  greater than or equal to 7% indicates suboptimal  control. A1c targets should be individualized based on  duration of diabetes, age, comorbid conditions, and  other considerations. . Currently, no consensus exists regarding use of hemoglobin A1c for diagnosis of diabetes for children. Marland Kitchen   10/15/2019 07:49 PM 9.1 (H) 4.8 - 5.6 % Final    Comment:    (NOTE)         Prediabetes: 5.7 - 6.4         Diabetes: >6.4         Glycemic control for adults with diabetes: <7.0     CBG: Recent Labs  Lab 02/14/2020 2310  GLUCAP 282*    Review of Systems:   Patient unresponsive and intubated.  Able to obtain.  Past Medical History  She,  has a past medical history of Arthritis, Chest wall pain, Diabetes mellitus without complication ([place]), and Hypertension.   Surgical History    Past Surgical History:  Procedure Laterality Date  . aneurism  repair    . lapband       Social History   reports that she has never smoked. She has never used smokeless tobacco. She reports that she does not drink alcohol and does not use drugs.   Family History   Her family history includes Arthritis in her father and mother; Diabetes in her father and mother; Hypertension in her father and mother; Stroke in her mother.   Allergies Allergies  Allergen Reactions  . Hydrocodone Itching    Pt states "if i take 2, it makes me itch"     Home Medications  Prior to Admission medications   Medication Sig Start Date  End Date Taking? Authorizing Provider  Ascorbic Acid (VITAMIN C) 100 MG tablet Take 100 mg by mouth daily.    [provider]  atorvastatin (LIPITOR) 40 MG tablet Take 1 tablet (40 mg total) by mouth daily. 02/08/20   Hoyt Koch, MD  Benzoyl Peroxide 10 % CREA Use on face twice daily 10/05/19   Hoyt Koch, MD  blood glucose meter kit and supplies KIT Dispense based on patient and insurance preference. Use up to four times daily as directed. (FOR ICD-9 250.00, 250.01). 01/01/20   Marrian Salvage, FNP  carvedilol (COREG) 25 MG tablet Take 1 tablet (25 mg total) by mouth 2 (two) times daily with a meal. 02/08/20   Hoyt Koch, MD  CLEVER CHEK LANCETS MISC Use daily to check sugars. 08/30/15   Hoyt Koch, MD  [employer] STOOL SOFTENER 100 MG capsule Take 1 capsule (100 mg total) by mouth daily. 01/27/20   Hoyt Koch, MD  [employer] VITAMIN C 250 MG tablet Take 1 tablet (250 mg total) by mouth daily. Take 250 mg by mouth daily. 01/27/20   Hoyt Koch, MD  fluticasone Asencion Islam) 50 MCG/ACT nasal spray Place 1 spray daily as needed into both nostrils for allergies. 03/04/17   Hoyt Koch, MD  hydrochlorothiazide (MICROZIDE) 12.5 MG capsule Take 1 capsule (12.5 mg total) by mouth daily. 02/08/20   Hoyt Koch, MD  insulin aspart (NOVOLOG) 100 UNIT/ML injection Sliding scale    CBG 70 - 120: 0 units  CBG 121 - 150: 1 unit,   CBG 151 - 200: 2 units,   CBG 201 - 250: 3 units,   CBG 251 - 300: 5 units,   CBG 301 - 350: 7 units,   CBG 351 - 400: 9 units    CBG > 400: 9 units and notify your MD 01/01/20   Marrian Salvage, FNP  insulin detemir (LEVEMIR) 100 UNIT/ML injection Inject 0.13 mLs (13 Units total) into the skin 2 (two) times daily. 01/01/20   Marrian Salvage, FNP  lisinopril (ZESTRIL) 10 MG tablet Take 1 tablet (10 mg total) by mouth daily. 02/08/20   Hoyt Koch, MD  Multiple Vitamins-Minerals (MULTIVITAMIN ADULT) TABS Take 1 tablet by mouth daily.    [provider]  nystatin-triamcinolone [facility] II) cream Sparingly Topical Daily 01/27/20   Hoyt Koch, MD  nystatin-triamcinolone ointment ([place]) APPLY TO AFFECTED AREA TWICE DAILY Patient taking differently: Apply 1 application topically 2 (two) times daily.  01/29/17   Hoyt Koch, MD  omeprazole (PRILOSEC) 40 MG capsule TAKE 1 CAPSULE BY MOUTH EVERY DAY 01/27/20   Hoyt Koch, MD  potassium chloride SA (KLOR-CON M20) 20 MEQ tablet Take 1 tablet (20 mEq total) by mouth daily. 02/08/20   Hoyt Koch, MD  zolpidem (AMBIEN) 5 MG tablet Take 1 tablet (5 mg total) by mouth at bedtime as needed. 01/27/20   Binnie Rail, MD     Critical care time: Over 35 minutes was spent bedside evaluation chart review and critical care planning

## 2020-02-23 NOTE — Progress Notes (Signed)
Informed by MRI that patient has an aneurysm clip and cannot have an MRI.

## 2020-02-24 ENCOUNTER — Other Ambulatory Visit: Payer: Self-pay

## 2020-02-24 DIAGNOSIS — I619 Nontraumatic intracerebral hemorrhage, unspecified: Secondary | ICD-10-CM

## 2020-02-24 LAB — GLUCOSE, CAPILLARY
Glucose-Capillary: 188 mg/dL — ABNORMAL HIGH (ref 70–99)
Glucose-Capillary: 188 mg/dL — ABNORMAL HIGH (ref 70–99)
Glucose-Capillary: 188 mg/dL — ABNORMAL HIGH (ref 70–99)
Glucose-Capillary: 199 mg/dL — ABNORMAL HIGH (ref 70–99)
Glucose-Capillary: 203 mg/dL — ABNORMAL HIGH (ref 70–99)
Glucose-Capillary: 233 mg/dL — ABNORMAL HIGH (ref 70–99)

## 2020-02-24 LAB — SODIUM
Sodium: 154 mmol/L — ABNORMAL HIGH (ref 135–145)
Sodium: 158 mmol/L — ABNORMAL HIGH (ref 135–145)
Sodium: 158 mmol/L — ABNORMAL HIGH (ref 135–145)
Sodium: 156 mmol/L — ABNORMAL HIGH (ref 135–145)
Sodium: 156 mmol/L — ABNORMAL HIGH (ref 135–145)

## 2020-02-24 MED ORDER — METOPROLOL TARTRATE 5 MG/5ML IV SOLN
5.0000 mg | Freq: Three times a day (TID) | INTRAVENOUS | Status: DC | PRN
  Administered 2020-02-24 – 2020-02-25 (×2): 5 mg via INTRAVENOUS
  Filled 2020-02-24 (×2): qty 5

## 2020-02-24 MED ORDER — LORAZEPAM 2 MG/ML IJ SOLN: 1.0000 mg | Freq: Once | INTRAMUSCULAR | Status: AC

## 2020-02-24 MED ORDER — LABETALOL HCL 5 MG/ML IV SOLN
20.0000 mg | INTRAVENOUS | Status: DC | PRN
  Administered 2020-02-24: 20 mg via INTRAVENOUS
  Administered 2020-02-24 – 2020-02-26 (×3): 40 mg via INTRAVENOUS
  Filled 2020-02-24 (×4): qty 8

## 2020-02-24 MED ORDER — LABETALOL HCL 5 MG/ML IV SOLN: 10.0000 mg | INTRAVENOUS | Status: DC | PRN

## 2020-02-24 MED ORDER — METOPROLOL TARTRATE 5 MG/5ML IV SOLN
INTRAVENOUS | Status: AC
  Administered 2020-02-24: 5 mg via INTRAVENOUS
  Filled 2020-02-24: qty 5

## 2020-02-24 MED ORDER — LORAZEPAM 2 MG/ML IJ SOLN
INTRAMUSCULAR | Status: AC
  Administered 2020-02-24: 1 mg via INTRAVENOUS
  Filled 2020-02-24: qty 1

## 2020-02-24 NOTE — Progress Notes (Signed)
OT Cancellation Note  Patient Details Name: Latoya Cox MRN: [id number] DOB: 06/01/1965   Cancelled Treatment:    Reason Eval/Treat Not Completed: Medical issues which prohibited therapy; noted continued plans for family meeting and determining appropriate POC. Pt tachy this AM and neurostorming. Will follow up for OT eval as pt appropriate and as able.  Marcy Siren, OT Acute Rehabilitation Services Pager [phone] Office [phone]   Orlando Penner 02/24/2020, 9:58 AM

## 2020-02-24 NOTE — Progress Notes (Addendum)
STROKE TEAM PROGRESS NOTE   INTERVAL HISTORY  Husband at bedside. Discussed prognosis and condition. He would like to wait for patient's brother to arrive tomorrow. All questions answered.  Patient condition slightly worse than yesterday with increasing tachycardia.  New finding of right forearm tenderness and redness ?  From IV site.  Patient seems to be getting to be more tachycardic and is having autonomic instability with episodes of blood pressure spikes and hypotension alternatingly  Vitals:   02/24/20 0400 02/24/20 0500 02/24/20 0600 02/24/20 0700  BP: 139/86 135/76 137/78 (!) 119/94  Pulse: (!) 128 (!) 132 (!) 134 (!) 121  Resp: (!) 23 (!) 26 (!) 25 (!) 21  Temp: 99.1 F (37.3 C) 99.3 F (37.4 C) 99.5 F (37.5 C) 99.7 F (37.6 C)  TempSrc:      SpO2: 96% 97% 97% 98%  Weight:      Height:       CBC:  Recent Labs  Lab 02/21/2020 2313 02/02/2020 2318 01/30/2020 2323 02/23/20 0030  WBC 8.5  --   --   --   NEUTROABS 3.8  --   --   --   HGB 14.2   < > 15.0 15.0  HCT 45.0   < > 44.0 44.0  MCV 86.5  --   --   --   PLT 183  --   --   --    < > = values in this interval not displayed.   Basic Metabolic Panel:  Recent Labs  Lab 02/12/2020 2313 02/26/2020 2313 01/29/2020 2318 02/13/2020 2318 02/03/2020 2323 01/31/2020 2323 02/23/20 0030 02/23/20 0030 02/23/20 0816 02/23/20 1555 02/23/20 2015 02/24/20 0107  NA 138   < > 138   < > 138   < > 140   < > 144   < > 151* 154*  K 2.8*   < > 2.7*   < > 2.9*  --  2.8*  --   --   --   --   --   CL 99   < > 100  --  100  --   --   --   --   --   --   --   CO2 24  --   --   --   --   --   --   --   --   --   --   --   GLUCOSE 290*   < > 292*  --  294*  --   --   --   --   --   --   --   BUN 19   < > 21*  --  25*  --   --   --   --   --   --   --   CREATININE 1.19*   < > 1.10*  --  1.10*  --   --   --   --   --   --   --   CALCIUM 9.6  --   --   --   --   --   --   --   --   --   --   --   MG  --   --   --   --   --   --   --   --  1.9  --   --    --    < > = values in this interval not displayed.  Urine Drug Screen:  Recent Labs  Lab 2020-03-22 2309  LABOPIA NONE DETECTED  COCAINSCRNUR NONE DETECTED  LABBENZ NONE DETECTED  AMPHETMU NONE DETECTED  THCU NONE DETECTED  LABBARB NONE DETECTED    Alcohol Level  Recent Labs  Lab 03/22/2020 2309  ETH <10    IMAGING past 24 hours No results found.  PHYSICAL EXAM [place]: on ventilator no longer weaning HENT: [place]/AT Eyes: pupils 3 mm non-reactive, spontaneous side-to-side movements with right gaze preference Cardio: tachycardic, regular rhythm, upper extremity swelling Neuro: Mental status: comatose, non-verbal does not follow any commands Cranial nerves: pupils nonreactive, spontaneous eye movement not tracking, no reaction to corneal stimuli, gag reflex intact  Motor: small amount of movement with LLE painful stimuli and LUE painful stimuli, otherwise no response. Small amount of spontaneous movement LLE, non-directed Sensory: unable to assess Skin: redness/warmth/swelling left forearm    ASSESSMENT/PLAN Ms. Dema Timmons is a 54 y.o. female with history of TIIDM, HNT, hemorrhagic stroke June 2021 with EVD placed presenting with recurrence and increased hemorrhage into left basal ganglia with midline shift and brain herniation.    Left Intraparenchymal Hemorrhagic stroke with brain herniation  Hypertensive Emergency Intubated 2/2 inability to protect airway  Sympathetic Hyperactivity  CT head with significant basal ganglia hemorrhage with midline shift and extension into the lateral and third ventricles with early entrapment of the right temporal horn.   Patient neurological status unfortunately without hope for neurological recovery with extent of bleed and unable to undergo surgery. Condition additonally worsening with sympathetic hyperactivity of hypertension, fever, and tachycardia. Husband is aware and understands her condition is terminal. He and the patient  have discussed this previously and she would not want prolonged ventilatory support. He would like to continue ventilation and supportive care until patient's brother arrives tomorrow. Discussed that with current level of illness she may pass prior to his arrival.   Code status discussed yesterday with husband, and she is now DNR.   Hypertonic saline stopped with Na this am 158, goal 145-155.    2D Echo EF 60-65% with mild LV hypertrophy  Carotid doppler: no significant carotid or left vertebral artery stenosis. Unable to visualize right vertebral artery   Labetalol prn and cleviprex gtt prn for Blood pressure goal systolic <160  LDL 186  HgbA1c 8.0  VTE prophylaxis - SCDs    Diet   Diet NPO time specified    Hypertension  Home meds: carvedilol 25 mg bid, HCTZ 12.5 mg qd, lisinopril 10 mg qd  BP now stable on cleviprex gtt, bp goal < 140 systolic   Not on labetalol   Diabetes type II   HgbA1c 8.0, goal < 7.0  CBGs Recent Labs    02/23/20 1945 02/23/20 2326 02/24/20 0326  GLUCAP 245* 273* 203*      SSI  Other Hospital Problems: Right arm swelling: per PCCM. Recommend cold compress and elevation for now. If phlebitis or DVT present would not qualify for anticoagulation with head bleed regardless.   Hospital day # 1 I have personally obtained history,examined this patient, reviewed notes, independently viewed imaging studies, participated in medical decision making and plan of care.ROS completed by me personally and pertinent positives fully documented  I have made any additions or clarifications directly to the above note. Agree with note above.  Patient neurological condition expectedly continues to worsen with some autonomic storming and poor exam.  I had a long discussion at the bedside with the patient's husband about her condition and she is  unlikely to survive and even if she does she is going to end up in a persistent vegetative state requiring prolonged  ventilatory support, tracheostomy, PEG tube and nursing home care.  He clearly feels she would not want it and he agrees to no cardiac compression or cardioversion but would like to wait till tomorrow to speak to her brother before making final decision about withdrawal of care.  Discussed with Dr. Wynona Neat critical care medicine This patient is critically ill and at significant risk of neurological worsening, death and care requires constant monitoring of vital signs, hemodynamics,respiratory and cardiac monitoring, extensive review of multiple databases, frequent neurological assessment, discussion with family, other specialists and medical decision making of high complexity.I have made any additions or clarifications directly to the above note.This critical care time does not reflect procedure time, or teaching time or supervisory time of PA/NP/Med Resident etc but could involve care discussion time.  I spent 30 minutes of neurocritical care time  in the care of  this patient.     Delia Heady, MD  Delia Heady, MD Medical Director [facility] Pager: [phone] 02/24/2020 4:07 PM   To contact Stroke Continuity provider, please refer to [email or web address]. After hours, contact General Neurology

## 2020-02-24 NOTE — Progress Notes (Signed)
NAME:  Latoya Cox, MRN:  [id number], DOB:  [birth year], LOS: 1 ADMISSION DATE:  02/06/2020, CONSULTATION DATE:  02/23/2020 REFERRING MD: Dr. Thomasena Edis, CHIEF COMPLAINT: Unresponsiveness  Brief History   Patient with a large left midline shift secondary to basal ganglia hemorrhage  History of present illness   Patient is a 54 year old with history of hypertension diabetes mellitus and a recent intracranial hemorrhage who acutely began to have difficulty talking about 2200 yesterday evening.  On arrival of EMS patient was nonverbal without right-sided movement in right gaze.  On arrival to the emergency room the patient was intubated after it was found she has rightward midline shift.  She is not felt to be a neurosurgical candidate. On my evaluation the patient is on propofol and Cleviprex.  Post intubation pH 7.4 PCO2 43 PO2 of 204.  Potassium is 2.8, receiving replacement.  Hemoglobin is 15 platelet count is 183.  ET PTT are normal.  Glucose was 294.  Chest x-ray clear Patient was discharged in August after a prolonged rehabilitation stent due to thalamic stroke on 10/15/2019.  Past Medical History   Past Medical History:  Diagnosis Date  . Arthritis   . Chest wall pain   . Diabetes mellitus without complication (HCC)   . Hypertension     Significant Hospital Events     Consults:  PCCM Neurosurgery  Procedures:  None  Significant Diagnostic Tests:  CT head 10/26-0547 IMPRESSION: 1. Interval increase in size of left basal ganglia hemorrhage now measuring 5.8 x 3.7 x 5.5 cm. 2. Increasing surrounding vasogenic edema. 3. Increasing mass effect with midline shift to 10 mm. 4. Progressive intraventricular hemorrhage and developing hydrocephalus with enlarging right lateral ventricle. 5. Stable aneurysm clip at the level of the left PICA. 6. Stable left frontal craniotomy.  Micro Data:  MRSA by PCR negative SARS negative Influenza negative  Antimicrobials:   None  Interim history/subjective:  On propofol No purposeful movements  Objective   Blood pressure (!) 155/85, pulse (!) 126, temperature 99.7 F (37.6 C), resp. rate (!) 29, height 5\' 2"  (1.575 m), weight 75.6 kg, SpO2 97 %.    Vent Mode: PRVC FiO2 (%):  [40 %] 40 % Set Rate:  [20 bmp] 20 bmp Vt Set:  [400 mL] 400 mL PEEP:  [5 cmH20] 5 cmH20 Pressure Support:  [8 cmH20] 8 cmH20 Plateau Pressure:  [10 cmH20-12 cmH20] 10 cmH20   Intake/Output Summary (Last 24 hours) at 02/24/2020 0920 Last data filed at 02/24/2020 0700 Gross per 24 hour  Intake 2397.87 ml  Output 1800 ml  Net 597.87 ml   Filed Weights   02/23/20 0120  Weight: 75.6 kg    Examination: General: Unresponsive, sedated HENT: Endotracheal tube in place Lungs: Clear breath sounds bilaterally Cardiovascular: S1-S2 appreciated Abdomen: Soft, bowel sounds appreciated Extremities: No edema, no clubbing Neuro: Remains unresponsive however sedated at present GU:   Most recent arterial blood gas 7.47/44/204  Reviewed CT  Resolved Hospital Problem list     Assessment & Plan:  .  Large left basal ganglia hemorrhage -Prognosis is grim  -Catastrophic bleed, seen by neurosurgery, no surgical intervention will be of benefit  -Continue blood pressure management with Cleviprex   .Respiratory failure  -Continue full ventilator support  -VAP bundle in place  -Weaning per protocol   .Hyperglycemia  -Continue SSI    Prognosis remains very poor  Goals of care discussions ongoing with family  Family meeting today regarding next steps of care  -Patient will not  want to be on a ventilator in a vegetative state with no meaningful chance of recovery    Best practice:  Diet: N.p.o. Pain/Anxiety/Delirium protocol (if indicated): Sedation on hold VAP protocol (if indicated): In place DVT prophylaxis:  GI prophylaxis: Protonix Glucose control: SSI Mobility: Bedrest Code Status: Full code Family Communication:  Per primary Disposition: Intensive care unit  Labs   CBC: Recent Labs  Lab 02/27/2020 2313 02/02/2020 2318 01/31/2020 2323 02/23/20 0030  WBC 8.5  --   --   --   NEUTROABS 3.8  --   --   --   HGB 14.2 15.3* 15.0 15.0  HCT 45.0 45.0 44.0 44.0  MCV 86.5  --   --   --   PLT 183  --   --   --     Basic Metabolic Panel: Recent Labs  Lab 02/18/2020 2313 02/15/2020 2313 02/17/2020 2318 02/11/2020 2318 02/25/2020 2323 02/15/2020 2323 02/23/20 0030 02/23/20 0816 02/23/20 1555 02/23/20 2015 02/24/20 0107  NA 138   < > 138   < > 138   < > 140 144 148* 151* 154*  K 2.8*  --  2.7*  --  2.9*  --  2.8*  --   --   --   --   CL 99  --  100  --  100  --   --   --   --   --   --   CO2 24  --   --   --   --   --   --   --   --   --   --   GLUCOSE 290*  --  292*  --  294*  --   --   --   --   --   --   BUN 19  --  21*  --  25*  --   --   --   --   --   --   CREATININE 1.19*  --  1.10*  --  1.10*  --   --   --   --   --   --   CALCIUM 9.6  --   --   --   --   --   --   --   --   --   --   MG  --   --   --   --   --   --   --  1.9  --   --   --    < > = values in this interval not displayed.   GFR: Estimated Creatinine Clearance: 57 mL/min (A) (by C-G formula based on SCr of 1.1 mg/dL (H)). Recent Labs  Lab 02/10/2020 2313  WBC 8.5    Liver Function Tests: Recent Labs  Lab 02/20/2020 2313  AST 20  ALT 14  ALKPHOS 67  BILITOT 1.2  PROT 7.4  ALBUMIN 4.4   No results for input(s): LIPASE, AMYLASE in the last 168 hours. No results for input(s): AMMONIA in the last 168 hours.  ABG    Component Value Date/Time   PHART 7.471 (H) 02/23/2020 0030   PCO2ART 43.6 02/23/2020 0030   PO2ART 204 (H) 02/23/2020 0030   HCO3 31.8 (H) 02/23/2020 0030   TCO2 33 (H) 02/23/2020 0030   O2SAT 100.0 02/23/2020 0030     Coagulation Profile: Recent Labs  Lab 02/26/2020 2313  INR 1.0    Cardiac Enzymes: No results for input(s): CKTOTAL, CKMB,  CKMBINDEX, TROPONINI in the last 168 hours.  HbA1C: Hgb A1c  MFr Bld  Date/Time Value Ref Range Status  01/01/2020 12:31 PM 8.0 (H) <5.7 % of total Hgb Final    Comment:    For someone without known diabetes, a hemoglobin A1c value of 6.5% or greater indicates that they may have  diabetes and this should be confirmed with a follow-up  test. . For someone with known diabetes, a value <7% indicates  that their diabetes is well controlled and a value  greater than or equal to 7% indicates suboptimal  control. A1c targets should be individualized based on  duration of diabetes, age, comorbid conditions, and  other considerations. . Currently, no consensus exists regarding use of hemoglobin A1c for diagnosis of diabetes for children. Marland Kitchen   10/15/2019 07:49 PM 9.1 (H) 4.8 - 5.6 % Final    Comment:    (NOTE)         Prediabetes: 5.7 - 6.4         Diabetes: >6.4         Glycemic control for adults with diabetes: <7.0     CBG: Recent Labs  Lab 02/23/20 1537 02/23/20 1945 02/23/20 2326 02/24/20 0326 02/24/20 0818  GLUCAP 271* 245* 273* 203* 188*    Review of Systems:   Unobtainable  Past Medical History  She,  has a past medical history of Arthritis, Chest wall pain, Diabetes mellitus without complication (HCC), and Hypertension.   Surgical History    Past Surgical History:  Procedure Laterality Date  . aneurism repair    . lapband       Social History   reports that she has never smoked. She has never used smokeless tobacco. She reports that she does not drink alcohol and does not use drugs.   Family History   Her family history includes Arthritis in her father and mother; Diabetes in her father and mother; Hypertension in her father and mother; Stroke in her mother.   Allergies Allergies  Allergen Reactions  . Hydrocodone Itching    Pt states "if i take 2, it makes me itch"    The patient is critically ill with multiple organ systems failure and requires high complexity decision making for assessment and support,  frequent evaluation and titration of therapies, application of advanced monitoring technologies and extensive interpretation of multiple databases. Critical Care Time devoted to patient care services described in this note independent of APP/resident time (if applicable)  is 30 minutes.   Virl Diamond MD [facility] Personal pager: [phone] If unanswered, please page CCM On-call: #[phone]

## 2020-02-24 NOTE — Progress Notes (Signed)
SLP Cancellation Note  Patient Details Name: Latoya Cox MRN: [id number] DOB: 1966/04/19   Cancelled treatment:       Reason Eval/Treat Not Completed: Medical issues which prohibited therapy. Remains on vent. Per chart, family planning to come today and pt with poor prognosis.    Mahala Menghini., M.A. CCC-SLP Acute Rehabilitation Services Pager [phone] Office [phone]  02/24/2020, 8:29 AM

## 2020-02-24 NOTE — Progress Notes (Signed)
PT Cancellation Note  Patient Details Name: Latoya Cox MRN: [id number] DOB: August 04, 1965   Cancelled Treatment:    Reason Eval/Treat Not Completed: Other (comment). Per RN family meeting with MD today about withdrawing care and transitioning to comfort care. Pt posturing and neurostorming. Will await for family meeting to determine POC and complete PT eval if desired by family as able.  Lewis Shock, PT, DPT Acute Rehabilitation Services Pager #: [phone] Office #: [phone]    [name] 02/24/2020, 9:16 AM

## 2020-02-24 NOTE — Progress Notes (Signed)
1500 Pharmacist and MD to bedside to assess warmth and redness in bother upper extremities. First noticed the left arm this morning and it is a new finding in the right arm. Does not appear to be IV infiltration. Will continue to monitor.

## 2020-02-25 DIAGNOSIS — I619 Nontraumatic intracerebral hemorrhage, unspecified: Secondary | ICD-10-CM | POA: Diagnosis not present

## 2020-02-25 LAB — GLUCOSE, CAPILLARY
Glucose-Capillary: 243 mg/dL — ABNORMAL HIGH (ref 70–99)
Glucose-Capillary: 260 mg/dL — ABNORMAL HIGH (ref 70–99)
Glucose-Capillary: 218 mg/dL — ABNORMAL HIGH (ref 70–99)
Glucose-Capillary: 164 mg/dL — ABNORMAL HIGH (ref 70–99)
Glucose-Capillary: 165 mg/dL — ABNORMAL HIGH (ref 70–99)
Glucose-Capillary: 166 mg/dL — ABNORMAL HIGH (ref 70–99)

## 2020-02-25 LAB — SODIUM
Sodium: 154 mmol/L — ABNORMAL HIGH (ref 135–145)
Sodium: 158 mmol/L — ABNORMAL HIGH (ref 135–145)

## 2020-02-25 MED ORDER — FENTANYL BOLUS VIA INFUSION
50.0000 ug | INTRAVENOUS | Status: DC | PRN
  Filled 2020-02-25: qty 50

## 2020-02-25 MED ORDER — INSULIN DETEMIR 100 UNIT/ML ~~LOC~~ SOLN
10.0000 [IU] | Freq: Two times a day (BID) | SUBCUTANEOUS | Status: DC
  Administered 2020-02-25 – 2020-02-27 (×5): 10 [IU] via SUBCUTANEOUS
  Filled 2020-02-25 (×6): qty 0.1

## 2020-02-25 MED ORDER — SODIUM CHLORIDE 0.9 % IV SOLN: INTRAVENOUS | Status: DC

## 2020-02-25 MED ORDER — FENTANYL 2500MCG IN NS 250ML (10MCG/ML) PREMIX INFUSION
50.0000 ug/h | INTRAVENOUS | Status: DC
  Administered 2020-02-25: 50 ug/h via INTRAVENOUS
  Administered 2020-02-27: 25 ug/h via INTRAVENOUS
  Filled 2020-02-25 (×2): qty 250

## 2020-02-25 MED ORDER — FENTANYL CITRATE (PF) 100 MCG/2ML IJ SOLN
50.0000 ug | Freq: Once | INTRAMUSCULAR | Status: AC
  Administered 2020-02-25: 50 ug via INTRAVENOUS

## 2020-02-25 NOTE — Progress Notes (Signed)
OT Cancellation and Discharge Note  Patient Details Name: Latoya Cox MRN: [id number] DOB: [birth year]   Cancelled Treatment:    Reason Eval/Treat Not Completed: Patient not medically ready.  Discussed with MD.  OT will sign off.    Eber Jones., OTR/L Acute Rehabilitation Services Pager [phone] Office [phone]    02/25/2020, 9:56 AM

## 2020-02-25 NOTE — Progress Notes (Signed)
PT Cancellation Note  Patient Details Name: Latoya Cox MRN: [id number] DOB: 10/21/1965   Cancelled Treatment:    Reason Eval/Treat Not Completed: Medical issues which prohibited therapy;Patient not medically ready.  Per discussion with MD, will sign off at this time. 02/25/2020  Jacinto Halim., PT Acute Rehabilitation Services [phone]  (pager) [phone]  (office)   [name] 02/25/2020, 2:25 PM

## 2020-02-25 NOTE — Progress Notes (Signed)
NAME:  Latoya Cox, MRN:  [id number], DOB:  Nov 15, 1965, LOS: 2 ADMISSION DATE:  02-23-20, CONSULTATION DATE:  02/23/2020 REFERRING MD: Dr. Thomasena Edis, CHIEF COMPLAINT: Unresponsiveness  Brief History   Patient with a large left midline shift secondary to basal ganglia hemorrhage  History of present illness   Patient is a 54 year old with history of hypertension diabetes mellitus and a recent intracranial hemorrhage who acutely began to have difficulty talking about 2200 yesterday evening.  On arrival of EMS patient was nonverbal without right-sided movement in right gaze.  On arrival to the emergency room the patient was intubated after it was found she has rightward midline shift.  She is not felt to be a neurosurgical candidate. On my evaluation the patient is on propofol and Cleviprex.  Post intubation pH 7.4 PCO2 43 PO2 of 204.  Potassium is 2.8, receiving replacement.  Hemoglobin is 15 platelet count is 183.  ET PTT are normal.  Glucose was 294.  Chest x-ray clear Patient was discharged in August after a prolonged rehabilitation stent due to thalamic stroke on 10/15/2019.  Past Medical History   Past Medical History:  Diagnosis Date  . Arthritis   . Chest wall pain   . Diabetes mellitus without complication (HCC)   . Hypertension     Significant Hospital Events     Consults:  PCCM Neurosurgery  Procedures:  None  Significant Diagnostic Tests:  CT head 10/26-0547 IMPRESSION: 1. Interval increase in size of left basal ganglia hemorrhage now measuring 5.8 x 3.7 x 5.5 cm. 2. Increasing surrounding vasogenic edema. 3. Increasing mass effect with midline shift to 10 mm. 4. Progressive intraventricular hemorrhage and developing hydrocephalus with enlarging right lateral ventricle. 5. Stable aneurysm clip at the level of the left PICA. 6. Stable left frontal craniotomy.  Micro Data:  MRSA by PCR negative SARS negative Influenza negative  Antimicrobials:   None  Interim history/subjective:  On propofol No purposeful movements Posturing  Objective   Blood pressure 136/73, pulse (!) 120, temperature (!) 101.1 F (38.4 C), resp. rate (!) 24, height 5\' 2"  (1.575 m), weight 75.6 kg, SpO2 95 %.    Vent Mode: PRVC FiO2 (%):  [30 %-40 %] 30 % Set Rate:  [20 bmp] 20 bmp Vt Set:  [400 mL] 400 mL PEEP:  [5 cmH20] 5 cmH20 Plateau Pressure:  [9 cmH20-16 cmH20] 16 cmH20   Intake/Output Summary (Last 24 hours) at 02/25/2020 0914 Last data filed at 02/25/2020 0700 Gross per 24 hour  Intake 1119.43 ml  Output 775 ml  Net 344.43 ml   Filed Weights   02/23/20 0120  Weight: 75.6 kg    Examination: General: Unresponsive, sedated HENT: Endotracheal tube in place Lungs: Clear breath sounds bilaterally Cardiovascular: S1-S2 appreciated Abdomen: Soft, bowel sounds appreciated Neuro: Unresponsive, sedated GU:   Most recent arterial blood gas 7.47/44/204  Reviewed CT-last on 02/23/2020  Resolved Hospital Problem list     Assessment & Plan:  .  Large left basal ganglia hemorrhage -Prognosis is grim -Catastrophic bleed with no surgical options as per neurosurgery -Continue blood pressure management Cleviprex  Respiratory failure -Continue ventilator support -VAP bundle in place  Hyperglycemia -Continue SSI  Currently on propofol for sedation Added fentanyl     Prognosis remains very poor  Goals of care discussions ongoing with family   Did update spouse on 10/27 He is speaking with family members and down possibly compassionate withdrawal of aggressive interventions on 10/29  Best practice:  Diet: N.p.o. Pain/Anxiety/Delirium protocol (if  indicated): Propofol, added fentanyl VAP protocol (if indicated): In place DVT prophylaxis:  GI prophylaxis: Protonix Glucose control: SSI Mobility: Bedrest Code Status: Full code Family Communication: Per primary Disposition: Intensive care unit  Labs   CBC: Recent Labs  Lab  01/31/2020 2313 02/01/2020 2318 02/18/2020 2323 02/23/20 0030  WBC 8.5  --   --   --   NEUTROABS 3.8  --   --   --   HGB 14.2 15.3* 15.0 15.0  HCT 45.0 45.0 44.0 44.0  MCV 86.5  --   --   --   PLT 183  --   --   --     Basic Metabolic Panel: Recent Labs  Lab 02/13/2020 2313 02/08/2020 2313 01/31/2020 2318 02/26/2020 2318 02/21/2020 2323 02/13/2020 2323 02/23/20 0030 02/23/20 0030 02/23/20 0816 02/23/20 1555 02/24/20 0846 02/24/20 1147 02/24/20 1830 02/24/20 1835 02/25/20 0711  NA 138   < > 138   < > 138   < > 140   < > 144   < > 158* 158* 156* 156* 154*  K 2.8*  --  2.7*  --  2.9*  --  2.8*  --   --   --   --   --   --   --   --   CL 99  --  100  --  100  --   --   --   --   --   --   --   --   --   --   CO2 24  --   --   --   --   --   --   --   --   --   --   --   --   --   --   GLUCOSE 290*  --  292*  --  294*  --   --   --   --   --   --   --   --   --   --   BUN 19  --  21*  --  25*  --   --   --   --   --   --   --   --   --   --   CREATININE 1.19*  --  1.10*  --  1.10*  --   --   --   --   --   --   --   --   --   --   CALCIUM 9.6  --   --   --   --   --   --   --   --   --   --   --   --   --   --   MG  --   --   --   --   --   --   --   --  1.9  --   --   --   --   --   --    < > = values in this interval not displayed.   GFR: Estimated Creatinine Clearance: 57 mL/min (A) (by C-G formula based on SCr of 1.1 mg/dL (H)). Recent Labs  Lab 02/26/2020 2313  WBC 8.5    Liver Function Tests: Recent Labs  Lab 02/18/2020 2313  AST 20  ALT 14  ALKPHOS 67  BILITOT 1.2  PROT 7.4  ALBUMIN 4.4   No results for input(s): LIPASE, AMYLASE in the  last 168 hours. No results for input(s): AMMONIA in the last 168 hours.  ABG    Component Value Date/Time   PHART 7.471 (H) 02/23/2020 0030   PCO2ART 43.6 02/23/2020 0030   PO2ART 204 (H) 02/23/2020 0030   HCO3 31.8 (H) 02/23/2020 0030   TCO2 33 (H) 02/23/2020 0030   O2SAT 100.0 02/23/2020 0030     Coagulation Profile: Recent  Labs  Lab 02/23/2020 2313  INR 1.0    Cardiac Enzymes: No results for input(s): CKTOTAL, CKMB, CKMBINDEX, TROPONINI in the last 168 hours.  HbA1C: Hgb A1c MFr Bld  Date/Time Value Ref Range Status  01/01/2020 12:31 PM 8.0 (H) <5.7 % of total Hgb Final    Comment:    For someone without known diabetes, a hemoglobin A1c value of 6.5% or greater indicates that they may have  diabetes and this should be confirmed with a follow-up  test. . For someone with known diabetes, a value <7% indicates  that their diabetes is well controlled and a value  greater than or equal to 7% indicates suboptimal  control. A1c targets should be individualized based on  duration of diabetes, age, comorbid conditions, and  other considerations. . Currently, no consensus exists regarding use of hemoglobin A1c for diagnosis of diabetes for children. Marland Kitchen   10/15/2019 07:49 PM 9.1 (H) 4.8 - 5.6 % Final    Comment:    (NOTE)         Prediabetes: 5.7 - 6.4         Diabetes: >6.4         Glycemic control for adults with diabetes: <7.0     CBG: Recent Labs  Lab 02/24/20 1554 02/24/20 1935 02/24/20 2331 02/25/20 0354 02/25/20 0816  GLUCAP 199* 233* 188* 243* 260*    Review of Systems:   Unobtainable  Past Medical History  She,  has a past medical history of Arthritis, Chest wall pain, Diabetes mellitus without complication (HCC), and Hypertension.   Surgical History    Past Surgical History:  Procedure Laterality Date  . aneurism repair    . lapband       Social History   reports that she has never smoked. She has never used smokeless tobacco. She reports that she does not drink alcohol and does not use drugs.   Family History   Her family history includes Arthritis in her father and mother; Diabetes in her father and mother; Hypertension in her father and mother; Stroke in her mother.   Allergies Allergies  Allergen Reactions  . Hydrocodone Itching    Pt states "if i take 2, it  makes me itch"    The patient is critically ill with multiple organ systems failure and requires high complexity decision making for assessment and support, frequent evaluation and titration of therapies, application of advanced monitoring technologies and extensive interpretation of multiple databases. Critical Care Time devoted to patient care services described in this note independent of APP/resident time (if applicable)  is 30 minutes.   Virl Diamond MD [facility] Personal pager: [phone] If unanswered, please page CCM On-call: #[phone]

## 2020-02-25 NOTE — Progress Notes (Signed)
Patient's urine output decreased to 20/ml hr. Notified Dr. Wynona Neat and received new orders for IVF. See [facility]

## 2020-02-25 NOTE — Progress Notes (Addendum)
STROKE TEAM PROGRESS NOTE   INTERVAL HISTORY Husband not at bedside this morning. Patient status unchanged from yesterday, requiring more medication for blood pressure control.   Vitals:   02/25/20 0600 02/25/20 0630 02/25/20 0700 02/25/20 0824  BP: 121/64 123/70 136/73   Pulse: (!) 126 (!) 117 (!) 109 (!) 120  Resp: (!) 25 (!) 27 (!) 24 (!) 24  Temp: (!) 101.3 F (38.5 C) (!) 101.3 F (38.5 C) (!) 101.3 F (38.5 C) (!) 101.1 F (38.4 C)  TempSrc:      SpO2: 100% 100% 98% 95%  Weight:      Height:       CBC:  Recent Labs  Lab March 05, 2020 2313 2020-03-05 2318 2020-03-05 2323 02/23/20 0030  WBC 8.5  --   --   --   NEUTROABS 3.8  --   --   --   HGB 14.2   < > 15.0 15.0  HCT 45.0   < > 44.0 44.0  MCV 86.5  --   --   --   PLT 183  --   --   --    < > = values in this interval not displayed.   Basic Metabolic Panel:  Recent Labs  Lab 2020-03-05 2313 05-Mar-2020 2313 03-05-20 2318 05-Mar-2020 2318 05-Mar-2020 2323 03/05/2020 2323 02/23/20 0030 02/23/20 0030 02/23/20 0816 02/23/20 1555 02/24/20 1830 02/24/20 1835  NA 138   < > 138   < > 138   < > 140   < > 144   < > 156* 156*  K 2.8*   < > 2.7*   < > 2.9*  --  2.8*  --   --   --   --   --   CL 99   < > 100  --  100  --   --   --   --   --   --   --   CO2 24  --   --   --   --   --   --   --   --   --   --   --   GLUCOSE 290*   < > 292*  --  294*  --   --   --   --   --   --   --   BUN 19   < > 21*  --  25*  --   --   --   --   --   --   --   CREATININE 1.19*   < > 1.10*  --  1.10*  --   --   --   --   --   --   --   CALCIUM 9.6  --   --   --   --   --   --   --   --   --   --   --   MG  --   --   --   --   --   --   --   --  1.9  --   --   --    < > = values in this interval not displayed.   Urine Drug Screen:  Recent Labs  Lab 03-05-20 2309  LABOPIA NONE DETECTED  COCAINSCRNUR NONE DETECTED  LABBENZ NONE DETECTED  AMPHETMU NONE DETECTED  THCU NONE DETECTED  LABBARB NONE DETECTED    Alcohol Level  Recent Labs  Lab  2020-03-05 2309  ETH <10    IMAGING past 24 hours No results found.  PHYSICAL EXAM [place]: on ventilator, comatose  HENT: [place]/AT Eyes: pupils 3 mm non-reactive, slow spontaneous side-to-side eye movement Cardio: tachycardic, regular rhythm, upper extremity swelling Neuro: Mental status: comatose Cranial nerves: pupils nonreactive, spontaneous eye movement not tracking, no reaction to corneal stimuli, gag reflex intact  Motor: small amount of extensor movement with painful stimuli  Sensory: unable to assess   ASSESSMENT/PLAN [name] is a 54 y.o. female with history of TIIDM, HNT, hemorrhagic stroke June 2021 with EVD placed presenting with recurrence and increased hemorrhage into left basal ganglia with midline shift and brain herniation.    Left Intraparenchymal Hemorrhagic stroke with brain herniation  Hypertensive Emergency Intubated 2/2 inability to protect airway  Sympathetic Hyperactivity  CT head with significant basal ganglia hemorrhage with midline shift and extension into the lateral and third ventricles with early entrapment of the right temporal horn.   Patient neurological status unfortunately without hope for neurological recovery with extent of bleed and unable to undergo surgery. Ongoing sympthetic hyperactivity. Long discussion with husband over the last two days about patient's status and condition. She is now DNR with supportive care until her brother can arrive then plan to transition to comfort although he understands she may pass prior to his arrival.   Per nursing, patient is donor and will need extubation in OR suite.   Labetalol, metoprolol prn and cleviprex gtt prn for Blood pressure goal systolic <160  LDL 186  HgbA1c 8.0  VTE prophylaxis - SCDs    Diet   Diet NPO time specified   Hypertension  Home meds: carvedilol 25 mg bid, HCTZ 12.5 mg qd, lisinopril 10 mg qd  BP now stable on cleviprex gtt, bp goal < 160 systolic    Diabetes type II   HgbA1c 8.0, goal < 7.0  CBGs Recent Labs    02/24/20 2331 02/25/20 0354 02/25/20 0816  GLUCAP 188* 243* 260*      SSI  Other Hospital Problems: Right arm swelling: per PCCM. Recommend cold compress and elevation for now. If phlebitis or DVT present would not qualify for anticoagulation with hemorrhage regardless.  Stroke MD Note :  I have personally obtained history,examined this patient, reviewed notes, independently viewed imaging studies, participated in medical decision making and plan of care.ROS completed by me personally and pertinent positives fully documented  I have made any additions or clarifications directly to the above note. Agree with note above. Patient neurological exam remains poor and she remains comatose and unresponsive.  She has extensive posturing to painful stimuli and fixed nonreactive pupils and absent corneal reflexes.  She has some respiratory effort about ventilator settings and trace cough and gag.  She continues to have autonomic storm with fluctuating blood pressure and heart rate.  Patient's husband is still awaiting arrival of family member prior to deciding on withdrawal of care hopefully in the next day or 2.. This patient is critically ill and at significant risk of neurological worsening, death and care requires constant monitoring of vital signs, hemodynamics,respiratory and cardiac monitoring, extensive review of multiple databases, frequent neurological assessment, discussion with family, other specialists and medical decision making of high complexity.I have made any additions or clarifications directly to the above note.This critical care time does not reflect procedure time, or teaching time or supervisory time of PA/NP/Med Resident etc but could involve care discussion time.  I spent 30 minutes of neurocritical care time  in the care  of  this patient.    Delia Heady, MD Medical Director [facility] Pager:  [phone] 02/25/2020 4:53 PM  To contact Stroke Continuity provider, please refer to [email or web address]. After hours, contact General Neurology

## 2020-02-26 DIAGNOSIS — J9601 Acute respiratory failure with hypoxia: Secondary | ICD-10-CM

## 2020-02-26 DIAGNOSIS — I619 Nontraumatic intracerebral hemorrhage, unspecified: Secondary | ICD-10-CM

## 2020-02-26 LAB — GLUCOSE, CAPILLARY
Glucose-Capillary: 134 mg/dL — ABNORMAL HIGH (ref 70–99)
Glucose-Capillary: 144 mg/dL — ABNORMAL HIGH (ref 70–99)
Glucose-Capillary: 147 mg/dL — ABNORMAL HIGH (ref 70–99)
Glucose-Capillary: 150 mg/dL — ABNORMAL HIGH (ref 70–99)
Glucose-Capillary: 152 mg/dL — ABNORMAL HIGH (ref 70–99)
Glucose-Capillary: 165 mg/dL — ABNORMAL HIGH (ref 70–99)

## 2020-02-26 LAB — SODIUM
Sodium: 157 mmol/L — ABNORMAL HIGH (ref 135–145)
Sodium: 157 mmol/L — ABNORMAL HIGH (ref 135–145)

## 2020-02-26 MED ORDER — SODIUM CHLORIDE 0.9 % IV BOLUS
500.0000 mL | Freq: Once | INTRAVENOUS | Status: AC
  Administered 2020-02-26: 500 mL via INTRAVENOUS

## 2020-02-26 MED ORDER — LACTATED RINGERS IV BOLUS
1000.0000 mL | Freq: Once | INTRAVENOUS | Status: AC
  Administered 2020-02-26: 1000 mL via INTRAVENOUS

## 2020-02-26 NOTE — Progress Notes (Addendum)
STROKE TEAM PROGRESS NOTE   INTERVAL HISTORY Husband not at bedside this morning. Very little movement with stimulation. Pupils dilated.and nonreactive bilaterally.  Absent corneals but does have some gag reflex and minimum respiratory effort. Husband not at bedside this morning.Marland Kitchen  Heart rate slowed down this morning.  Blood pressure seems stable.  Not much variation in heart rate today  Vitals:   02/26/20 0500 02/26/20 0600 02/26/20 0700 02/26/20 0826  BP: (!) 155/82 (!) 149/90 (!) 159/83   Pulse: 64 69 67 66  Resp: 20 20 20 20   Temp: (!) 100.9 F (38.3 C) (!) 101.5 F (38.6 C) (!) 101.7 F (38.7 C) (!) 101.3 F (38.5 C)  TempSrc:      SpO2: 100% 99% 98% 99%  Weight:      Height:       CBC:  Recent Labs  Lab 04-Mar-2020 2313 March 04, 2020 2318 03-04-20 2323 02/23/20 0030  WBC 8.5  --   --   --   NEUTROABS 3.8  --   --   --   HGB 14.2   < > 15.0 15.0  HCT 45.0   < > 44.0 44.0  MCV 86.5  --   --   --   PLT 183  --   --   --    < > = values in this interval not displayed.   Basic Metabolic Panel:  Recent Labs  Lab 03/04/2020 2313 03/04/2020 2313 03/04/2020 2318 04-Mar-2020 2318 Mar 04, 2020 2323 2020/03/04 2323 02/23/20 0030 02/23/20 0030 02/23/20 0816 02/23/20 1555 02/26/20 0014 02/26/20 0603  NA 138   < > 138   < > 138   < > 140   < > 144   < > 157* 157*  K 2.8*   < > 2.7*   < > 2.9*  --  2.8*  --   --   --   --   --   CL 99   < > 100  --  100  --   --   --   --   --   --   --   CO2 24  --   --   --   --   --   --   --   --   --   --   --   GLUCOSE 290*   < > 292*  --  294*  --   --   --   --   --   --   --   BUN 19   < > 21*  --  25*  --   --   --   --   --   --   --   CREATININE 1.19*   < > 1.10*  --  1.10*  --   --   --   --   --   --   --   CALCIUM 9.6  --   --   --   --   --   --   --   --   --   --   --   MG  --   --   --   --   --   --   --   --  1.9  --   --   --    < > = values in this interval not displayed.   Urine Drug Screen:  Recent Labs  Lab 03/04/20 2309   LABOPIA NONE DETECTED  COCAINSCRNUR NONE  DETECTED  LABBENZ NONE DETECTED  AMPHETMU NONE DETECTED  THCU NONE DETECTED  LABBARB NONE DETECTED    Alcohol Level  Recent Labs  Lab 02/06/2020 2309  ETH <10    IMAGING past 24 hours No results found.  PHYSICAL EXAM Constitution: on ventilator, comatose  HENT: [place]/AT Eyes: pupils 4-5 mm non-reactive, no eye movement Cardio: RRR, upper extremity swelling Neuro: Mental status: comatose and unresponsive. Cranial nerves: pupils nonreactive, negative corneal reflex, gag reflex intact no grimacing to painful stimuli. Motor: intermittent, small amount of extensor movement with painful stimuli to the extremities  Sensory: unable to assess Skin: c/d/i    ASSESSMENT/PLAN [name] is a 54 y.o. female with history of TIIDM, HNT, hemorrhagic stroke June 2021 with EVD placed presenting with recurrence and increased hemorrhage into left basal ganglia with midline shift and brain herniation.    Left Intraparenchymal Hemorrhagic stroke with brain herniation  Hypertensive Emergency Intubated 2/2 inability to protect airway  Sympathetic Hyperactivity  CT head with significant basal ganglia hemorrhage with midline shift and extension into the lateral and third ventricles with early entrapment of the right temporal horn.   Patient neurological status unfortunately without hope for neurological recovery with extent of bleed and unable to undergo surgery. Intermittent sympathetic hyperactivity. Patient is now DNR and awaiting other family arrival per husband's request. He is not at bedside this morning. Will reach out to him this afternoon to discuss further goals of care and planning.   Per nursing, patient is possible donor and will need extubation in OR suite.   Labetalol, metoprolol prn and cleviprex gtt prn for Blood pressure goal systolic <160  LDL 186  HgbA1c 8.0  VTE prophylaxis - SCDs    Diet   Diet NPO time specified    Hypertension  Home meds: carvedilol 25 mg bid, HCTZ 12.5 mg qd, lisinopril 10 mg qd  BP now stable on cleviprex gtt, bp goal < 160 systolic   Diabetes type II   LAGT3M 8.0, goal < 7.0  CBGs Recent Labs    02/25/20 2315 02/26/20 0328 02/26/20 0729  GLUCAP 166* 144* 152*      SSI  Other Hospital Problems: Right arm swelling: resolved  I have personally obtained history,examined this patient, reviewed notes, independently viewed imaging studies, participated in medical decision making and plan of care.ROS completed by me personally and pertinent positives fully documented  I have made any additions or clarifications directly to the above note. Agree with note above.  Patient neurological exam remains quite poor with barely preserved minimum brainstem function and patient's husband is struggling with decision to withdraw care.  Patient brother apparently arrived yesterday evening and will come and visit her today hopefully family will liberate her from ventilatory support soon.  No family available at the bedside during rounds for discussion.  Discussed with critical care MD This patient is critically ill and at significant risk of neurological worsening, death and care requires constant monitoring of vital signs, hemodynamics,respiratory and cardiac monitoring, extensive review of multiple databases, frequent neurological assessment, discussion with family, other specialists and medical decision making of high complexity.I have made any additions or clarifications directly to the above note.This critical care time does not reflect procedure time, or teaching time or supervisory time of PA/NP/Med Resident etc but could involve care discussion time.  I spent 30 minutes of neurocritical care time  in the care of  this patient.   Latoya Heady, MD   Latoya Heady, MD Medical Director [facility] Pager: [phone] 02/26/2020 1:05 PM  To contact Stroke Continuity  provider, please refer to [email or web address]. After hours, contact General Neurology

## 2020-02-26 NOTE — Progress Notes (Signed)
eLink Physician-Brief Progress Note Patient Name: Latoya Cox DOB: Sep 17, 1965 MRN: [id number]   Date of Service  02/26/2020  HPI/Events of Note  Oliguria.  eICU Interventions  NS 500 ml iv fluid bolus.        [name] [name] 02/26/2020, 3:26 AM

## 2020-02-26 NOTE — Progress Notes (Signed)
NAME:  Latoya Cox, MRN:  [id number], DOB:  Sep 16, 1965, LOS: 3 ADMISSION DATE:  02/07/2020, CONSULTATION DATE:  02/23/2020 REFERRING MD: Dr. Thomasena Edis, CHIEF COMPLAINT: Unresponsiveness  Brief History   Patient with a large left-sided ICH with extension to intraventricular hemorrhage and complicated with midline shift  Past Medical History   Past Medical History:  Diagnosis Date  . Arthritis   . Chest wall pain   . Diabetes mellitus without complication (HCC)   . Hypertension     Significant Hospital Events     Consults:  PCCM Neurosurgery  Procedures:  None  Significant Diagnostic Tests:  CT head 10/26-0547 IMPRESSION: 1. Interval increase in size of left basal ganglia hemorrhage now measuring 5.8 x 3.7 x 5.5 cm. 2. Increasing surrounding vasogenic edema. 3. Increasing mass effect with midline shift to 10 mm. 4. Progressive intraventricular hemorrhage and developing hydrocephalus with enlarging right lateral ventricle. 5. Stable aneurysm clip at the level of the left PICA. 6. Stable left frontal craniotomy.  Micro Data:  MRSA by PCR negative SARS negative Influenza negative  Antimicrobials:  None  Interim history/subjective:  Patient remains unresponsive, spoke with patient's husband he would like to proceed with palliative extubation by tomorrow morning when all family members arrive.  Patient's urine output has decreased significantly  Objective   Blood pressure (!) 182/93, pulse 64, temperature 100.2 F (37.9 C), resp. rate 20, height 5\' 2"  (1.575 m), weight 75.6 kg, SpO2 97 %.    Vent Mode: PRVC FiO2 (%):  [30 %-40 %] 30 % Set Rate:  [20 bmp] 20 bmp Vt Set:  [400 mL] 400 mL PEEP:  [5 cmH20] 5 cmH20 Plateau Pressure:  [11 cmH20-14 cmH20] 12 cmH20   Intake/Output Summary (Last 24 hours) at 02/26/2020 1441 Last data filed at 02/26/2020 1345 Gross per 24 hour  Intake 2908.44 ml  Output 905 ml  Net 2003.44 ml   Filed Weights   02/23/20 0120    Weight: 75.6 kg    Examination: General: Middle-age African-American female, lying in the bed, orally intubated HENT: Atraumatic, normocephalic.  Moist mucous membrane.  ETT/OGT in place Lungs: Clear breath sounds bilaterally, no wheezes or crackles Cardiovascular: S1-S2 appreciated, no murmur Abdomen: Soft, bowel sounds appreciated Neuro: Unresponsive, sedated Skin: No rash  Resolved Hospital Problem list     Assessment & Plan:  Large left basal ganglia hemorrhage with IVH, complicated with cerebral edema and midline shift ICH score of 4 which carries mortality of 97%  No acute surgical need per neurosurgery Patient has poor prognosis Palliative care discussion has been ongoing, now patient's husband wants to have palliative extubation tomorrow once all family member arrive events say goodbye to her  Acute respiratory failure due to hemorrhagic stroke Continue ventilator support VAP bundle in place  Hyperglycemia Continue SSI   Prognosis remains very poor  Goals of care discussions ongoing with family   Best practice:  Diet: N.p.o. Pain/Anxiety/Delirium protocol (if indicated): Continue fentanyl VAP protocol (if indicated): In place DVT prophylaxis:  GI prophylaxis: Protonix Glucose control: SSI Mobility: Bedrest Code Status: DNR Family Communication: Per primary Disposition: Intensive care unit  Labs   CBC: Recent Labs  Lab 01/31/2020 2313 02/17/2020 2318 02/15/2020 2323 02/23/20 0030  WBC 8.5  --   --   --   NEUTROABS 3.8  --   --   --   HGB 14.2 15.3* 15.0 15.0  HCT 45.0 45.0 44.0 44.0  MCV 86.5  --   --   --  PLT 183  --   --   --     Basic Metabolic Panel: Recent Labs  Lab 2020/03/14 2313 Mar 14, 2020 2313 03/14/2020 2318 03-14-2020 2318 2020-03-14 2323 14-Mar-2020 2323 02/23/20 0030 02/23/20 0030 02/23/20 5809 02/23/20 1555 02/24/20 1835 02/25/20 0711 02/25/20 1705 02/26/20 0014 02/26/20 0603  NA 138   < > 138   < > 138   < > 140   < > 144   < >  156* 154* 158* 157* 157*  K 2.8*  --  2.7*  --  2.9*  --  2.8*  --   --   --   --   --   --   --   --   CL 99  --  100  --  100  --   --   --   --   --   --   --   --   --   --   CO2 24  --   --   --   --   --   --   --   --   --   --   --   --   --   --   GLUCOSE 290*  --  292*  --  294*  --   --   --   --   --   --   --   --   --   --   BUN 19  --  21*  --  25*  --   --   --   --   --   --   --   --   --   --   CREATININE 1.19*  --  1.10*  --  1.10*  --   --   --   --   --   --   --   --   --   --   CALCIUM 9.6  --   --   --   --   --   --   --   --   --   --   --   --   --   --   MG  --   --   --   --   --   --   --   --  1.9  --   --   --   --   --   --    < > = values in this interval not displayed.   GFR: Estimated Creatinine Clearance: 57 mL/min (A) (by C-G formula based on SCr of 1.1 mg/dL (H)). Recent Labs  Lab 2020/03/14 2313  WBC 8.5    Liver Function Tests: Recent Labs  Lab Mar 14, 2020 2313  AST 20  ALT 14  ALKPHOS 67  BILITOT 1.2  PROT 7.4  ALBUMIN 4.4   No results for input(s): LIPASE, AMYLASE in the last 168 hours. No results for input(s): AMMONIA in the last 168 hours.  ABG    Component Value Date/Time   PHART 7.471 (H) 02/23/2020 0030   PCO2ART 43.6 02/23/2020 0030   PO2ART 204 (H) 02/23/2020 0030   HCO3 31.8 (H) 02/23/2020 0030   TCO2 33 (H) 02/23/2020 0030   O2SAT 100.0 02/23/2020 0030     Coagulation Profile: Recent Labs  Lab 03-14-20 2313  INR 1.0    Cardiac Enzymes: No results for input(s): CKTOTAL, CKMB, CKMBINDEX, TROPONINI in the last 168 hours.  HbA1C: Hgb A1c MFr  Bld  Date/Time Value Ref Range Status  01/01/2020 12:31 PM 8.0 (H) <5.7 % of total Hgb Final    Comment:    For someone without known diabetes, a hemoglobin A1c value of 6.5% or greater indicates that they may have  diabetes and this should be confirmed with a follow-up  test. . For someone with known diabetes, a value <7% indicates  that their diabetes is well controlled  and a value  greater than or equal to 7% indicates suboptimal  control. A1c targets should be individualized based on  duration of diabetes, age, comorbid conditions, and  other considerations. . Currently, no consensus exists regarding use of hemoglobin A1c for diagnosis of diabetes for children. Marland Kitchen   10/15/2019 07:49 PM 9.1 (H) 4.8 - 5.6 % Final    Comment:    (NOTE)         Prediabetes: 5.7 - 6.4         Diabetes: >6.4         Glycemic control for adults with diabetes: <7.0     CBG: Recent Labs  Lab 02/25/20 1929 02/25/20 2315 02/26/20 0328 02/26/20 0729 02/26/20 1139  GLUCAP 165* 166* 144* 152* 134*    Review of Systems:   Unobtainable  Past Medical History  She,  has a past medical history of Arthritis, Chest wall pain, Diabetes mellitus without complication (HCC), and Hypertension.   Surgical History    Past Surgical History:  Procedure Laterality Date  . aneurism repair    . lapband       Social History   reports that she has never smoked. She has never used smokeless tobacco. She reports that she does not drink alcohol and does not use drugs.   Family History   Her family history includes Arthritis in her father and mother; Diabetes in her father and mother; Hypertension in her father and mother; Stroke in her mother.   Allergies Allergies  Allergen Reactions  . Hydrocodone Itching    Pt states "if i take 2, it makes me itch"    Total critical care time: 30 minutes  Performed by: Cheri Fowler   Critical care time was exclusive of separately billable procedures and treating other patients.   Critical care was necessary to treat or prevent imminent or life-threatening deterioration.   Critical care was time spent personally by me on the following activities: development of treatment plan with patient and/or surrogate as well as nursing, discussions with consultants, evaluation of patient's response to treatment, examination of patient, obtaining  history from patient or surrogate, ordering and performing treatments and interventions, ordering and review of laboratory studies, ordering and review of radiographic studies, pulse oximetry and re-evaluation of patient's condition.   Cheri Fowler MD Critical care physician [facility]  Critical Care  Pager: [phone] Mobile: [phone]

## 2020-02-26 NOTE — Progress Notes (Signed)
Nutrition Brief Note  Chart reviewed. Pt triggered for assessment due to vent status Pt is DNR, likely plan for withdraw of care Discussed with CCM and no nutrition interventions warranted at this time.  Please re-consult if change in poc.   Romelle Starcher MS, RDN, LDN, CNSC Registered Dietitian III Clinical Nutrition RD Pager and On-Call Pager Number Located in [place]

## 2020-02-26 NOTE — Progress Notes (Signed)
SLP Cancellation Note  Patient Details Name: Phelicia Dantes MRN: [id number] DOB: 1965/12/20   Cancelled treatment:       Reason Eval/Treat Not Completed: Patient not medically ready. Will sign off, please reorder if needed.    Tiler Brandis, Riley Nearing 02/26/2020, 7:36 AM

## 2020-02-27 DIAGNOSIS — Z8679 Personal history of other diseases of the circulatory system: Secondary | ICD-10-CM

## 2020-02-27 DIAGNOSIS — E782 Mixed hyperlipidemia: Secondary | ICD-10-CM

## 2020-02-27 DIAGNOSIS — E876 Hypokalemia: Secondary | ICD-10-CM

## 2020-02-27 DIAGNOSIS — G936 Cerebral edema: Secondary | ICD-10-CM | POA: Diagnosis not present

## 2020-02-27 DIAGNOSIS — J988 Other specified respiratory disorders: Secondary | ICD-10-CM | POA: Diagnosis not present

## 2020-02-27 DIAGNOSIS — I160 Hypertensive urgency: Secondary | ICD-10-CM | POA: Diagnosis not present

## 2020-02-27 DIAGNOSIS — I619 Nontraumatic intracerebral hemorrhage, unspecified: Secondary | ICD-10-CM | POA: Diagnosis not present

## 2020-02-27 DIAGNOSIS — I161 Hypertensive emergency: Secondary | ICD-10-CM

## 2020-02-27 DIAGNOSIS — E1165 Type 2 diabetes mellitus with hyperglycemia: Secondary | ICD-10-CM

## 2020-02-27 LAB — GLUCOSE, CAPILLARY
Glucose-Capillary: 173 mg/dL — ABNORMAL HIGH (ref 70–99)
Glucose-Capillary: 158 mg/dL — ABNORMAL HIGH (ref 70–99)

## 2020-02-27 MED ORDER — MORPHINE 100MG IN NS 100ML (1MG/ML) PREMIX INFUSION
1.0000 mg/h | INTRAVENOUS | Status: DC
  Administered 2020-02-27: 1 mg/h via INTRAVENOUS
  Administered 2020-02-28: 4 mg/h via INTRAVENOUS
  Filled 2020-02-27: qty 100

## 2020-02-27 MED ORDER — POLYVINYL ALCOHOL 1.4 % OP SOLN: 1.0000 [drp] | Freq: Four times a day (QID) | OPHTHALMIC | Status: DC | PRN

## 2020-02-27 MED ORDER — ACETAMINOPHEN 650 MG RE SUPP: 650.0000 mg | Freq: Four times a day (QID) | RECTAL | Status: DC | PRN

## 2020-02-27 MED ORDER — SCOPOLAMINE 1 MG/3DAYS TD PT72: 1.0000 | MEDICATED_PATCH | TRANSDERMAL | Status: DC

## 2020-02-27 MED ORDER — ONDANSETRON HCL 4 MG/2ML IJ SOLN: 4.0000 mg | Freq: Four times a day (QID) | INTRAMUSCULAR | Status: DC | PRN

## 2020-02-27 MED ORDER — GLYCOPYRROLATE 0.2 MG/ML IJ SOLN
0.2000 mg | INTRAMUSCULAR | Status: DC | PRN
  Administered 2020-02-27 – 2020-02-28 (×2): 0.2 mg via INTRAVENOUS
  Filled 2020-02-27 (×2): qty 1

## 2020-02-27 MED ORDER — HALOPERIDOL LACTATE 2 MG/ML PO CONC
0.5000 mg | ORAL | Status: DC | PRN
  Filled 2020-02-27: qty 0.3

## 2020-02-27 MED ORDER — GLYCOPYRROLATE 0.2 MG/ML IJ SOLN: 0.2000 mg | INTRAMUSCULAR | Status: DC | PRN

## 2020-02-27 MED ORDER — BIOTENE DRY MOUTH MT LIQD: 15.0000 mL | OROMUCOSAL | Status: DC | PRN

## 2020-02-27 MED ORDER — HALOPERIDOL LACTATE 5 MG/ML IJ SOLN: 0.5000 mg | INTRAMUSCULAR | Status: DC | PRN

## 2020-02-27 MED ORDER — GLYCOPYRROLATE 1 MG PO TABS
1.0000 mg | ORAL_TABLET | ORAL | Status: DC | PRN
  Filled 2020-02-27: qty 1

## 2020-02-27 MED ORDER — ACETAMINOPHEN 325 MG PO TABS: 650.0000 mg | ORAL_TABLET | Freq: Four times a day (QID) | ORAL | Status: DC | PRN

## 2020-02-27 MED ORDER — ONDANSETRON 4 MG PO TBDP: 4.0000 mg | ORAL_TABLET | Freq: Four times a day (QID) | ORAL | Status: DC | PRN

## 2020-02-27 MED ORDER — MORPHINE BOLUS VIA INFUSION
4.0000 mg | Freq: Once | INTRAVENOUS | Status: AC
  Administered 2020-02-27: 4 mg via INTRAVENOUS
  Filled 2020-02-27: qty 4

## 2020-02-27 MED ORDER — HALOPERIDOL 0.5 MG PO TABS
0.5000 mg | ORAL_TABLET | ORAL | Status: DC | PRN
  Filled 2020-02-27: qty 1

## 2020-02-27 NOTE — Progress Notes (Addendum)
NAME:  Latoya Cox, MRN:  [id number], DOB:  [birth year], LOS: 4 ADMISSION DATE:  02/02/2020, CONSULTATION DATE:  02/23/2020 REFERRING MD: Dr. Thomasena Edis, CHIEF COMPLAINT: Unresponsiveness  Brief History   Patient with a large left-sided ICH with extension to intraventricular hemorrhage and complicated with midline shift  Past Medical History   Past Medical History:  Diagnosis Date  . Arthritis   . Chest wall pain   . Diabetes mellitus without complication (HCC)   . Hypertension     Significant Hospital Events    10/29: Palliative care discussion has been ongoing, now patient's husband wants to have palliative extubation  once all family member arrive events say goodbye to her  Consults:  PCCM Neurosurgery  Procedures:  None  Significant Diagnostic Tests:  CT head 10/26-0547 IMPRESSION: 1. Interval increase in size of left basal ganglia hemorrhage now measuring 5.8 x 3.7 x 5.5 cm. 2. Increasing surrounding vasogenic edema. 3. Increasing mass effect with midline shift to 10 mm. 4. Progressive intraventricular hemorrhage and developing hydrocephalus with enlarging right lateral ventricle. 5. Stable aneurysm clip at the level of the left PICA. 6. Stable left frontal craniotomy.  Micro Data:  MRSA by PCR negative SARS negative Influenza negative  Antimicrobials:  None  Interim history/subjective:  Remains unresponsive has gag only   Objective   Blood pressure 140/73, pulse (Abnormal) 58, temperature 99.1 F (37.3 C), resp. rate 15, height 5\' 2"  (1.575 m), weight 75.6 kg, SpO2 98 %.    Vent Mode: PRVC FiO2 (%):  [30 %] 30 % Set Rate:  [20 bmp] 20 bmp Vt Set:  [400 mL] 400 mL PEEP:  [5 cmH20] 5 cmH20 Plateau Pressure:  [9 cmH20-14 cmH20] 14 cmH20   Intake/Output Summary (Last 24 hours) at 02/27/2020 0928 Last data filed at 02/27/2020 0800 Gross per 24 hour  Intake 3738.07 ml  Output 3515 ml  Net 223.07 ml   Filed Weights   02/23/20 0120  Weight: 75.6 kg     Examination: General 54 yof unresponsive on vent  HENT NCAT no JVD MMM Pulm equal chest rise. No accessory use Card regular irreg abd soft gu min UOP Neuro unresponsive. Does have gag  Resolved Hospital Problem list     Assessment & Plan:  Large left basal ganglia hemorrhage with IVH, complicated with cerebral edema and midline shift ICH score of 4 which carries mortality of 97% Acute respiratory failure due to hemorrhagic stroke Hyperglycemia  Discussion No changes over night. Remains comatose. Husband shared w/ our staff desire to progress to palliative extubation once all family has chance to say goodbye to her   Plan Cont full vent support and current rx for now Dc all labs Cont fent Extubate when family ready    Best practice:  Diet: N.p.o. Pain/Anxiety/Delirium protocol (if indicated): Continue fentanyl VAP protocol (if indicated): In place DVT prophylaxis:  GI prophylaxis: Protonix Glucose control: SSI Mobility: Bedrest Code Status: DNR Family Communication: Per primary Disposition: Intensive care unit  My cct 32 min 02/25/20 ACNP-BC [facility] Pulmonary/Critical Care Pager # [phone] OR # [phone] if no answer  Attending section:  Unresponsive.  BP 135/77   Pulse (!) 58   Temp 99.7 F (37.6 C)   Resp 20   Ht 5\' 2"  (1.575 m)   Wt 75.6 kg   SpO2 98%   BMI 30.48 kg/m   ETT in place.  HR regular.  No wheeze.  Assessment: Large ICH with IVH. Compromised airway.  Plan: DNR Comfort measures  Discussed plan with patient's family at bedside and addressed their questions.  Coralyn Helling, MD [facility] Pulmonary/Critical Care Pager - [phone] 02/27/2020, 2:11 PM

## 2020-02-27 NOTE — Progress Notes (Signed)
STROKE TEAM PROGRESS NOTE   INTERVAL HISTORY Husband and other family members are at the bedside. Pt still intubated, unresponsive. Only has weak gag reflex but no other brainstem reflexes. No moving on pain stimulation. Family is discussing the timing for terminal extubation, will put on comfort care measures.  Vitals:   02/27/20 0408 02/27/20 0500 02/27/20 0600 02/27/20 0700  BP: 126/73 132/71 128/71 134/76  Pulse: 61 64 (!) 57   Resp: 20 20 20 20   Temp:  98.6 F (37 C) 98.6 F (37 C) 98.8 F (37.1 C)  TempSrc:      SpO2: 100% 100% 100%   Weight:      Height:       CBC:  Recent Labs  Lab 05-Mar-2020 2313 03-05-20 2318 03-05-20 2323 02/23/20 0030  WBC 8.5  --   --   --   NEUTROABS 3.8  --   --   --   HGB 14.2   < > 15.0 15.0  HCT 45.0   < > 44.0 44.0  MCV 86.5  --   --   --   PLT 183  --   --   --    < > = values in this interval not displayed.   Basic Metabolic Panel:  Recent Labs  Lab 05-Mar-2020 2313 03-05-2020 2313 03-05-20 2318 03-05-2020 2318 03-05-2020 2323 03-05-20 2323 02/23/20 0030 02/23/20 0030 02/23/20 0816 02/23/20 1555 02/26/20 0014 02/26/20 0603  NA 138   < > 138   < > 138   < > 140   < > 144   < > 157* 157*  K 2.8*   < > 2.7*   < > 2.9*  --  2.8*  --   --   --   --   --   CL 99   < > 100  --  100  --   --   --   --   --   --   --   CO2 24  --   --   --   --   --   --   --   --   --   --   --   GLUCOSE 290*   < > 292*  --  294*  --   --   --   --   --   --   --   BUN 19   < > 21*  --  25*  --   --   --   --   --   --   --   CREATININE 1.19*   < > 1.10*  --  1.10*  --   --   --   --   --   --   --   CALCIUM 9.6  --   --   --   --   --   --   --   --   --   --   --   MG  --   --   --   --   --   --   --   --  1.9  --   --   --    < > = values in this interval not displayed.   Urine Drug Screen:  Recent Labs  Lab 03/05/2020 2309  LABOPIA NONE DETECTED  COCAINSCRNUR NONE DETECTED  LABBENZ NONE DETECTED  AMPHETMU NONE DETECTED  THCU NONE DETECTED   LABBARB NONE DETECTED    Alcohol  Level  Recent Labs  Lab 22-Mar-2020 2309  ETH <10    IMAGING past 24 hours No results found.  PHYSICAL EXAM  Temp:  [98.6 F (37 C)-100.8 F (38.2 C)] 99.7 F (37.6 C) (10/30 1100) Pulse Rate:  [53-108] 58 (10/30 0846) Resp:  [15-21] 20 (10/30 1100) BP: (119-174)/(66-95) 135/77 (10/30 1100) SpO2:  [89 %-100 %] 89 % (10/30 1425) FiO2 (%):  [30 %] 30 % (10/30 0846)  General - Well nourished, well developed, intubated not on sedation.  Ophthalmologic - fundi not visualized due to noncooperation.  Cardiovascular - Regular rate and rhythm.  Neuro - intubated not on sedation, eyes closed, not following commands. With forced eye opening, eyes in mid position, not blinking to visual threat, doll's eyes absent, not tracking, pupil 5 mm bilaterally, nonreactive to light. Corneal reflex absent, gag reflex weakly present. Breathing not over the vent.  Facial symmetry not able to test due to ET tube.  Tongue protrusion not cooperative. On pain stimulation, no movement of all extremities. DTR 1+ and no babinski. Sensation, coordination and gait not tested.   ASSESSMENT/PLAN [name] is a 54 y.o. female with history of TIIDM, HNT, hemorrhagic stroke June 2021 with EVD placed presenting with recurrence and increased hemorrhage into left basal ganglia with midline shift and brain herniation.    Left BG ICH/IVH with brain herniation, likely due to hypertensive Emergency  CT head with significant basal ganglia hemorrhage with midline shift and extension into the lateral and third ventricles with early entrapment of the right temporal horn.   Intermittent sympathetic hyperactivity.   HgbA1c 8.0  UDS negative  VTE prophylaxis - SCDs  Patient neurological status unfortunately without hope for neurological recovery with extent of bleed and unable to undergo surgery.   Patient is now DNR and awaiting husband and other family's decision for terminal  extubation.  Husband denied organ donation  Respiratory failure  Intubated 2/2 inability to protect airway   On vent  CCM on board  Given extremely poor prognosis, family is discussing about terminal extubation today  History of ICH/IVH  09/2019, admitted for right-sided weakness, slurred speech, altered mental status and headache.  CT showed left thalamic ICH and IVH.  Status post EVD.  CT head and neck negative.  Carotid Doppler negative.  EF 60 to 65%.  LDL 186 and A1c 7.7.  Hypertension  Home meds: carvedilol 25 mg bid, HCTZ 12.5 mg qd, lisinopril 10 mg qd  BP now stable on cleviprex gtt  bp goal < 160 systolic   Diabetes type II   NOBS9G 8.0, goal < 7.0  CBGs  SSI  On Levemir  Other Hospital Problems:  Right arm swelling: resolved   History of left PICA aneurysm status post clip  Hypokalemia  Fever 101.7->101.3->100.6->afebrile  Oliguria   Hospital day #4  This patient is critically ill due to large ICH, IVH, midline shift, cerebral edema, fever and at significant risk of neurological worsening, death form cerebral edema, brain herniation, status epilepticus, obstructive hydrocephalus. This patient's care requires constant monitoring of vital signs, hemodynamics, respiratory and cardiac monitoring, review of multiple databases, neurological assessment, discussion with family, other specialists and medical decision making of high complexity. I spent 35 minutes of neurocritical care time in the care of this patient. I had long discussion with husband and other family members at bedside, updated pt current condition, treatment plan and potential prognosis, and answered all the questions.  They expressed understanding and appreciation.    Atiyana Welte  Roda Shutters, MD PhD Stroke Neurology 02/27/2020 11:00 AM   To contact Stroke Continuity provider, please refer to [email or web address]. After hours, contact General Neurology

## 2020-02-27 NOTE — Progress Notes (Signed)
Received patient with no belongings and no family at bedside.

## 2020-02-27 NOTE — Procedures (Signed)
Extubation Procedure Note  Patient Details:   Name: Latoya Cox DOB: Sep 13, 1965 MRN: [id number]   Airway Documentation:    Vent end date: 02/27/20 Vent end time: 1425   Pt extubated to RA per comfort care order.   Guss Bunde 02/27/2020, 2:26 PM

## 2020-02-28 DIAGNOSIS — I160 Hypertensive urgency: Secondary | ICD-10-CM | POA: Diagnosis not present

## 2020-02-28 DIAGNOSIS — I619 Nontraumatic intracerebral hemorrhage, unspecified: Secondary | ICD-10-CM | POA: Diagnosis not present

## 2020-02-29 LAB — POCT I-STAT, CHEM 8
BUN: 21 mg/dL — ABNORMAL HIGH (ref 6–20)
Calcium, Ion: 1.11 mmol/L — ABNORMAL LOW (ref 1.15–1.40)
Chloride: 100 mmol/L (ref 98–111)
Creatinine, Ser: 1.1 mg/dL — ABNORMAL HIGH (ref 0.44–1.00)
Glucose, Bld: 292 mg/dL — ABNORMAL HIGH (ref 70–99)
HCT: 45 % (ref 36.0–46.0)
Hemoglobin: 15.3 g/dL — ABNORMAL HIGH (ref 12.0–15.0)
Potassium: 2.7 mmol/L — CL (ref 3.5–5.1)
Sodium: 138 mmol/L (ref 135–145)
TCO2: 26 mmol/L (ref 22–32)

## 2020-02-29 NOTE — Progress Notes (Signed)
Patient  DNR and comfort care for end of life. Patient was noted with no respirations, no pulse and generalized cyanosis, expired today at 17:05. Patient's spouse was made aware, [facility] notified and physician was also notified and will fill out death certificate.

## 2020-02-29 NOTE — Plan of Care (Signed)
Pt passed away at 1705. Husband notified by RN. Death certificate signed, gave to front desk.   Marvel Plan, MD PhD Stroke Neurology 2020-03-15 5:55 PM

## 2020-02-29 NOTE — Death Summary Note (Addendum)
Death Summary    Patient ID: Latoya Cox   MRN: [id number]        DOB: Jan 30, 1966  Date of Admission: 03-03-2020 Date of Death: 03/09/20  Attending Physician:  Rosalin Hawking, MD, Stroke MD Consultant(s):   Treatment Team:  Earnie Larsson, MD  - Neurosurgery ;  Shellia Cleverly, MD - CCM ; Palliative Care Consult  Patient's PCP:  System, Provider Not In  DIAGNOSIS:  Active Problems:  Left BG ICH/IVH with brain herniation - Stroke, hemorrhagic ([place])  Right arm swelling: resolved   History of left PICA aneurysm status post clip  Hypokalemia   Fever  Oliguria  DM  Hypertension   Respiratory failure  CKD - stage 3a    Dyslipidemia  Oliguria    Past Medical History:  Diagnosis Date  . Arthritis   . Chest wall pain   . Diabetes mellitus without complication ([place])   . Hypertension    Past Surgical History:  Procedure Laterality Date  . aneurism repair    . lapband      Family History Family History  Problem Relation Age of Onset  . Stroke Mother   . Hypertension Mother   . Arthritis Mother   . Diabetes Mother   . Arthritis Father   . Hypertension Father   . Diabetes Father     Social History  reports that she has never smoked. She has never used smokeless tobacco. She reports that she does not drink alcohol and does not use drugs.    HOME MEDICATIONS PRIOR TO ADMISSION Medications Prior to Admission  Medication Sig Dispense Refill  . Ascorbic Acid (VITAMIN C) 100 MG tablet Take 100 mg by mouth daily.    Marland Kitchen atorvastatin (LIPITOR) 40 MG tablet Take 1 tablet (40 mg total) by mouth daily. 90 tablet 1  . carvedilol (COREG) 25 MG tablet Take 1 tablet (25 mg total) by mouth 2 (two) times daily with a meal. 60 tablet 5  . [employer] STOOL SOFTENER 100 MG capsule Take 1 capsule (100 mg total) by mouth daily. 90 capsule 1  . [employer] VITAMIN C 250 MG tablet Take 1 tablet (250 mg total) by mouth daily. Take 250 mg by mouth daily. 90 tablet 1  . fluticasone  (FLONASE) 50 MCG/ACT nasal spray Place 1 spray daily as needed into both nostrils for allergies. 16 g 11  . hydrochlorothiazide (MICROZIDE) 12.5 MG capsule Take 1 capsule (12.5 mg total) by mouth daily. 90 capsule 1  . insulin aspart (NOVOLOG) 100 UNIT/ML injection Sliding scale  CBG 70 - 120: 0 units  CBG 121 - 150: 1 unit,   CBG 151 - 200: 2 units,   CBG 201 - 250: 3 units,   CBG 251 - 300: 5 units,   CBG 301 - 350: 7 units,   CBG 351 - 400: 9 units    CBG > 400: 9 units and notify your MD 10 mL 1  . insulin detemir (LEVEMIR) 100 UNIT/ML injection Inject 0.13 mLs (13 Units total) into the skin 2 (two) times daily. 10 mL 1  . lisinopril (ZESTRIL) 10 MG tablet Take 1 tablet (10 mg total) by mouth daily. 90 tablet 1  . Multiple Vitamins-Minerals (MULTIVITAMIN ADULT) TABS Take 1 tablet by mouth daily.    Marland Kitchen nystatin-triamcinolone ointment (MYCOLOG) APPLY TO AFFECTED AREA TWICE DAILY (Patient taking differently: Apply 1 application topically 2 (two) times daily. ) 120 g 11  . omeprazole (PRILOSEC) 40 MG capsule TAKE  1 CAPSULE BY MOUTH EVERY DAY (Patient taking differently: Take 40 mg by mouth daily as needed (Acid Reflux). ) 90 capsule 1  . potassium chloride SA (KLOR-CON M20) 20 MEQ tablet Take 1 tablet (20 mEq total) by mouth daily. 90 tablet 1  . zolpidem (AMBIEN) 5 MG tablet Take 1 tablet (5 mg total) by mouth at bedtime as needed. 30 tablet 0  . Benzoyl Peroxide 10 % CREA Use on face twice daily (Patient not taking: Reported on 02/23/2020) 141 g 3  . blood glucose meter kit and supplies KIT Dispense based on patient and insurance preference. Use up to four times daily as directed. (FOR ICD-9 250.00, 250.01). 1 each 0  . CLEVER CHEK LANCETS MISC Use daily to check sugars. 100 each 11  . nystatin-triamcinolone (MYCOLOG II) cream Sparingly Topical Daily (Patient not taking: Reported on 02/23/2020) 30 g 1     HOSPITAL MEDICATIONS . scopolamine  1 patch Transdermal Q72H    LABORATORY  STUDIES CBC    Component Value Date/Time   WBC 8.5 02/13/2020 2313   RBC 5.20 (H) 02/20/2020 2313   HGB 15.0 02/23/2020 0030   HCT 44.0 02/23/2020 0030   PLT 183 02/21/2020 2313   MCV 86.5 01/30/2020 2313   MCH 27.3 02/27/2020 2313   MCHC 31.6 02/02/2020 2313   RDW 13.2 02/13/2020 2313   LYMPHSABS 3.9 02/16/2020 2313   MONOABS 0.7 02/23/2020 2313   EOSABS 0.1 02/04/2020 2313   BASOSABS 0.1 02/18/2020 2313   CMP    Component Value Date/Time   NA 157 (H) 02/26/2020 0603   K 2.8 (L) 02/23/2020 0030   CL 100 02/24/2020 2323   CO2 24 02/21/2020 2313   GLUCOSE 294 (H) 02/14/2020 2323   BUN 25 (H) 02/19/2020 2323   CREATININE 1.10 (H) 01/31/2020 2323   CREATININE 0.84 01/01/2020 1231   CALCIUM 9.6 02/03/2020 2313   PROT 7.4 02/08/2020 2313   ALBUMIN 4.4 02/02/2020 2313   AST 20 02/21/2020 2313   ALT 14 02/14/2020 2313   ALKPHOS 67 02/23/2020 2313   BILITOT 1.2 02/09/2020 2313   GFRNONAA 55 (L) 02/24/2020 2313   GFRAA >60 11/23/2019 0236   COAGS Lab Results  Component Value Date   INR 1.0 02/05/2020   INR 1.0 10/15/2019   INR 1.0 01/14/2019   Lipid Panel    Component Value Date/Time   CHOL 274 (H) 10/15/2019 1949   TRIG 91 02/23/2020 1555   HDL 64 10/15/2019 1949   CHOLHDL 4.3 10/15/2019 1949   VLDL 24 10/15/2019 1949   LDLCALC 186 (H) 10/15/2019 1949   HgbA1C  Lab Results  Component Value Date   HGBA1C 8.0 (H) 01/01/2020   Urinalysis    Component Value Date/Time   COLORURINE COLORLESS (A) 02/11/2020 2309   APPEARANCEUR CLEAR 02/18/2020 2309   LABSPEC 1.007 02/26/2020 2309   PHURINE 9.0 (H) 02/26/2020 2309   GLUCOSEU >=500 (A) 02/03/2020 2309   HGBUR NEGATIVE 02/04/2020 2309   BILIRUBINUR NEGATIVE 02/11/2020 2309   KETONESUR NEGATIVE 02/25/2020 2309   PROTEINUR 30 (A) 02/15/2020 2309   UROBILINOGEN 0.2 09/04/2014 1850   NITRITE NEGATIVE 02/19/2020 2309   LEUKOCYTESUR NEGATIVE 01/30/2020 2309   Urine Drug Screen     Component Value Date/Time    LABOPIA NONE DETECTED 02/18/2020 2309   COCAINSCRNUR NONE DETECTED 02/25/2020 2309   LABBENZ NONE DETECTED 02/01/2020 2309   AMPHETMU NONE DETECTED 01/30/2020 2309   THCU NONE DETECTED 02/16/2020 2309   LABBARB NONE DETECTED 02/01/2020  2309    Alcohol Level    Component Value Date/Time   [facility] <10 02/03/2020 2309     SIGNIFICANT DIAGNOSTIC STUDIES  CT HEAD WO CONTRAST  Result Date: 02/23/2020 CLINICAL DATA:  Hemorrhagic stroke. EXAM: CT HEAD WITHOUT CONTRAST TECHNIQUE: Contiguous axial images were obtained from the base of the skull through the vertex without intravenous contrast. COMPARISON:  CT head without contrast 02/01/2020 FINDINGS: Brain: Left basal ganglia hemorrhage has increased in size. Hemorrhage now measures 5.8 x 3.7 x 5.5 cm. Surrounding vasogenic edema is increasing. Intraventricular hemorrhages again noted. High-density blood products are now present within the lateral ventricles bilaterally as well as the third and fourth ventricles. Right lateral ventricle is enlarging. Hemorrhage extends into the brainstem. Midline shift to is increased from 9 mm to 10 mm. Foramen magnum is patent. No new hemorrhage is present. No cortical infarcts are present. Right basal ganglia are within normal limits. The brainstem and cerebellum are otherwise within normal limits. Vascular: Atherosclerotic calcifications are present in the cavernous internal carotid arteries bilaterally. Aneurysm clip at the level of the left PICA is stable. Skull: Left frontal craniotomy is again noted. Left frontal burr hole noted. Calvarium is otherwise within normal limits. No significant extracranial soft tissue lesion is present. Sinuses/Orbits: The paranasal sinuses and mastoid air cells are clear. The globes and orbits are within normal limits. IMPRESSION: 1. Interval increase in size of left basal ganglia hemorrhage now measuring 5.8 x 3.7 x 5.5 cm. 2. Increasing surrounding vasogenic edema. 3. Increasing mass  effect with midline shift to 10 mm. 4. Progressive intraventricular hemorrhage and developing hydrocephalus with enlarging right lateral ventricle. 5. Stable aneurysm clip at the level of the left PICA. 6. Stable left frontal craniotomy. Critical Value/emergent results were called by telephone at the time of interpretation on 02/23/2020 at 6:03 am to provider [facility] , who verbally acknowledged these results. Electronically Signed   By: San Morelle M.D.   On: 02/23/2020 06:03   DG Chest Portable 1 View  Result Date: 02/27/2020 CLINICAL DATA:  Intubation EXAM: PORTABLE CHEST 1 VIEW COMPARISON:  None. FINDINGS: The heart size and mediastinal contours are within normal limits. ETT is 2.8 cm above the level of the carina. NG tube is seen within the proximal stomach. No large airspace consolidation or pleural effusion. There is shallow degree of aeration with bibasilar subsegmental atelectasis. IMPRESSION: ETT and NG tube in satisfactory position. Bibasilar subsegmental atelectasis Electronically Signed   By: Prudencio Pair M.D.   On: 02/27/2020 23:59   DG Knee Complete 4 Views Right  Result Date: 01/01/2020 CLINICAL DATA:  Right knee pain EXAM: RIGHT KNEE - COMPLETE 4+ VIEW COMPARISON:  09/10/2016 FINDINGS: No fracture or malalignment. Mild degenerative change involving the lateral joint space. No large knee effusion. IMPRESSION: No acute osseous abnormality.  Mild lateral degenerative changes. Electronically Signed   By: Donavan Foil M.D.   On: 01/01/2020 23:41   DG Hand Complete Right  Result Date: 01/01/2020 CLINICAL DATA:  Right hand pain EXAM: RIGHT HAND - COMPLETE 3+ VIEW COMPARISON:  None. FINDINGS: Mineralization is within normal limits. No fracture or malalignment. Mild degenerative change at the first IP joint. IMPRESSION: Mild degenerative change at the first IP joint. Electronically Signed   By: Donavan Foil M.D.   On: 01/01/2020 23:42   CT HEAD CODE STROKE WO CONTRAST  Result  Date: 02/04/2020 CLINICAL DATA:  Code stroke.  Right-sided weakness EXAM: CT HEAD WITHOUT CONTRAST TECHNIQUE: Contiguous axial images were obtained  from the base of the skull through the vertex without intravenous contrast. COMPARISON:  None. FINDINGS: Brain: Intraparenchymal hematoma centered at the left basal ganglia measures 4.6 x 4.0 x 3.4 cm (volume = 33 cm^3). 4 mm of rightward midline shift. There is a large amount of intraventricular extension into the lateral and third ventricles. Early entrapment of the right temporal horn. Vascular: No abnormal hyperdensity of the major intracranial arteries or dural venous sinuses. No intracranial atherosclerosis. Skull: The visualized skull base, calvarium and extracranial soft tissues are normal. Sinuses/Orbits: No fluid levels or advanced mucosal thickening of the visualized paranasal sinuses. No mastoid or middle ear effusion. The orbits are normal. IMPRESSION: 1. Intraparenchymal hematoma centered at the left basal ganglia with intraventricular extension and 4 mm of rightward midline shift. 2. Early entrapment of the right temporal horn. Critical Value/emergent results were called by telephone at the time of interpretation on 02/01/2020 at 11:34 pm to provider [facility], who verbally acknowledged these results. Electronically Signed   By: Ulyses Jarred M.D.   On: 02/05/2020 23:35      HISTORY OF PRESENT ILLNESS (From  H&P Dr Theda Sers 02/05/2020) CC: acute loss of consciousness and brought as a code stroke. (History is obtained from: EMS and chart) HPI: Karel Mowers is a 54 y.o. female PMHx HTN, DM and recent intracranial hemorrhage in her usual state of health until about 2215 when her husband noted that she was having difficulty talking and not moving her right side.EMS arrived and noted nonverbal with right gaze and no right extremity movement. She was noted to have non-projectile emesis on arrival.  Chart notes long inpatient stay and rehabilitation  and ultimately discharged home. Baseline level of function is not clear. She does have some response to painful stimuli, but minimal. She did have spontaneous movement of her left side.  LKW: 2215 on 02-23-2020 NIHSS: 6  HOSPITAL COURSE Ms. Adelia Baptista is a 54 y.o. female with history of TIIDM, HNT, hemorrhagic stroke June 2021 with EVD placed presenting with recurrence and increased hemorrhage into left basal ganglia with midline shift and brain herniation.    Left BG ICH/IVH with brain herniation, likely due to hypertensive Emergency  CThead with significant basal ganglia hemorrhage with midline shift and extension into the lateral and third ventricles with early entrapment of the right temporal horn.   Intermittent sympathetic hyperactivity.   HgbA1c 8.0  UDS negative  Patient neurological status unfortunately without hope for neurological recovery   Patient is now on comfort care measures  Husband denied organ donation  Respiratory failure  Intubated 2/2 inability to protect airway -> terminally extubated 10/30  Given extremely poor prognosis, family decided terminal extubation and comfort care measures yesterday  History of ICH/IVH  09/2019, admitted for right-sided weakness, slurred speech, altered mental status and headache.  CT showed left thalamic ICH and IVH.  Status post EVD.  CT head and neck negative.  Carotid Doppler negative.  EF 60 to 65%.  LDL 186 and A1c 7.7.  Hypertension  Home meds:carvedilol 25 mg bid, HCTZ 12.5 mg qd, lisinopril 10 mg qd  Was treated with cleviprex gtt  Now on comfort care measures  Diabetes type II   HgbA1c 8.0, goal < 7.0  Palliative care  Pt terminally extubated to room air 02/27/20 at 1425   Comfort medications in place  Sats dropping  Other Hospital Problems:  Right arm swelling: resolved   History of left PICA aneurysm status post clip  Hypokalemia  Fever  101.7->101.3->100.6->afebrile  Oliguria    SUMMARY  The patient remained comatose on a ventilator with barely preserved minimal brainstem function. Both the Critical Care team as well as the Stroke Neurology team had discussions with the family conveying essentially no chance of any meaningful recovery. The patient's husband struggled with the decision to withdraw care. Her brother arrived from out of town and a decision was eventually made to proceed with comfort care measures only. Appropriate orders were put in place and the patient was extubated per the family's wishes on 02/27/20 at 2:25 PM. She was kept comfortable on a morphine drip. The patient passed away on March 11, 2020 at 1705. Dr Erlinda Hong and the family were notified.  20 minutes were spent preparing discharge.  Mikey Bussing PA-C [facility] Pager [phone] 02/29/2020, 5:18 PM

## 2020-02-29 NOTE — Progress Notes (Signed)
STROKE TEAM PROGRESS NOTE   INTERVAL HISTORY No family at bedside.  Patient lying in bed, comfortably, has increased respiratory rate but not in distress.  Still on morphine drip.  Vitals:   02/27/20 1000 02/27/20 1100 02/27/20 1425 2020-03-04 0544  BP: (!) 144/72 135/77  103/67  Pulse:    (!) 128  Resp: 20 20  19   Temp: 99.5 F (37.5 C) 99.7 F (37.6 C)  (!) 103 F (39.4 C)  TempSrc:    Axillary  SpO2:   (!) 89% (!) 75%  Weight:      Height:       CBC:  Recent Labs  Lab 02/27/2020 2313 01/31/2020 2318 02/03/2020 2323 02/23/20 0030  WBC 8.5  --   --   --   NEUTROABS 3.8  --   --   --   HGB 14.2   < > 15.0 15.0  HCT 45.0   < > 44.0 44.0  MCV 86.5  --   --   --   PLT 183  --   --   --    < > = values in this interval not displayed.   Basic Metabolic Panel:  Recent Labs  Lab 02/18/2020 2313 02/08/2020 2313 02/21/2020 2318 02/23/2020 2318 02/12/2020 2323 02/14/2020 2323 02/23/20 0030 02/23/20 0030 02/23/20 0816 02/23/20 1555 02/26/20 0014 02/26/20 0603  NA 138   < > 138   < > 138   < > 140   < > 144   < > 157* 157*  K 2.8*   < > 2.7*   < > 2.9*  --  2.8*  --   --   --   --   --   CL 99   < > 100  --  100  --   --   --   --   --   --   --   CO2 24  --   --   --   --   --   --   --   --   --   --   --   GLUCOSE 290*   < > 292*  --  294*  --   --   --   --   --   --   --   BUN 19   < > 21*  --  25*  --   --   --   --   --   --   --   CREATININE 1.19*   < > 1.10*  --  1.10*  --   --   --   --   --   --   --   CALCIUM 9.6  --   --   --   --   --   --   --   --   --   --   --   MG  --   --   --   --   --   --   --   --  1.9  --   --   --    < > = values in this interval not displayed.   Urine Drug Screen:  Recent Labs  Lab 02/21/2020 2309  LABOPIA NONE DETECTED  COCAINSCRNUR NONE DETECTED  LABBENZ NONE DETECTED  AMPHETMU NONE DETECTED  THCU NONE DETECTED  LABBARB NONE DETECTED    Alcohol Level  Recent Labs  Lab 02/21/2020 2309  ETH <10    IMAGING past  24 hours No results  found.  PHYSICAL EXAM   Temp:  [99 F (37.2 C)-103 F (39.4 C)] 103 F (39.4 C) (10/31 0544) Pulse Rate:  [58-128] 128 (10/31 0544) Resp:  [15-20] 19 (10/31 0544) BP: (103-144)/(67-77) 103/67 (10/31 0544) SpO2:  [75 %-100 %] 75 % (10/31 0544) FiO2 (%):  [30 %] 30 % (10/30 0846)  General - Well nourished, well developed, not in distress.  Ophthalmologic - fundi not visualized due to noncooperation.  Cardiovascular - Regular rate and rhythm.  Neuro - limited exam due to comfort care measures.  Patient not responsive, eyes closed, not open with voice.  Increased breathing rate, but not in distress.  Head turns to the left side, right facial nasolabial fold flattening.  Not moving all extremities spontaneously.   ASSESSMENT/PLAN [name] is a 54 y.o. female with history of TIIDM, HNT, hemorrhagic stroke June 2021 with EVD placed presenting with recurrence and increased hemorrhage into left basal ganglia with midline shift and brain herniation.    Left BG ICH/IVH with brain herniation, likely due to hypertensive Emergency  CT head with significant basal ganglia hemorrhage with midline shift and extension into the lateral and third ventricles with early entrapment of the right temporal horn.   Intermittent sympathetic hyperactivity.   HgbA1c 8.0  UDS negative  Patient neurological status unfortunately without hope for neurological recovery   Patient is now on comfort care measures  Husband denied organ donation  Respiratory failure  Intubated 2/2 inability to protect airway -> terminally extubated 10/30  Given extremely poor prognosis, family decided terminal extubation and comfort care measures yesterday  History of ICH/IVH  09/2019, admitted for right-sided weakness, slurred speech, altered mental status and headache.  CT showed left thalamic ICH and IVH.  Status post EVD.  CT head and neck negative.  Carotid Doppler negative.  EF 60 to 65%.  LDL 186 and A1c  7.7.  Hypertension  Home meds: carvedilol 25 mg bid, HCTZ 12.5 mg qd, lisinopril 10 mg qd  Was treated with cleviprex gtt  Now on comfort care measures  Diabetes type II   HgbA1c 8.0, goal < 7.0  Palliative care  Pt terminally extubated to room air 02/27/20 at 1425   Comfort medications in place  Sats dropping  Other Hospital Problems:  Right arm swelling: resolved   History of left PICA aneurysm status post clip  Hypokalemia  Fever 101.7->101.3->100.6->afebrile  Oliguria   Hospital day #5  Marvel Plan, MD PhD Stroke Neurology 2020-03-27 11:24 AM  To contact Stroke Continuity provider, please refer to [email or web address]. After hours, contact General Neurology

## 2020-02-29 DEATH — deceased

## 2020-03-14 ENCOUNTER — Ambulatory Visit: Payer: No Typology Code available for payment source | Admitting: Neurology

## 2020-03-23 ENCOUNTER — Other Ambulatory Visit: Payer: Self-pay | Admitting: Internal Medicine

## 2020-12-03 IMAGING — CT CT HEAD W/O CM
3 of 4 series · 13 of 47 positions shown, 15 images · non-contrast
Comparison: CT head dated 10/22/2019

CLINICAL DATA: History of intracranial hemorrhage.

EXAM:
CT HEAD WITHOUT CONTRAST
TECHNIQUE: Contiguous axial images were obtained from the base of the skull
through the vertex without intravenous contrast.

[Series 3: head without · axial · non-contrast · 0.47mm/px · z∈[-82,+38]mm · 7 of 32 slices shown, 9 images]
[im 4/32  brain]
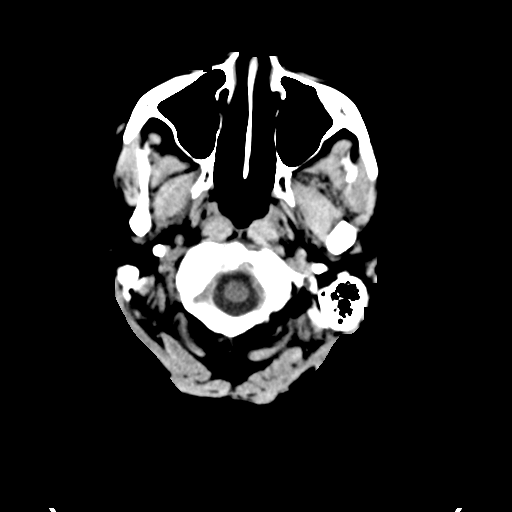
[im 4/32  bone]
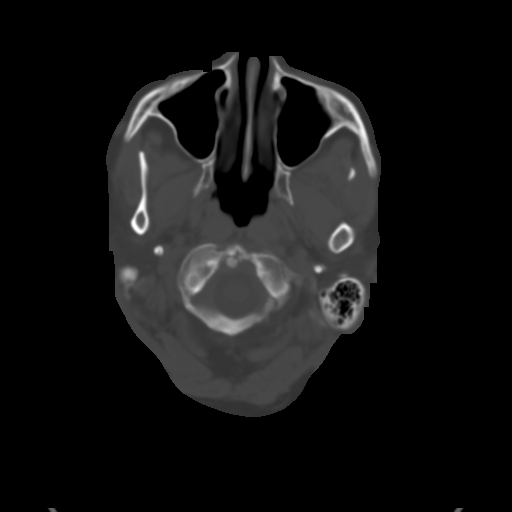
[im 8/32  brain]
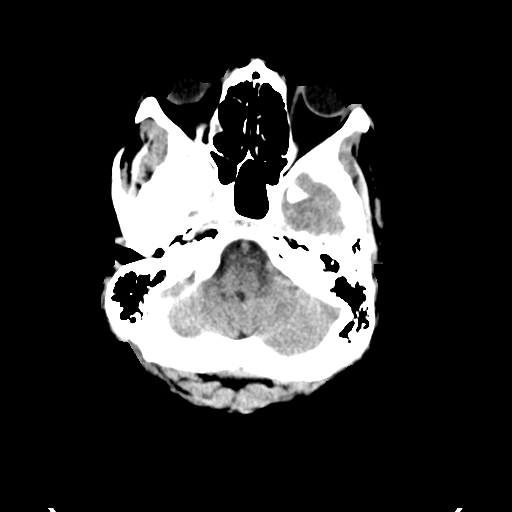
[im 12/32  brain]
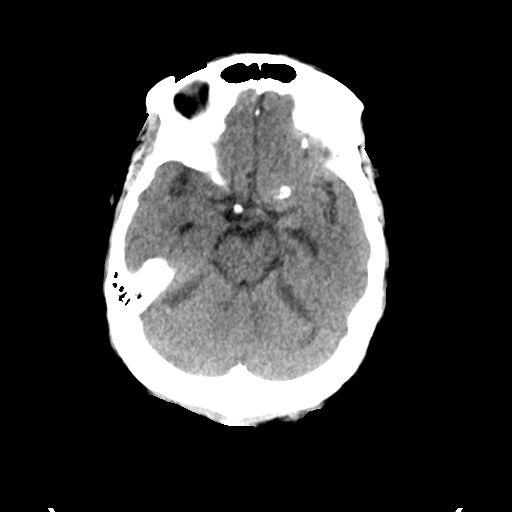
[im 16/32  brain]
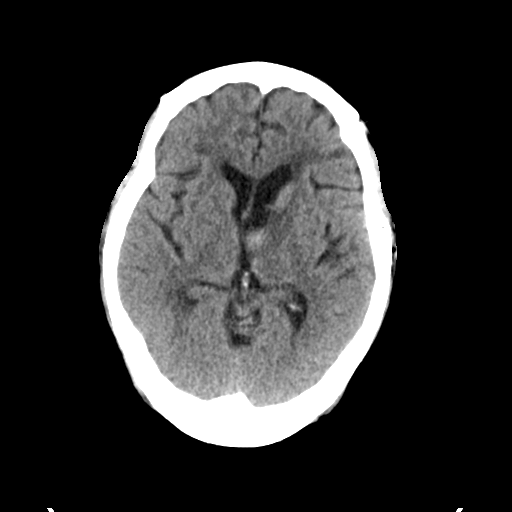
[im 20/32  brain]
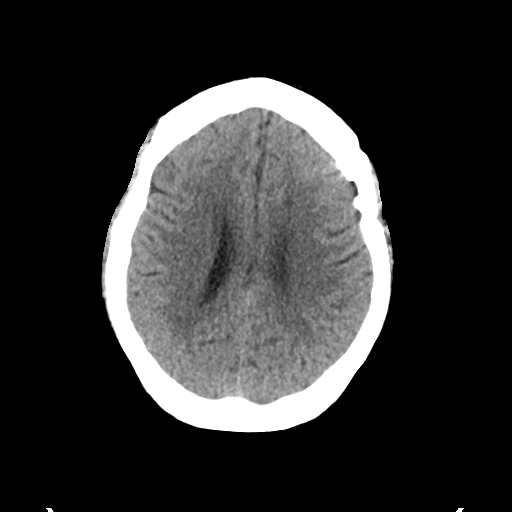
[im 20/32  bone]
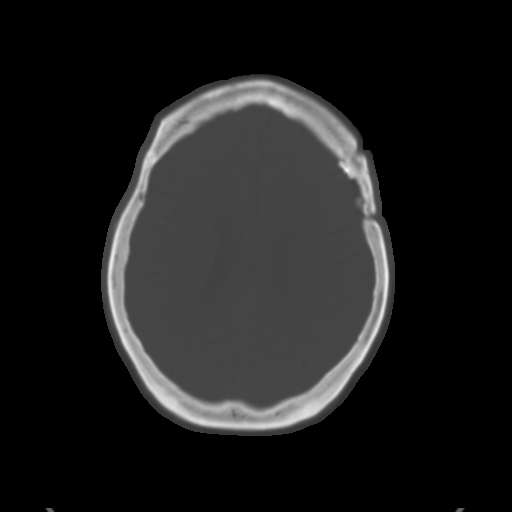
[im 24/32  brain]
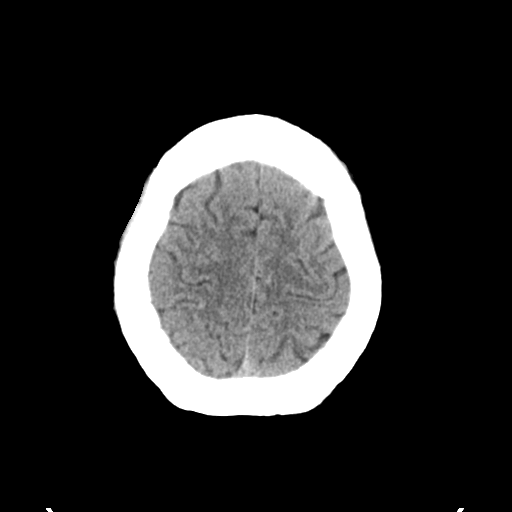
[im 28/32  brain]
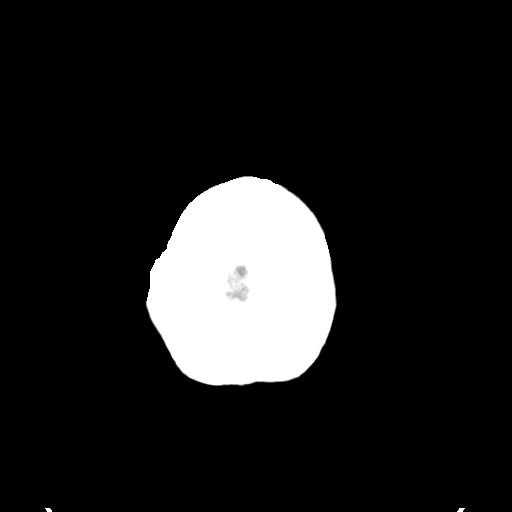

[Series 5: head without cor · coronal · non-contrast · 0.31mm/px · 3 of 71 slices shown]
[im 24/71  brain]
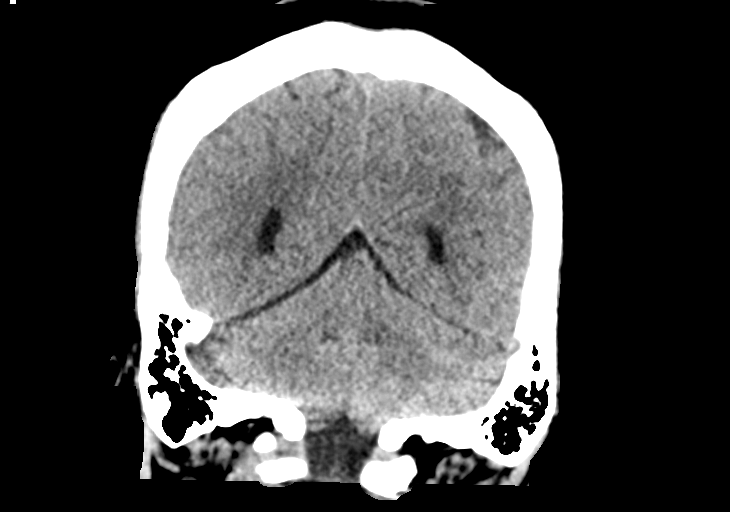
[im 32/71  brain]
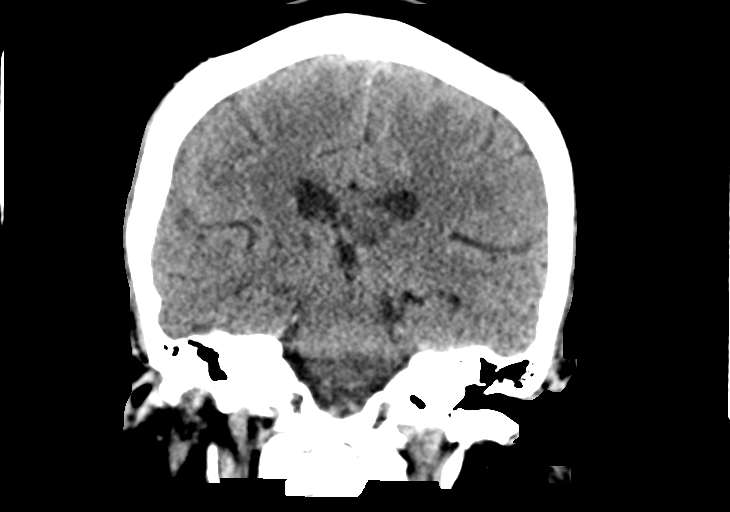
[im 39/71  brain]
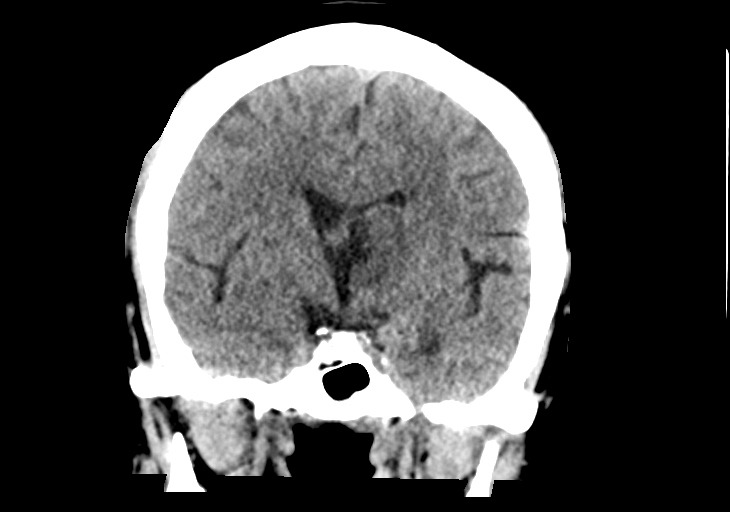

[Series 6: head without sag · sagittal · non-contrast · 0.31mm/px · 3 of 65 slices shown]
[im 22/65  brain]
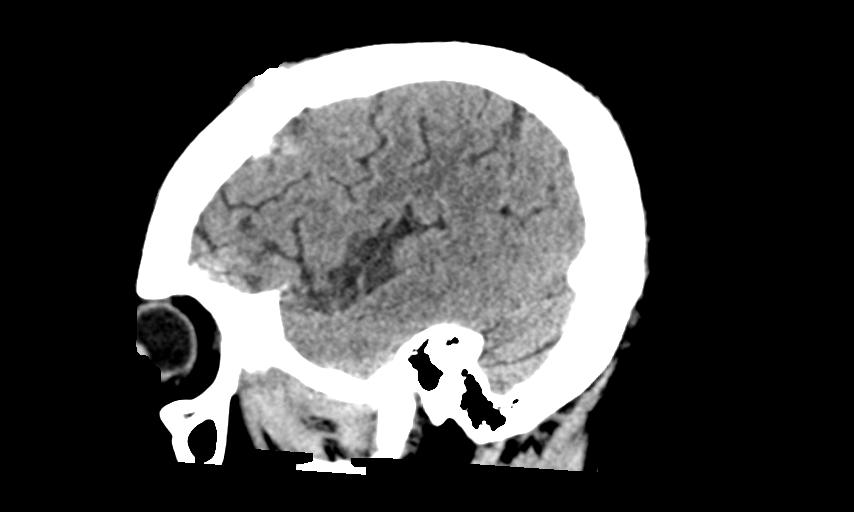
[im 33/65  brain]
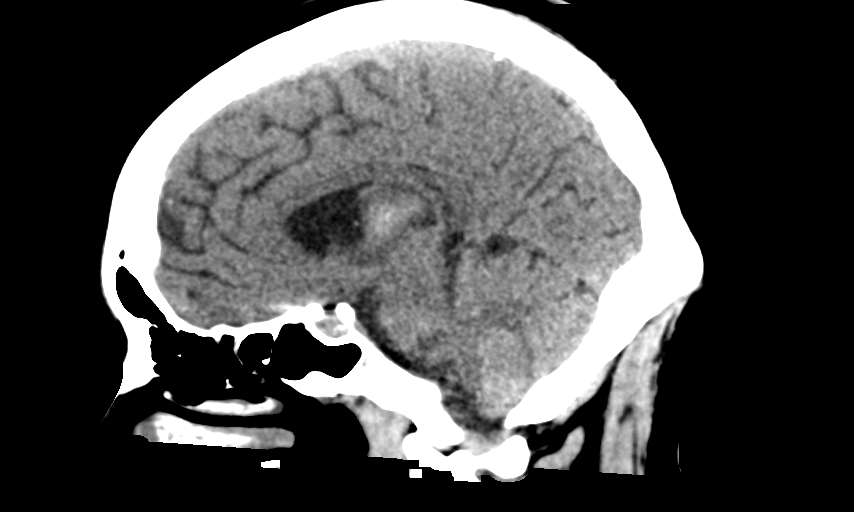
[im 43/65  brain]
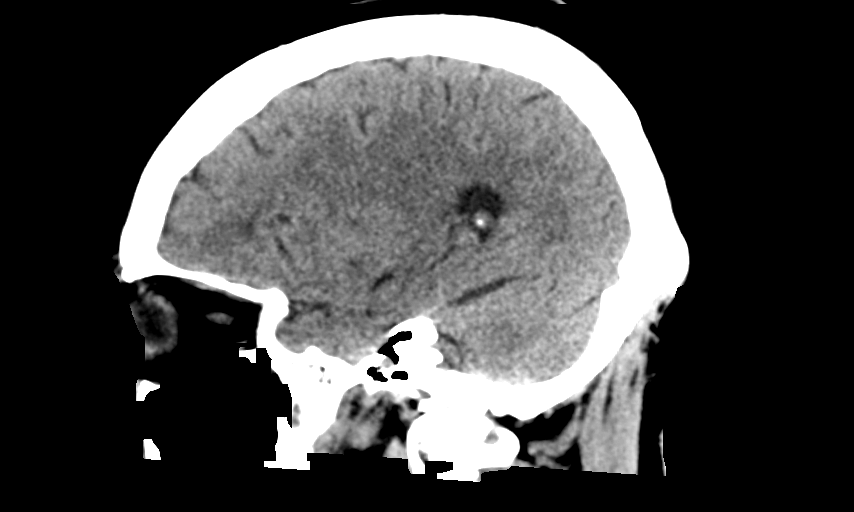

[13 of 47 positions shown; findings below may reference images not displayed]

FINDINGS: Brain: Intraparenchymal hemorrhage centered in the left medial basal
ganglia and thalamus has continued to decrease in size and density.
No new hemorrhage is identified. There is no hydrocephalus. The
basilar cisterns are patent and there is no significant midline
shift. Mild hypoattenuation is seen along the tract of the prior
left ventricular catheter. Subdural hygromas along the tentorium are
unchanged.

Vascular: An aneurysm clip is redemonstrated.

Skull: A left frontal burr hole and prior left craniotomy are
redemonstrated.

Sinuses/Orbits: No acute finding.

Other: None.
IMPRESSION: Continued to decrease in size and density of the left basal ganglia
and thalamic hemorrhage. No new hemorrhage or hydrocephalus.
